# Patient Record
Sex: Female | Born: 1960
Health system: Southern US, Community
[De-identification: ages and names within clinical notes are randomized; demographics above are authoritative.]

## PROBLEM LIST (undated history)

## (undated) ENCOUNTER — Ambulatory Visit: Payer: Medicaid Other

## (undated) DIAGNOSIS — R569 Unspecified convulsions: Secondary | ICD-10-CM

## (undated) DIAGNOSIS — R609 Edema, unspecified: Secondary | ICD-10-CM

## (undated) DIAGNOSIS — I10 Essential (primary) hypertension: Secondary | ICD-10-CM

## (undated) DIAGNOSIS — E119 Type 2 diabetes mellitus without complications: Secondary | ICD-10-CM

## (undated) HISTORY — DX: Type 2 diabetes mellitus without complications: E11.9

## (undated) HISTORY — PX: BRAIN SURGERY: SHX531

## (undated) HISTORY — DX: Essential (primary) hypertension: I10

## (undated) HISTORY — DX: Edema, unspecified: R60.9

---

## 2018-01-14 ENCOUNTER — Encounter: Payer: Self-pay | Admitting: Family Medicine

## 2018-01-14 ENCOUNTER — Ambulatory Visit (INDEPENDENT_AMBULATORY_CARE_PROVIDER_SITE_OTHER): Payer: Self-pay | Admitting: Family Medicine

## 2018-01-14 VITALS — BP 138/76 | HR 80 | Temp 98.8°F | Ht 61.0 in | Wt 139.0 lb

## 2018-01-14 DIAGNOSIS — Z0000 Encounter for general adult medical examination without abnormal findings: Secondary | ICD-10-CM

## 2018-01-14 DIAGNOSIS — R600 Localized edema: Secondary | ICD-10-CM

## 2018-01-14 DIAGNOSIS — E1169 Type 2 diabetes mellitus with other specified complication: Secondary | ICD-10-CM

## 2018-01-14 DIAGNOSIS — Z794 Long term (current) use of insulin: Secondary | ICD-10-CM

## 2018-01-14 DIAGNOSIS — Z131 Encounter for screening for diabetes mellitus: Secondary | ICD-10-CM

## 2018-01-14 DIAGNOSIS — Z09 Encounter for follow-up examination after completed treatment for conditions other than malignant neoplasm: Secondary | ICD-10-CM

## 2018-01-14 DIAGNOSIS — R7309 Other abnormal glucose: Secondary | ICD-10-CM

## 2018-01-14 DIAGNOSIS — I10 Essential (primary) hypertension: Secondary | ICD-10-CM

## 2018-01-14 LAB — POCT URINALYSIS DIP (MANUAL ENTRY)
Bilirubin, UA: NEGATIVE
Glucose, UA: 500 mg/dL — AB
Ketones, POC UA: NEGATIVE mg/dL
Leukocytes, UA: NEGATIVE
Nitrite, UA: NEGATIVE
Protein Ur, POC: >=300 mg/dL — AB
Spec Grav, UA: 1.015 (ref 1.010–1.025)
Urobilinogen, UA: 0.2 E.U./dL
pH, UA: 7.5 (ref 5.0–8.0)

## 2018-01-14 LAB — GLUCOSE, POCT (MANUAL RESULT ENTRY)
POC Glucose: 478 mg/dl — AB (ref 70–99)
POC Glucose: 494 mg/dl — AB (ref 70–99)

## 2018-01-14 LAB — POCT GLYCOSYLATED HEMOGLOBIN (HGB A1C): Hemoglobin A1C: 14.3 % — AB (ref 4.0–5.6)

## 2018-01-14 MED ORDER — BLOOD GLUCOSE METER KIT: PACK | 0 refills | Status: DC

## 2018-01-14 MED ORDER — LISINOPRIL 10 MG PO TABS: 10.0000 mg | ORAL_TABLET | Freq: Every day | ORAL | 3 refills | Status: DC

## 2018-01-14 MED ORDER — INSULIN ASPART 100 UNIT/ML ~~LOC~~ SOLN: 15.0000 [IU] | Freq: Three times a day (TID) | SUBCUTANEOUS | 11 refills | Status: DC

## 2018-01-14 MED ORDER — CLONIDINE HCL 0.1 MG PO TABS
0.2000 mg | ORAL_TABLET | Freq: Once | ORAL | Status: AC
  Administered 2018-01-14: 0.2 mg via ORAL

## 2018-01-14 MED ORDER — GLIPIZIDE 10 MG PO TABS: 10.0000 mg | ORAL_TABLET | Freq: Two times a day (BID) | ORAL | 3 refills | Status: DC

## 2018-01-14 MED ORDER — AMLODIPINE BESYLATE 10 MG PO TABS: 10.0000 mg | ORAL_TABLET | Freq: Every day | ORAL | 3 refills | Status: DC

## 2018-01-14 MED ORDER — METFORMIN HCL 1000 MG PO TABS: 1000.0000 mg | ORAL_TABLET | Freq: Two times a day (BID) | ORAL | 3 refills | Status: DC

## 2018-01-14 MED ORDER — HYDROCHLOROTHIAZIDE 25 MG PO TABS: 25.0000 mg | ORAL_TABLET | Freq: Every day | ORAL | 3 refills | Status: DC

## 2018-01-14 MED ORDER — INSULIN LISPRO 100 UNIT/ML ~~LOC~~ SOLN
20.0000 [IU] | Freq: Once | SUBCUTANEOUS | Status: AC
  Administered 2018-01-14: 20 [IU] via SUBCUTANEOUS

## 2018-01-14 MED ORDER — INSULIN GLARGINE 100 UNIT/ML ~~LOC~~ SOLN: 50.0000 [IU] | Freq: Every day | SUBCUTANEOUS | 11 refills | Status: DC

## 2018-01-14 NOTE — Progress Notes (Signed)
New Patient--Establish Care  Subjective:    Patient ID: Jill Jefferson, female    DOB: 1960-06-24, 58 y.o.   MRN: [id number]  Chief Complaint  Patient presents with  . Establish Care  . Foot Swelling    bilateral    HPI  Jill Jefferson is a 58 year old female with a past medical history of Hypertension and Diabetes.   She is here today to establish care.    Current Status: Since her last office visit, she is doing well with no complaints. She states that she has recently relocated to [facility] from [facility] to take care of her elderly parents.   She states that she was previously taking medications for Diabetes and Hypertension 2 years ago, but discontinued taking because it was not working. She also states that she takes vitamins daily and eats healthy. She denies visual changes, chest pain, cough, shortness of breath, heart palpitations, and falls. She has occasionally headaches and dizziness with position changes. Denies severe headaches, confusion, seizures, double vision, and blurred vision, nausea and vomiting. She admits to frequent urination. She denies fatigue, excessive hunger, excessive thirst, weight gain, weight loss, and poor wound healing.   She denies fevers, chills, recent infections, weight loss, and night sweats. No reports of GI problems such as diarrhea, and constipation. She has no reports of blood in stools, dysuria and hematuria. No depression or anxiety reported. She denies pain today.   Review of Systems  Constitutional: Negative.   HENT: Negative.   Eyes: Positive for visual disturbance.  Respiratory: Negative.   Cardiovascular: Negative.   Gastrointestinal: Negative.   Endocrine: Positive for polyuria.  Genitourinary: Negative.   Musculoskeletal: Negative.   Skin: Negative.   Allergic/Immunologic: Negative.   Neurological: Positive for dizziness and headaches.  Hematological: Negative.   Psychiatric/Behavioral: Negative.    Objective:   Physical Exam Vitals signs  and nursing note reviewed.  Constitutional:      Appearance: Normal appearance. She is normal weight.  HENT:     Head: Normocephalic and atraumatic.     Nose: Nose normal.     Mouth/Throat:     Mouth: Mucous membranes are moist.     Pharynx: Oropharynx is clear.  Eyes:     Conjunctiva/sclera: Conjunctivae normal.  Neck:     Musculoskeletal: Neck supple.  Cardiovascular:     Rate and Rhythm: Normal rate and regular rhythm.     Pulses: Normal pulses.     Heart sounds: Normal heart sounds.  Pulmonary:     Effort: Pulmonary effort is normal.     Breath sounds: Normal breath sounds.  Abdominal:     General: Bowel sounds are normal.     Palpations: Abdomen is soft.  Musculoskeletal: Normal range of motion.     Right lower leg: Edema (1+) present.  Skin:    General: Skin is warm and dry.     Capillary Refill: Capillary refill takes less than 2 seconds.  Neurological:     General: No focal deficit present.     Mental Status: She is alert and oriented to person, place, and time.  Psychiatric:        Mood and Affect: Mood normal.        Behavior: Behavior normal.        Thought Content: Thought content normal.        Judgment: Judgment normal.    Assessment & Plan:   1. Hypertension, unspecified type Blood pressure is elevated today. Clonidine 0.3 mg given in  office today in effort to decrease blood pressure. We will initiate Lisinopril, HCTZ and Norvasc today. She will continue to decrease high sodium intake, excessive alcohol intake, increase potassium intake, smoking cessation, and increase physical activity of at least 30 minutes of cardio activity daily. She will continue to follow Heart Healthy or DASH diet. - cloNIDine (CATAPRES) tablet 0.2 mg  2. Type 2 diabetes mellitus with other specified complication, with long-term current use of insulin (HCC) We will initiate Lantus 50 units QHS, and Novolog 15 units with meals and QHS. Initiate Metformin and Glucotrol today.  She  will continue to decrease foods/beverages high in sugars and carbs and follow Heart Healthy or DASH diet. Increase physical activity to at least 30 minutes cardio exercise daily.   3. Bilateral lower extremity edema Stable. Not worsening. 1+ left leg, mild in right leg. We will initiate HCTZ today. She will continue OTC Potassium daily.   4. Elevated glucose Glucose elevated at 478 today. 20 units of Humalog given to patient in office today.  - POCT glucose (manual entry) - insulin lispro (HUMALOG) injection 20 Units - POCT glucose (manual entry)  5. Screening for diabetes mellitus Hgb A1c is increased at 14.3 today. We will initiate Insulin, Metformin, and Glucotrol as prescribed. She will monitor blood glucose levels before meals and QHS. She will continue to decrease foods/beverages high in sugars and carbs and follow Heart Healthy or DASH diet. Increase physical activity to at least 30 minutes cardio exercise daily.  - POCT glycosylated hemoglobin (Hb A1C) - POCT urinalysis dipstick  6. Healthcare maintenance - CBC with Differential - Comprehensive metabolic panel; Future - Lipid Panel - TSH - Vitamin D, 25-hydroxy - Vitamin B12 - Comprehensive metabolic panel  7. Follow up She will follow up in 1 week for assessment and compliance of Hypertension and Diabetes.   Meds ordered this encounter  Medications  . cloNIDine (CATAPRES) tablet 0.2 mg  . insulin lispro (HUMALOG) injection 20 Units  . insulin glargine (LANTUS) 100 UNIT/ML injection    Sig: Inject 0.5 mLs (50 Units total) into the skin at bedtime.    Dispense:  10 mL    Refill:  11  . metFORMIN (GLUCOPHAGE) 1000 MG tablet    Sig: Take 1 tablet (1,000 mg total) by mouth 2 (two) times daily with a meal.    Dispense:  180 tablet    Refill:  3  . glipiZIDE (GLUCOTROL) 10 MG tablet    Sig: Take 1 tablet (10 mg total) by mouth 2 (two) times daily before a meal.    Dispense:  60 tablet    Refill:  3  . insulin aspart  (NOVOLOG) 100 UNIT/ML injection    Sig: Inject 15 Units into the skin 3 (three) times daily before meals. 15 units for blood glucose levels greater than 125.    Dispense:  10 mL    Refill:  11  . lisinopril (PRINIVIL,ZESTRIL) 10 MG tablet    Sig: Take 1 tablet (10 mg total) by mouth daily.    Dispense:  30 tablet    Refill:  3  . amLODipine (NORVASC) 10 MG tablet    Sig: Take 1 tablet (10 mg total) by mouth daily.    Dispense:  30 tablet    Refill:  3  . blood glucose meter kit and supplies    Sig: Dispense based on patient and insurance preference. Use up to four times daily as directed. (FOR ICD-10 E10.9, E11.9).    Dispense:  1 each    Refill:  0    Order Specific Question:   Number of strips    Answer:   100    Order Specific Question:   Number of lancets    Answer:   100  . hydrochlorothiazide (HYDRODIURIL) 25 MG tablet    Sig: Take 1 tablet (25 mg total) by mouth daily.    Dispense:  30 tablet    Refill:  [place],  MSN, FNP-C [facility] [address] [place], [place] [address] [phone]

## 2018-01-14 NOTE — Patient Instructions (Signed)
Heart-Healthy Eating Plan Heart-healthy meal planning includes:  Eating less unhealthy fats.  Eating more healthy fats.  Making other changes in your diet. Talk with your doctor or a diet specialist (dietitian) to create an eating plan that is right for you. What is my plan? Your doctor may recommend an eating plan that includes:  Total fat: ______% or less of total calories a day.  Saturated fat: ______% or less of total calories a day.  Cholesterol: less than _________mg a day. What are tips for following this plan? Cooking Avoid frying your food. Try to bake, boil, grill, or broil it instead. You can also reduce fat by:  Removing the skin from poultry.  Removing all visible fats from meats.  Steaming vegetables in water or broth. Meal planning   At meals, divide your plate into four equal parts: ? Fill one-half of your plate with vegetables and green salads. ? Fill one-fourth of your plate with whole grains. ? Fill one-fourth of your plate with lean protein foods.  Eat 4-5 servings of vegetables per day. A serving of vegetables is: ? 1 cup of raw or cooked vegetables. ? 2 cups of raw leafy greens.  Eat 4-5 servings of fruit per day. A serving of fruit is: ? 1 medium whole fruit. ?  cup of dried fruit. ?  cup of fresh, frozen, or canned fruit. ?  cup of 100% fruit juice.  Eat more foods that have soluble fiber. These are apples, broccoli, carrots, beans, peas, and barley. Try to get 20-30 g of fiber per day.  Eat 4-5 servings of nuts, legumes, and seeds per week: ? 1 serving of dried beans or legumes equals  cup after being cooked. ? 1 serving of nuts is  cup. ? 1 serving of seeds equals 1 tablespoon. General information  Eat more home-cooked food. Eat less restaurant, buffet, and fast food.  Limit or avoid alcohol.  Limit foods that are high in starch and sugar.  Avoid fried foods.  Lose weight if you are overweight.  Keep track of how much salt  (sodium) you eat. This is important if you have high blood pressure. Ask your doctor to tell you more about this.  Try to add vegetarian meals each week. Fats  Choose healthy fats. These include olive oil and canola oil, flaxseeds, walnuts, almonds, and seeds.  Eat more omega-3 fats. These include salmon, mackerel, sardines, tuna, flaxseed oil, and ground flaxseeds. Try to eat fish at least 2 times each week.  Check food labels. Avoid foods with trans fats or high amounts of saturated fat.  Limit saturated fats. ? These are often found in animal products, such as meats, butter, and cream. ? These are also found in plant foods, such as palm oil, palm kernel oil, and coconut oil.  Avoid foods with partially hydrogenated oils in them. These have trans fats. Examples are stick margarine, some tub margarines, cookies, crackers, and other baked goods. What foods can I eat? Fruits All fresh, canned (in natural juice), or frozen fruits. Vegetables Fresh or frozen vegetables (raw, steamed, roasted, or grilled). Green salads. Grains Most grains. Choose whole wheat and whole grains most of the time. Rice and pasta, including brown rice and pastas made with whole wheat. Meats and other proteins Lean, well-trimmed beef, veal, pork, and lamb. Chicken and [place] without skin. All fish and shellfish. Wild duck, rabbit, pheasant, and venison. Egg whites or low-cholesterol egg substitutes. Dried beans, peas, lentils, and tofu. Seeds and most  nuts. Dairy Low-fat or nonfat cheeses, including ricotta and mozzarella. Skim or 1% milk that is liquid, powdered, or evaporated. Buttermilk that is made with low-fat milk. Nonfat or low-fat yogurt. Fats and oils Non-hydrogenated (trans-free) margarines. Vegetable oils, including soybean, sesame, sunflower, olive, peanut, safflower, corn, canola, and cottonseed. Salad dressings or mayonnaise made with a vegetable oil. Beverages Mineral water. Coffee and tea. Diet  carbonated beverages. Sweets and desserts Sherbet, gelatin, and fruit ice. Small amounts of dark chocolate. Limit all sweets and desserts. Seasonings and condiments All seasonings and condiments. The items listed above may not be a complete list of foods and drinks you can eat. Contact a dietitian for more options. What foods should I avoid? Fruits Canned fruit in heavy syrup. Fruit in cream or butter sauce. Fried fruit. Limit coconut. Vegetables Vegetables cooked in cheese, cream, or butter sauce. Fried vegetables. Grains Breads that are made with saturated or trans fats, oils, or whole milk. Croissants. Sweet rolls. Donuts. High-fat crackers, such as cheese crackers. Meats and other proteins Fatty meats, such as hot dogs, ribs, sausage, bacon, rib-eye roast or steak. High-fat deli meats, such as salami and bologna. Caviar. Domestic duck and goose. Organ meats, such as liver. Dairy Cream, sour cream, cream cheese, and creamed cottage cheese. Whole-milk cheeses. Whole or 2% milk that is liquid, evaporated, or condensed. Whole buttermilk. Cream sauce or high-fat cheese sauce. Yogurt that is made from whole milk. Fats and oils Meat fat, or shortening. Cocoa butter, hydrogenated oils, palm oil, coconut oil, palm kernel oil. Solid fats and shortenings, including bacon fat, salt pork, lard, and butter. Nondairy cream substitutes. Salad dressings with cheese or sour cream. Beverages Regular sodas and juice drinks with added sugar. Sweets and desserts Frosting. Pudding. Cookies. Cakes. Pies. Milk chocolate or white chocolate. Buttered syrups. Full-fat ice cream or ice cream drinks. The items listed above may not be a complete list of foods and drinks to avoid. Contact a dietitian for more information. Summary  Heart-healthy meal planning includes eating less unhealthy fats, eating more healthy fats, and making other changes in your diet.  Eat a balanced diet. This includes fruits and  vegetables, low-fat or nonfat dairy, lean protein, nuts and legumes, whole grains, and heart-healthy oils and fats. This information is not intended to replace advice given to you by your health care provider. Make sure you discuss any questions you have with your health care provider. Document Released: 06/26/2011 Document Revised: 02/01/2017 Document Reviewed: 02/01/2017 Elsevier Interactive Patient Education  2019 [place] DASH stands for "Dietary Approaches to Stop Hypertension." The DASH eating plan is a healthy eating plan that has been shown to reduce high blood pressure (hypertension). It may also reduce your risk for type 2 diabetes, heart disease, and stroke. The DASH eating plan may also help with weight loss. What are tips for following this plan?  General guidelines  Avoid eating more than 2,300 mg (milligrams) of salt (sodium) a day. If you have hypertension, you may need to reduce your sodium intake to 1,500 mg a day.  Limit alcohol intake to no more than 1 drink a day for nonpregnant women and 2 drinks a day for men. One drink equals 12 oz of beer, 5 oz of wine, or 1 oz of hard liquor.  Work with your health care provider to maintain a healthy body weight or to lose weight. Ask what an ideal weight is for you.  Get at least 30 minutes of exercise that causes  your heart to beat faster (aerobic exercise) most days of the week. Activities may include walking, swimming, or biking.  Work with your health care provider or diet and nutrition specialist (dietitian) to adjust your eating plan to your individual calorie needs. Reading food labels   Check food labels for the amount of sodium per serving. Choose foods with less than 5 percent of the Daily Value of sodium. Generally, foods with less than 300 mg of sodium per serving fit into this eating plan.  To find whole grains, look for the word "whole" as the first word in the ingredient list. Shopping  Buy  products labeled as "low-sodium" or "no salt added."  Buy fresh foods. Avoid canned foods and premade or frozen meals. Cooking  Avoid adding salt when cooking. Use salt-free seasonings or herbs instead of table salt or sea salt. Check with your health care provider or pharmacist before using salt substitutes.  Do not fry foods. Cook foods using healthy methods such as baking, boiling, grilling, and broiling instead.  Cook with heart-healthy oils, such as olive, canola, soybean, or sunflower oil. Meal planning  Eat a balanced diet that includes: ? 5 or more servings of fruits and vegetables each day. At each meal, try to fill half of your plate with fruits and vegetables. ? Up to 6-8 servings of whole grains each day. ? Less than 6 oz of lean meat, poultry, or fish each day. A 3-oz serving of meat is about the same size as a deck of cards. One egg equals 1 oz. ? 2 servings of low-fat dairy each day. ? A serving of nuts, seeds, or beans 5 times each week. ? Heart-healthy fats. Healthy fats called Omega-3 fatty acids are found in foods such as flaxseeds and coldwater fish, like sardines, salmon, and mackerel.  Limit how much you eat of the following: ? Canned or prepackaged foods. ? Food that is high in trans fat, such as fried foods. ? Food that is high in saturated fat, such as fatty meat. ? Sweets, desserts, sugary drinks, and other foods with added sugar. ? Full-fat dairy products.  Do not salt foods before eating.  Try to eat at least 2 vegetarian meals each week.  Eat more home-cooked food and less restaurant, buffet, and fast food.  When eating at a restaurant, ask that your food be prepared with less salt or no salt, if possible. What foods are recommended? The items listed may not be a complete list. Talk with your dietitian about what dietary choices are best for you. Grains Whole-grain or whole-wheat bread. Whole-grain or whole-wheat pasta. Brown rice. [place].  Bulgur. Whole-grain and low-sodium cereals. Pita bread. Low-fat, low-sodium crackers. Whole-wheat flour tortillas. Vegetables Fresh or frozen vegetables (raw, steamed, roasted, or grilled). Low-sodium or reduced-sodium tomato and vegetable juice. Low-sodium or reduced-sodium tomato sauce and tomato paste. Low-sodium or reduced-sodium canned vegetables. Fruits All fresh, dried, or frozen fruit. Canned fruit in natural juice (without added sugar). Meat and other protein foods Skinless chicken or [place]. Ground chicken or [place]. Pork with fat trimmed off. Fish and seafood. Egg whites. Dried beans, peas, or lentils. Unsalted nuts, nut butters, and seeds. Unsalted canned beans. Lean cuts of beef with fat trimmed off. Low-sodium, lean deli meat. Dairy Low-fat (1%) or fat-free (skim) milk. Fat-free, low-fat, or reduced-fat cheeses. Nonfat, low-sodium ricotta or cottage cheese. Low-fat or nonfat yogurt. Low-fat, low-sodium cheese. Fats and oils Soft margarine without trans fats. Vegetable oil. Low-fat, reduced-fat, or light mayonnaise  and salad dressings (reduced-sodium). Canola, safflower, olive, soybean, and sunflower oils. Avocado. Seasoning and other foods Herbs. Spices. Seasoning mixes without salt. Unsalted popcorn and pretzels. Fat-free sweets. What foods are not recommended? The items listed may not be a complete list. Talk with your dietitian about what dietary choices are best for you. Grains Baked goods made with fat, such as croissants, muffins, or some breads. Dry pasta or rice meal packs. Vegetables Creamed or fried vegetables. Vegetables in a cheese sauce. Regular canned vegetables (not low-sodium or reduced-sodium). Regular canned tomato sauce and paste (not low-sodium or reduced-sodium). Regular tomato and vegetable juice (not low-sodium or reduced-sodium). Angie Fava. Olives. Fruits Canned fruit in a light or heavy syrup. Fried fruit. Fruit in cream or butter sauce. Meat and other  protein foods Fatty cuts of meat. Ribs. Fried meat. Berniece Salines. Sausage. Bologna and other processed lunch meats. Salami. Fatback. Hotdogs. Bratwurst. Salted nuts and seeds. Canned beans with added salt. Canned or smoked fish. Whole eggs or egg yolks. Chicken or [place] with skin. Dairy Whole or 2% milk, cream, and half-and-half. Whole or full-fat cream cheese. Whole-fat or sweetened yogurt. Full-fat cheese. Nondairy creamers. Whipped toppings. Processed cheese and cheese spreads. Fats and oils Butter. Stick margarine. Lard. Shortening. Ghee. Bacon fat. Tropical oils, such as coconut, palm kernel, or palm oil. Seasoning and other foods Salted popcorn and pretzels. Onion salt, garlic salt, seasoned salt, table salt, and sea salt. Worcestershire sauce. Tartar sauce. Barbecue sauce. Teriyaki sauce. Soy sauce, including reduced-sodium. Steak sauce. Canned and packaged gravies. Fish sauce. Oyster sauce. Cocktail sauce. Horseradish that you find on the shelf. Ketchup. Mustard. Meat flavorings and tenderizers. Bouillon cubes. Hot sauce and Tabasco sauce. Premade or packaged marinades. Premade or packaged taco seasonings. Relishes. Regular salad dressings. Where to find more information:  National Heart, Lung, and [place]: https://wilson-eaton.com/  American Heart Association: www.heart.org Summary  The DASH eating plan is a healthy eating plan that has been shown to reduce high blood pressure (hypertension). It may also reduce your risk for type 2 diabetes, heart disease, and stroke.  With the DASH eating plan, you should limit salt (sodium) intake to 2,300 mg a day. If you have hypertension, you may need to reduce your sodium intake to 1,500 mg a day.  When on the DASH eating plan, aim to eat more fresh fruits and vegetables, whole grains, lean proteins, low-fat dairy, and heart-healthy fats.  Work with your health care provider or diet and nutrition specialist (dietitian) to adjust your eating plan to your  individual calorie needs. This information is not intended to replace advice given to you by your health care provider. Make sure you discuss any questions you have with your health care provider. Document Released: 12/14/2010 Document Revised: 12/19/2015 Document Reviewed: 12/19/2015 Elsevier Interactive Patient Education  2019 [employer]. Hypertension Hypertension, commonly called high blood pressure, is when the force of blood pumping through the arteries is too strong. The arteries are the blood vessels that carry blood from the heart throughout the body. Hypertension forces the heart to work harder to pump blood and may cause arteries to become narrow or stiff. Having untreated or uncontrolled hypertension can cause heart attacks, strokes, kidney disease, and other problems. A blood pressure reading consists of a higher number over a lower number. Ideally, your blood pressure should be below 120/80. The first ("top") number is called the systolic pressure. It is a measure of the pressure in your arteries as your heart beats. The second ("bottom") number is  called the diastolic pressure. It is a measure of the pressure in your arteries as the heart relaxes. What are the causes? The cause of this condition is not known. What increases the risk? Some risk factors for high blood pressure are under your control. Others are not. Factors you can change  Smoking.  Having type 2 diabetes mellitus, high cholesterol, or both.  Not getting enough exercise or physical activity.  Being overweight.  Having too much fat, sugar, calories, or salt (sodium) in your diet.  Drinking too much alcohol. Factors that are difficult or impossible to change  Having chronic kidney disease.  Having a family history of high blood pressure.  Age. Risk increases with age.  Race. You may be at higher risk if you are African-American.  Gender. Men are at higher risk than women before age 60. After age 49,  women are at higher risk than men.  Having obstructive sleep apnea.  Stress. What are the signs or symptoms? Extremely high blood pressure (hypertensive crisis) may cause:  Headache.  Anxiety.  Shortness of breath.  Nosebleed.  Nausea and vomiting.  Severe chest pain.  Jerky movements you cannot control (seizures). How is this diagnosed? This condition is diagnosed by measuring your blood pressure while you are seated, with your arm resting on a surface. The cuff of the blood pressure monitor will be placed directly against the skin of your upper arm at the level of your heart. It should be measured at least twice using the same arm. Certain conditions can cause a difference in blood pressure between your right and left arms. Certain factors can cause blood pressure readings to be lower or higher than normal (elevated) for a short period of time:  When your blood pressure is higher when you are in a health care provider's office than when you are at home, this is called white coat hypertension. Most people with this condition do not need medicines.  When your blood pressure is higher at home than when you are in a health care provider's office, this is called masked hypertension. Most people with this condition may need medicines to control blood pressure. If you have a high blood pressure reading during one visit or you have normal blood pressure with other risk factors:  You may be asked to return on a different day to have your blood pressure checked again.  You may be asked to monitor your blood pressure at home for 1 week or longer. If you are diagnosed with hypertension, you may have other blood or imaging tests to help your health care provider understand your overall risk for other conditions. How is this treated? This condition is treated by making healthy lifestyle changes, such as eating healthy foods, exercising more, and reducing your alcohol intake. Your health care  provider may prescribe medicine if lifestyle changes are not enough to get your blood pressure under control, and if:  Your systolic blood pressure is above 130.  Your diastolic blood pressure is above 80. Your personal target blood pressure may vary depending on your medical conditions, your age, and other factors. Follow these instructions at home: Eating and drinking   Eat a diet that is high in fiber and potassium, and low in sodium, added sugar, and fat. An example eating plan is called the DASH (Dietary Approaches to Stop Hypertension) diet. To eat this way: ? Eat plenty of fresh fruits and vegetables. Try to fill half of your plate at each meal with fruits  and vegetables. ? Eat whole grains, such as whole wheat pasta, brown rice, or whole grain bread. Fill about one quarter of your plate with whole grains. ? Eat or drink low-fat dairy products, such as skim milk or low-fat yogurt. ? Avoid fatty cuts of meat, processed or cured meats, and poultry with skin. Fill about one quarter of your plate with lean proteins, such as fish, chicken without skin, beans, eggs, and tofu. ? Avoid premade and processed foods. These tend to be higher in sodium, added sugar, and fat.  Reduce your daily sodium intake. Most people with hypertension should eat less than 1,500 mg of sodium a day.  Limit alcohol intake to no more than 1 drink a day for nonpregnant women and 2 drinks a day for men. One drink equals 12 oz of beer, 5 oz of wine, or 1 oz of hard liquor. Lifestyle   Work with your health care provider to maintain a healthy body weight or to lose weight. Ask what an ideal weight is for you.  Get at least 30 minutes of exercise that causes your heart to beat faster (aerobic exercise) most days of the week. Activities may include walking, swimming, or biking.  Include exercise to strengthen your muscles (resistance exercise), such as pilates or lifting weights, as part of your weekly exercise  routine. Try to do these types of exercises for 30 minutes at least 3 days a week.  Do not use any products that contain nicotine or tobacco, such as cigarettes and e-cigarettes. If you need help quitting, ask your health care provider.  Monitor your blood pressure at home as told by your health care provider.  Keep all follow-up visits as told by your health care provider. This is important. Medicines  Take over-the-counter and prescription medicines only as told by your health care provider. Follow directions carefully. Blood pressure medicines must be taken as prescribed.  Do not skip doses of blood pressure medicine. Doing this puts you at risk for problems and can make the medicine less effective.  Ask your health care provider about side effects or reactions to medicines that you should watch for. Contact a health care provider if:  You think you are having a reaction to a medicine you are taking.  You have headaches that keep coming back (recurring).  You feel dizzy.  You have swelling in your ankles.  You have trouble with your vision. Get help right away if:  You develop a severe headache or confusion.  You have unusual weakness or numbness.  You feel faint.  You have severe pain in your chest or abdomen.  You vomit repeatedly.  You have trouble breathing. Summary  Hypertension is when the force of blood pumping through your arteries is too strong. If this condition is not controlled, it may put you at risk for serious complications.  Your personal target blood pressure may vary depending on your medical conditions, your age, and other factors. For most people, a normal blood pressure is less than 120/80.  Hypertension is treated with lifestyle changes, medicines, or a combination of both. Lifestyle changes include weight loss, eating a healthy, low-sodium diet, exercising more, and limiting alcohol. This information is not intended to replace advice given to you  by your health care provider. Make sure you discuss any questions you have with your health care provider. Document Released: 12/25/2004 Document Revised: 11/23/2015 Document Reviewed: 11/23/2015 Elsevier Interactive Patient Education  2019 [employer] Lisinopril tablets What is this medicine?  LISINOPRIL (lyse [place] oh pril) is an ACE inhibitor. This medicine is used to treat high blood pressure and heart failure. It is also used to protect the heart immediately after a heart attack. This medicine may be used for other purposes; ask your health care provider or pharmacist if you have questions. COMMON BRAND NAME(S): Prinivil, Zestril What should I tell my health care provider before I take this medicine? They need to know if you have any of these conditions: -diabetes -heart or blood vessel disease -kidney disease -low blood pressure -previous swelling of the tongue, face, or lips with difficulty breathing, difficulty swallowing, hoarseness, or tightening of the throat -an unusual or allergic reaction to lisinopril, other ACE inhibitors, insect venom, foods, dyes, or preservatives -pregnant or trying to get pregnant -breast-feeding How should I use this medicine? Take this medicine by mouth with a glass of water. Follow the directions on your prescription label. You may take this medicine with or without food. If it upsets your stomach, take it with food. Take your medicine at regular intervals. Do not take it more often than directed. Do not stop taking except on your doctor's advice. Talk to your pediatrician regarding the use of this medicine in children. Special care may be needed. While this drug may be prescribed for children as young as 55 years of age for selected conditions, precautions do apply. Overdosage: If you think you have taken too much of this medicine contact a poison control center or emergency room at once. NOTE: This medicine is only for you. Do not share this medicine with  others. What if I miss a dose? If you miss a dose, take it as soon as you can. If it is almost time for your next dose, take only that dose. Do not take double or extra doses. What may interact with this medicine? Do not take this medicine with any of the following medications: -hymenoptera venom -sacubitril; valsartan This medicines may also interact with the following medications: -aliskiren -angiotensin receptor blockers, like losartan or valsartan -certain medicines for diabetes -diuretics -everolimus -gold compounds -lithium -NSAIDs, medicines for pain and inflammation, like ibuprofen or naproxen -potassium salts or supplements -salt substitutes -sirolimus -temsirolimus This list may not describe all possible interactions. Give your health care provider a list of all the medicines, herbs, non-prescription drugs, or dietary supplements you use. Also tell them if you smoke, drink alcohol, or use illegal drugs. Some items may interact with your medicine. What should I watch for while using this medicine? Visit your doctor or health care professional for regular check ups. Check your blood pressure as directed. Ask your doctor what your blood pressure should be, and when you should contact him or her. Do not treat yourself for coughs, colds, or pain while you are using this medicine without asking your doctor or health care professional for advice. Some ingredients may increase your blood pressure. Women should inform their doctor if they wish to become pregnant or think they might be pregnant. There is a potential for serious side effects to an unborn child. Talk to your health care professional or pharmacist for more information. Check with your doctor or health care professional if you get an attack of severe diarrhea, nausea and vomiting, or if you sweat a lot. The loss of too much body fluid can make it dangerous for you to take this medicine. You may get drowsy or dizzy. Do not  drive, use machinery, or do anything that needs mental alertness until  you know how this drug affects you. Do not stand or sit up quickly, especially if you are an older patient. This reduces the risk of dizzy or fainting spells. Alcohol can make you more drowsy and dizzy. Avoid alcoholic drinks. Avoid salt substitutes unless you are told otherwise by your doctor or health care professional. What side effects may I notice from receiving this medicine? Side effects that you should report to your doctor or health care professional as soon as possible: -allergic reactions like skin rash, itching or hives, swelling of the hands, feet, face, lips, throat, or tongue -breathing problems -signs and symptoms of kidney injury like trouble passing urine or change in the amount of urine -signs and symptoms of increased potassium like muscle weakness; chest pain; or fast, irregular heartbeat -signs and symptoms of liver injury like dark yellow or brown urine; general ill feeling or flu-like symptoms; light-colored stools; loss of appetite; nausea; right upper belly pain; unusually weak or tired; yellowing of the eyes or skin -signs and symptoms of low blood pressure like dizziness; feeling faint or lightheaded, falls; unusually weak or tired -stomach pain with or without nausea and vomiting Side effects that usually do not require medical attention (report to your doctor or health care professional if they continue or are bothersome): -changes in taste -cough -dizziness -fever -headache -sensitivity to light This list may not describe all possible side effects. Call your doctor for medical advice about side effects. You may report side effects to FDA at 1-800-FDA-1088. Where should I keep my medicine? Keep out of the reach of children. Store at room temperature between 15 and 30 degrees C (59 and 86 degrees F). Protect from moisture. Keep container tightly closed. Throw away any unused medicine after the  expiration date. NOTE: This sheet is a summary. It may not cover all possible information. If you have questions about this medicine, talk to your doctor, pharmacist, or health care provider.  2019 [employer] Standard (2015-02-14 12:52:35) Amlodipine tablets What is this medicine? AMLODIPINE (am LOE di peen) is a calcium-channel blocker. It affects the amount of calcium found in your heart and muscle cells. This relaxes your blood vessels, which can reduce the amount of work the heart has to do. This medicine is used to lower high blood pressure. It is also used to prevent chest pain. This medicine may be used for other purposes; ask your health care provider or pharmacist if you have questions. COMMON BRAND NAME(S): Norvasc What should I tell my health care provider before I take this medicine? They need to know if you have any of these conditions: -heart disease -liver disease -an unusual or allergic reaction to amlodipine, other medicines, foods, dyes, or preservatives -pregnant or trying to get pregnant -breast-feeding How should I use this medicine? Take this medicine by mouth with a glass of water. Follow the directions on the prescription label. You can take it with or without food. If it upsets your stomach, take it with food. Take your medicine at regular intervals. Do not take it more often than directed. Do not stop taking except on your doctor's advice. Talk to your pediatrician regarding the use of this medicine in children. While this drug may be prescribed for children as young as 6 years for selected conditions, precautions do apply. Patients over 73 years of age may have a stronger reaction and need a smaller dose. Overdosage: If you think you have taken too much of this medicine contact a poison control center or  emergency room at once. NOTE: This medicine is only for you. Do not share this medicine with others. What if I miss a dose? If you miss a dose, take it as soon as  you can. If it is almost time for your next dose, take only that dose. Do not take double or extra doses. What may interact with this medicine? Do not take this medicine with any of the following medications: -tranylcypromine This medicine may also interact with the following medications: -clarithromycin -cyclosporine -diltiazem -itraconazole -simvastatin -tacrolimus This list may not describe all possible interactions. Give your health care provider a list of all the medicines, herbs, non-prescription drugs, or dietary supplements you use. Also tell them if you smoke, drink alcohol, or use illegal drugs. Some items may interact with your medicine. What should I watch for while using this medicine? Visit your healthcare professional for regular checks on your progress. Check your blood pressure as directed. Ask your healthcare professional what your blood pressure should be and when you should contact him or her. Do not treat yourself for coughs, colds, or pain while you are using this medicine without asking your healthcare professional for advice. Some medicines may increase your blood pressure. You may get dizzy. Do not drive, use machinery, or do anything that needs mental alertness until you know how this medicine affects you. Do not stand or sit up quickly, especially if you are an older patient. This reduces the risk of dizzy or fainting spells. Avoid alcoholic drinks; they can make you dizzier. What side effects may I notice from receiving this medicine? Side effects that you should report to your doctor or health care professional as soon as possible: -allergic reactions like skin rash, itching or hives; swelling of the face, lips, or tongue -fast, irregular heartbeat -signs and symptoms of low blood pressure like dizziness; feeling faint or lightheaded, falls; unusually weak or tired -swelling of ankles, feet, hands Side effects that usually do not require medical attention (report  these to your doctor or health care professional if they continue or are bothersome): -dry mouth -facial flushing -headache -stomach pain -tiredness This list may not describe all possible side effects. Call your doctor for medical advice about side effects. You may report side effects to FDA at 1-800-FDA-1088. Where should I keep my medicine? Keep out of the reach of children. Store at room temperature between 59 and 86 degrees F (15 and 30 degrees C). Throw away any unused medicine after the expiration date. NOTE: This sheet is a summary. It may not cover all possible information. If you have questions about this medicine, talk to your doctor, pharmacist, or health care provider.  2019 [employer] Standard (2017-07-19 15:07:10) Hydrochlorothiazide, HCTZ capsules or tablets What is this medicine? HYDROCHLOROTHIAZIDE (hye droe klor oh THYE a zide) is a diuretic. It increases the amount of urine passed, which causes the body to lose salt and water. This medicine is used to treat high blood pressure. It is also reduces the swelling and water retention caused by various medical conditions, such as heart, liver, or kidney disease. This medicine may be used for other purposes; ask your health care provider or pharmacist if you have questions. COMMON BRAND NAME(S): Esidrix, Ezide, HydroDIURIL, Microzide, Oretic, Zide What should I tell my health care provider before I take this medicine? They need to know if you have any of these conditions: -diabetes -gout -immune system problems, like lupus -kidney disease or kidney stones -liver disease -pancreatitis -small amount of urine  or difficulty passing urine -an unusual or allergic reaction to hydrochlorothiazide, sulfa drugs, other medicines, foods, dyes, or preservatives -pregnant or trying to get pregnant -breast-feeding How should I use this medicine? Take this medicine by mouth with a glass of water. Follow the directions on the  prescription label. Take your medicine at regular intervals. Remember that you will need to pass urine frequently after taking this medicine. Do not take your doses at a time of day that will cause you problems. Do not stop taking your medicine unless your doctor tells you to. Talk to your pediatrician regarding the use of this medicine in children. Special care may be needed. Overdosage: If you think you have taken too much of this medicine contact a poison control center or emergency room at once. NOTE: This medicine is only for you. Do not share this medicine with others. What if I miss a dose? If you miss a dose, take it as soon as you can. If it is almost time for your next dose, take only that dose. Do not take double or extra doses. What may interact with this medicine? -cholestyramine -colestipol -digoxin -dofetilide -lithium -medicines for blood pressure -medicines for diabetes -medicines that relax muscles for surgery -other diuretics -steroid medicines like prednisone or cortisone This list may not describe all possible interactions. Give your health care provider a list of all the medicines, herbs, non-prescription drugs, or dietary supplements you use. Also tell them if you smoke, drink alcohol, or use illegal drugs. Some items may interact with your medicine. What should I watch for while using this medicine? Visit your doctor or health care professional for regular checks on your progress. Check your blood pressure as directed. Ask your doctor or health care professional what your blood pressure should be and when you should contact him or her. You may need to be on a special diet while taking this medicine. Ask your doctor. Check with your doctor or health care professional if you get an attack of severe diarrhea, nausea and vomiting, or if you sweat a lot. The loss of too much body fluid can make it dangerous for you to take this medicine. You may get drowsy or dizzy. Do not  drive, use machinery, or do anything that needs mental alertness until you know how this medicine affects you. Do not stand or sit up quickly, especially if you are an older patient. This reduces the risk of dizzy or fainting spells. Alcohol may interfere with the effect of this medicine. Avoid alcoholic drinks. This medicine may affect your blood sugar level. If you have diabetes, check with your doctor or health care professional before changing the dose of your diabetic medicine. This medicine can make you more sensitive to the sun. Keep out of the sun. If you cannot avoid being in the sun, wear protective clothing and use sunscreen. Do not use sun lamps or tanning beds/booths. What side effects may I notice from receiving this medicine? Side effects that you should report to your doctor or health care professional as soon as possible: -allergic reactions such as skin rash or itching, hives, swelling of the lips, mouth, tongue, or throat -changes in vision -chest pain -eye pain -fast or irregular heartbeat -feeling faint or lightheaded, falls -gout attack -muscle pain or cramps -pain or difficulty when passing urine -pain, tingling, numbness in the hands or feet -redness, blistering, peeling or loosening of the skin, including inside the mouth -unusually weak or tired Side effects that usually do  not require medical attention (report to your doctor or health care professional if they continue or are bothersome): -change in sex drive or performance -dry mouth -headache -stomach upset This list may not describe all possible side effects. Call your doctor for medical advice about side effects. You may report side effects to FDA at 1-800-FDA-1088. Where should I keep my medicine? Keep out of the reach of children. Store at room temperature between 15 and 30 degrees C (59 and 86 degrees F). Do not freeze. Protect from light and moisture. Keep container closed tightly. Throw away any unused  medicine after the expiration date. NOTE: This sheet is a summary. It may not cover all possible information. If you have questions about this medicine, talk to your doctor, pharmacist, or health care provider.  2019 [employer] Standard (2009-08-19 12:57:37) Metformin tablets What is this medicine? METFORMIN (met FOR min) is used to treat type 2 diabetes. It helps to control blood sugar. Treatment is combined with diet and exercise. This medicine can be used alone or with other medicines for diabetes. This medicine may be used for other purposes; ask your health care provider or pharmacist if you have questions. COMMON BRAND NAME(S): Glucophage What should I tell my health care provider before I take this medicine? They need to know if you have any of these conditions: -anemia -dehydration -heart disease -frequently drink alcohol-containing beverages -kidney disease -liver disease -polycystic ovary syndrome -serious infection or injury -vomiting -an unusual or allergic reaction to metformin, other medicines, foods, dyes, or preservatives -pregnant or trying to get pregnant -breast-feeding How should I use this medicine? Take this medicine by mouth with a glass of water. Follow the directions on the prescription label. Take this medicine with food. Take your medicine at regular intervals. Do not take your medicine more often than directed. Do not stop taking except on your doctor's advice. Talk to your pediatrician regarding the use of this medicine in children. While this drug may be prescribed for children as young as 36 years of age for selected conditions, precautions do apply. Overdosage: If you think you have taken too much of this medicine contact a poison control center or emergency room at once. NOTE: This medicine is only for you. Do not share this medicine with others. What if I miss a dose? If you miss a dose, take it as soon as you can. If it is almost time for your next  dose, take only that dose. Do not take double or extra doses. What may interact with this medicine? Do not take this medicine with any of the following medications: -certain contrast medicines given before X-rays, CT scans, MRI, or other procedures -dofetilide This medicine may also interact with the following medications: -acetazolamide -alcohol -certain antivirals for HIV or hepatitis -certain medicines for blood pressure, heart disease, irregular heart beat -cimetidine -dichlorphenamide -digoxin -diuretics -female hormones, like estrogens or progestins and birth control pills -glycopyrrolate -isoniazid -lamotrigine -memantine -methazolamide -metoclopramide -midodrine -niacin -phenothiazines like chlorpromazine, mesoridazine, prochlorperazine, thioridazine -phenytoin -ranolazine -steroid medicines like prednisone or cortisone -stimulant medicines for attention disorders, weight loss, or to stay awake -thyroid medicines -topiramate -trospium -vandetanib -zonisamide This list may not describe all possible interactions. Give your health care provider a list of all the medicines, herbs, non-prescription drugs, or dietary supplements you use. Also tell them if you smoke, drink alcohol, or use illegal drugs. Some items may interact with your medicine. What should I watch for while using this medicine? Visit your doctor or health  care professional for regular checks on your progress. A test called the HbA1C (A1C) will be monitored. This is a simple blood test. It measures your blood sugar control over the last 2 to 3 months. You will receive this test every 3 to 6 months. Learn how to check your blood sugar. Learn the symptoms of low and high blood sugar and how to manage them. Always carry a quick-source of sugar with you in case you have symptoms of low blood sugar. Examples include hard sugar candy or glucose tablets. Make sure others know that you can choke if you eat or drink  when you develop serious symptoms of low blood sugar, such as seizures or unconsciousness. They must get medical help at once. Tell your doctor or health care professional if you have high blood sugar. You might need to change the dose of your medicine. If you are sick or exercising more than usual, you might need to change the dose of your medicine. Do not skip meals. Ask your doctor or health care professional if you should avoid alcohol. Many nonprescription cough and cold products contain sugar or alcohol. These can affect blood sugar. This medicine may cause ovulation in premenopausal women who do not have regular monthly periods. This may increase your chances of becoming pregnant. You should not take this medicine if you become pregnant or think you may be pregnant. Talk with your doctor or health care professional about your birth control options while taking this medicine. Contact your doctor or health care professional right away if you think you are pregnant. If you are going to need surgery, a MRI, CT scan, or other procedure, tell your doctor that you are taking this medicine. You may need to stop taking this medicine before the procedure. Wear a medical ID bracelet or chain, and carry a card that describes your disease and details of your medicine and dosage times. This medicine may cause a decrease in folic acid and vitamin B12. You should make sure that you get enough vitamins while you are taking this medicine. Discuss the foods you eat and the vitamins you take with your health care professional. What side effects may I notice from receiving this medicine? Side effects that you should report to your doctor or health care professional as soon as possible: -allergic reactions like skin rash, itching or hives, swelling of the face, lips, or tongue -breathing problems -feeling faint or lightheaded, falls -muscle aches or pains -signs and symptoms of low blood sugar such as feeling anxious,  confusion, dizziness, increased hunger, unusually weak or tired, sweating, shakiness, cold, irritable, headache, blurred vision, fast heartbeat, loss of consciousness -slow or irregular heartbeat -unusual stomach pain or discomfort -unusually tired or weak Side effects that usually do not require medical attention (report to your doctor or health care professional if they continue or are bothersome): -diarrhea -headache -heartburn -metallic taste in mouth -nausea -stomach gas, upset This list may not describe all possible side effects. Call your doctor for medical advice about side effects. You may report side effects to FDA at 1-800-FDA-1088. Where should I keep my medicine? Keep out of the reach of children. Store at room temperature between 15 and 30 degrees C (59 and 86 degrees F). Protect from moisture and light. Throw away any unused medicine after the expiration date. NOTE: This sheet is a summary. It may not cover all possible information. If you have questions about this medicine, talk to your doctor, pharmacist, or health care provider.  2019 [employer] (2017-01-31 19:15:19) Glipizide tablets What is this medicine? GLIPIZIDE (GLIP i zide) helps to treat type 2 diabetes. Treatment is combined with diet and exercise. The medicine helps your body to use insulin better. This medicine may be used for other purposes; ask your health care provider or pharmacist if you have questions. COMMON BRAND NAME(S): Glucotrol What should I tell my health care provider before I take this medicine? They need to know if you have any of these conditions: -diabetic ketoacidosis -glucose-6-phosphate dehydrogenase deficiency -heart disease -kidney disease -liver disease -porphyria -severe infection or injury -thyroid disease -an unusual or allergic reaction to glipizide, sulfa drugs, other medicines, foods, dyes, or preservatives -pregnant or trying to get  pregnant -breast-feeding How should I use this medicine? Take this medicine by mouth. Swallow with a drink of water. Do not take with food. Take it 30 minutes before a meal. Follow the directions on the prescription label. If you take this medicine once a day, take it 30 minutes before breakfast. Take your doses at the same time each day. Do not take more often than directed. Talk to your pediatrician regarding the use of this medicine in children. Special care may be needed. Elderly patients over 46 years old may have a stronger reaction and need a smaller dose. Overdosage: If you think you have taken too much of this medicine contact a poison control center or emergency room at once. NOTE: This medicine is only for you. Do not share this medicine with others. What if I miss a dose? If you miss a dose, take it as soon as you can. If it is almost time for your next dose, take only that dose. Do not take double or extra doses. What may interact with this medicine? -bosentan -chloramphenicol -cisapride -clarithromycin -medicines for fungal or yeast infections -metoclopramide -probenecid -warfarin Many medications may cause an increase or decrease in blood sugar, these include: -alcohol containing beverages -aspirin and aspirin-like drugs -chloramphenicol -chromium -diuretics -female hormones, like estrogens or progestins and birth control pills -heart medicines -isoniazid -female hormones or anabolic steroids -medicines for weight loss -medicines for allergies, asthma, cold, or cough -medicines for mental problems -medicines called MAO Inhibitors like Nardil, Parnate, Marplan, Eldepryl -niacin -NSAIDs, medicines for pain and inflammation, like ibuprofen or naproxen -pentamidine -phenytoin -probenecid -quinolone antibiotics like ciprofloxacin, levofloxacin, ofloxacin -some herbal dietary supplements -steroid medicines like prednisone or cortisone -thyroid medicine This list may  not describe all possible interactions. Give your health care provider a list of all the medicines, herbs, non-prescription drugs, or dietary supplements you use. Also tell them if you smoke, drink alcohol, or use illegal drugs. Some items may interact with your medicine. What should I watch for while using this medicine? Visit your doctor or health care professional for regular checks on your progress. A test called the HbA1C (A1C) will be monitored. This is a simple blood test. It measures your blood sugar control over the last 2 to 3 months. You will receive this test every 3 to 6 months. Learn how to check your blood sugar. Learn the symptoms of low and high blood sugar and how to manage them. Always carry a quick-source of sugar with you in case you have symptoms of low blood sugar. Examples include hard sugar candy or glucose tablets. Make sure others know that you can choke if you eat or drink when you develop serious symptoms of low blood sugar, such as seizures or unconsciousness. They must get medical help at  once. Tell your doctor or health care professional if you have high blood sugar. You might need to change the dose of your medicine. If you are sick or exercising more than usual, you might need to change the dose of your medicine. Do not skip meals. Ask your doctor or health care professional if you should avoid alcohol. Many nonprescription cough and cold products contain sugar or alcohol. These can affect blood sugar. This medicine can make you more sensitive to the sun. Keep out of the sun. If you cannot avoid being in the sun, wear protective clothing and use sunscreen. Do not use sun lamps or tanning beds/booths. Wear a medical ID bracelet or chain, and carry a card that describes your disease and details of your medicine and dosage times. What side effects may I notice from receiving this medicine? Side effects that you should report to your doctor or health care professional as soon  as possible: -allergic reactions like skin rash, itching or hives, swelling of the face, lips, or tongue -breathing problems -dark urine -fever, chills, sore throat -signs and symptoms of low blood sugar such as feeling anxious, confusion, dizziness, increased hunger, unusually weak or tired, sweating, shakiness, cold, irritable, headache, blurred vision, fast heartbeat, loss of consciousness -unusual bleeding or bruising -yellowing of the eyes or skin Side effects that usually do not require medical attention (report to your doctor or health care professional if they continue or are bothersome): -diarrhea -dizziness -headache -heartburn -nausea -stomach gas This list may not describe all possible side effects. Call your doctor for medical advice about side effects. You may report side effects to FDA at 1-800-FDA-1088. Where should I keep my medicine? Keep out of the reach of children. Store at room temperature below 30 degrees C (86 degrees F). Throw away any unused medicine after the expiration date. NOTE: This sheet is a summary. It may not cover all possible information. If you have questions about this medicine, talk to your doctor, pharmacist, or health care provider.  2019 Elsevier/Gold Standard (2012-04-09 14:42:46) Insulin Glargine injection What is this medicine? INSULIN GLARGINE (IN su lin GLAR geen) is a human-made form of insulin. This drug lowers the amount of sugar in your blood. It is a long-acting insulin that is usually given once a day. This medicine may be used for other purposes; ask your health care provider or pharmacist if you have questions. COMMON BRAND NAME(S): BASAGLAR, Lantus, Lantus SoloStar, Toujeo Max SoloStar, Toujeo SoloStar What should I tell my health care provider before I take this medicine? They need to know if you have any of these conditions: -episodes of low blood sugar -eye disease, vision problems -kidney disease -liver disease -an unusual  or allergic reaction to insulin, metacresol, other medicines, foods, dyes, or preservatives -pregnant or trying to get pregnant -breast-feeding How should I use this medicine? This medicine is for injection under the skin. Use this medicine at the same time each day. Use exactly as directed. This insulin should never be mixed in the same syringe with other insulins before injection. Do not vigorously shake before use. You will be taught how to use this medicine and how to adjust doses for activities and illness. Do not use more insulin than prescribed. Always check the appearance of your insulin before using it. This medicine should be clear and colorless like water. Do not use it if it is cloudy, thickened, colored, or has solid particles in it. If you use an insulin pen, be sure to  take off the outer needle cover before using the dose. It is important that you put your used needles and syringes in a special sharps container. Do not put them in a trash can. If you do not have a sharps container, call your pharmacist or healthcare provider to get one. Talk to your pediatrician regarding the use of this medicine in children. Special care may be needed. Overdosage: If you think you have taken too much of this medicine contact a poison control center or emergency room at once. NOTE: This medicine is only for you. Do not share this medicine with others. What if I miss a dose? It is important not to miss a dose. Your health care professional or doctor should discuss a plan for missed doses with you. If you do miss a dose, follow their plan. Do not take double doses. What may interact with this medicine? -other medicines for diabetes Many medications may cause changes in blood sugar, these include: -alcohol containing beverages -antiviral medicines for HIV or AIDS -aspirin and aspirin-like drugs -certain medicines for blood pressure, heart disease, irregular heart beat -chromium -diuretics -female  hormones, such as estrogens or progestins, birth control pills -fenofibrate -gemfibrozil -isoniazid -lanreotide -female hormones or anabolic steroids -MAOIs like Carbex, Eldepryl, Marplan, Nardil, and Parnate -medicines for weight loss -medicines for allergies, asthma, cold, or cough -medicines for depression, anxiety, or psychotic disturbances -niacin -nicotine -NSAIDs, medicines for pain and inflammation, like ibuprofen or naproxen -octreotide -pasireotide -pentamidine -phenytoin -probenecid -quinolone antibiotics such as ciprofloxacin, levofloxacin, ofloxacin -some herbal dietary supplements -steroid medicines such as prednisone or cortisone -sulfamethoxazole; trimethoprim -thyroid hormones Some medications can hide the warning symptoms of low blood sugar (hypoglycemia). You may need to monitor your blood sugar more closely if you are taking one of these medications. These include: -beta-blockers, often used for high blood pressure or heart problems (examples include atenolol, metoprolol, propranolol) -clonidine -guanethidine -reserpine This list may not describe all possible interactions. Give your health care provider a list of all the medicines, herbs, non-prescription drugs, or dietary supplements you use. Also tell them if you smoke, drink alcohol, or use illegal drugs. Some items may interact with your medicine. What should I watch for while using this medicine? Visit your health care professional or doctor for regular checks on your progress. Do not drive, use machinery, or do anything that needs mental alertness until you know how this medicine affects you. Alcohol may interfere with the effect of this medicine. Avoid alcoholic drinks. A test called the HbA1C (A1C) will be monitored. This is a simple blood test. It measures your blood sugar control over the last 2 to 3 months. You will receive this test every 3 to 6 months. Learn how to check your blood sugar. Learn the  symptoms of low and high blood sugar and how to manage them. Always carry a quick-source of sugar with you in case you have symptoms of low blood sugar. Examples include hard sugar candy or glucose tablets. Make sure others know that you can choke if you eat or drink when you develop serious symptoms of low blood sugar, such as seizures or unconsciousness. They must get medical help at once. Tell your doctor or health care professional if you have high blood sugar. You might need to change the dose of your medicine. If you are sick or exercising more than usual, you might need to change the dose of your medicine. Do not skip meals. Ask your doctor or health care professional if  you should avoid alcohol. Many nonprescription cough and cold products contain sugar or alcohol. These can affect blood sugar. Make sure that you have the right kind of syringe for the type of insulin you use. Try not to change the brand and type of insulin or syringe unless your health care professional or doctor tells you to. Switching insulin brand or type can cause dangerously high or low blood sugar. Always keep an extra supply of insulin, syringes, and needles on hand. Use a syringe one time only. Throw away syringe and needle in a closed container to prevent accidental needle sticks. Insulin pens and cartridges should never be shared. Even if the needle is changed, sharing may result in passing of viruses like hepatitis or HIV. Each time you get a new box of pen needles, check to see if they are the same type as the ones you were trained to use. If not, ask your health care professional to show you how to use this new type properly. Wear a medical ID bracelet or chain, and carry a card that describes your disease and details of your medicine and dosage times. What side effects may I notice from receiving this medicine? Side effects that you should report to your doctor or health care professional as soon as  possible: -allergic reactions like skin rash, itching or hives, swelling of the face, lips, or tongue -breathing problems -signs and symptoms of high blood sugar such as dizziness, dry mouth, dry skin, fruity breath, nausea, stomach pain, increased hunger or thirst, increased urination -signs and symptoms of low blood sugar such as feeling anxious, confusion, dizziness, increased hunger, unusually weak or tired, sweating, shakiness, cold, irritable, headache, blurred vision, fast heartbeat, loss of consciousness Side effects that usually do not require medical attention (report to your doctor or health care professional if they continue or are bothersome): -increase or decrease in fatty tissue under the skin due to overuse of a particular injection site -itching, burning, swelling, or rash at site where injected This list may not describe all possible side effects. Call your doctor for medical advice about side effects. You may report side effects to FDA at 1-800-FDA-1088. Where should I keep my medicine? Keep out of the reach of children. Store unopened vials in a refrigerator between 2 and 8 degrees C (36 and 46 degrees F). Do not freeze or use if the insulin has been frozen. Opened vials (vials currently in use) may be stored in the refrigerator or at room temperature, at approximately 25 degrees C (77 degrees F) or cooler. Keeping your insulin at room temperature decreases the amount of pain during injection. Once opened, your insulin can be used for 28 days. After 28 days, the vial should be thrown away. Store Lantus [employer] in a refrigerator between 2 and 8 degrees C (36 and 46 degrees F) or at room temperature below 30 degrees C (86 degrees F). Do not freeze or use if the insulin has been frozen. Once opened, the pens should be kept at room temperature. Do not store in the refrigerator once opened. Once opened, the insulin can be used for 28 days. After 28 days, the  Lantus Solostar Pen or Basaglar KwikPen should be thrown away. Store Toujeo and [employer] in a refrigerator between 2 and 8 degrees C (36 and 46 degrees F). Do not freeze or use if the insulin has been frozen. Once opened, the pens should be kept at room temperature below  30 degrees C (86 degrees F). Do not store in the refrigerator once opened. Once opened, the insulin can be used for 56 days. After 56 days, the Toujeo or [employer] Pen should be thrown away. Protect from light and excessive heat. Throw away any unused medicine after the expiration date or after the specified time for room temperature storage has passed. NOTE: This sheet is a summary. It may not cover all possible information. If you have questions about this medicine, talk to your doctor, pharmacist, or health care provider.  2019 Elsevier/Gold Standard (2017-06-27 15:06:27) Insulin Aspart injection What is this medicine? INSULIN ASPART (IN su lin AS part) is a human-made form of insulin. This drug lowers the amount of sugar in your blood. It is a fast acting insulin that starts working faster than regular insulin. It will not work as long as regular insulin. This medicine may be used for other purposes; ask your health care provider or pharmacist if you have questions. COMMON BRAND NAME(S): Fiasp, [employer], NovoLog, NovoLog Flexpen, NovoLog PenFill What should I tell my health care provider before I take this medicine? They need to know if you have any of these conditions: -episodes of low blood sugar -eye disease, vision problems -kidney disease -liver disease -an unusual or allergic reaction to insulin, metacresol, other medicines, foods, dyes, or preservatives -pregnant or trying to get pregnant -breast-feeding How should I use this medicine? This medicine is for injection under the skin. Use exactly as directed. It is important to follow the directions given to you by your health care  professional or doctor. If you are using Novolog, you should start your meal within 5 to 10 minutes after injection. If you are using Fiasp, you should start your meal at the time of injection or within 20 minutes after injection. Have food ready before injection. Do not delay eating. You will be taught how to use this medicine and how to adjust doses for activities and illness. Do not use more insulin than prescribed. Do not use more or less often than prescribed. Always check the appearance of your insulin before using it. This medicine should be clear and colorless like water. Do not use if it is cloudy, thickened, colored, or has solid particles in it. If you use a pen, be sure to take off the outer needle cover before using the dose. It is important that you put your used needles and syringes in a special sharps container. Do not put them in a trash can. If you do not have a sharps container, call your pharmacist or healthcare provider to get one. Talk to your pediatrician regarding the use of this medicine in children. While Novolog may be prescribed for children as young as 71 years of age for selected conditions, precautions do apply. [employer] is not approved for use in children. Overdosage: If you think you have taken too much of this medicine contact a poison control center or emergency room at once. NOTE: This medicine is only for you. Do not share this medicine with others. What if I miss a dose? It is important not to miss a dose. Your health care professional or doctor should discuss a plan for missed doses with you. If you do miss a dose, follow their plan. Do not take double doses. What may interact with this medicine? -other medicines for diabetes Many medications may cause an increase or decrease in blood sugar, these include: -alcohol containing beverages -antiviral medicines for HIV or  AIDS -aspirin and aspirin-like drugs -certain medicines for depression, anxiety, or psychotic  disturbances -chromium -diuretics -female hormones, like estrogens or progestins and birth control pills -heart medicines -isoniazid -MAOIs like Carbex, Eldepryl, Marplan, Nardil, and Parnate -female hormones or anabolic steroids -medicines for weight loss -medicines for allergies, asthma, cold, or cough -niacin -NSAIDs, medicines for pain and inflammation, like ibuprofen or naproxen -octreotide -pentamidine -phenytoin -probenecid -quinolone antibiotics like ciprofloxacin, levofloxacin, ofloxacin -some herbal dietary supplements -steroid medicines like prednisone or cortisone -sulfamethoxazole; trimethoprim -thyroid medicine Some medications can hide the warning symptoms of low blood sugar. You may need to monitor your blood sugar more closely if you are taking one of these medications. These include: -beta-blockers such as atenolol, metoprolol, propranolol -clonidine -guanethidine -reserpine This list may not describe all possible interactions. Give your health care provider a list of all the medicines, herbs, non-prescription drugs, or dietary supplements you use. Also tell them if you smoke, drink alcohol, or use illegal drugs. Some items may interact with your medicine. What should I watch for while using this medicine? Visit your health care professional or doctor for regular checks on your progress. A test called the HbA1C (A1C) will be monitored. This is a simple blood test. It measures your blood sugar control over the last 2 to 3 months. You will receive this test every 3 to 6 months. Learn how to check your blood sugar. Learn the symptoms of low and high blood sugar and how to manage them. Always carry a quick-source of sugar with you in case you have symptoms of low blood sugar. Examples include hard sugar candy or glucose tablets. Make sure others know that you can choke if you eat or drink when you develop serious symptoms of low blood sugar, such as seizures or  unconsciousness. They must get medical help at once. Tell your doctor or health care professional if you have high blood sugar. You might need to change the dose of your medicine. If you are sick or exercising more than usual, you might need to change the dose of your medicine. Do not skip meals. Ask your doctor or health care professional if you should avoid alcohol. Many nonprescription cough and cold products contain sugar or alcohol. These can affect blood sugar. Make sure that you have the right kind of syringe for the type of insulin you use. Try not to change the brand and type of insulin or syringe unless your health care professional or doctor tells you to. Switching insulin brand or type can cause dangerously high or low blood sugar. Always keep an extra supply of insulin, syringes, and needles on hand. Use a syringe one time only. Throw away syringe and needle in a closed container to prevent accidental needle sticks. Insulin pens and cartridges should never be shared. Even if the needle is changed, sharing may result in passing of viruses like hepatitis or HIV. Each time you get a new box of pen needles, check to see if they are the same type as the ones you were trained to use. If not, ask your health care professional to show you how to use this new type properly. Wear a medical ID bracelet or chain, and carry a card that describes your disease and details of your medicine and dosage times. What side effects may I notice from receiving this medicine? Side effects that you should report to your doctor or health care professional as soon as possible: -allergic reactions like skin rash, itching or hives, swelling  of the face, lips, or tongue -breathing problems -signs and symptoms of high blood sugar such as dizziness, dry mouth, dry skin, fruity breath, nausea, stomach pain, increased hunger or thirst, increased urination -signs and symptoms of low blood sugar such as feeling anxious,  confusion, dizziness, increased hunger, unusually weak or tired, sweating, shakiness, cold, irritable, headache, blurred vision, fast heartbeat, loss of consciousness Side effects that usually do not require medical attention (report to your doctor or health care professional if they continue or are bothersome): -increase or decrease in fatty tissue under the skin due to overuse of a particular injection site -itching, burning, swelling, or rash at site where injected This list may not describe all possible side effects. Call your doctor for medical advice about side effects. You may report side effects to FDA at 1-800-FDA-1088. Where should I keep my medicine? Keep out of the reach of children. Store unopened insulin vials in a refrigerator between 2 and 8 degrees C (36 and 46 degrees F). Do not freeze or use if the insulin has been frozen. Opened vials (vials currently in use) may be stored in the refrigerator or at room temperature, at approximately 30 degrees C (86 degrees F) or cooler. Keeping your insulin at room temperature decreases the amount of pain during injection. Once opened, your insulin can be used for 28 days. After 28 days, the vial of insulin should be thrown away. Store unopened cartridges, Novolog FlexPens,or [employer] in a refrigerator between 2 and 8 degrees C (36 and 46 degrees F.) Do not freeze or use if the insulin has been frozen. Once opened, the Novalog FlexPen and cartridges that are inserted into pens should be kept at room temperature, approximately 25 degrees C (77 degrees F) or cooler for up to 28 days; do not store in the refrigerator. The opened [employer] can be stored at room temperature or refrigerated for up to 28 days. After 28 days, any unused insulin in any of these opened products should be thrown away. Protect from light and excessive heat. Throw away any unused medicine after the expiration date or after the specified time for room  temperature storage has passed. NOTE: This sheet is a summary. It may not cover all possible information. If you have questions about this medicine, talk to your doctor, pharmacist, or health care provider.  2019 Elsevier/Gold Standard (2017-06-28 12:05:09) Diabetes Basics  Diabetes (diabetes mellitus) is a long-term (chronic) disease. It occurs when the body does not properly use sugar (glucose) that is released from food after you eat. Diabetes may be caused by one or both of these problems:  Your pancreas does not make enough of a hormone called insulin.  Your body does not react in a normal way to insulin that it makes. Insulin lets sugars (glucose) go into cells in your body. This gives you energy. If you have diabetes, sugars cannot get into cells. This causes high blood sugar (hyperglycemia). Follow these instructions at home: How is diabetes treated? You may need to take insulin or other diabetes medicines daily to keep your blood sugar in balance. Take your diabetes medicines every day as told by your doctor. List your diabetes medicines here: Diabetes medicines  Name of medicine: ______________________________ ? Amount (dose): _______________ Time (a.m./p.m.): _______________ Notes: ___________________________________  Name of medicine: ______________________________ ? Amount (dose): _______________ Time (a.m./p.m.): _______________ Notes: ___________________________________  Name of medicine: ______________________________ ? Amount (dose): _______________ Time (a.m./p.m.): _______________ Notes: ___________________________________ If you use insulin, you will  learn how to give yourself insulin by injection. You may need to adjust the amount based on the food that you eat. List the types of insulin you use here: Insulin  Insulin type: ______________________________ ? Amount (dose): _______________ Time (a.m./p.m.): _______________ Notes:  ___________________________________  Insulin type: ______________________________ ? Amount (dose): _______________ Time (a.m./p.m.): _______________ Notes: ___________________________________  Insulin type: ______________________________ ? Amount (dose): _______________ Time (a.m./p.m.): _______________ Notes: ___________________________________  Insulin type: ______________________________ ? Amount (dose): _______________ Time (a.m./p.m.): _______________ Notes: ___________________________________  Insulin type: ______________________________ ? Amount (dose): _______________ Time (a.m./p.m.): _______________ Notes: ___________________________________ How do I manage my blood sugar?  Check your blood sugar levels using a blood glucose monitor as directed by your doctor. Your doctor will set treatment goals for you. Generally, you should have these blood sugar levels:  Before meals (preprandial): 80-130 mg/dL (4.4-7.2 mmol/L).  After meals (postprandial): below 180 mg/dL (10 mmol/L).  A1c level: less than 7%. Write down the times that you will check your blood sugar levels: Blood sugar checks  Time: _______________ Notes: ___________________________________  Time: _______________ Notes: ___________________________________  Time: _______________ Notes: ___________________________________  Time: _______________ Notes: ___________________________________  Time: _______________ Notes: ___________________________________  Time: _______________ Notes: ___________________________________  What do I need to know about low blood sugar? Low blood sugar is called hypoglycemia. This is when blood sugar is at or below 70 mg/dL (3.9 mmol/L). Symptoms may include:  Feeling: ? Hungry. ? Worried or nervous (anxious). ? Sweaty and clammy. ? Confused. ? Dizzy. ? Sleepy. ? Sick to your stomach (nauseous).  Having: ? A fast heartbeat. ? A headache. ? A change in your  vision. ? Tingling or no feeling (numbness) around the mouth, lips, or tongue. ? Jerky movements that you cannot control (seizure).  Having trouble with: ? Moving (coordination). ? Sleeping. ? Passing out (fainting). ? Getting upset easily (irritability). Treating low blood sugar To treat low blood sugar, eat or drink something sugary right away. If you can think clearly and swallow safely, follow the 15:15 rule:  Take 15 grams of a fast-acting carb (carbohydrate). Talk with your doctor about how much you should take.  Some fast-acting carbs are: ? Sugar tablets (glucose pills). Take 3-4 glucose pills. ? 6-8 pieces of hard candy. ? 4-6 oz (120-150 mL) of fruit juice. ? 4-6 oz (120-150 mL) of regular (not diet) soda. ? 1 Tbsp (15 mL) honey or sugar.  Check your blood sugar 15 minutes after you take the carb.  If your blood sugar is still at or below 70 mg/dL (3.9 mmol/L), take 15 grams of a carb again.  If your blood sugar does not go above 70 mg/dL (3.9 mmol/L) after 3 tries, get help right away.  After your blood sugar goes back to normal, eat a meal or a snack within 1 hour. Treating very low blood sugar If your blood sugar is at or below 54 mg/dL (3 mmol/L), you have very low blood sugar (severe hypoglycemia). This is an emergency. Do not wait to see if the symptoms will go away. Get medical help right away. Call your local emergency services (911 in the [place] Do not drive yourself to the hospital. Questions to ask your health care provider  Do I need to meet with a diabetes educator?  What equipment will I need to care for myself at home?  What diabetes medicines do I need? When should I take them?  How often do I need to check my blood sugar?  What number can I call if  I have questions?  When is my next doctor's visit?  Where can I find a support group for people with diabetes? Where to find more information  American Diabetes Association:  www.diabetes.org  American Association of Diabetes Educators: www.diabeteseducator.org/patient-resources Contact a doctor if:  Your blood sugar is at or above 240 mg/dL (13.3 mmol/L) for 2 days in a row.  You have been sick or have had a fever for 2 days or more, and you are not getting better.  You have any of these problems for more than 6 hours: ? You cannot eat or drink. ? You feel sick to your stomach (nauseous). ? You throw up (vomit). ? You have watery poop (diarrhea). Get help right away if:  Your blood sugar is lower than 54 mg/dL (3 mmol/L).  You get confused.  You have trouble: ? Thinking clearly. ? Breathing. Summary  Diabetes (diabetes mellitus) is a long-term (chronic) disease. It occurs when the body does not properly use sugar (glucose) that is released from food after digestion.  Take insulin and diabetes medicines as told.  Check your blood sugar every day, as often as told.  Keep all follow-up visits as told by your doctor. This is important. This information is not intended to replace advice given to you by your health care provider. Make sure you discuss any questions you have with your health care provider. Document Released: 03/29/2017 Document Revised: 06/17/2017 Document Reviewed: 03/29/2017 Elsevier Interactive Patient Education  2019 [employer].

## 2018-01-15 LAB — VITAMIN D 25 HYDROXY (VIT D DEFICIENCY, FRACTURES): Vit D, 25-Hydroxy: 17.4 ng/mL — ABNORMAL LOW (ref 30.0–100.0)

## 2018-01-15 LAB — COMPREHENSIVE METABOLIC PANEL
ALT: 16 IU/L (ref 0–32)
AST: 17 IU/L (ref 0–40)
Albumin/Globulin Ratio: 1.1 — ABNORMAL LOW (ref 1.2–2.2)
Albumin: 3.1 g/dL — ABNORMAL LOW (ref 3.5–5.5)
Alkaline Phosphatase: 119 IU/L — ABNORMAL HIGH (ref 39–117)
BUN/Creatinine Ratio: 13 (ref 9–23)
BUN: 22 mg/dL (ref 6–24)
Bilirubin Total: 0.2 mg/dL (ref 0.0–1.2)
CO2: 25 mmol/L (ref 20–29)
Calcium: 9.2 mg/dL (ref 8.7–10.2)
Chloride: 98 mmol/L (ref 96–106)
Creatinine, Ser: 1.64 mg/dL — ABNORMAL HIGH (ref 0.57–1.00)
GFR calc Af Amer: 40 mL/min/{1.73_m2} — ABNORMAL LOW (ref >59)
GFR calc non Af Amer: 35 mL/min/{1.73_m2} — ABNORMAL LOW (ref >59)
Globulin, Total: 2.7 g/dL (ref 1.5–4.5)
Glucose: 371 mg/dL — ABNORMAL HIGH (ref 65–99)
Potassium: 4.6 mmol/L (ref 3.5–5.2)
Sodium: 135 mmol/L (ref 134–144)
Total Protein: 5.8 g/dL — ABNORMAL LOW (ref 6.0–8.5)

## 2018-01-15 LAB — CBC WITH DIFFERENTIAL/PLATELET
Basophils Absolute: 0 10*3/uL (ref 0.0–0.2)
Basos: 1 %
EOS (ABSOLUTE): 0.1 10*3/uL (ref 0.0–0.4)
Eos: 2 %
Hematocrit: 33.9 % — ABNORMAL LOW (ref 34.0–46.6)
Hemoglobin: 11.6 g/dL (ref 11.1–15.9)
Immature Grans (Abs): 0 10*3/uL (ref 0.0–0.1)
Immature Granulocytes: 0 %
Lymphocytes Absolute: 2.2 10*3/uL (ref 0.7–3.1)
Lymphs: 38 %
MCH: 27.3 pg (ref 26.6–33.0)
MCHC: 34.2 g/dL (ref 31.5–35.7)
MCV: 80 fL (ref 79–97)
Monocytes Absolute: 0.3 10*3/uL (ref 0.1–0.9)
Monocytes: 5 %
Neutrophils Absolute: 3.2 10*3/uL (ref 1.4–7.0)
Neutrophils: 54 %
Platelets: 239 10*3/uL (ref 150–450)
RBC: 4.25 x10E6/uL (ref 3.77–5.28)
RDW: 12.2 % (ref 11.7–15.4)
WBC: 5.8 10*3/uL (ref 3.4–10.8)

## 2018-01-15 LAB — LIPID PANEL
Chol/HDL Ratio: 3.5 ratio (ref 0.0–4.4)
Cholesterol, Total: 305 mg/dL — ABNORMAL HIGH (ref 100–199)
HDL: 87 mg/dL (ref >39)
LDL Calculated: 197 mg/dL — ABNORMAL HIGH (ref 0–99)
Triglycerides: 104 mg/dL (ref 0–149)
VLDL Cholesterol Cal: 21 mg/dL (ref 5–40)

## 2018-01-15 LAB — VITAMIN B12: Vitamin B-12: 1314 pg/mL — ABNORMAL HIGH (ref 232–1245)

## 2018-01-15 LAB — TSH: TSH: 1.15 u[IU]/mL (ref 0.450–4.500)

## 2018-01-22 ENCOUNTER — Other Ambulatory Visit: Payer: Self-pay | Admitting: Family Medicine

## 2018-01-22 DIAGNOSIS — E785 Hyperlipidemia, unspecified: Secondary | ICD-10-CM

## 2018-01-22 MED ORDER — ATORVASTATIN CALCIUM 10 MG PO TABS: 10.0000 mg | ORAL_TABLET | Freq: Every day | ORAL | 3 refills | Status: DC

## 2018-01-23 ENCOUNTER — Telehealth: Payer: Self-pay

## 2018-01-23 DIAGNOSIS — E785 Hyperlipidemia, unspecified: Secondary | ICD-10-CM

## 2018-01-23 MED ORDER — ATORVASTATIN CALCIUM 10 MG PO TABS: 10.0000 mg | ORAL_TABLET | Freq: Every day | ORAL | 3 refills | Status: DC

## 2018-01-23 MED FILL — ATORVASTATIN 10 MG TABLET: 10 | 30 days supply | Qty: 30 | Fill #0

## 2018-01-23 NOTE — Telephone Encounter (Signed)
-----   Message from [name], [place] sent at 01/22/2018 10:10 PM EST ----- Regarding: "Lab Results" Cholesterol levels are moderately elevated. Rx for Lipitor sent to pharmacy today. She is to continue OTC Calcium/Vitamin D supplement as directed. Keep follow up appointment scheduled for 01/24/2018. Please inform patient.

## 2018-01-23 NOTE — Telephone Encounter (Signed)
-----   Message from [name], [place] sent at 01/22/2018 10:10 PM EST ----- Regarding: "Lab Results" Cholesterol levels are moderately elevated. Rx for Lipitor sent to pharmacy today. She is to continue OTC Calcium/Vitamin D supplement as directed. Keep follow up appointment scheduled for 01/24/2018. Please inform patient.

## 2018-01-23 NOTE — Telephone Encounter (Signed)
Tried to contact patient and telephone is not working

## 2018-01-24 ENCOUNTER — Ambulatory Visit: Payer: Medicaid Other | Admitting: Family Medicine

## 2018-01-24 ENCOUNTER — Other Ambulatory Visit: Payer: Self-pay

## 2018-01-24 MED ORDER — INSULIN GLARGINE 100 UNIT/ML ~~LOC~~ SOLN: 50.0000 [IU] | Freq: Every day | SUBCUTANEOUS | 11 refills | Status: DC

## 2018-01-24 MED ORDER — AMLODIPINE BESYLATE 10 MG PO TABS: 10.0000 mg | ORAL_TABLET | Freq: Every day | ORAL | 3 refills | Status: DC

## 2018-01-24 MED ORDER — LISINOPRIL 10 MG PO TABS: 10.0000 mg | ORAL_TABLET | Freq: Every day | ORAL | 3 refills | Status: DC

## 2018-01-24 MED ORDER — INSULIN ASPART 100 UNIT/ML ~~LOC~~ SOLN: 15.0000 [IU] | Freq: Three times a day (TID) | SUBCUTANEOUS | 11 refills | Status: DC

## 2018-01-24 MED ORDER — METFORMIN HCL 1000 MG PO TABS: 1000.0000 mg | ORAL_TABLET | Freq: Two times a day (BID) | ORAL | 3 refills | Status: DC

## 2018-01-24 MED ORDER — GLIPIZIDE 10 MG PO TABS: 10.0000 mg | ORAL_TABLET | Freq: Two times a day (BID) | ORAL | 3 refills | Status: DC

## 2018-01-24 MED FILL — !NOVOLOG 100UNITS/ML VIAL: 100/ML | 22 days supply | Qty: 10 | Fill #0

## 2018-01-24 MED FILL — metFORMIN HCL 1000 MG TABS: 1000 | 30 days supply | Qty: 60 | Fill #0

## 2018-01-24 MED FILL — glipiZIDE 10 MG TABS: 10 | 30 days supply | Qty: 60 | Fill #0

## 2018-01-24 MED FILL — AMLODIPINE BESYLATE 10 MG T: 10 | 30 days supply | Qty: 30 | Fill #0

## 2018-01-24 MED FILL — !LANTUS 100 UNITS/ML VIAL: 100 | 20 days supply | Qty: 10 | Fill #0

## 2018-01-24 MED FILL — LISINOPRIL 10 MG TABS: 10 | 30 days supply | Qty: 30 | Fill #0

## 2018-01-24 NOTE — Telephone Encounter (Signed)
Patient notified

## 2018-01-24 NOTE — Telephone Encounter (Signed)
-----   Message from [name], [place] sent at 01/22/2018 10:10 PM EST ----- Regarding: "Lab Results" Cholesterol levels are moderately elevated. Rx for Lipitor sent to pharmacy today. She is to continue OTC Calcium/Vitamin D supplement as directed. Keep follow up appointment scheduled for 01/24/2018. Please inform patient.

## 2018-01-31 ENCOUNTER — Encounter: Payer: Self-pay | Admitting: Family Medicine

## 2018-01-31 ENCOUNTER — Ambulatory Visit (INDEPENDENT_AMBULATORY_CARE_PROVIDER_SITE_OTHER): Payer: Self-pay | Admitting: Family Medicine

## 2018-01-31 VITALS — BP 142/74 | HR 88 | Temp 98.0°F | Ht 61.0 in | Wt 140.2 lb

## 2018-01-31 DIAGNOSIS — E119 Type 2 diabetes mellitus without complications: Secondary | ICD-10-CM | POA: Insufficient documentation

## 2018-01-31 DIAGNOSIS — E1169 Type 2 diabetes mellitus with other specified complication: Secondary | ICD-10-CM

## 2018-01-31 DIAGNOSIS — Z09 Encounter for follow-up examination after completed treatment for conditions other than malignant neoplasm: Secondary | ICD-10-CM

## 2018-01-31 DIAGNOSIS — Z794 Long term (current) use of insulin: Secondary | ICD-10-CM

## 2018-01-31 DIAGNOSIS — H259 Unspecified age-related cataract: Secondary | ICD-10-CM | POA: Insufficient documentation

## 2018-01-31 DIAGNOSIS — E1165 Type 2 diabetes mellitus with hyperglycemia: Secondary | ICD-10-CM | POA: Insufficient documentation

## 2018-01-31 DIAGNOSIS — R7309 Other abnormal glucose: Secondary | ICD-10-CM

## 2018-01-31 LAB — POCT URINALYSIS DIP (MANUAL ENTRY)
Bilirubin, UA: NEGATIVE
Glucose, UA: NEGATIVE mg/dL
Leukocytes, UA: NEGATIVE
Nitrite, UA: NEGATIVE
Protein Ur, POC: >=300 mg/dL — AB
Spec Grav, UA: >=1.03 — AB (ref 1.010–1.025)
Urobilinogen, UA: 0.2 E.U./dL
pH, UA: 5.5 (ref 5.0–8.0)

## 2018-01-31 LAB — POCT GLYCOSYLATED HEMOGLOBIN (HGB A1C): Hemoglobin A1C: 12.9 % — AB (ref 4.0–5.6)

## 2018-01-31 LAB — GLUCOSE, POCT (MANUAL RESULT ENTRY): POC Glucose: 126 mg/dl — AB (ref 70–99)

## 2018-01-31 NOTE — Progress Notes (Signed)
Established Patient Office Visit  Subjective:  Patient ID: Jill Jefferson, female    DOB: [birth year]  Age: 58 y.o. MRN: [id number]  CC:  Chief Complaint  Patient presents with  . Follow-up    chronic condition     HPI Jill Jefferson is a 58 year old female who presents for follow up.  Past Medical History:  Diagnosis Date  . Diabetes ([place])   . Hypertension    Current Status: Since her last office visit, she is doing well with no complaints. She denies visual changes, chest pain, cough, shortness of breath, heart palpitations, and falls. She has occasionally headaches and dizziness with position changes. Denies severe headaches, confusion, seizures, double vision, and blurred vision, nausea and vomiting.  She denies fevers, chills, fatigue, recent infections, weight loss, and night sweats. No reports of GI problems such as diarrhea, and constipation. She has no reports of blood in stools, dysuria and hematuria. No depression or anxiety reported. She denies pain today.   Family History  Problem Relation Age of Onset  . Diabetes Mother   . Hypertension Mother     Social History   Socioeconomic History  . Marital status: Single    Spouse name: Not on file  . Number of children: Not on file  . Years of education: Not on file  . Highest education level: Not on file  Occupational History  . Not on file  Social Needs  . Financial resource strain: Not on file  . Food insecurity:    Worry: Not on file    Inability: Not on file  . Transportation needs:    Medical: Not on file    Non-medical: Not on file  Tobacco Use  . Smoking status: Never Smoker  . Smokeless tobacco: Never Used  Substance and Sexual Activity  . Alcohol use: Never    Frequency: Never  . Drug use: Never  . Sexual activity: Not Currently  Lifestyle  . Physical activity:    Days per week: Not on file    Minutes per session: Not on file  . Stress: Not on file  Relationships  . Social connections:     Talks on phone: Not on file    Gets together: Not on file    Attends religious service: Not on file    Active member of club or organization: Not on file    Attends meetings of clubs or organizations: Not on file    Relationship status: Not on file  . Intimate partner violence:    Fear of current or ex partner: Not on file    Emotionally abused: Not on file    Physically abused: Not on file    Forced sexual activity: Not on file  Other Topics Concern  . Not on file  Social History Narrative  . Not on file    Outpatient Medications Prior to Visit  Medication Sig Dispense Refill  . amLODipine (NORVASC) 10 MG tablet Take 1 tablet (10 mg total) by mouth daily. 30 tablet 3  . atorvastatin (LIPITOR) 10 MG tablet Take 1 tablet (10 mg total) by mouth daily. 30 tablet 3  . blood glucose meter kit and supplies Dispense based on patient and insurance preference. Use up to four times daily as directed. (FOR ICD-10 E10.9, E11.9). 1 each 0  . calcium-vitamin D (OSCAL WITH D) 250-125 MG-UNIT tablet Take 1 tablet by mouth daily.    . cholecalciferol (VITAMIN D3) 25 MCG (1000 UT) tablet Take 1,000 Units by mouth  daily.    . glipiZIDE (GLUCOTROL) 10 MG tablet Take 1 tablet (10 mg total) by mouth 2 (two) times daily before a meal. 60 tablet 3  . hydrochlorothiazide (HYDRODIURIL) 25 MG tablet Take 1 tablet (25 mg total) by mouth daily. 30 tablet 3  . insulin aspart (NOVOLOG) 100 UNIT/ML injection Inject 15 Units into the skin 3 (three) times daily before meals. 15 units for blood glucose levels greater than 125. 10 mL 11  . insulin glargine (LANTUS) 100 UNIT/ML injection Inject 0.5 mLs (50 Units total) into the skin at bedtime. 10 mL 11  . lisinopril (PRINIVIL,ZESTRIL) 10 MG tablet Take 1 tablet (10 mg total) by mouth daily. 30 tablet 3  . magnesium 30 MG tablet Take 30 mg by mouth 2 (two) times daily.    . metFORMIN (GLUCOPHAGE) 1000 MG tablet Take 1 tablet (1,000 mg total) by mouth 2 (two) times daily  with a meal. 180 tablet 3  . potassium chloride (K-DUR,KLOR-CON) 10 MEQ tablet Take 10 mEq by mouth once.    . vitamin E 100 UNIT capsule Take 200 Units by mouth daily.     No facility-administered medications prior to visit.     No Known Allergies  ROS Review of Systems  Constitutional: Negative.   HENT: Negative.   Eyes: Positive for visual disturbance (right eye cataract).  Respiratory: Negative.   Cardiovascular: Negative.   Gastrointestinal: Negative.   Endocrine: Negative.   Genitourinary: Negative.   Musculoskeletal: Negative.   Allergic/Immunologic: Negative.   Neurological: Positive for dizziness and headaches.  Hematological: Negative.   Psychiatric/Behavioral: Negative.    Objective:    Physical Exam  Constitutional: She is oriented to person, place, and time. She appears well-developed and well-nourished.  HENT:  Head: Normocephalic and atraumatic.  Eyes: Conjunctivae are normal.  Neck: Normal range of motion. Neck supple.  Cardiovascular: Normal rate, regular rhythm, normal heart sounds and intact distal pulses.  Pulmonary/Chest: Effort normal and breath sounds normal.  Abdominal: Soft. Bowel sounds are normal.  Musculoskeletal: Normal range of motion.  Neurological: She is alert and oriented to person, place, and time.  Skin: Skin is warm and dry.  Psychiatric: She has a normal mood and affect. Her behavior is normal. Judgment and thought content normal.  Nursing note and vitals reviewed.   BP (!) 142/74   Pulse 88   Temp 98 F (36.7 C) (Oral)   Ht _0  (1.549 m)   Wt 140 lb 3.2 oz (63.6 kg)   SpO2 100%   BMI 26.49 kg/m  Wt Readings from Last 3 Encounters:  01/31/18 140 lb 3.2 oz (63.6 kg)  01/14/18 139 lb (63 kg)     Health Maintenance Due  Topic Date Due  . Hepatitis C Screening  Jul 06, 1960  . HIV Screening  02/15/1977  . TETANUS/TDAP  02/15/1981  . PAP SMEAR-Modifier  02/16/1983  . MAMMOGRAM  02/16/2012  . COLONOSCOPY  02/16/2012     There are no preventive care reminders to display for this patient.  Lab Results  Component Value Date   TSH 1.150 01/14/2018   Lab Results  Component Value Date   WBC 5.8 01/14/2018   HGB 11.6 01/14/2018   HCT 33.9 (L) 01/14/2018   MCV 80 01/14/2018   PLT 239 01/14/2018   Lab Results  Component Value Date   NA 135 01/14/2018   K 4.6 01/14/2018   CO2 25 01/14/2018   GLUCOSE 371 (H) 01/14/2018   BUN 22 01/14/2018   CREATININE  1.64 (H) 01/14/2018   BILITOT 0.2 01/14/2018   ALKPHOS 119 (H) 01/14/2018   AST 17 01/14/2018   ALT 16 01/14/2018   PROT 5.8 (L) 01/14/2018   ALBUMIN 3.1 (L) 01/14/2018   CALCIUM 9.2 01/14/2018   Lab Results  Component Value Date   CHOL 305 (H) 01/14/2018   Lab Results  Component Value Date   HDL 87 01/14/2018   Lab Results  Component Value Date   LDLCALC 197 (H) 01/14/2018   Lab Results  Component Value Date   TRIG 104 01/14/2018   Lab Results  Component Value Date   CHOLHDL 3.5 01/14/2018   Lab Results  Component Value Date   HGBA1C 12.9 (A) 01/31/2018      Assessment & Plan:   1. Type 2 diabetes mellitus with other specified complication, with long-term current use of insulin (HCC) Hgb A1c is improved at 12.9 today, from 14.3. she will continue antidiabetic medications as prescribed. She will continue to decrease foods/beverages high in sugars and carbs and follow Heart Healthy or DASH diet. Increase physical activity to at least 30 minutes cardio exercise daily.  - POCT glycosylated hmoglobin (Hb A1C) - POCT urinalysis dipstick - POCT glucose (manual entry)  2. Age-related cataract of both eyes, unspecified age-related cataract type - Ambulatory referral to Ophthalmology  3. Elevated glucose Improved at 126 today, from 494 on 01/14/2018.  Continue to decrease foods/beverages high in sugars and carbs and follow Heart Healthy or DASH diet. Increase physical activity to at least 30 minutes cardio exercise daily.  4.  Follow up She will follow up in 3 months.   No orders of the defined types were placed in this encounter.   Referral Orders     Ambulatory referral to Ophthalmology   Kathe Becton,  MSN, FNP-C [facility] [place],  [id number] [phone]   Problem List Items Addressed This Visit    None    Visit Diagnoses    Type 2 diabetes mellitus with other specified complication, with long-term current use of insulin ([place])    -  Primary   Relevant Orders   POCT glycosylated hemoglobin (Hb A1C) (Completed)   POCT urinalysis dipstick (Completed)   POCT glucose (manual entry) (Completed)   Age-related cataract of both eyes, unspecified age-related cataract type       Relevant Orders   Ambulatory referral to Ophthalmology   Elevated glucose       Follow up          No orders of the defined types were placed in this encounter.   Follow-up: Return in about 3 months (around 05/02/2018).    [name], FNP

## 2018-02-10 MED FILL — !LANTUS 100 UNITS/ML VIAL: 100 | 20 days supply | Qty: 10 | Fill #1

## 2018-02-10 MED FILL — !NOVOLOG 100UNITS/ML VIAL: 100/ML | 22 days supply | Qty: 10 | Fill #1

## 2018-02-24 MED FILL — glipiZIDE 10 MG TABS: 10 | 30 days supply | Qty: 60 | Fill #1

## 2018-02-24 MED FILL — metFORMIN HCL 1000 MG TABS: 1000 | 30 days supply | Qty: 60 | Fill #1

## 2018-02-24 MED FILL — LISINOPRIL 10 MG TABS: 10 | 30 days supply | Qty: 30 | Fill #1

## 2018-02-24 MED FILL — AMLODIPINE BESYLATE 10 MG T: 10 | 30 days supply | Qty: 30 | Fill #1

## 2018-02-24 MED FILL — ATORVASTATIN 10 MG TABLET: 10 | 30 days supply | Qty: 30 | Fill #1

## 2018-03-21 MED FILL — $LANTUS 100 UNITS/ML VIAL: 100 | 80 days supply | Qty: 40 | Fill #2

## 2018-03-21 MED FILL — !TRUE METRIX BLOOD GLUCOSE: 1 days supply | Qty: 1 | Fill #0

## 2018-03-21 MED FILL — AMLODIPINE BESYLATE 10 MG T: 10 | 30 days supply | Qty: 30 | Fill #2

## 2018-03-21 MED FILL — metFORMIN HCL 1000 MG TABS: 1000 | 30 days supply | Qty: 60 | Fill #2

## 2018-03-21 MED FILL — TRUEplus LANCETS 28G MISC: 25 days supply | Qty: 100 | Fill #0

## 2018-03-21 MED FILL — TRUE METRIX TEST STRIP: 25 days supply | Qty: 100 | Fill #0

## 2018-03-21 MED FILL — ATORVASTATIN 10 MG TABLET: 10 | 30 days supply | Qty: 30 | Fill #2

## 2018-03-21 MED FILL — LISINOPRIL 10 MG TABS: 10 | 30 days supply | Qty: 30 | Fill #2

## 2018-04-22 MED FILL — AMLODIPINE BESYLATE 10 MG T: 10 | 30 days supply | Qty: 30 | Fill #3

## 2018-04-22 MED FILL — metFORMIN HCL 1000 MG TABS: 1000 | 30 days supply | Qty: 60 | Fill #3

## 2018-04-22 MED FILL — ATORVASTATIN 10 MG TABLET: 10 | 30 days supply | Qty: 30 | Fill #3

## 2018-04-22 MED FILL — glipiZIDE 10 MG TABS: 10 | 30 days supply | Qty: 60 | Fill #2

## 2018-04-22 MED FILL — LISINOPRIL 10 MG TABS: 10 | 30 days supply | Qty: 30 | Fill #3

## 2018-05-02 ENCOUNTER — Encounter: Payer: Self-pay | Admitting: Family Medicine

## 2018-05-02 ENCOUNTER — Other Ambulatory Visit: Payer: Self-pay

## 2018-05-02 ENCOUNTER — Ambulatory Visit (INDEPENDENT_AMBULATORY_CARE_PROVIDER_SITE_OTHER): Payer: Self-pay | Admitting: Family Medicine

## 2018-05-02 VITALS — BP 152/88 | HR 92 | Temp 98.1°F | Ht 61.0 in | Wt 142.3 lb

## 2018-05-02 DIAGNOSIS — Z794 Long term (current) use of insulin: Secondary | ICD-10-CM

## 2018-05-02 DIAGNOSIS — E1169 Type 2 diabetes mellitus with other specified complication: Secondary | ICD-10-CM

## 2018-05-02 DIAGNOSIS — Z09 Encounter for follow-up examination after completed treatment for conditions other than malignant neoplasm: Secondary | ICD-10-CM

## 2018-05-02 DIAGNOSIS — R600 Localized edema: Secondary | ICD-10-CM

## 2018-05-02 DIAGNOSIS — Z23 Encounter for immunization: Secondary | ICD-10-CM

## 2018-05-02 DIAGNOSIS — Z0000 Encounter for general adult medical examination without abnormal findings: Secondary | ICD-10-CM

## 2018-05-02 LAB — POCT URINALYSIS DIP (MANUAL ENTRY)
Bilirubin, UA: NEGATIVE
Glucose, UA: NEGATIVE mg/dL
Leukocytes, UA: NEGATIVE
Nitrite, UA: NEGATIVE
Protein Ur, POC: >=300 mg/dL — AB
Spec Grav, UA: 1.02 (ref 1.010–1.025)
Urobilinogen, UA: 0.2 E.U./dL
pH, UA: 6.5 (ref 5.0–8.0)

## 2018-05-02 LAB — POCT GLYCOSYLATED HEMOGLOBIN (HGB A1C): Hemoglobin A1C: 7.7 % — AB (ref 4.0–5.6)

## 2018-05-02 LAB — GLUCOSE, POCT (MANUAL RESULT ENTRY): POC Glucose: 127 mg/dl — AB (ref 70–99)

## 2018-05-02 MED ORDER — FUROSEMIDE 20 MG PO TABS: 20.0000 mg | ORAL_TABLET | Freq: Every day | ORAL | 3 refills | Status: DC

## 2018-05-02 MED ORDER — METFORMIN HCL 500 MG PO TABS: 500.0000 mg | ORAL_TABLET | Freq: Every day | ORAL | 3 refills | Status: DC

## 2018-05-02 MED ORDER — INSULIN GLARGINE 100 UNIT/ML ~~LOC~~ SOLN: 20.0000 [IU] | Freq: Every day | SUBCUTANEOUS | 11 refills | Status: DC

## 2018-05-02 MED FILL — FUROSEMIDE 20 MG TABS: 20 | 30 days supply | Qty: 30 | Fill #0

## 2018-05-02 NOTE — Progress Notes (Signed)
[facility]  Established Patient Office Visit  Subjective:  Patient ID: Jill Jefferson, female    DOB: Mar 21, 1960  Age: 58 y.o. MRN: [id number]  CC:  Chief Complaint  Patient presents with  . Follow-up    chronic condition     HPI Jill Jefferson is as 58 year old female who presents for follow up.    Past Medical History:  Diagnosis Date  . Diabetes ([place])   . Edema   . Hypertension    Current Status: Since her last office visit, she is doing well with no complaints. She denies visual changes, chest pain, cough, shortness of breath, heart palpitations, and falls.  She has occasional headaches and dizziness with position changes. Denies severe headaches, confusion, seizures, double vision, and blurred vision, nausea and vomiting. She denies fatigue, frequent urination, blurred vision, excessive hunger, excessive thirst, weight gain, weight loss, and poor wound healing.   She denies fevers, chills, fatigue, recent infections, weight loss, and night sweats. She has not had any falls. No chest pain, heart palpitations, cough and shortness of breath reported. No reports of GI problems such as diarrhea, and constipation. She has no reports of blood in stools, dysuria and hematuria. No depression or anxiety reported. She denies pain today.   History reviewed. No pertinent surgical history.  Family History  Problem Relation Age of Onset  . Diabetes Mother   . Hypertension Mother     Social History   Socioeconomic History  . Marital status: Single    Spouse name: Not on file  . Number of children: Not on file  . Years of education: Not on file  . Highest education level: Not on file  Occupational History  . Not on file  Social Needs  . Financial resource strain: Not on file  . Food insecurity:    Worry: Not on file    Inability: Not on file  . Transportation needs:    Medical: Not on file    Non-medical: Not on file   Tobacco Use  . Smoking status: Never Smoker  . Smokeless tobacco: Never Used  Substance and Sexual Activity  . Alcohol use: Never    Frequency: Never  . Drug use: Never  . Sexual activity: Not Currently  Lifestyle  . Physical activity:    Days per week: Not on file    Minutes per session: Not on file  . Stress: Not on file  Relationships  . Social connections:    Talks on phone: Not on file    Gets together: Not on file    Attends religious service: Not on file    Active member of club or organization: Not on file    Attends meetings of clubs or organizations: Not on file    Relationship status: Not on file  . Intimate partner violence:    Fear of current or ex partner: Not on file    Emotionally abused: Not on file    Physically abused: Not on file    Forced sexual activity: Not on file  Other Topics Concern  . Not on file  Social History Narrative  . Not on file    Outpatient Medications Prior to Visit  Medication Sig Dispense Refill  . amLODipine (NORVASC) 10 MG tablet Take 1 tablet (10 mg total) by mouth daily. 30 tablet 3  . atorvastatin (LIPITOR) 10 MG tablet Take 1 tablet (10 mg total) by mouth daily. 30 tablet 3  .  blood glucose meter kit and supplies Dispense based on patient and insurance preference. Use up to four times daily as directed. (FOR ICD-10 E10.9, E11.9). 1 each 0  . calcium-vitamin D (OSCAL WITH D) 250-125 MG-UNIT tablet Take 1 tablet by mouth daily.    . cholecalciferol (VITAMIN D3) 25 MCG (1000 UT) tablet Take 1,000 Units by mouth daily.    . hydrochlorothiazide (HYDRODIURIL) 25 MG tablet Take 1 tablet (25 mg total) by mouth daily. 30 tablet 3  . lisinopril (PRINIVIL,ZESTRIL) 10 MG tablet Take 1 tablet (10 mg total) by mouth daily. 30 tablet 3  . magnesium 30 MG tablet Take 30 mg by mouth 2 (two) times daily.    . potassium chloride (K-DUR,KLOR-CON) 10 MEQ tablet Take 10 mEq by mouth once.    . vitamin E 100 UNIT capsule Take 200 Units by mouth  daily.    Marland Kitchen glipiZIDE (GLUCOTROL) 10 MG tablet Take 1 tablet (10 mg total) by mouth 2 (two) times daily before a meal. 60 tablet 3  . insulin glargine (LANTUS) 100 UNIT/ML injection Inject 0.5 mLs (50 Units total) into the skin at bedtime. 10 mL 11  . metFORMIN (GLUCOPHAGE) 1000 MG tablet Take 1 tablet (1,000 mg total) by mouth 2 (two) times daily with a meal. 180 tablet 3  . insulin aspart (NOVOLOG) 100 UNIT/ML injection Inject 15 Units into the skin 3 (three) times daily before meals. 15 units for blood glucose levels greater than 125. (Patient not taking: Reported on 05/02/2018) 10 mL 11   No facility-administered medications prior to visit.     No Known Allergies  ROS Review of Systems  Constitutional: Negative.   HENT: Negative.   Eyes: Negative.   Respiratory: Negative.   Cardiovascular: Negative.   Gastrointestinal: Negative.   Endocrine: Negative.   Genitourinary: Negative.   Musculoskeletal: Negative.   Skin: Negative.   Allergic/Immunologic: Negative.   Neurological: Positive for dizziness and headaches.  Hematological: Negative.   Psychiatric/Behavioral: Negative.       Objective:    Physical Exam  Constitutional: She is oriented to person, place, and time. She appears well-developed and well-nourished.  HENT:  Head: Normocephalic and atraumatic.  Eyes: Conjunctivae are normal.  Neck: Normal range of motion. Neck supple.  Cardiovascular: Normal rate, regular rhythm, normal heart sounds and intact distal pulses.  Pulmonary/Chest: Effort normal and breath sounds normal.  Abdominal: Soft.  Musculoskeletal: Normal range of motion.  Neurological: She is alert and oriented to person, place, and time. She has normal reflexes.  Skin: Skin is warm and dry.  Psychiatric: She has a normal mood and affect. Her behavior is normal. Judgment and thought content normal.  Nursing note and vitals reviewed.   BP (!) 152/88   Pulse 92   Temp 98.1 F (36.7 C) (Oral)   Ht 5'  1" (1.549 m)   Wt 142 lb 4.8 oz (64.5 kg)   SpO2 100%   BMI 26.89 kg/m  Wt Readings from Last 3 Encounters:  05/02/18 142 lb 4.8 oz (64.5 kg)  01/31/18 140 lb 3.2 oz (63.6 kg)  01/14/18 139 lb (63 kg)     Health Maintenance Due  Topic Date Due  . Hepatitis C Screening  06-28-1960  . PNEUMOCOCCAL POLYSACCHARIDE VACCINE AGE 56-64 HIGH RISK  02/16/1964  . OPHTHALMOLOGY EXAM  02/16/1972  . HIV Screening  02/15/1977  . PAP SMEAR-Modifier  02/16/1983  . MAMMOGRAM  02/16/2012  . COLONOSCOPY  02/16/2012    There are no preventive care  reminders to display for this patient.  Lab Results  Component Value Date   TSH 1.150 01/14/2018   Lab Results  Component Value Date   WBC 5.8 01/14/2018   HGB 11.6 01/14/2018   HCT 33.9 (L) 01/14/2018   MCV 80 01/14/2018   PLT 239 01/14/2018   Lab Results  Component Value Date   NA 135 01/14/2018   K 4.6 01/14/2018   CO2 25 01/14/2018   GLUCOSE 371 (H) 01/14/2018   BUN 22 01/14/2018   CREATININE 1.64 (H) 01/14/2018   BILITOT 0.2 01/14/2018   ALKPHOS 119 (H) 01/14/2018   AST 17 01/14/2018   ALT 16 01/14/2018   PROT 5.8 (L) 01/14/2018   ALBUMIN 3.1 (L) 01/14/2018   CALCIUM 9.2 01/14/2018   Lab Results  Component Value Date   CHOL 305 (H) 01/14/2018   Lab Results  Component Value Date   HDL 87 01/14/2018   Lab Results  Component Value Date   LDLCALC 197 (H) 01/14/2018   Lab Results  Component Value Date   TRIG 104 01/14/2018   Lab Results  Component Value Date   CHOLHDL 3.5 01/14/2018   Lab Results  Component Value Date   HGBA1C 7.7 (A) 05/02/2018     Assessment & Plan:   1. Type 2 diabetes mellitus with other specified complication, with long-term current use of insulin (HCC) Much improved. Hgb A1c is 7.7 today, from 12.9 on 01/31/2018. We will decrease Lantus to 20 units QHS. We will decrease Metformin to 500 mg daily. We will discontinue Glipizide today.  She will continue to decrease foods/beverages high in  sugars and carbs and follow Heart Healthy or DASH diet. Increase physical activity to at least 30 minutes cardio exercise daily.  - POCT glycosylated hemoglobin (Hb A1C) - POCT urinalysis dipstick - POCT glucose (manual entry) - insulin glargine (LANTUS) 100 UNIT/ML injection; Inject 0.2 mLs (20 Units total) into the skin at bedtime.  Dispense: 10 mL; Refill: 11 - metFORMIN (GLUCOPHAGE) 500 MG tablet; Take 1 tablet (500 mg total) by mouth daily with breakfast.  Dispense: 30 tablet; Refill: 3  2. Need for Tdap vaccination - Tdap vaccine greater than or equal to 7yo IM  3. Bilateral lower extremity edema 2+ bilateral lower extremity edema. Possible futher Cardiology referral. Awaiting on previous medical records and results of past ECHO Cardiogram.  - furosemide (LASIX) 20 MG tablet; Take 1 tablet (20 mg total) by mouth daily.  Dispense: 30 tablet; Refill: 3  4. Healthcare maintenance Requesting Immunization titers needed for job today. - Measles/Mumps/Rubella Immunity - Varicella zoster antibody, IgM  5. Follow up She will follow up in 3 months.  Medical records from previous PCP, looking for Echo to assess for possible referral to Cardiology.  We will do DM foot exam at next office visit.   Meds ordered this encounter  Medications  . insulin glargine (LANTUS) 100 UNIT/ML injection    Sig: Inject 0.2 mLs (20 Units total) into the skin at bedtime.    Dispense:  10 mL    Refill:  11  . metFORMIN (GLUCOPHAGE) 500 MG tablet    Sig: Take 1 tablet (500 mg total) by mouth daily with breakfast.    Dispense:  30 tablet    Refill:  3  . furosemide (LASIX) 20 MG tablet    Sig: Take 1 tablet (20 mg total) by mouth daily.    Dispense:  30 tablet    Refill:  3    Orders Placed  This Encounter  Procedures  . Tdap vaccine greater than or equal to 7yo IM  . Measles/Mumps/Rubella Immunity  . Varicella zoster antibody, IgM  . POCT glycosylated hemoglobin (Hb A1C)  . POCT urinalysis dipstick   . POCT glucose (manual entry)   Referral Orders  No referral(s) requested today    Kathe Becton,  MSN, FNP-C Patient [place], [place] [address] [phone]  Problem List Items Addressed This Visit      Endocrine   Diabetes mellitus ([place]) - Primary   Relevant Medications   insulin glargine (LANTUS) 100 UNIT/ML injection   metFORMIN (GLUCOPHAGE) 500 MG tablet   Other Relevant Orders   POCT glycosylated hemoglobin (Hb A1C) (Completed)   POCT urinalysis dipstick (Completed)   POCT glucose (manual entry) (Completed)    Other Visit Diagnoses    Need for Tdap vaccination       Relevant Orders   Tdap vaccine greater than or equal to 7yo IM (Completed)   Bilateral lower extremity edema       Relevant Medications   furosemide (LASIX) 20 MG tablet   Healthcare maintenance       Relevant Orders   Measles/Mumps/Rubella Immunity (Completed)   Varicella zoster antibody, IgM (Completed)   Follow up          Meds ordered this encounter  Medications  . insulin glargine (LANTUS) 100 UNIT/ML injection    Sig: Inject 0.2 mLs (20 Units total) into the skin at bedtime.    Dispense:  10 mL    Refill:  11  . metFORMIN (GLUCOPHAGE) 500 MG tablet    Sig: Take 1 tablet (500 mg total) by mouth daily with breakfast.    Dispense:  30 tablet    Refill:  3  . furosemide (LASIX) 20 MG tablet    Sig: Take 1 tablet (20 mg total) by mouth daily.    Dispense:  30 tablet    Refill:  3    Follow-up: Return in about 3 months (around 08/01/2018).    [name], FNP

## 2018-05-02 NOTE — Patient Instructions (Signed)
Furosemide tablets °What is this medicine? °FUROSEMIDE (fyoor [place] se mide) is a diuretic. It helps you make more urine and to lose salt and excess water from your body. This medicine is used to treat high blood pressure, and edema or swelling from heart, kidney, or liver disease. °This medicine may be used for other purposes; ask your health care provider or pharmacist if you have questions. °COMMON BRAND NAME(S): Active-Medicated Specimen Kit, Delone, Diuscreen, Lasix, RX Specimen Collection Kit, Specimen Collection Kit, URINX Medicated Specimen Collection °What should I tell my health care provider before I take this medicine? °They need to know if you have any of these conditions: °-abnormal blood electrolytes °-diarrhea or vomiting °-gout °-heart disease °-kidney disease, small amounts of urine, or difficulty passing urine °-liver disease °-thyroid disease °-an unusual or allergic reaction to furosemide, sulfa drugs, other medicines, foods, dyes, or preservatives °-pregnant or trying to get pregnant °-breast-feeding °How should I use this medicine? °Take this medicine by mouth with a glass of water. Follow the directions on the prescription label. You may take this medicine with or without food. If it upsets your stomach, take it with food or milk. Do not take your medicine more often than directed. Remember that you will need to pass more urine after taking this medicine. Do not take your medicine at a time of day that will cause you problems. Do not take at bedtime. °Talk to your pediatrician regarding the use of this medicine in children. While this drug may be prescribed for selected conditions, precautions do apply. °Overdosage: If you think you have taken too much of this medicine contact a poison control center or emergency room at once. °NOTE: This medicine is only for you. Do not share this medicine with others. °What if I miss a dose? °If you miss a dose, take it as soon as you can. If it is almost time  for your next dose, take only that dose. Do not take double or extra doses. °What may interact with this medicine? °-aspirin and aspirin-like medicines °-certain antibiotics °-chloral hydrate °-cisplatin °-cyclosporine °-digoxin °-diuretics °-laxatives °-lithium °-medicines for blood pressure °-medicines that relax muscles for surgery °-methotrexate °-NSAIDs, medicines for pain and inflammation like ibuprofen, naproxen, or indomethacin °-phenytoin °-steroid medicines like prednisone or cortisone °-sucralfate °-thyroid hormones °This list may not describe all possible interactions. Give your health care provider a list of all the medicines, herbs, non-prescription drugs, or dietary supplements you use. Also tell them if you smoke, drink alcohol, or use illegal drugs. Some items may interact with your medicine. °What should I watch for while using this medicine? °Visit your doctor or health care professional for regular checks on your progress. Check your blood pressure regularly. Ask your doctor or health care professional what your blood pressure should be, and when you should contact him or her. If you are a diabetic, check your blood sugar as directed. °You may need to be on a special diet while taking this medicine. Check with your doctor. Also, ask how many glasses of fluid you need to drink a day. You must not get dehydrated. °You may get drowsy or dizzy. Do not drive, use machinery, or do anything that needs mental alertness until you know how this drug affects you. Do not stand or sit up quickly, especially if you are an older patient. This reduces the risk of dizzy or fainting spells. Alcohol can make you more drowsy and dizzy. Avoid alcoholic drinks. °This medicine can make you more sensitive   to the sun. Keep out of the sun. If you cannot avoid being in the sun, wear protective clothing and use sunscreen. Do not use sun lamps or tanning beds/booths. What side effects may I notice from receiving this  medicine? Side effects that you should report to your doctor or health care professional as soon as possible: -blood in urine or stools -dry mouth -fever or chills -hearing loss or ringing in the ears -irregular heartbeat -muscle pain or weakness, cramps -skin rash -stomach upset, pain, or nausea -tingling or numbness in the hands or feet -unusually weak or tired -vomiting or diarrhea -yellowing of the eyes or skin Side effects that usually do not require medical attention (report to your doctor or health care professional if they continue or are bothersome): -headache -loss of appetite -unusual bleeding or bruising This list may not describe all possible side effects. Call your doctor for medical advice about side effects. You may report side effects to FDA at 1-800-FDA-1088. Where should I keep my medicine? Keep out of the reach of children. Store at room temperature between 15 and 30 degrees C (59 and 86 degrees F). Protect from light. Throw away any unused medicine after the expiration date. NOTE: This sheet is a summary. It may not cover all possible information. If you have questions about this medicine, talk to your doctor, pharmacist, or health care provider.  2019 Elsevier/Gold Standard (2014-03-17 13:49:50) Peripheral Edema  Peripheral edema is swelling that is caused by a buildup of fluid. Peripheral edema most often affects the lower legs, ankles, and feet. It can also develop in the arms, hands, and face. The area of the body that has peripheral edema will look swollen. It may also feel heavy or warm. Your clothes may start to feel tight. Pressing on the area may make a temporary dent in your skin. You may not be able to move your arm or leg as much as usual. There are many causes of peripheral edema. It can be a complication of other diseases, such as congestive heart failure, kidney disease, or a problem with your blood circulation. It also can be a side effect of certain  medicines. It often happens to women during pregnancy. Sometimes, the cause is not known. Treating the underlying condition is often the only treatment for peripheral edema. Follow these instructions at home: Pay attention to any changes in your symptoms. Take these actions to help with your discomfort:  Raise (elevate) your legs while you are sitting or lying down.  Move around often to prevent stiffness and to lessen swelling. Do not sit or stand for long periods of time.  Wear support stockings as told by your health care provider.  Follow instructions from your health care provider about limiting salt (sodium) in your diet. Sometimes eating less salt can reduce swelling.  Take over-the-counter and prescription medicines only as told by your health care provider. Your health care provider may prescribe medicine to help your body get rid of excess water (diuretic).  Keep all follow-up visits as told by your health care provider. This is important. Contact a health care provider if:  You have a fever.  Your edema starts suddenly or is getting worse, especially if you are pregnant or have a medical condition.  You have swelling in only one leg.  You have increased swelling and pain in your legs. Get help right away if:  You develop shortness of breath, especially when you are lying down.  You have pain in  your chest or abdomen.  You feel weak.  You faint. This information is not intended to replace advice given to you by your health care provider. Make sure you discuss any questions you have with your health care provider. Document Released: 02/02/2004 Document Revised: 05/30/2015 Document Reviewed: 07/07/2014 Elsevier Interactive Patient Education  2019 [employer].

## 2018-05-03 DIAGNOSIS — R600 Localized edema: Secondary | ICD-10-CM | POA: Insufficient documentation

## 2018-05-06 LAB — VARICELLA ZOSTER ANTIBODY, IGM: Varicella IgM: 1.77 index — ABNORMAL HIGH (ref 0.00–0.90)

## 2018-05-06 LAB — MEASLES/MUMPS/RUBELLA IMMUNITY
MUMPS ABS, IGG: 121 AU/mL (ref >10.9)
RUBEOLA AB, IGG: >300 AU/mL (ref >16.4)
Rubella Antibodies, IGG: 25.6 index (ref >0.99)

## 2018-05-12 ENCOUNTER — Telehealth: Payer: Self-pay

## 2018-05-12 NOTE — Telephone Encounter (Signed)
Tried to contact patient several times no answer. Vm left again

## 2018-05-12 NOTE — Telephone Encounter (Signed)
-----   Message from [name], [place] sent at 05/03/2018  8:11 PM EDT ----- Regarding: "Previous Medical Records" Please assess if patient signed release of medical records from previous PCP in [place]. She was last seen on 05/02/2018. Thank you.

## 2018-05-15 NOTE — Telephone Encounter (Signed)
Called patient several times and no answer. Vm left as well

## 2018-05-15 NOTE — Telephone Encounter (Signed)
-----   Message from [name], [place] sent at 05/03/2018  8:11 PM EDT ----- Regarding: "Previous Medical Records" Please assess if patient signed release of medical records from previous PCP in [place]. She was last seen on 05/02/2018. Thank you.

## 2018-05-22 ENCOUNTER — Other Ambulatory Visit: Payer: Self-pay | Admitting: Family Medicine

## 2018-05-22 DIAGNOSIS — E785 Hyperlipidemia, unspecified: Secondary | ICD-10-CM

## 2018-05-23 ENCOUNTER — Telehealth: Payer: Self-pay | Admitting: Family Medicine

## 2018-05-23 ENCOUNTER — Telehealth: Payer: Self-pay

## 2018-05-23 DIAGNOSIS — E785 Hyperlipidemia, unspecified: Secondary | ICD-10-CM

## 2018-05-23 MED ORDER — LISINOPRIL 10 MG PO TABS: 10.0000 mg | ORAL_TABLET | Freq: Every day | ORAL | 3 refills | Status: DC

## 2018-05-23 MED ORDER — HYDROCHLOROTHIAZIDE 25 MG PO TABS: 25.0000 mg | ORAL_TABLET | Freq: Every day | ORAL | 3 refills | Status: DC

## 2018-05-23 MED ORDER — ATORVASTATIN CALCIUM 10 MG PO TABS: 10.0000 mg | ORAL_TABLET | Freq: Every day | ORAL | 3 refills | Status: DC

## 2018-05-23 MED FILL — ATORVASTATIN 10 MG TABLET: 10 | 30 days supply | Qty: 30 | Fill #0

## 2018-05-23 MED FILL — LISINOPRIL 10 MG TABS: 10 | 30 days supply | Qty: 30 | Fill #0

## 2018-05-23 MED FILL — HYDROCHLOROTHIAZIDE 25 MG T: 25 | 30 days supply | Qty: 30 | Fill #0

## 2018-05-23 NOTE — Telephone Encounter (Signed)
Medication sent to [facility]. Tried to contact patient no answer

## 2018-05-28 MED FILL — FUROSEMIDE 20 MG TABS: 20 | 30 days supply | Qty: 30 | Fill #1

## 2018-05-28 NOTE — Telephone Encounter (Signed)
Sent to provider 

## 2018-06-10 MED FILL — metFORMIN HCL 1000 MG TABS: 1000 | 30 days supply | Qty: 60 | Fill #4

## 2018-06-16 MED FILL — ?HYDROCHLOROTHIAZIDE 25MG T: 25 | 30 days supply | Qty: 30 | Fill #1

## 2018-06-16 MED FILL — LISINOPRIL 10 MG TABS: 10 | 30 days supply | Qty: 30 | Fill #1

## 2018-06-16 MED FILL — ?FUROSEMIDE 20 MG TABLET: 20 | 30 days supply | Qty: 30 | Fill #2

## 2018-06-16 MED FILL — ?ATORVASTATIN 10 MG TABLET: 10 | 30 days supply | Qty: 30 | Fill #1

## 2018-07-22 MED FILL — ?HYDROCHLOROTHIAZIDE 25MG T: 25 | 30 days supply | Qty: 30 | Fill #2

## 2018-07-22 MED FILL — ?FUROSEMIDE 20 MG TABLET: 20 | 30 days supply | Qty: 30 | Fill #3

## 2018-07-22 MED FILL — LISINOPRIL 10 MG TABS: 10 | 30 days supply | Qty: 30 | Fill #2

## 2018-07-22 MED FILL — ?ATORVASTATIN 10 MG TABLET: 10 | 30 days supply | Qty: 30 | Fill #2

## 2018-07-22 MED FILL — metFORMIN HCL 1000 MG TABS: 1000 | 30 days supply | Qty: 60 | Fill #5

## 2018-08-01 ENCOUNTER — Other Ambulatory Visit: Payer: Self-pay

## 2018-08-01 ENCOUNTER — Encounter: Payer: Self-pay | Admitting: Family Medicine

## 2018-08-01 ENCOUNTER — Ambulatory Visit (INDEPENDENT_AMBULATORY_CARE_PROVIDER_SITE_OTHER): Payer: Self-pay | Admitting: Family Medicine

## 2018-08-01 VITALS — BP 138/86 | HR 74 | Ht 61.0 in | Wt 137.0 lb

## 2018-08-01 DIAGNOSIS — E119 Type 2 diabetes mellitus without complications: Secondary | ICD-10-CM

## 2018-08-01 DIAGNOSIS — M25541 Pain in joints of right hand: Secondary | ICD-10-CM

## 2018-08-01 DIAGNOSIS — R600 Localized edema: Secondary | ICD-10-CM

## 2018-08-01 DIAGNOSIS — M25542 Pain in joints of left hand: Secondary | ICD-10-CM

## 2018-08-01 DIAGNOSIS — Z09 Encounter for follow-up examination after completed treatment for conditions other than malignant neoplasm: Secondary | ICD-10-CM

## 2018-08-01 DIAGNOSIS — Z794 Long term (current) use of insulin: Secondary | ICD-10-CM

## 2018-08-01 DIAGNOSIS — R7309 Other abnormal glucose: Secondary | ICD-10-CM

## 2018-08-01 DIAGNOSIS — E1169 Type 2 diabetes mellitus with other specified complication: Secondary | ICD-10-CM

## 2018-08-01 LAB — POCT URINALYSIS DIP (MANUAL ENTRY)
Bilirubin, UA: NEGATIVE
Glucose, UA: 500 mg/dL — AB
Ketones, POC UA: NEGATIVE mg/dL
Leukocytes, UA: NEGATIVE
Nitrite, UA: NEGATIVE
Protein Ur, POC: 100 mg/dL — AB
Spec Grav, UA: 1.02 (ref 1.010–1.025)
Urobilinogen, UA: 0.2 E.U./dL
pH, UA: 6 (ref 5.0–8.0)

## 2018-08-01 LAB — POCT GLYCOSYLATED HEMOGLOBIN (HGB A1C): Hemoglobin A1C: 12.5 % — AB (ref 4.0–5.6)

## 2018-08-01 LAB — GLUCOSE, POCT (MANUAL RESULT ENTRY): POC Glucose: 271 mg/dl — AB (ref 70–99)

## 2018-08-01 MED ORDER — METFORMIN HCL 500 MG PO TABS: 500.0000 mg | ORAL_TABLET | Freq: Every day | ORAL | 3 refills | Status: DC

## 2018-08-01 MED ORDER — METFORMIN HCL 1000 MG PO TABS: 1000.0000 mg | ORAL_TABLET | Freq: Two times a day (BID) | ORAL | 3 refills | Status: DC

## 2018-08-01 MED ORDER — GLIPIZIDE 10 MG PO TABS: 10.0000 mg | ORAL_TABLET | Freq: Two times a day (BID) | ORAL | 3 refills | Status: DC

## 2018-08-01 MED ORDER — ACETAMINOPHEN 500 MG PO TABS: 500.0000 mg | ORAL_TABLET | Freq: Two times a day (BID) | ORAL | 3 refills | Status: DC

## 2018-08-01 MED FILL — ?GLIPIZIDE 10 MG TABLET: 10 | 30 days supply | Qty: 60 | Fill #0

## 2018-08-01 NOTE — Progress Notes (Signed)
Patient [facility]  Established Patient Office Visit  Subjective:  Patient ID: Jill Jefferson, female    DOB: 25-Nov-1960  Age: 58 y.o. MRN: [id number]  CC:  Chief Complaint  Patient presents with  . Follow-up    Chronic condition   . Arthritis    hands    HPI Jill Jefferson is a 58 year old female who presents for follow up today.   Past Medical History:  Diagnosis Date  . Diabetes ([place])   . Edema   . Hypertension    Current Status: Since her last office visit, she is doing well with no complaints. She states that she is currently drinking all fruity smoothies daily, which she also adds honey. She does not use veggies in her smoothies. She has not been monitoring her pre and post prandial blood glucose levels lately. She denies fatigue, frequent urination, blurred vision, excessive hunger, excessive thirst, weight gain, weight loss, and poor wound healing. She continues to check her feet regularly. She denies visual changes, chest pain, cough, shortness of breath, heart palpitations, and falls. She has occasional headaches and dizziness with position changes. Denies severe headaches, confusion, seizures, double vision, and blurred vision, nausea and vomiting.  She denies fevers, chills, recent infections, weight loss, and night sweats. She has not had any falls. No chest pain, heart palpitations, cough and shortness of breath reported. No reports of GI problems such as diarrhea, and constipation. She has no reports of blood in stools, dysuria and hematuria. No depression or anxiety reported. She denies pain today.   No past surgical history on file.  Family History  Problem Relation Age of Onset  . Diabetes Mother   . Hypertension Mother     Social History   Socioeconomic History  . Marital status: Single    Spouse name: Not on file  . Number of children: Not on file  . Years of education: Not on file  . Highest education level:  Not on file  Occupational History  . Not on file  Social Needs  . Financial resource strain: Not on file  . Food insecurity    Worry: Not on file    Inability: Not on file  . Transportation needs    Medical: Not on file    Non-medical: Not on file  Tobacco Use  . Smoking status: Never Smoker  . Smokeless tobacco: Never Used  Substance and Sexual Activity  . Alcohol use: Never    Frequency: Never  . Drug use: Never  . Sexual activity: Not Currently  Lifestyle  . Physical activity    Days per week: Not on file    Minutes per session: Not on file  . Stress: Not on file  Relationships  . Social Herbalist on phone: Not on file    Gets together: Not on file    Attends religious service: Not on file    Active member of club or organization: Not on file    Attends meetings of clubs or organizations: Not on file    Relationship status: Not on file  . Intimate partner violence    Fear of current or ex partner: Not on file    Emotionally abused: Not on file    Physically abused: Not on file    Forced sexual activity: Not on file  Other Topics Concern  . Not on file  Social History Narrative  . Not on file    Outpatient  Medications Prior to Visit  Medication Sig Dispense Refill  . amLODipine (NORVASC) 10 MG tablet Take 1 tablet (10 mg total) by mouth daily. 30 tablet 3  . atorvastatin (LIPITOR) 10 MG tablet Take 1 tablet (10 mg total) by mouth daily. 30 tablet 3  . blood glucose meter kit and supplies Dispense based on patient and insurance preference. Use up to four times daily as directed. (FOR ICD-10 E10.9, E11.9). 1 each 0  . calcium-vitamin D (OSCAL WITH D) 250-125 MG-UNIT tablet Take 1 tablet by mouth daily.    . cholecalciferol (VITAMIN D3) 25 MCG (1000 UT) tablet Take 1,000 Units by mouth daily.    . furosemide (LASIX) 20 MG tablet Take 1 tablet (20 mg total) by mouth daily. 30 tablet 3  . hydrochlorothiazide (HYDRODIURIL) 25 MG tablet Take 1 tablet (25 mg  total) by mouth daily. 30 tablet 3  . insulin glargine (LANTUS) 100 UNIT/ML injection Inject 0.2 mLs (20 Units total) into the skin at bedtime. 10 mL 11  . lisinopril (ZESTRIL) 10 MG tablet Take 1 tablet (10 mg total) by mouth daily. 30 tablet 3  . magnesium 30 MG tablet Take 30 mg by mouth 2 (two) times daily.    . potassium chloride (K-DUR,KLOR-CON) 10 MEQ tablet Take 10 mEq by mouth once.    . vitamin E 100 UNIT capsule Take 200 Units by mouth daily.    . metFORMIN (GLUCOPHAGE) 500 MG tablet Take 1 tablet (500 mg total) by mouth daily with breakfast. 30 tablet 3   No facility-administered medications prior to visit.     No Known Allergies  ROS Review of Systems  Constitutional: Negative.   HENT: Negative.   Eyes: Negative.   Respiratory: Negative.   Cardiovascular: Negative.   Gastrointestinal: Negative.   Endocrine: Negative.   Genitourinary: Negative.   Musculoskeletal: Negative.   Skin: Negative.   Allergic/Immunologic: Negative.   Neurological: Positive for dizziness (occasional ) and headaches (occasional).  Hematological: Negative.   Psychiatric/Behavioral: Negative.       Objective:    Physical Exam  Constitutional: She is oriented to person, place, and time. She appears well-developed and well-nourished.  HENT:  Head: Normocephalic and atraumatic.  Eyes: Conjunctivae are normal.  Neck: Normal range of motion. Neck supple.  Cardiovascular: Normal rate, regular rhythm, normal heart sounds and intact distal pulses.  Pulmonary/Chest: Effort normal and breath sounds normal.  Abdominal: Soft. Bowel sounds are normal.  Musculoskeletal: Normal range of motion.        General: Edema (bilateral lower extremity edema) present.  Neurological: She is alert and oriented to person, place, and time. She has normal reflexes.  Skin: Skin is warm and dry.  Psychiatric: She has a normal mood and affect. Her behavior is normal. Judgment and thought content normal.  Nursing note  and vitals reviewed.   BP 138/86   Pulse 74   Ht _0  (1.549 m)   Wt 137 lb (62.1 kg)   SpO2 100%   BMI 25.89 kg/m  Wt Readings from Last 3 Encounters:  08/01/18 137 lb (62.1 kg)  05/02/18 142 lb 4.8 oz (64.5 kg)  01/31/18 140 lb 3.2 oz (63.6 kg)    Health Maintenance Due  Topic Date Due  . Hepatitis C Screening  [birth year]  . OPHTHALMOLOGY EXAM  02/16/1972  . HIV Screening  02/15/1977  . MAMMOGRAM  02/16/2012  . COLONOSCOPY  02/16/2012    There are no preventive care reminders to display for this patient.  Lab Results  Component Value Date   TSH 1.150 01/14/2018   Lab Results  Component Value Date   WBC 5.8 01/14/2018   HGB 11.6 01/14/2018   HCT 33.9 (L) 01/14/2018   MCV 80 01/14/2018   PLT 239 01/14/2018   Lab Results  Component Value Date   NA 135 01/14/2018   K 4.6 01/14/2018   CO2 25 01/14/2018   GLUCOSE 371 (H) 01/14/2018   BUN 22 01/14/2018   CREATININE 1.64 (H) 01/14/2018   BILITOT 0.2 01/14/2018   ALKPHOS 119 (H) 01/14/2018   AST 17 01/14/2018   ALT 16 01/14/2018   PROT 5.8 (L) 01/14/2018   ALBUMIN 3.1 (L) 01/14/2018   CALCIUM 9.2 01/14/2018   Lab Results  Component Value Date   CHOL 305 (H) 01/14/2018   Lab Results  Component Value Date   HDL 87 01/14/2018   Lab Results  Component Value Date   LDLCALC 197 (H) 01/14/2018   Lab Results  Component Value Date   TRIG 104 01/14/2018   Lab Results  Component Value Date   CHOLHDL 3.5 01/14/2018   Lab Results  Component Value Date   HGBA1C 12.5 (A) 08/01/2018   Assessment & Plan:   1. Type 2 diabetes mellitus with other specified complication, with long-term current use of insulin (HCC) She will continue to decrease foods/beverages high in sugars and carbs and follow Heart Healthy or DASH diet. Increase physical activity to at least 30 minutes cardio exercise daily.  - POCT glycosylated hemoglobin (Hb A1C) - POCT urinalysis dipstick - POCT glucose (manual entry) - metFORMIN  (GLUCOPHAGE) 1000 MG tablet; Take 1 tablet (1,000 mg total) by mouth 2 (two) times daily with a meal.  Dispense: 180 tablet; Refill: 3 - glipiZIDE (GLUCOTROL) 10 MG tablet; Take 1 tablet (10 mg total) by mouth 2 (two) times daily before a meal.  Dispense: 60 tablet; Refill: 3 - HM Diabetes Foot Exam  2. Joint pain in fingers of both hands She will continue OTC pain medications as needed. we will possibly screen her for RA if joint pain in hands do not improve or worsen.  - acetaminophen (TYLENOL) 500 MG tablet; Take 1 tablet (500 mg total) by mouth 2 (two) times a day.  Dispense: 60 tablet; Refill: 3  3. Hemoglobin A1C greater than 9%, indicating poor diabetic control Worsened. Hgb A1c increased at 12.5 today, from 7.4 on 05/02/2018. She will continue to decrease foods/beverages high in sugars and carbs and follow Heart Healthy or DASH diet. Increase physical activity to at least 30 minutes cardio exercise daily.  - HM Diabetes Foot Exam  4. Bilateral lower extremity edema 2+ edema is stable. Patient is encouraged to monitor her daily weight and report if she has a 5 lb weight or experiences shortness of breath, he is to report to office.   5. Encounter for diabetic foot exam ([place]) Negative. Foot exam tolerated well. No decreased sensitivity noted upon foot exam. Patient counseled on proper foot hygiene. She is encouraged to exam feet often (daily), using mirror if necessary; keep feet clean and dry (especially between toes), keep feet moistened, wear cotton socks, and avoid wearing open-toed shoes, high-heel shoes, and sandals. Patient verbalized understanding. .diab  6. Follow up She will follow up in 3 months.   Meds ordered this encounter  Medications  . acetaminophen (TYLENOL) 500 MG tablet    Sig: Take 1 tablet (500 mg total) by mouth 2 (two) times a day.    Dispense:  60 tablet    Refill:  3  . DISCONTD: metFORMIN (GLUCOPHAGE) 500 MG tablet    Sig: Take 1 tablet (500 mg total) by  mouth daily with breakfast.    Dispense:  30 tablet    Refill:  3  . metFORMIN (GLUCOPHAGE) 1000 MG tablet    Sig: Take 1 tablet (1,000 mg total) by mouth 2 (two) times daily with a meal.    Dispense:  180 tablet    Refill:  3  . glipiZIDE (GLUCOTROL) 10 MG tablet    Sig: Take 1 tablet (10 mg total) by mouth 2 (two) times daily before a meal.    Dispense:  60 tablet    Refill:  3    Orders Placed This Encounter  Procedures  . POCT glycosylated hemoglobin (Hb A1C)  . POCT urinalysis dipstick  . POCT glucose (manual entry)  . HM Diabetes Foot Exam    Referral Orders  No referral(s) requested today    Kathe Becton,  MSN, FNP-BC [place], [place] [address] [phone]- fax   Problem List Items Addressed This Visit      Endocrine   Diabetes mellitus ([place]) - Primary   Relevant Medications   metFORMIN (GLUCOPHAGE) 1000 MG tablet   glipiZIDE (GLUCOTROL) 10 MG tablet   Other Relevant Orders   POCT glycosylated hemoglobin (Hb A1C) (Completed)   POCT urinalysis dipstick (Completed)   POCT glucose (manual entry) (Completed)   HM Diabetes Foot Exam (Completed)     Other   Bilateral lower extremity edema    Other Visit Diagnoses    Joint pain in fingers of both hands       Relevant Medications   acetaminophen (TYLENOL) 500 MG tablet   Hemoglobin A1C greater than 9%, indicating poor diabetic control       Relevant Medications   metFORMIN (GLUCOPHAGE) 1000 MG tablet   glipiZIDE (GLUCOTROL) 10 MG tablet   Other Relevant Orders   HM Diabetes Foot Exam (Completed)   Encounter for diabetic foot exam ([place])       Relevant Medications   metFORMIN (GLUCOPHAGE) 1000 MG tablet   glipiZIDE (GLUCOTROL) 10 MG tablet   Follow up          Meds ordered this encounter  Medications  . acetaminophen (TYLENOL) 500 MG tablet    Sig: Take 1 tablet (500 mg total) by mouth 2  (two) times a day.    Dispense:  60 tablet    Refill:  3  . DISCONTD: metFORMIN (GLUCOPHAGE) 500 MG tablet    Sig: Take 1 tablet (500 mg total) by mouth daily with breakfast.    Dispense:  30 tablet    Refill:  3  . metFORMIN (GLUCOPHAGE) 1000 MG tablet    Sig: Take 1 tablet (1,000 mg total) by mouth 2 (two) times daily with a meal.    Dispense:  180 tablet    Refill:  3  . glipiZIDE (GLUCOTROL) 10 MG tablet    Sig: Take 1 tablet (10 mg total) by mouth 2 (two) times daily before a meal.    Dispense:  60 tablet    Refill:  3    Follow-up: Return in about 3 months (around 11/01/2018).    [name], FNP

## 2018-08-03 DIAGNOSIS — R7309 Other abnormal glucose: Secondary | ICD-10-CM | POA: Insufficient documentation

## 2018-08-03 DIAGNOSIS — M25541 Pain in joints of right hand: Secondary | ICD-10-CM | POA: Insufficient documentation

## 2018-08-03 DIAGNOSIS — M25542 Pain in joints of left hand: Secondary | ICD-10-CM | POA: Insufficient documentation

## 2018-08-18 MED FILL — ?HYDROCHLOROTHIAZIDE 25MG T: 25 | 30 days supply | Qty: 30 | Fill #3

## 2018-08-18 MED FILL — ?ATORVASTATIN 10 MG TABLET: 10 | 30 days supply | Qty: 30 | Fill #3

## 2018-08-18 MED FILL — LISINOPRIL 10 MG TABS: 10 | 30 days supply | Qty: 30 | Fill #3

## 2018-08-27 ENCOUNTER — Telehealth: Payer: Self-pay

## 2018-08-29 ENCOUNTER — Other Ambulatory Visit: Payer: Self-pay | Admitting: Family Medicine

## 2018-08-29 DIAGNOSIS — R600 Localized edema: Secondary | ICD-10-CM

## 2018-08-29 MED ORDER — FUROSEMIDE 20 MG PO TABS: 20.0000 mg | ORAL_TABLET | Freq: Every day | ORAL | 3 refills | Status: DC

## 2018-08-29 NOTE — Telephone Encounter (Signed)
Called and l/m about her rx  That was sent to the pharmacy

## 2018-09-01 ENCOUNTER — Other Ambulatory Visit: Payer: Self-pay | Admitting: Family Medicine

## 2018-09-01 DIAGNOSIS — R600 Localized edema: Secondary | ICD-10-CM

## 2018-09-01 MED FILL — ?FUROSEMIDE 20MG TABLET: 20 | 30 days supply | Qty: 30 | Fill #0

## 2018-09-18 ENCOUNTER — Other Ambulatory Visit: Payer: Self-pay | Admitting: Family Medicine

## 2018-09-18 DIAGNOSIS — E785 Hyperlipidemia, unspecified: Secondary | ICD-10-CM

## 2018-09-18 MED FILL — metFORMIN HCL 1000 MG TABS: 1000 | 30 days supply | Qty: 60 | Fill #6

## 2018-09-18 MED FILL — ?ATORVASTATIN 10 MG TABLET: 10 | 30 days supply | Qty: 30 | Fill #0

## 2018-09-18 MED FILL — LISINOPRIL 10 MG TABS: 10 | 30 days supply | Qty: 30 | Fill #0

## 2018-09-18 MED FILL — ?HYDROCHLOROTHIAZIDE 25MG T: 25 | 30 days supply | Qty: 30 | Fill #0

## 2018-10-17 MED FILL — metFORMIN HCL 1000 MG TABS: 1000 | 30 days supply | Qty: 60 | Fill #7

## 2018-10-17 MED FILL — LISINOPRIL 10 MG TABS: 10 | 30 days supply | Qty: 30 | Fill #1

## 2018-10-17 MED FILL — ?ATORVASTATIN 10 MG TABLET: 10 | 30 days supply | Qty: 30 | Fill #1

## 2018-10-17 MED FILL — ?HYDROCHLOROTHIAZIDE 25MG T: 25 | 30 days supply | Qty: 30 | Fill #1

## 2018-10-17 MED FILL — ?FUROSEMIDE 20MG TABLET: 20 | 30 days supply | Qty: 30 | Fill #1

## 2018-10-21 MED FILL — ?GLIPIZIDE 10 MG TABLET: 10 | 30 days supply | Qty: 60 | Fill #1

## 2018-10-29 ENCOUNTER — Telehealth: Payer: Self-pay | Admitting: Family Medicine

## 2018-10-30 NOTE — Telephone Encounter (Signed)
Pt was called several times no answer left message to call office to reschedule

## 2018-10-31 ENCOUNTER — Ambulatory Visit: Payer: Medicaid Other | Admitting: Family Medicine

## 2018-11-09 DIAGNOSIS — R809 Proteinuria, unspecified: Secondary | ICD-10-CM

## 2018-11-09 HISTORY — DX: Proteinuria, unspecified: R80.9

## 2018-11-17 MED FILL — ?GLIPIZIDE 10 MG TABLET: 10 | 30 days supply | Qty: 60 | Fill #2

## 2018-11-17 MED FILL — LISINOPRIL 10 MG TABS: 10 | 30 days supply | Qty: 30 | Fill #2

## 2018-11-17 MED FILL — metFORMIN HCL 1000 MG TABS: 1000 | 30 days supply | Qty: 60 | Fill #8

## 2018-11-17 MED FILL — ?HYDROCHLOROTHIAZIDE 25MG T: 25 | 30 days supply | Qty: 30 | Fill #2

## 2018-11-17 MED FILL — ?ATORVASTATIN 10 MG TABLET: 10 | 30 days supply | Qty: 30 | Fill #2

## 2018-11-26 ENCOUNTER — Encounter: Payer: Self-pay | Admitting: Family Medicine

## 2018-11-26 ENCOUNTER — Other Ambulatory Visit: Payer: Self-pay

## 2018-11-26 ENCOUNTER — Ambulatory Visit (INDEPENDENT_AMBULATORY_CARE_PROVIDER_SITE_OTHER): Payer: Self-pay | Admitting: Family Medicine

## 2018-11-26 VITALS — BP 142/62 | HR 88 | Temp 97.6°F | Ht 61.0 in | Wt 142.8 lb

## 2018-11-26 DIAGNOSIS — R7309 Other abnormal glucose: Secondary | ICD-10-CM | POA: Insufficient documentation

## 2018-11-26 DIAGNOSIS — Z794 Long term (current) use of insulin: Secondary | ICD-10-CM

## 2018-11-26 DIAGNOSIS — E1169 Type 2 diabetes mellitus with other specified complication: Secondary | ICD-10-CM

## 2018-11-26 DIAGNOSIS — Z09 Encounter for follow-up examination after completed treatment for conditions other than malignant neoplasm: Secondary | ICD-10-CM

## 2018-11-26 DIAGNOSIS — M25542 Pain in joints of left hand: Secondary | ICD-10-CM

## 2018-11-26 DIAGNOSIS — R600 Localized edema: Secondary | ICD-10-CM

## 2018-11-26 DIAGNOSIS — R739 Hyperglycemia, unspecified: Secondary | ICD-10-CM | POA: Insufficient documentation

## 2018-11-26 DIAGNOSIS — M25541 Pain in joints of right hand: Secondary | ICD-10-CM

## 2018-11-26 DIAGNOSIS — R809 Proteinuria, unspecified: Secondary | ICD-10-CM | POA: Insufficient documentation

## 2018-11-26 LAB — POCT URINALYSIS DIPSTICK
Bilirubin, UA: NEGATIVE
Glucose, UA: POSITIVE — AB
Leukocytes, UA: NEGATIVE
Nitrite, UA: NEGATIVE
Protein, UA: POSITIVE — AB
Spec Grav, UA: >=1.03 — AB (ref 1.010–1.025)
Urobilinogen, UA: 0.2 E.U./dL
pH, UA: 6 (ref 5.0–8.0)

## 2018-11-26 LAB — POCT GLYCOSYLATED HEMOGLOBIN (HGB A1C): Hemoglobin A1C: 7 % — AB (ref 4.0–5.6)

## 2018-11-26 LAB — GLUCOSE, POCT (MANUAL RESULT ENTRY): POC Glucose: 238 mg/dl — AB (ref 70–99)

## 2018-11-26 NOTE — Progress Notes (Signed)
[facility]  Established Patient Office Visit  Subjective:  Patient ID: Jill Jefferson, female    DOB: 25-Feb-1960  Age: 58 y.o. MRN: [id number]  CC:  Chief Complaint  Patient presents with  . Follow-up    DM   HPI Chanita Boden is a 58 year old female who presents for Follow Up today.   Past Medical History:  Diagnosis Date  . Diabetes ([place])   . Edema   . Hypertension   . Proteinuria 11/2018   Current Status: Since her last office visit, she is doing well with no complaints. Her most recent normal range of preprandial blood glucose levels have been between 135-165. She has seen low range of 99 and high of 165 since his last office visit. She denies fatigue, frequent urination, blurred vision, excessive hunger, excessive thirst, weight gain, weight loss, and poor wound healing. She continues to check her feet regularly. She denies visual changes, chest pain, cough, shortness of breath, heart palpitations, and falls. She has occasional headaches and dizziness with position changes. Denies severe headaches, confusion, seizures, double vision, and blurred vision, nausea and vomiting. She denies fevers, chills, recent infections, weight loss, and night sweats. No reports of GI problems such as nausea, vomiting, diarrhea, and constipation. She has no reports of blood in stools, dysuria and hematuria. No depression or anxiety reported. She denies suicidal ideations, homicidal ideations, or auditory hallucinations. She denies pain today. She is expecting her first grandchild in March 2021.  History reviewed. No pertinent surgical history.  Family History  Problem Relation Age of Onset  . Diabetes Mother   . Hypertension Mother     Social History   Socioeconomic History  . Marital status: Single    Spouse name: Not on file  . Number of children: Not on file  . Years of education: Not on file  . Highest education level: Not on file   Occupational History  . Not on file  Social Needs  . Financial resource strain: Not on file  . Food insecurity    Worry: Not on file    Inability: Not on file  . Transportation needs    Medical: Not on file    Non-medical: Not on file  Tobacco Use  . Smoking status: Never Smoker  . Smokeless tobacco: Never Used  Substance and Sexual Activity  . Alcohol use: Never    Frequency: Never  . Drug use: Never  . Sexual activity: Not Currently  Lifestyle  . Physical activity    Days per week: Not on file    Minutes per session: Not on file  . Stress: Not on file  Relationships  . Social Herbalist on phone: Not on file    Gets together: Not on file    Attends religious service: Not on file    Active member of club or organization: Not on file    Attends meetings of clubs or organizations: Not on file    Relationship status: Not on file  . Intimate partner violence    Fear of current or ex partner: Not on file    Emotionally abused: Not on file    Physically abused: Not on file    Forced sexual activity: Not on file  Other Topics Concern  . Not on file  Social History Narrative  . Not on file    Outpatient Medications Prior to Visit  Medication Sig Dispense Refill  . acetaminophen (TYLENOL)  500 MG tablet Take 1 tablet (500 mg total) by mouth 2 (two) times a day. 60 tablet 3  . amLODipine (NORVASC) 10 MG tablet Take 1 tablet (10 mg total) by mouth daily. 30 tablet 3  . atorvastatin (LIPITOR) 10 MG tablet TAKE 1 TABLET (10 MG TOTAL) BY MOUTH DAILY. 30 tablet 3  . blood glucose meter kit and supplies Dispense based on patient and insurance preference. Use up to four times daily as directed. (FOR ICD-10 E10.9, E11.9). 1 each 0  . calcium-vitamin D (OSCAL WITH D) 250-125 MG-UNIT tablet Take 1 tablet by mouth daily.    . cholecalciferol (VITAMIN D3) 25 MCG (1000 UT) tablet Take 1,000 Units by mouth daily.    . furosemide (LASIX) 20 MG tablet TAKE 1 TABLET BY MOUTH DAILY.  30 tablet 3  . glipiZIDE (GLUCOTROL) 10 MG tablet Take 1 tablet (10 mg total) by mouth 2 (two) times daily before a meal. 60 tablet 3  . hydrochlorothiazide (HYDRODIURIL) 25 MG tablet TAKE 1 TABLET (25 MG TOTAL) BY MOUTH DAILY. 30 tablet 3  . insulin glargine (LANTUS) 100 UNIT/ML injection Inject 0.2 mLs (20 Units total) into the skin at bedtime. 10 mL 11  . lisinopril (ZESTRIL) 10 MG tablet TAKE 1 TABLET (10 MG TOTAL) BY MOUTH DAILY. 30 tablet 3  . magnesium 30 MG tablet Take 30 mg by mouth 2 (two) times daily.    . metFORMIN (GLUCOPHAGE) 1000 MG tablet Take 1 tablet (1,000 mg total) by mouth 2 (two) times daily with a meal. 180 tablet 3  . potassium chloride (K-DUR,KLOR-CON) 10 MEQ tablet Take 10 mEq by mouth once.    . vitamin E 100 UNIT capsule Take 200 Units by mouth daily.     No facility-administered medications prior to visit.     No Known Allergies  ROS Review of Systems  Constitutional: Negative.   HENT: Negative.   Eyes: Negative.   Respiratory: Negative.   Cardiovascular: Negative.   Gastrointestinal: Negative.   Endocrine: Negative.   Genitourinary: Negative.   Musculoskeletal: Negative.   Skin: Negative.   Allergic/Immunologic: Negative.   Neurological: Positive for dizziness (occasional ) and headaches (occasional ).  Hematological: Negative.   Psychiatric/Behavioral: Negative.       Objective:    Physical Exam  Constitutional: She is oriented to person, place, and time. She appears well-developed and well-nourished.  HENT:  Head: Normocephalic and atraumatic.  Eyes: Conjunctivae are normal.  Neck: Normal range of motion. Neck supple.  Cardiovascular: Normal rate, regular rhythm, normal heart sounds and intact distal pulses.  Pulmonary/Chest: Effort normal and breath sounds normal.  Abdominal: Soft. Bowel sounds are normal.  Musculoskeletal: Normal range of motion.        General: Edema (bilateral lower extremity edema) present.  Neurological: She is  alert and oriented to person, place, and time.  Skin: Skin is warm and dry.  Psychiatric: She has a normal mood and affect. Her behavior is normal. Judgment and thought content normal.  Nursing note and vitals reviewed.   BP (!) 179/75 Comment: NO BP MED TAKEN TODAY. PATIENT WILL TAKE BP MED NOW.  Pulse 88   Temp 97.6 F (36.4 C) (Oral)   Ht _0  (1.549 m)   Wt 142 lb 12.8 oz (64.8 kg)   SpO2 100%   BMI 26.98 kg/m  Wt Readings from Last 3 Encounters:  11/26/18 142 lb 12.8 oz (64.8 kg)  08/01/18 137 lb (62.1 kg)  05/02/18 142 lb 4.8 oz (64.5  kg)     Health Maintenance Due  Topic Date Due  . Hepatitis C Screening  1963/01[birth year]05  . OPHTHALMOLOGY EXAM  02/16/1972  . HIV Screening  02/15/1977  . MAMMOGRAM  02/16/2012  . COLONOSCOPY  02/16/2012  . INFLUENZA VACCINE  08/09/2018    There are no preventive care reminders to display for this patient.  Lab Results  Component Value Date   TSH 1.150 01/14/2018   Lab Results  Component Value Date   WBC 5.8 01/14/2018   HGB 11.6 01/14/2018   HCT 33.9 (L) 01/14/2018   MCV 80 01/14/2018   PLT 239 01/14/2018   Lab Results  Component Value Date   NA 135 01/14/2018   K 4.6 01/14/2018   CO2 25 01/14/2018   GLUCOSE 371 (H) 01/14/2018   BUN 22 01/14/2018   CREATININE 1.64 (H) 01/14/2018   BILITOT 0.2 01/14/2018   ALKPHOS 119 (H) 01/14/2018   AST 17 01/14/2018   ALT 16 01/14/2018   PROT 5.8 (L) 01/14/2018   ALBUMIN 3.1 (L) 01/14/2018   CALCIUM 9.2 01/14/2018   Lab Results  Component Value Date   CHOL 305 (H) 01/14/2018   Lab Results  Component Value Date   HDL 87 01/14/2018   Lab Results  Component Value Date   LDLCALC 197 (H) 01/14/2018   Lab Results  Component Value Date   TRIG 104 01/14/2018   Lab Results  Component Value Date   CHOLHDL 3.5 01/14/2018   Lab Results  Component Value Date   HGBA1C 7.0 (A) 11/26/2018      Assessment & Plan:   1. Type 2 diabetes mellitus with other specified  complication, with long-term current use of insulin (HCC) Glucose is stable at 238 today. She will continue medication as prescribed, to decrease foods/beverages high in sugars and carbs and follow Heart Healthy or DASH diet. Increase physical activity to at least 30 minutes cardio exercise daily.  - Urinalysis Dipstick - POCT HgB A1C - Glucose (CBG)  2. Hemoglobin A1c less than 7.0% Much improved. The current medical regimen is effective; Hgb A1c is decreased at 7.0 today, from 12.5 on 08/01/2018; continue present plan and medications as prescribed.  She will continue medication as prescribed, to decrease foods/beverages high in sugars and carbs and follow Heart Healthy or DASH diet. Increase physical activity to at least 30 minutes cardio exercise daily.   3. Hyperglycemia Blood glucose is stable at 238 today.   4. Bilateral lower extremity edema Mild today. She will continue fluid pills as prescribed.   5. Joint pain in fingers of both hands  6. Proteinuria, unspecified type - Microalbumin/Creatinine Ratio, Urine  7. Follow up She will follow up in 3 months.   No orders of the defined types were placed in this encounter.   Orders Placed This Encounter  Procedures  . Microalbumin/Creatinine Ratio, Urine  . Urinalysis Dipstick  . POCT HgB A1C  . Glucose (CBG)    Referral Orders  No referral(s) requested today    Kathe Becton,  MSN, FNP-BC [address] [place], [place] [address] [phone]- fax  Problem List Items Addressed This Visit      Endocrine   Diabetes mellitus ([place]) - Primary   Relevant Orders   Urinalysis Dipstick (Completed)   POCT HgB A1C (Completed)   Glucose (CBG) (Completed)     Other   Bilateral lower extremity edema   Joint pain in  fingers of both hands    Other Visit Diagnoses    Hemoglobin A1c less than 7.0%       Hyperglycemia       Follow  up       Proteinuria, unspecified type       Relevant Orders   Microalbumin/Creatinine Ratio, Urine      No orders of the defined types were placed in this encounter.   Follow-up: Return in about 3 months (around 02/26/2019).    [name], FNP

## 2018-11-27 LAB — MICROALBUMIN / CREATININE URINE RATIO
Creatinine, Urine: 147 mg/dL
Microalb/Creat Ratio: 1842 mg/g creat — ABNORMAL HIGH (ref 0–29)
Microalbumin, Urine: 2707.7 ug/mL

## 2018-11-28 ENCOUNTER — Telehealth: Payer: Self-pay | Admitting: Family Medicine

## 2018-11-28 NOTE — Telephone Encounter (Signed)
Attempted to contact patient to review most recent lab results.

## 2018-12-03 ENCOUNTER — Telehealth: Payer: Self-pay | Admitting: Family Medicine

## 2018-12-03 NOTE — Telephone Encounter (Signed)
Multiple attempts to contact patient to review most recent lab results. Message left on voicemail to return call to office.

## 2018-12-15 MED FILL — ?GLIPIZIDE 10 MG TABLET: 10 | 30 days supply | Qty: 60 | Fill #3

## 2018-12-15 MED FILL — LISINOPRIL 10 MG TABS: 10 | 30 days supply | Qty: 30 | Fill #3

## 2018-12-15 MED FILL — ?HYDROCHLOROTHIAZIDE 25MG T: 25 | 30 days supply | Qty: 30 | Fill #3

## 2018-12-15 MED FILL — ?ATORVASTATIN 10 MG TABLET: 10 | 30 days supply | Qty: 30 | Fill #3

## 2018-12-15 MED FILL — metFORMIN HCL 1000 MG TABS: 1000 | 30 days supply | Qty: 60 | Fill #9

## 2018-12-19 MED FILL — $LANTUS 100 UNITS/ML VIAL: 100 | 80 days supply | Qty: 40 | Fill #3

## 2019-01-19 ENCOUNTER — Other Ambulatory Visit: Payer: Self-pay | Admitting: Family Medicine

## 2019-01-19 DIAGNOSIS — E785 Hyperlipidemia, unspecified: Secondary | ICD-10-CM

## 2019-01-19 MED FILL — ?HYDROCHLOROTHIAZIDE 25MG T: 25 | 30 days supply | Qty: 30 | Fill #0

## 2019-01-19 MED FILL — ?GLIPIZIDE 10 MG TABLET: 10 | 30 days supply | Qty: 60 | Fill #3

## 2019-01-19 MED FILL — ?ATORVASTATIN 10 MG TABLET: 10 | 30 days supply | Qty: 30 | Fill #0

## 2019-01-19 MED FILL — $LANTUS 100 UNITS/ML VIAL: 100 | 80 days supply | Qty: 40 | Fill #3

## 2019-01-19 MED FILL — metFORMIN HCL 1000 MG TABS: 1000 | 30 days supply | Qty: 60 | Fill #10

## 2019-01-19 MED FILL — LISINOPRIL 10 MG TABS: 10 | 30 days supply | Qty: 30 | Fill #0

## 2019-02-16 ENCOUNTER — Other Ambulatory Visit: Payer: Self-pay | Admitting: Family Medicine

## 2019-02-16 DIAGNOSIS — Z794 Long term (current) use of insulin: Secondary | ICD-10-CM

## 2019-02-16 DIAGNOSIS — E1169 Type 2 diabetes mellitus with other specified complication: Secondary | ICD-10-CM

## 2019-02-16 MED FILL — metFORMIN HCL 1000 MG TABS: 1000 | 180 days supply | Qty: 180 | Fill #0

## 2019-02-16 MED FILL — glipiZIDE 10 MG TABS: 10 | 30 days supply | Qty: 60 | Fill #0

## 2019-02-16 MED FILL — LISINOPRIL 10 MG TABS: 10 | 30 days supply | Qty: 30 | Fill #1

## 2019-02-16 MED FILL — ?HYDROCHLOROTHIAZIDE 25MG T: 25 | 30 days supply | Qty: 30 | Fill #1

## 2019-02-16 MED FILL — ?ATORVASTATIN 10 MG TABLET: 10 | 30 days supply | Qty: 30 | Fill #1

## 2019-02-25 ENCOUNTER — Ambulatory Visit (INDEPENDENT_AMBULATORY_CARE_PROVIDER_SITE_OTHER): Payer: Self-pay | Admitting: Family Medicine

## 2019-02-25 ENCOUNTER — Other Ambulatory Visit: Payer: Self-pay

## 2019-02-25 ENCOUNTER — Encounter: Payer: Self-pay | Admitting: Family Medicine

## 2019-02-25 VITALS — BP 161/78 | HR 82 | Temp 98.2°F | Ht 61.0 in | Wt 137.2 lb

## 2019-02-25 DIAGNOSIS — R739 Hyperglycemia, unspecified: Secondary | ICD-10-CM

## 2019-02-25 DIAGNOSIS — E1169 Type 2 diabetes mellitus with other specified complication: Secondary | ICD-10-CM

## 2019-02-25 DIAGNOSIS — R7309 Other abnormal glucose: Secondary | ICD-10-CM

## 2019-02-25 DIAGNOSIS — Z794 Long term (current) use of insulin: Secondary | ICD-10-CM

## 2019-02-25 DIAGNOSIS — R600 Localized edema: Secondary | ICD-10-CM

## 2019-02-25 DIAGNOSIS — Z09 Encounter for follow-up examination after completed treatment for conditions other than malignant neoplasm: Secondary | ICD-10-CM

## 2019-02-25 LAB — POCT URINALYSIS DIPSTICK
Bilirubin, UA: NEGATIVE
Glucose, UA: NEGATIVE
Leukocytes, UA: NEGATIVE
Nitrite, UA: NEGATIVE
Protein, UA: POSITIVE — AB
Spec Grav, UA: >=1.03 — AB (ref 1.010–1.025)
Urobilinogen, UA: 0.2 E.U./dL
pH, UA: 6 (ref 5.0–8.0)

## 2019-02-25 LAB — POCT GLYCOSYLATED HEMOGLOBIN (HGB A1C): Hemoglobin A1C: 6.7 % — AB (ref 4.0–5.6)

## 2019-02-25 LAB — GLUCOSE, POCT (MANUAL RESULT ENTRY): POC Glucose: 192 mg/dl — AB (ref 70–99)

## 2019-02-25 MED ORDER — METFORMIN HCL 500 MG PO TABS: 500.0000 mg | ORAL_TABLET | Freq: Two times a day (BID) | ORAL | 3 refills | Status: DC

## 2019-02-25 MED FILL — metFORMIN HCL 500 MG TABS: 500 | 30 days supply | Qty: 60 | Fill #0

## 2019-02-25 NOTE — Progress Notes (Signed)
[facility]    Established Patient Office Visit  Subjective:  Patient ID: Jill Jefferson, female    DOB: [birth year]  Age: 59 y.o. MRN: [id number]  CC:  Chief Complaint  Patient presents with  . Follow-up    DM    HPI Jill Jefferson is a 59 year old female presents for Follow Up today.   Past Medical History:  Diagnosis Date  . Diabetes ([place])   . Edema   . Hypertension   . Proteinuria 11/2018   Current Status: Since her last office visit, she is doing well with no complaints. She has not been monitoring her glucose levels regularly lately. She denies fatigue, frequent urination, blurred vision, excessive hunger, excessive thirst, weight gain, weight loss, and poor wound healing. She continues to check her feet regularly. She denies visual changes, chest pain, cough, shortness of breath, heart palpitations, and falls. She has occasional headaches and dizziness with position changes. Denies severe headaches, confusion, seizures, double vision, and blurred vision, nausea and vomiting. She denies fevers, chills, recent infections, weight loss, and night sweats. No reports of GI problems such as diarrhea, and constipation. She has no reports of blood in stools, dysuria and hematuria. No depression or anxiety, and denies suicidal ideations, homicidal ideations, or auditory hallucinations. She denies pain today.   History reviewed. No pertinent surgical history.  Family History  Problem Relation Age of Onset  . Diabetes Mother   . Hypertension Mother     Social History   Socioeconomic History  . Marital status: Single    Spouse name: Not on file  . Number of children: Not on file  . Years of education: Not on file  . Highest education level: Not on file  Occupational History  . Not on file  Tobacco Use  . Smoking status: Never Smoker  . Smokeless tobacco: Never Used  Substance and Sexual Activity  . Alcohol use: Never  .  Drug use: Never  . Sexual activity: Not Currently  Other Topics Concern  . Not on file  Social History Narrative  . Not on file   Social Determinants of Health   Financial Resource Strain:   . Difficulty of Paying Living Expenses: Not on file  Food Insecurity:   . Worried About Charity fundraiser in the Last Year: Not on file  . Ran Out of Food in the Last Year: Not on file  Transportation Needs:   . Lack of Transportation (Medical): Not on file  . Lack of Transportation (Non-Medical): Not on file  Physical Activity:   . Days of Exercise per Week: Not on file  . Minutes of Exercise per Session: Not on file  Stress:   . Feeling of Stress : Not on file  Social Connections:   . Frequency of Communication with Friends and Family: Not on file  . Frequency of Social Gatherings with Friends and Family: Not on file  . Attends Religious Services: Not on file  . Active Member of Clubs or Organizations: Not on file  . Attends Archivist Meetings: Not on file  . Marital Status: Not on file  Intimate Partner Violence:   . Fear of Current or Ex-Partner: Not on file  . Emotionally Abused: Not on file  . Physically Abused: Not on file  . Sexually Abused: Not on file    Outpatient Medications Prior to Visit  Medication Sig Dispense Refill  . acetaminophen (TYLENOL) 500 MG tablet Take  1 tablet (500 mg total) by mouth 2 (two) times a day. 60 tablet 3  . amLODipine (NORVASC) 10 MG tablet Take 1 tablet (10 mg total) by mouth daily. 30 tablet 3  . atorvastatin (LIPITOR) 10 MG tablet TAKE 1 TABLET (10 MG TOTAL) BY MOUTH DAILY. 30 tablet 3  . blood glucose meter kit and supplies Dispense based on patient and insurance preference. Use up to four times daily as directed. (FOR ICD-10 E10.9, E11.9). 1 each 0  . calcium-vitamin D (OSCAL WITH D) 250-125 MG-UNIT tablet Take 1 tablet by mouth daily.    . cholecalciferol (VITAMIN D3) 25 MCG (1000 UT) tablet Take 1,000 Units by mouth daily.    .  furosemide (LASIX) 20 MG tablet TAKE 1 TABLET BY MOUTH DAILY. 30 tablet 3  . glipiZIDE (GLUCOTROL) 10 MG tablet TAKE 1 TABLET (10 MG TOTAL) BY MOUTH 2 (TWO) TIMES DAILY BEFORE A MEAL. 60 tablet 3  . hydrochlorothiazide (HYDRODIURIL) 25 MG tablet TAKE 1 TABLET (25 MG TOTAL) BY MOUTH DAILY. 30 tablet 3  . lisinopril (ZESTRIL) 10 MG tablet TAKE 1 TABLET (10 MG TOTAL) BY MOUTH DAILY. 30 tablet 3  . magnesium 30 MG tablet Take 30 mg by mouth 2 (two) times daily.    . potassium chloride (K-DUR,KLOR-CON) 10 MEQ tablet Take 10 mEq by mouth once.    . vitamin E 100 UNIT capsule Take 200 Units by mouth daily.    . metFORMIN (GLUCOPHAGE) 1000 MG tablet Take 1 tablet (1,000 mg total) by mouth 2 (two) times daily with a meal. 180 tablet 3  . insulin glargine (LANTUS) 100 UNIT/ML injection Inject 0.2 mLs (20 Units total) into the skin at bedtime. (Patient not taking: Reported on 02/25/2019) 10 mL 11   No facility-administered medications prior to visit.   Patient Active Problem List   Diagnosis Date Noted  . Hemoglobin A1c less than 7.0% 11/26/2018  . Hyperglycemia 11/26/2018  . Proteinuria 11/26/2018  . Joint pain in fingers of both hands 08/03/2018  . Bilateral lower extremity edema 05/03/2018  . Diabetes mellitus ([place]) 01/31/2018  . Age-related cataract of both eyes 01/31/2018  . Elevated glucose 01/31/2018    No Known Allergies  ROS Review of Systems  Constitutional: Negative.   HENT: Negative.   Eyes: Negative.   Respiratory: Negative.   Cardiovascular: Negative.   Gastrointestinal: Negative.   Endocrine: Negative.   Genitourinary: Negative.   Musculoskeletal: Negative.   Skin: Negative.   Allergic/Immunologic: Negative.   Neurological: Positive for dizziness (occasional ) and headaches (occasional ).  Hematological: Negative.   Psychiatric/Behavioral: Negative.       Objective:    Physical Exam  Constitutional: She is oriented to person, place, and time. She appears  well-developed and well-nourished.  HENT:  Head: Normocephalic and atraumatic.  Eyes: Conjunctivae are normal.  Cardiovascular: Normal rate, regular rhythm, normal heart sounds and intact distal pulses.  Pulmonary/Chest: Effort normal and breath sounds normal.  Abdominal: Soft. Bowel sounds are normal.  Musculoskeletal:        General: Normal range of motion.     Cervical back: Normal range of motion and neck supple.  Neurological: She is alert and oriented to person, place, and time. She has normal reflexes.  Skin: Skin is warm and dry.  Psychiatric: She has a normal mood and affect. Her behavior is normal. Judgment and thought content normal.  Nursing note and vitals reviewed.   BP (!) 161/78   Pulse 82   Temp 98.2 F (  36.8 C) (Oral)   Ht '5\' 1"'  (1.549 m)   Wt 137 lb 3.2 oz (62.2 kg)   SpO2 100%   BMI 25.92 kg/m  Wt Readings from Last 3 Encounters:  02/25/19 137 lb 3.2 oz (62.2 kg)  11/26/18 142 lb 12.8 oz (64.8 kg)  08/01/18 137 lb (62.1 kg)     Health Maintenance Due  Topic Date Due  . Hepatitis C Screening  19-Jun-1960  . OPHTHALMOLOGY EXAM  02/16/1972  . HIV Screening  02/15/1977  . MAMMOGRAM  02/16/2012  . COLONOSCOPY  02/16/2012  . INFLUENZA VACCINE  08/09/2018  . PAP SMEAR-Modifier  04/09/2019    There are no preventive care reminders to display for this patient.  Lab Results  Component Value Date   TSH 1.150 01/14/2018   Lab Results  Component Value Date   WBC 5.8 01/14/2018   HGB 11.6 01/14/2018   HCT 33.9 (L) 01/14/2018   MCV 80 01/14/2018   PLT 239 01/14/2018   Lab Results  Component Value Date   NA 135 01/14/2018   K 4.6 01/14/2018   CO2 25 01/14/2018   GLUCOSE 371 (H) 01/14/2018   BUN 22 01/14/2018   CREATININE 1.64 (H) 01/14/2018   BILITOT 0.2 01/14/2018   ALKPHOS 119 (H) 01/14/2018   AST 17 01/14/2018   ALT 16 01/14/2018   PROT 5.8 (L) 01/14/2018   ALBUMIN 3.1 (L) 01/14/2018   CALCIUM 9.2 01/14/2018   Lab Results  Component  Value Date   CHOL 305 (H) 01/14/2018   Lab Results  Component Value Date   HDL 87 01/14/2018   Lab Results  Component Value Date   LDLCALC 197 (H) 01/14/2018   Lab Results  Component Value Date   TRIG 104 01/14/2018   Lab Results  Component Value Date   CHOLHDL 3.5 01/14/2018   Lab Results  Component Value Date   HGBA1C 6.7 (A) 02/25/2019    Assessment & Plan:   1. Type 2 diabetes mellitus with other specified complication, with long-term current use of insulin (HCC) She will hold insulin for now. We will decrease Metformin dose to 500 mg BID. She will continue medication as prescribed, to decrease foods/beverages high in sugars and carbs and follow Heart Healthy or DASH diet. Increase physical activity to at least 30 minutes cardio exercise daily. - POCT urinalysis dipstick - POCT glycosylated hemoglobin (Hb A1C) - POCT glucose (manual entry) - metFORMIN (GLUCOPHAGE) 500 MG tablet; Take 1 tablet (500 mg total) by mouth 2 (two) times daily with a meal.  Dispense: 180 tablet; Refill: 3  2. Hyperglycemia  3. Hemoglobin A1c less than 7.0% Hgb A1c is stable at 6.7 today. Continue to monitor.  - metFORMIN (GLUCOPHAGE) 500 MG tablet; Take 1 tablet (500 mg total) by mouth 2 (two) times daily with a meal.  Dispense: 180 tablet; Refill: 3  4. Bilateral lower extremity edema Stable today.   5. Follow up She will follow up in 1 month for evaluation of peripheral edema and blood pressure.  Meds ordered this encounter  Medications  . metFORMIN (GLUCOPHAGE) 500 MG tablet    Sig: Take 1 tablet (500 mg total) by mouth 2 (two) times daily with a meal.    Dispense:  180 tablet    Refill:  3    Orders Placed This Encounter  Procedures  . POCT urinalysis dipstick  . POCT glycosylated hemoglobin (Hb A1C)  . POCT glucose (manual entry)    Referral Orders  No referral(s) requested  today    Kathe Becton,  MSN, FNP-BC [address] [place], [place] [address] [phone]- fax   Problem List Items Addressed This Visit      Endocrine   Diabetes mellitus ([place]) - Primary   Relevant Medications   metFORMIN (GLUCOPHAGE) 500 MG tablet   Other Relevant Orders   POCT urinalysis dipstick (Completed)   POCT glycosylated hemoglobin (Hb A1C) (Completed)   POCT glucose (manual entry) (Completed)     Other   Bilateral lower extremity edema   Hemoglobin A1c less than 7.0%   Relevant Medications   metFORMIN (GLUCOPHAGE) 500 MG tablet   Hyperglycemia    Other Visit Diagnoses    Follow up          Meds ordered this encounter  Medications  . metFORMIN (GLUCOPHAGE) 500 MG tablet    Sig: Take 1 tablet (500 mg total) by mouth 2 (two) times daily with a meal.    Dispense:  180 tablet    Refill:  3    Follow-up: No follow-ups on file.    [name], FNP

## 2019-03-16 MED FILL — ?ATORVASTATIN CALCIUM 10MG: 10 | 30 days supply | Qty: 30 | Fill #2

## 2019-03-16 MED FILL — LISINOPRIL 10 MG TABS: 10 | 30 days supply | Qty: 30 | Fill #2

## 2019-03-16 MED FILL — ?HYDROCHLOROTHIAZIDE 25MG T: 25 | 30 days supply | Qty: 30 | Fill #2

## 2019-03-25 ENCOUNTER — Other Ambulatory Visit: Payer: Self-pay

## 2019-03-27 ENCOUNTER — Other Ambulatory Visit: Payer: Self-pay

## 2019-03-27 ENCOUNTER — Ambulatory Visit: Payer: Medicaid Other

## 2019-03-27 VITALS — BP 136/68 | HR 82

## 2019-03-27 DIAGNOSIS — I10 Essential (primary) hypertension: Secondary | ICD-10-CM

## 2019-03-27 NOTE — Progress Notes (Signed)
Nurse visit only BP recheck:  Patient reported taking her blood pressure medication about one hour ago. Current reading is 138/68, HR 82.

## 2019-04-17 MED FILL — LISINOPRIL 10 MG TABS: 10 | 30 days supply | Qty: 30 | Fill #3

## 2019-04-17 MED FILL — ?GLIPIZIDE 10 MG TABLET: 10 | 30 days supply | Qty: 60 | Fill #1

## 2019-04-17 MED FILL — ?ATORVASTATIN CALCIUM 10MG: 10 | 30 days supply | Qty: 30 | Fill #3

## 2019-04-17 MED FILL — ?HYDROCHLOROTHIAZIDE 25MG T: 25 | 30 days supply | Qty: 30 | Fill #3

## 2019-05-18 ENCOUNTER — Other Ambulatory Visit: Payer: Self-pay | Admitting: Family Medicine

## 2019-05-18 DIAGNOSIS — E785 Hyperlipidemia, unspecified: Secondary | ICD-10-CM

## 2019-05-26 ENCOUNTER — Ambulatory Visit: Payer: Medicaid Other | Admitting: Family Medicine

## 2019-06-09 DIAGNOSIS — A048 Other specified bacterial intestinal infections: Secondary | ICD-10-CM

## 2019-06-09 HISTORY — DX: Other specified bacterial intestinal infections: A04.8

## 2019-06-15 MED FILL — ?ATORVASTATIN 10 MG TABLET: 10 | 30 days supply | Qty: 30 | Fill #1

## 2019-06-23 ENCOUNTER — Other Ambulatory Visit: Payer: Self-pay

## 2019-06-23 ENCOUNTER — Ambulatory Visit (INDEPENDENT_AMBULATORY_CARE_PROVIDER_SITE_OTHER): Payer: Self-pay | Admitting: Family Medicine

## 2019-06-23 ENCOUNTER — Other Ambulatory Visit: Payer: Self-pay | Admitting: Family Medicine

## 2019-06-23 ENCOUNTER — Encounter: Payer: Self-pay | Admitting: Family Medicine

## 2019-06-23 VITALS — BP 148/88 | Temp 97.9°F | Ht 61.0 in | Wt 137.0 lb

## 2019-06-23 DIAGNOSIS — R7309 Other abnormal glucose: Secondary | ICD-10-CM

## 2019-06-23 DIAGNOSIS — Z09 Encounter for follow-up examination after completed treatment for conditions other than malignant neoplasm: Secondary | ICD-10-CM

## 2019-06-23 DIAGNOSIS — M25541 Pain in joints of right hand: Secondary | ICD-10-CM

## 2019-06-23 DIAGNOSIS — E1169 Type 2 diabetes mellitus with other specified complication: Secondary | ICD-10-CM

## 2019-06-23 DIAGNOSIS — M25542 Pain in joints of left hand: Secondary | ICD-10-CM

## 2019-06-23 DIAGNOSIS — R8290 Unspecified abnormal findings in urine: Secondary | ICD-10-CM

## 2019-06-23 DIAGNOSIS — R739 Hyperglycemia, unspecified: Secondary | ICD-10-CM

## 2019-06-23 DIAGNOSIS — Z794 Long term (current) use of insulin: Secondary | ICD-10-CM

## 2019-06-23 DIAGNOSIS — I10 Essential (primary) hypertension: Secondary | ICD-10-CM

## 2019-06-23 DIAGNOSIS — K219 Gastro-esophageal reflux disease without esophagitis: Secondary | ICD-10-CM

## 2019-06-23 DIAGNOSIS — R600 Localized edema: Secondary | ICD-10-CM

## 2019-06-23 DIAGNOSIS — Z1239 Encounter for other screening for malignant neoplasm of breast: Secondary | ICD-10-CM

## 2019-06-23 LAB — POCT GLYCOSYLATED HEMOGLOBIN (HGB A1C)
HbA1c POC (<> result, manual entry): 9.1 % (ref 4.0–5.6)
HbA1c, POC (controlled diabetic range): 9.1 % — AB (ref 0.0–7.0)
HbA1c, POC (prediabetic range): 9.1 % — AB (ref 5.7–6.4)
Hemoglobin A1C: 9.1 % — AB (ref 4.0–5.6)

## 2019-06-23 LAB — POCT URINALYSIS DIPSTICK
Bilirubin, UA: NEGATIVE
Glucose, UA: NEGATIVE
Nitrite, UA: NEGATIVE
Protein, UA: POSITIVE — AB
Spec Grav, UA: >=1.03 — AB (ref 1.010–1.025)
Urobilinogen, UA: 0.2 E.U./dL
pH, UA: 6 (ref 5.0–8.0)

## 2019-06-23 LAB — GLUCOSE, POCT (MANUAL RESULT ENTRY): POC Glucose: 256 mg/dl — AB (ref 70–99)

## 2019-06-23 MED ORDER — METFORMIN HCL 1000 MG PO TABS: 1000.0000 mg | ORAL_TABLET | Freq: Two times a day (BID) | ORAL | 6 refills | Status: DC

## 2019-06-23 NOTE — Progress Notes (Signed)
Patient [facility]    Established Patient Office Visit  Subjective:  Patient ID: Jill Jefferson, female    DOB: 02/27/1960  Age: 59 y.o. MRN: [id number]  CC:  Chief Complaint  Patient presents with  . Follow-up    3 month follow up;     HPI Jill Jefferson is a 59 year old female who presents for Follow Up today.   Patient Active Problem List   Diagnosis Date Noted  . Hemoglobin A1c less than 7.0% 11/26/2018  . Hyperglycemia 11/26/2018  . Proteinuria 11/26/2018  . Joint pain in fingers of both hands 08/03/2018  . Bilateral lower extremity edema 05/03/2018  . Diabetes mellitus ([place]) 01/31/2018  . Age-related cataract of both eyes 01/31/2018  . Elevated glucose 01/31/2018   Patient Active Problem List   Diagnosis Date Noted  . Hemoglobin A1c less than 7.0% 11/26/2018  . Hyperglycemia 11/26/2018  . Proteinuria 11/26/2018  . Joint pain in fingers of both hands 08/03/2018  . Bilateral lower extremity edema 05/03/2018  . Diabetes mellitus ([place]) 01/31/2018  . Age-related cataract of both eyes 01/31/2018  . Elevated glucose 01/31/2018   Past Medical History:  Diagnosis Date  . Diabetes ([place])   . Edema   . Hypertension   . Proteinuria 11/2018   Current Status: Since her last office visit, she is doing well with no complaints. She denies fatigue, frequent urination, blurred vision, excessive hunger, excessive thirst, weight gain, weight loss, and poor wound healing. She continues to check her feet regularly. She denies visual changes, chest pain, cough, shortness of breath, heart palpitations, and falls. She has occasional headaches and dizziness with position changes. Denies severe headaches, confusion, seizures, doSheuble vision, and blurred vision, nausea and vomiting.  denies fevers, chills, recent infections, weight loss, and night sweats. Denies GI problems such as diarrhea, and constipation. She has no reports of blood in stools,  dysuria and hematuria. No depression or anxiety, and denies suicidal ideations, homicidal ideations, or auditory hallucinations. She is taking all medications as prescribed. She denies pain today.   No past surgical history on file.  Family History  Problem Relation Age of Onset  . Diabetes Mother   . Hypertension Mother     Social History   Socioeconomic History  . Marital status: Single    Spouse name: Not on file  . Number of children: Not on file  . Years of education: Not on file  . Highest education level: Not on file  Occupational History  . Not on file  Tobacco Use  . Smoking status: Never Smoker  . Smokeless tobacco: Never Used  Vaping Use  . Vaping Use: Never used  Substance and Sexual Activity  . Alcohol use: Never  . Drug use: Never  . Sexual activity: Not Currently  Other Topics Concern  . Not on file  Social History Narrative  . Not on file   Social Determinants of Health   Financial Resource Strain:   . Difficulty of Paying Living Expenses:   Food Insecurity:   . Worried About Charity fundraiser in the Last Year:   . Arboriculturist in the Last Year:   Transportation Needs:   . Film/video editor (Medical):   Marland Kitchen Lack of Transportation (Non-Medical):   Physical Activity:   . Days of Exercise per Week:   . Minutes of Exercise per Session:   Stress:   . Feeling of Stress :   Social Connections:   .  Frequency of Communication with Friends and Family:   . Frequency of Social Gatherings with Friends and Family:   . Attends Religious Services:   . Active Member of Clubs or Organizations:   . Attends Archivist Meetings:   Marland Kitchen Marital Status:   Intimate Partner Violence:   . Fear of Current or Ex-Partner:   . Emotionally Abused:   Marland Kitchen Physically Abused:   . Sexually Abused:     Outpatient Medications Prior to Visit  Medication Sig Dispense Refill  . acetaminophen (TYLENOL) 500 MG tablet Take 1 tablet (500 mg total) by mouth 2 (two)  times a day. 60 tablet 3  . amLODipine (NORVASC) 10 MG tablet Take 1 tablet (10 mg total) by mouth daily. 30 tablet 3  . atorvastatin (LIPITOR) 10 MG tablet TAKE 1 TABLET (10 MG TOTAL) BY MOUTH DAILY. 30 tablet 3  . blood glucose meter kit and supplies Dispense based on patient and insurance preference. Use up to four times daily as directed. (FOR ICD-10 E10.9, E11.9). 1 each 0  . calcium-vitamin D (OSCAL WITH D) 250-125 MG-UNIT tablet Take 1 tablet by mouth daily.    . cholecalciferol (VITAMIN D3) 25 MCG (1000 UT) tablet Take 1,000 Units by mouth daily.    Marland Kitchen glipiZIDE (GLUCOTROL) 10 MG tablet TAKE 1 TABLET (10 MG TOTAL) BY MOUTH 2 (TWO) TIMES DAILY BEFORE A MEAL. 60 tablet 3  . hydrochlorothiazide (HYDRODIURIL) 25 MG tablet TAKE 1 TABLET (25 MG TOTAL) BY MOUTH DAILY. 30 tablet 3  . lisinopril (ZESTRIL) 10 MG tablet TAKE 1 TABLET (10 MG TOTAL) BY MOUTH DAILY. 30 tablet 3  . magnesium 30 MG tablet Take 30 mg by mouth 2 (two) times daily.    . potassium chloride (K-DUR,KLOR-CON) 10 MEQ tablet Take 10 mEq by mouth once.    . metFORMIN (GLUCOPHAGE) 500 MG tablet Take 1 tablet (500 mg total) by mouth 2 (two) times daily with a meal. 180 tablet 3  . furosemide (LASIX) 20 MG tablet TAKE 1 TABLET BY MOUTH DAILY. (Patient not taking: Reported on 06/23/2019) 30 tablet 3  . insulin glargine (LANTUS) 100 UNIT/ML injection Inject 0.2 mLs (20 Units total) into the skin at bedtime. (Patient not taking: Reported on 02/25/2019) 10 mL 11  . vitamin E 100 UNIT capsule Take 200 Units by mouth daily. (Patient not taking: Reported on 06/23/2019)     No facility-administered medications prior to visit.    No Known Allergies  ROS Review of Systems  Constitutional: Negative.   HENT: Negative.   Eyes: Negative.   Respiratory: Negative.   Cardiovascular: Negative.   Gastrointestinal: Positive for nausea (occasional ).  Endocrine: Negative.   Genitourinary: Negative.   Musculoskeletal: Negative.   Skin: Negative.    Allergic/Immunologic: Negative.   Neurological: Positive for dizziness (occasional. ) and headaches (occasional).  Hematological: Negative.   Psychiatric/Behavioral: Negative.       Objective:    Physical Exam Vitals and nursing note reviewed.  Constitutional:      Appearance: Normal appearance.  HENT:     Head: Normocephalic and atraumatic.     Mouth/Throat:     Mouth: Mucous membranes are moist.  Cardiovascular:     Rate and Rhythm: Normal rate and regular rhythm.     Pulses: Normal pulses.     Heart sounds: Normal heart sounds.  Pulmonary:     Effort: Pulmonary effort is normal.     Breath sounds: Normal breath sounds.  Abdominal:     General:  Abdomen is flat. Bowel sounds are normal.     Palpations: Abdomen is soft.  Musculoskeletal:        General: Normal range of motion.     Cervical back: Normal range of motion and neck supple.  Skin:    General: Skin is warm and dry.  Neurological:     General: No focal deficit present.     Mental Status: She is alert and oriented to person, place, and time.  Psychiatric:        Mood and Affect: Mood normal.        Behavior: Behavior normal.        Thought Content: Thought content normal.        Judgment: Judgment normal.     BP (!) 148/88 (BP Location: Left Arm, Patient Position: Sitting, Cuff Size: Small)   Temp 97.9 F (36.6 C)   Ht '5\' 1"'  (1.549 m)   Wt 137 lb 0.2 oz (62.1 kg)   SpO2 98%   BMI 25.89 kg/m  Wt Readings from Last 3 Encounters:  06/23/19 137 lb 0.2 oz (62.1 kg)  02/25/19 137 lb 3.2 oz (62.2 kg)  11/26/18 142 lb 12.8 oz (64.8 kg)     Health Maintenance Due  Topic Date Due  . Hepatitis C Screening  Never done  . OPHTHALMOLOGY EXAM  Never done  . COVID-19 Vaccine (1) Never done  . HIV Screening  Never done  . MAMMOGRAM  Never done  . COLONOSCOPY  Never done  . PAP SMEAR-Modifier  04/09/2019    There are no preventive care reminders to display for this patient.  Lab Results  Component Value  Date   TSH 0.780 06/23/2019   Lab Results  Component Value Date   WBC 5.3 06/23/2019   HGB 11.1 06/23/2019   HCT 33.2 (L) 06/23/2019   MCV 81 06/23/2019   PLT 257 06/23/2019   Lab Results  Component Value Date   NA 139 06/23/2019   K 4.2 06/23/2019   CO2 26 06/23/2019   GLUCOSE 233 (H) 06/23/2019   BUN 27 (H) 06/23/2019   CREATININE 1.65 (H) 06/23/2019   BILITOT 0.4 06/23/2019   ALKPHOS 73 06/23/2019   AST 13 06/23/2019   ALT 11 06/23/2019   PROT 6.3 06/23/2019   ALBUMIN 3.9 06/23/2019   CALCIUM 9.6 06/23/2019   Lab Results  Component Value Date   CHOL 176 06/23/2019   Lab Results  Component Value Date   HDL 59 06/23/2019   Lab Results  Component Value Date   LDLCALC 104 (H) 06/23/2019   Lab Results  Component Value Date   TRIG 69 06/23/2019   Lab Results  Component Value Date   CHOLHDL 3.0 06/23/2019   Lab Results  Component Value Date   HGBA1C 9.1 (A) 06/23/2019   HGBA1C 9.1 06/23/2019   HGBA1C 9.1 (A) 06/23/2019   HGBA1C 9.1 (A) 06/23/2019      Assessment & Plan:   1. Type 2 diabetes mellitus with other specified complication, with long-term current use of insulin (HCC) He will continue medication as prescribed, to decrease foods/beverages high in sugars and carbs and follow Heart Healthy or DASH diet. Increase physical activity to at least 30 minutes cardio exercise daily.  - Urinalysis Dipstick - Glucose (CBG) - HgB A1c - metFORMIN (GLUCOPHAGE) 1000 MG tablet; Take 1 tablet (1,000 mg total) by mouth 2 (two) times daily with a meal.  Dispense: 60 tablet; Refill: 6  2. Hemoglobin A1C greater than 9%,  indicating poor diabetic control Increased. Hgb A1c at 9.1 today, from 6.7. Monitor.  3. Hyperglycemia  4. Hypertension, unspecified type The current medical regimen is effective; blood pressure is stable at 148/88 today; continue present plan and medications as prescribed. She will continue to take medications as prescribed, to decrease high  sodium intake, excessive alcohol intake, increase potassium intake, smoking cessation, and increase physical activity of at least 30 minutes of cardio activity daily. She will continue to follow Heart Healthy or DASH diet. - Urinalysis Dipstick - CBC with Differential - Comprehensive metabolic panel - Lipid Panel - TSH - Vitamin B12 - Vitamin D, 25-hydroxy  5. Gastroesophageal reflux disease without esophagitis - H. pylori breath test  6. Bilateral lower extremity edema  7. Joint pain in fingers of both hands - Rheumatoid Arthritis Profile - Arthritis Panel  8. Encounter for screening for malignant neoplasm of breast, unspecified screening modality We will order Mammogram today.   9. Abnormal urinalysis Results are pending.  - Urine Culture  10. Follow up She will follow up as needed.  Meds ordered this encounter  Medications  . metFORMIN (GLUCOPHAGE) 1000 MG tablet    Sig: Take 1 tablet (1,000 mg total) by mouth 2 (two) times daily with a meal.    Dispense:  60 tablet    Refill:  6    Orders Placed This Encounter  Procedures  . Urine Culture  . H. pylori breath test  . CBC with Differential  . Comprehensive metabolic panel  . Lipid Panel  . TSH  . Vitamin B12  . Vitamin D, 25-hydroxy  . Rheumatoid Arthritis Profile  . Arthritis Panel  . Urinalysis Dipstick  . Glucose (CBG)  . HgB A1c   Referral Orders  No referral(s) requested today    Jill Becton,  MSN, FNP-BC [address] [place], [place] [address] [phone] fax  Problem List Items Addressed This Visit      Endocrine   Diabetes mellitus ([place]) - Primary   Relevant Medications   metFORMIN (GLUCOPHAGE) 1000 MG tablet   Other Relevant Orders   Urinalysis Dipstick (Completed)   Glucose (CBG) (Completed)   HgB A1c (Completed)     Other   Bilateral lower extremity edema    Hyperglycemia   Joint pain in fingers of both hands   Relevant Orders   Rheumatoid Arthritis Profile   Arthritis Panel    Other Visit Diagnoses    Hemoglobin A1C greater than 9%, indicating poor diabetic control       Relevant Medications   metFORMIN (GLUCOPHAGE) 1000 MG tablet   Hypertension, unspecified type       Relevant Orders   Urinalysis Dipstick (Completed)   CBC with Differential   Comprehensive metabolic panel   Lipid Panel   TSH   Vitamin B12   Vitamin D, 25-hydroxy   Gastroesophageal reflux disease without esophagitis       Relevant Orders   H. pylori breath test (Completed)   Encounter for screening for malignant neoplasm of breast, unspecified screening modality       Abnormal urinalysis       Relevant Orders   Urine Culture (Completed)   Follow up          Meds ordered this encounter  Medications  . metFORMIN (GLUCOPHAGE) 1000 MG tablet    Sig: Take 1 tablet (1,000 mg total) by mouth 2 (two) times daily with a  meal.    Dispense:  60 tablet    Refill:  6    Follow-up: No follow-ups on file.    [name], FNP

## 2019-06-25 LAB — LIPID PANEL
Chol/HDL Ratio: 3 ratio (ref 0.0–4.4)
Cholesterol, Total: 176 mg/dL (ref 100–199)
HDL: 59 mg/dL (ref >39)
LDL Chol Calc (NIH): 104 mg/dL — ABNORMAL HIGH (ref 0–99)
Triglycerides: 69 mg/dL (ref 0–149)
VLDL Cholesterol Cal: 13 mg/dL (ref 5–40)

## 2019-06-25 LAB — ARTHRITIS PANEL
Anti Nuclear Antibody (ANA): POSITIVE — AB
Rheumatoid fact SerPl-aCnc: <10 IU/mL (ref 0.0–13.9)
Sed Rate: 13 mm/hr (ref 0–40)
Uric Acid: 5.4 mg/dL (ref 3.0–7.2)

## 2019-06-25 LAB — VITAMIN B12: Vitamin B-12: 834 pg/mL (ref 232–1245)

## 2019-06-25 LAB — CBC WITH DIFFERENTIAL/PLATELET
Basophils Absolute: 0 10*3/uL (ref 0.0–0.2)
Basos: 1 %
EOS (ABSOLUTE): 0.1 10*3/uL (ref 0.0–0.4)
Eos: 3 %
Hematocrit: 33.2 % — ABNORMAL LOW (ref 34.0–46.6)
Hemoglobin: 11.1 g/dL (ref 11.1–15.9)
Immature Grans (Abs): 0 10*3/uL (ref 0.0–0.1)
Immature Granulocytes: 0 %
Lymphocytes Absolute: 1.8 10*3/uL (ref 0.7–3.1)
Lymphs: 33 %
MCH: 27.1 pg (ref 26.6–33.0)
MCHC: 33.4 g/dL (ref 31.5–35.7)
MCV: 81 fL (ref 79–97)
Monocytes Absolute: 0.3 10*3/uL (ref 0.1–0.9)
Monocytes: 6 %
Neutrophils Absolute: 3.1 10*3/uL (ref 1.4–7.0)
Neutrophils: 57 %
Platelets: 257 10*3/uL (ref 150–450)
RBC: 4.1 x10E6/uL (ref 3.77–5.28)
RDW: 12.4 % (ref 11.7–15.4)
WBC: 5.3 10*3/uL (ref 3.4–10.8)

## 2019-06-25 LAB — COMPREHENSIVE METABOLIC PANEL
ALT: 11 IU/L (ref 0–32)
AST: 13 IU/L (ref 0–40)
Albumin/Globulin Ratio: 1.6 (ref 1.2–2.2)
Albumin: 3.9 g/dL (ref 3.8–4.9)
Alkaline Phosphatase: 73 IU/L (ref 48–121)
BUN/Creatinine Ratio: 16 (ref 9–23)
BUN: 27 mg/dL — ABNORMAL HIGH (ref 6–24)
Bilirubin Total: 0.4 mg/dL (ref 0.0–1.2)
CO2: 26 mmol/L (ref 20–29)
Calcium: 9.6 mg/dL (ref 8.7–10.2)
Chloride: 101 mmol/L (ref 96–106)
Creatinine, Ser: 1.65 mg/dL — ABNORMAL HIGH (ref 0.57–1.00)
GFR calc Af Amer: 39 mL/min/{1.73_m2} — ABNORMAL LOW (ref >59)
GFR calc non Af Amer: 34 mL/min/{1.73_m2} — ABNORMAL LOW (ref >59)
Globulin, Total: 2.4 g/dL (ref 1.5–4.5)
Glucose: 233 mg/dL — ABNORMAL HIGH (ref 65–99)
Potassium: 4.2 mmol/L (ref 3.5–5.2)
Sodium: 139 mmol/L (ref 134–144)
Total Protein: 6.3 g/dL (ref 6.0–8.5)

## 2019-06-25 LAB — H. PYLORI BREATH TEST: H pylori Breath Test: POSITIVE — AB

## 2019-06-25 LAB — RHEUMATOID ARTHRITIS PROFILE: Cyclic Citrullin Peptide Ab: 3 units (ref 0–19)

## 2019-06-25 LAB — TSH: TSH: 0.78 u[IU]/mL (ref 0.450–4.500)

## 2019-06-25 LAB — VITAMIN D 25 HYDROXY (VIT D DEFICIENCY, FRACTURES): Vit D, 25-Hydroxy: 63.1 ng/mL (ref 30.0–100.0)

## 2019-06-25 LAB — URINE CULTURE

## 2019-06-29 ENCOUNTER — Encounter: Payer: Self-pay | Admitting: Family Medicine

## 2019-06-29 ENCOUNTER — Other Ambulatory Visit: Payer: Self-pay | Admitting: Family Medicine

## 2019-06-29 DIAGNOSIS — A048 Other specified bacterial intestinal infections: Secondary | ICD-10-CM

## 2019-06-29 MED ORDER — OMEPRAZOLE 40 MG PO CPDR: 40.0000 mg | DELAYED_RELEASE_CAPSULE | Freq: Every day | ORAL | 0 refills | Status: DC

## 2019-06-29 MED ORDER — METRONIDAZOLE 500 MG PO TABS: 500.0000 mg | ORAL_TABLET | Freq: Two times a day (BID) | ORAL | 0 refills | Status: AC

## 2019-06-29 MED ORDER — AMOXICILLIN 500 MG PO TABS: ORAL_TABLET | ORAL | 0 refills | Status: DC

## 2019-06-29 MED FILL — OMEPRAZOLE DR 40 MG CAPSULE: 40 | 14 days supply | Qty: 14 | Fill #0

## 2019-06-29 MED FILL — metroNIDAZOLE 500 MG TABS: 500 | 14 days supply | Qty: 28 | Fill #0

## 2019-06-29 MED FILL — AMOXICILLIN 500 MG CAPSULE: 500 | 14 days supply | Qty: 56 | Fill #0

## 2019-06-29 NOTE — Progress Notes (Signed)
Left voicemail for callback.

## 2019-07-07 ENCOUNTER — Telehealth: Payer: Self-pay | Admitting: Family Medicine

## 2019-07-07 NOTE — Progress Notes (Signed)
Left voicemail for callback.

## 2019-07-07 NOTE — Telephone Encounter (Signed)
Multiple attempts to contact patient to review recent lab results. Message left on voicemail today for her to return call.

## 2019-07-17 MED FILL — HYDROCHLOROTHIAZIDE 25 MG T: 25 | 30 days supply | Qty: 30 | Fill #2

## 2019-07-17 MED FILL — ?ATORVASTATIN 10 MG TABLET: 10 | 30 days supply | Qty: 30 | Fill #2

## 2019-07-17 MED FILL — LISINOPRIL 10 MG TABS: 10 | 30 days supply | Qty: 30 | Fill #2

## 2019-08-13 MED FILL — ATORVASTATIN 10 MG TABLET: 10 | 30 days supply | Qty: 30 | Fill #3

## 2019-08-13 MED FILL — metFORMIN HCL 1000 MG TABS: 1000 | 30 days supply | Qty: 60 | Fill #0

## 2019-08-13 MED FILL — glipiZIDE 10 MG TABS: 10 | 30 days supply | Qty: 60 | Fill #3

## 2019-08-13 MED FILL — HYDROCHLOROTHIAZIDE 25 MG T: 25 | 30 days supply | Qty: 30 | Fill #3

## 2019-08-13 MED FILL — LISINOPRIL 10 MG TABS: 10 | 30 days supply | Qty: 30 | Fill #3

## 2019-09-10 ENCOUNTER — Other Ambulatory Visit: Payer: Self-pay | Admitting: Family Medicine

## 2019-09-10 DIAGNOSIS — E785 Hyperlipidemia, unspecified: Secondary | ICD-10-CM

## 2019-09-21 ENCOUNTER — Telehealth: Payer: Self-pay | Admitting: Family Medicine

## 2019-09-21 ENCOUNTER — Other Ambulatory Visit: Payer: Self-pay | Admitting: Family Medicine

## 2019-09-21 DIAGNOSIS — Z794 Long term (current) use of insulin: Secondary | ICD-10-CM

## 2019-09-21 DIAGNOSIS — E1169 Type 2 diabetes mellitus with other specified complication: Secondary | ICD-10-CM

## 2019-09-21 DIAGNOSIS — E785 Hyperlipidemia, unspecified: Secondary | ICD-10-CM

## 2019-09-21 MED ORDER — GLIPIZIDE 10 MG PO TABS: 10.0000 mg | ORAL_TABLET | Freq: Two times a day (BID) | ORAL | 11 refills | Status: DC

## 2019-09-21 MED ORDER — HYDROCHLOROTHIAZIDE 25 MG PO TABS
25.0000 mg | ORAL_TABLET | Freq: Every day | ORAL | 11 refills | Status: DC
Start: 2019-09-21 — End: 2019-09-21

## 2019-09-21 MED ORDER — AMLODIPINE BESYLATE 10 MG PO TABS
10.0000 mg | ORAL_TABLET | Freq: Every day | ORAL | 11 refills | Status: DC
Start: 2019-09-21 — End: 2019-09-21

## 2019-09-21 MED ORDER — ATORVASTATIN CALCIUM 10 MG PO TABS: 10.0000 mg | ORAL_TABLET | Freq: Every day | ORAL | 11 refills | Status: DC

## 2019-09-21 MED FILL — AMLODIPINE BESYLATE 10 MG T: 10 | 30 days supply | Qty: 30 | Fill #0

## 2019-09-21 MED FILL — HYDROCHLOROTHIAZIDE 25 MG T: 25 | 30 days supply | Qty: 30 | Fill #0

## 2019-09-21 MED FILL — glipiZIDE 10 MG TABS: 10 | 30 days supply | Qty: 60 | Fill #0

## 2019-09-21 MED FILL — ATORVASTATIN 10 MG TABLET: 10 | 30 days supply | Qty: 30 | Fill #0

## 2019-09-22 ENCOUNTER — Telehealth: Payer: Self-pay | Admitting: Family Medicine

## 2019-09-22 NOTE — Telephone Encounter (Signed)
Pt was called Vm was left to the office to schedule a appointment

## 2019-09-22 NOTE — Telephone Encounter (Signed)
Called Vm was left for Pt to call and schedule appointment

## 2019-09-25 ENCOUNTER — Other Ambulatory Visit: Payer: Self-pay | Admitting: Family Medicine

## 2019-10-02 MED FILL — AMLODIPINE BESYLATE 10 MG T: 10 | 30 days supply | Qty: 30 | Fill #0

## 2019-10-02 MED FILL — glipiZIDE 10 MG TABS: 10 | 30 days supply | Qty: 60 | Fill #0

## 2019-10-02 MED FILL — HYDROCHLOROTHIAZIDE 25 MG T: 25 | 30 days supply | Qty: 30 | Fill #0

## 2019-10-02 MED FILL — ATORVASTATIN 10 MG TABLET: 10 | 30 days supply | Qty: 30 | Fill #0

## 2019-10-28 MED FILL — AMLODIPINE BESYLATE 10 MG T: 10 | 30 days supply | Qty: 30 | Fill #1

## 2019-10-28 MED FILL — ATORVASTATIN 10 MG TABLET: 10 | 30 days supply | Qty: 30 | Fill #1

## 2019-10-28 MED FILL — HYDROCHLOROTHIAZIDE 25 MG T: 25 | 30 days supply | Qty: 30 | Fill #1

## 2019-11-06 MED FILL — ATORVASTATIN 10 MG TABLET: 10 | 30 days supply | Qty: 30 | Fill #1

## 2019-11-06 MED FILL — glipiZIDE 10 MG TABS: 10 | 30 days supply | Qty: 60 | Fill #1

## 2019-11-06 MED FILL — METFORMIN HCL 1000 MG TABS: 1000 | 30 days supply | Qty: 60 | Fill #1

## 2019-11-06 MED FILL — AMLODIPINE BESYLATE 10 MG T: 10 | 30 days supply | Qty: 30 | Fill #1

## 2019-11-13 MED FILL — HYDROCHLOROTHIAZIDE 25 MG T: 25 | 30 days supply | Qty: 30 | Fill #1

## 2019-11-13 MED FILL — METFORMIN HCL 1000 MG TABS: 1000 | 30 days supply | Qty: 60 | Fill #1

## 2020-01-14 MED FILL — HYDROCHLOROTHIAZIDE 25 MG T: 25 | 30 days supply | Qty: 30 | Fill #2

## 2020-01-14 MED FILL — METFORMIN HCL 1000 MG TABS: 1000 | 30 days supply | Qty: 60 | Fill #2

## 2020-01-14 MED FILL — ATORVASTATIN 10 MG TABLET: 10 | 30 days supply | Qty: 30 | Fill #2

## 2020-01-14 MED FILL — AMLODIPINE BESYLATE 10 MG T: 10 | 30 days supply | Qty: 30 | Fill #2

## 2020-02-11 MED FILL — HYDROCHLOROTHIAZIDE 25 MG T: 25 | 30 days supply | Qty: 30 | Fill #3

## 2020-02-11 MED FILL — METFORMIN HCL 1000 MG TABS: 1000 | 30 days supply | Qty: 60 | Fill #3

## 2020-02-11 MED FILL — ATORVASTATIN 10 MG TABLET: 10 | 30 days supply | Qty: 30 | Fill #3

## 2020-03-11 MED FILL — HYDROCHLOROTHIAZIDE 25 MG T: 25 | 30 days supply | Qty: 30 | Fill #3

## 2020-03-11 MED FILL — ?ATORVASTATIN 10 MG TABLET: 10 | 30 days supply | Qty: 30 | Fill #3

## 2020-03-11 MED FILL — ?METFORMIN HCL 1000 MG TAB: 1000 | 30 days supply | Qty: 60 | Fill #3

## 2020-03-11 MED FILL — AMLODIPINE BESYLATE 10 MG T: 10 | 30 days supply | Qty: 30 | Fill #3

## 2020-04-17 MED FILL — Metformin HCl Tab 1000 MG: ORAL | 30 days supply | Qty: 60 | Fill #0 | Status: AC

## 2020-04-17 MED FILL — Atorvastatin Calcium Tab 10 MG (Base Equivalent): ORAL | 30 days supply | Qty: 30 | Fill #0 | Status: AC

## 2020-04-17 MED FILL — Amlodipine Besylate Tab 10 MG (Base Equivalent): ORAL | 30 days supply | Qty: 30 | Fill #0 | Status: AC

## 2020-04-18 ENCOUNTER — Other Ambulatory Visit: Payer: Self-pay

## 2020-04-19 ENCOUNTER — Other Ambulatory Visit: Payer: Self-pay

## 2020-09-01 ENCOUNTER — Emergency Department (HOSPITAL_COMMUNITY): Payer: 59

## 2020-09-01 ENCOUNTER — Inpatient Hospital Stay (HOSPITAL_COMMUNITY)
Admission: EM | Admit: 2020-09-01 | Discharge: 2020-09-07 | DRG: 638 | Disposition: A | Discharge status: Home / Self Care | Payer: 59 | Attending: Internal Medicine | Admitting: Internal Medicine

## 2020-09-01 ENCOUNTER — Encounter (HOSPITAL_COMMUNITY): Payer: Self-pay

## 2020-09-01 DIAGNOSIS — R739 Hyperglycemia, unspecified: Secondary | ICD-10-CM

## 2020-09-01 DIAGNOSIS — Z7984 Long term (current) use of oral hypoglycemic drugs: Secondary | ICD-10-CM

## 2020-09-01 DIAGNOSIS — Z79899 Other long term (current) drug therapy: Secondary | ICD-10-CM

## 2020-09-01 DIAGNOSIS — E1100 Type 2 diabetes mellitus with hyperosmolarity without nonketotic hyperglycemic-hyperosmolar coma (NKHHC): Principal | ICD-10-CM | POA: Diagnosis present

## 2020-09-01 DIAGNOSIS — N179 Acute kidney failure, unspecified: Secondary | ICD-10-CM

## 2020-09-01 DIAGNOSIS — E872 Acidosis, unspecified: Secondary | ICD-10-CM

## 2020-09-01 DIAGNOSIS — R600 Localized edema: Secondary | ICD-10-CM | POA: Diagnosis not present

## 2020-09-01 DIAGNOSIS — I129 Hypertensive chronic kidney disease with stage 1 through stage 4 chronic kidney disease, or unspecified chronic kidney disease: Secondary | ICD-10-CM | POA: Diagnosis present

## 2020-09-01 DIAGNOSIS — G40909 Epilepsy, unspecified, not intractable, without status epilepticus: Secondary | ICD-10-CM

## 2020-09-01 DIAGNOSIS — Z20822 Contact with and (suspected) exposure to covid-19: Secondary | ICD-10-CM | POA: Diagnosis present

## 2020-09-01 DIAGNOSIS — R4182 Altered mental status, unspecified: Secondary | ICD-10-CM | POA: Diagnosis not present

## 2020-09-01 DIAGNOSIS — R29818 Other symptoms and signs involving the nervous system: Secondary | ICD-10-CM

## 2020-09-01 DIAGNOSIS — G9341 Metabolic encephalopathy: Secondary | ICD-10-CM | POA: Diagnosis present

## 2020-09-01 DIAGNOSIS — R569 Unspecified convulsions: Secondary | ICD-10-CM

## 2020-09-01 DIAGNOSIS — E1169 Type 2 diabetes mellitus with other specified complication: Secondary | ICD-10-CM | POA: Diagnosis not present

## 2020-09-01 DIAGNOSIS — E785 Hyperlipidemia, unspecified: Secondary | ICD-10-CM | POA: Diagnosis present

## 2020-09-01 DIAGNOSIS — E871 Hypo-osmolality and hyponatremia: Secondary | ICD-10-CM

## 2020-09-01 DIAGNOSIS — Z8673 Personal history of transient ischemic attack (TIA), and cerebral infarction without residual deficits: Secondary | ICD-10-CM

## 2020-09-01 DIAGNOSIS — N1832 Chronic kidney disease, stage 3b: Secondary | ICD-10-CM | POA: Diagnosis present

## 2020-09-01 DIAGNOSIS — E1165 Type 2 diabetes mellitus with hyperglycemia: Secondary | ICD-10-CM

## 2020-09-01 DIAGNOSIS — R4701 Aphasia: Secondary | ICD-10-CM | POA: Diagnosis present

## 2020-09-01 DIAGNOSIS — Z833 Family history of diabetes mellitus: Secondary | ICD-10-CM

## 2020-09-01 DIAGNOSIS — N185 Chronic kidney disease, stage 5: Secondary | ICD-10-CM

## 2020-09-01 DIAGNOSIS — E876 Hypokalemia: Secondary | ICD-10-CM | POA: Diagnosis not present

## 2020-09-01 DIAGNOSIS — N184 Chronic kidney disease, stage 4 (severe): Secondary | ICD-10-CM

## 2020-09-01 DIAGNOSIS — E119 Type 2 diabetes mellitus without complications: Secondary | ICD-10-CM

## 2020-09-01 DIAGNOSIS — Z8249 Family history of ischemic heart disease and other diseases of the circulatory system: Secondary | ICD-10-CM

## 2020-09-01 DIAGNOSIS — E1122 Type 2 diabetes mellitus with diabetic chronic kidney disease: Secondary | ICD-10-CM | POA: Diagnosis present

## 2020-09-01 DIAGNOSIS — Z794 Long term (current) use of insulin: Secondary | ICD-10-CM

## 2020-09-01 HISTORY — DX: Other symptoms and signs involving the nervous system: R29.818

## 2020-09-01 HISTORY — DX: Type 2 diabetes mellitus with hyperosmolarity without nonketotic hyperglycemic-hyperosmolar coma (NKHHC): E11.00

## 2020-09-01 LAB — URINALYSIS, ROUTINE W REFLEX MICROSCOPIC
Bacteria, UA: NONE SEEN
Bilirubin Urine: NEGATIVE
Glucose, UA: >=500 mg/dL — AB
Ketones, ur: NEGATIVE mg/dL
Leukocytes,Ua: NEGATIVE
Nitrite: NEGATIVE
Protein, ur: 100 mg/dL — AB
Specific Gravity, Urine: 1.017 (ref 1.005–1.030)
pH: 7 (ref 5.0–8.0)

## 2020-09-01 LAB — RESP PANEL BY RT-PCR (FLU A&B, COVID) ARPGX2
Influenza A by PCR: NEGATIVE
Influenza B by PCR: NEGATIVE
SARS Coronavirus 2 by RT PCR: NEGATIVE

## 2020-09-01 LAB — I-STAT VENOUS BLOOD GAS, ED
Acid-Base Excess: 2 mmol/L (ref 0.0–2.0)
Bicarbonate: 26 mmol/L (ref 20.0–28.0)
Calcium, Ion: 0.91 mmol/L — ABNORMAL LOW (ref 1.15–1.40)
HCT: 40 % (ref 36.0–46.0)
Hemoglobin: 13.6 g/dL (ref 12.0–15.0)
O2 Saturation: 70 %
Potassium: 3.9 mmol/L (ref 3.5–5.1)
Sodium: 124 mmol/L — ABNORMAL LOW (ref 135–145)
TCO2: 27 mmol/L (ref 22–32)
pCO2, Ven: 37.9 mmHg — ABNORMAL LOW (ref 44.0–60.0)
pH, Ven: 7.444 — ABNORMAL HIGH (ref 7.250–7.430)
pO2, Ven: 35 mmHg (ref 32.0–45.0)

## 2020-09-01 LAB — CBC WITH DIFFERENTIAL/PLATELET
Abs Immature Granulocytes: 0.07 10*3/uL (ref 0.00–0.07)
Basophils Absolute: 0 10*3/uL (ref 0.0–0.1)
Basophils Relative: 0 %
Eosinophils Absolute: 0 10*3/uL (ref 0.0–0.5)
Eosinophils Relative: 0 %
HCT: 38.3 % (ref 36.0–46.0)
Hemoglobin: 12.8 g/dL (ref 12.0–15.0)
Immature Granulocytes: 1 %
Lymphocytes Relative: 11 %
Lymphs Abs: 1.1 10*3/uL (ref 0.7–4.0)
MCH: 26.9 pg (ref 26.0–34.0)
MCHC: 33.4 g/dL (ref 30.0–36.0)
MCV: 80.6 fL (ref 80.0–100.0)
Monocytes Absolute: 0.5 10*3/uL (ref 0.1–1.0)
Monocytes Relative: 5 %
Neutro Abs: 8.1 10*3/uL — ABNORMAL HIGH (ref 1.7–7.7)
Neutrophils Relative %: 83 %
Platelets: 296 10*3/uL (ref 150–400)
RBC: 4.75 MIL/uL (ref 3.87–5.11)
RDW: 12.8 % (ref 11.5–15.5)
WBC: 9.9 10*3/uL (ref 4.0–10.5)
nRBC: 0 % (ref 0.0–0.2)

## 2020-09-01 LAB — I-STAT BETA HCG BLOOD, ED (MC, WL, AP ONLY): I-stat hCG, quantitative: 10.6 m[IU]/mL — ABNORMAL HIGH (ref <5)

## 2020-09-01 LAB — BASIC METABOLIC PANEL
Anion gap: 9 (ref 5–15)
BUN: 20 mg/dL (ref 6–20)
CO2: 27 mmol/L (ref 22–32)
Calcium: 8.8 mg/dL — ABNORMAL LOW (ref 8.9–10.3)
Chloride: 93 mmol/L — ABNORMAL LOW (ref 98–111)
Creatinine, Ser: 2.52 mg/dL — ABNORMAL HIGH (ref 0.44–1.00)
GFR, Estimated: 22 mL/min — ABNORMAL LOW (ref >60)
Glucose, Bld: 821 mg/dL (ref 70–99)
Potassium: 3.6 mmol/L (ref 3.5–5.1)
Sodium: 129 mmol/L — ABNORMAL LOW (ref 135–145)

## 2020-09-01 LAB — BRAIN NATRIURETIC PEPTIDE: B Natriuretic Peptide: 49.6 pg/mL (ref 0.0–100.0)

## 2020-09-01 LAB — CBG MONITORING, ED
Glucose-Capillary: >600 mg/dL (ref 70–99)
Glucose-Capillary: 384 mg/dL — ABNORMAL HIGH (ref 70–99)
Glucose-Capillary: 526 mg/dL (ref 70–99)
Glucose-Capillary: 233 mg/dL — ABNORMAL HIGH (ref 70–99)

## 2020-09-01 LAB — OSMOLALITY: Osmolality: 322 mOsm/kg (ref 275–295)

## 2020-09-01 LAB — GLUCOSE, CAPILLARY: Glucose-Capillary: 223 mg/dL — ABNORMAL HIGH (ref 70–99)

## 2020-09-01 LAB — LACTIC ACID, PLASMA: Lactic Acid, Venous: 2.5 mmol/L (ref 0.5–1.9)

## 2020-09-01 IMAGING — DX DG CHEST 2V
2 series · 2 of 2 positions shown · non-contrast
Comparison: None.

CLINICAL DATA: Altered mental status

EXAM:
CHEST - 2 VIEW

[chest lat]
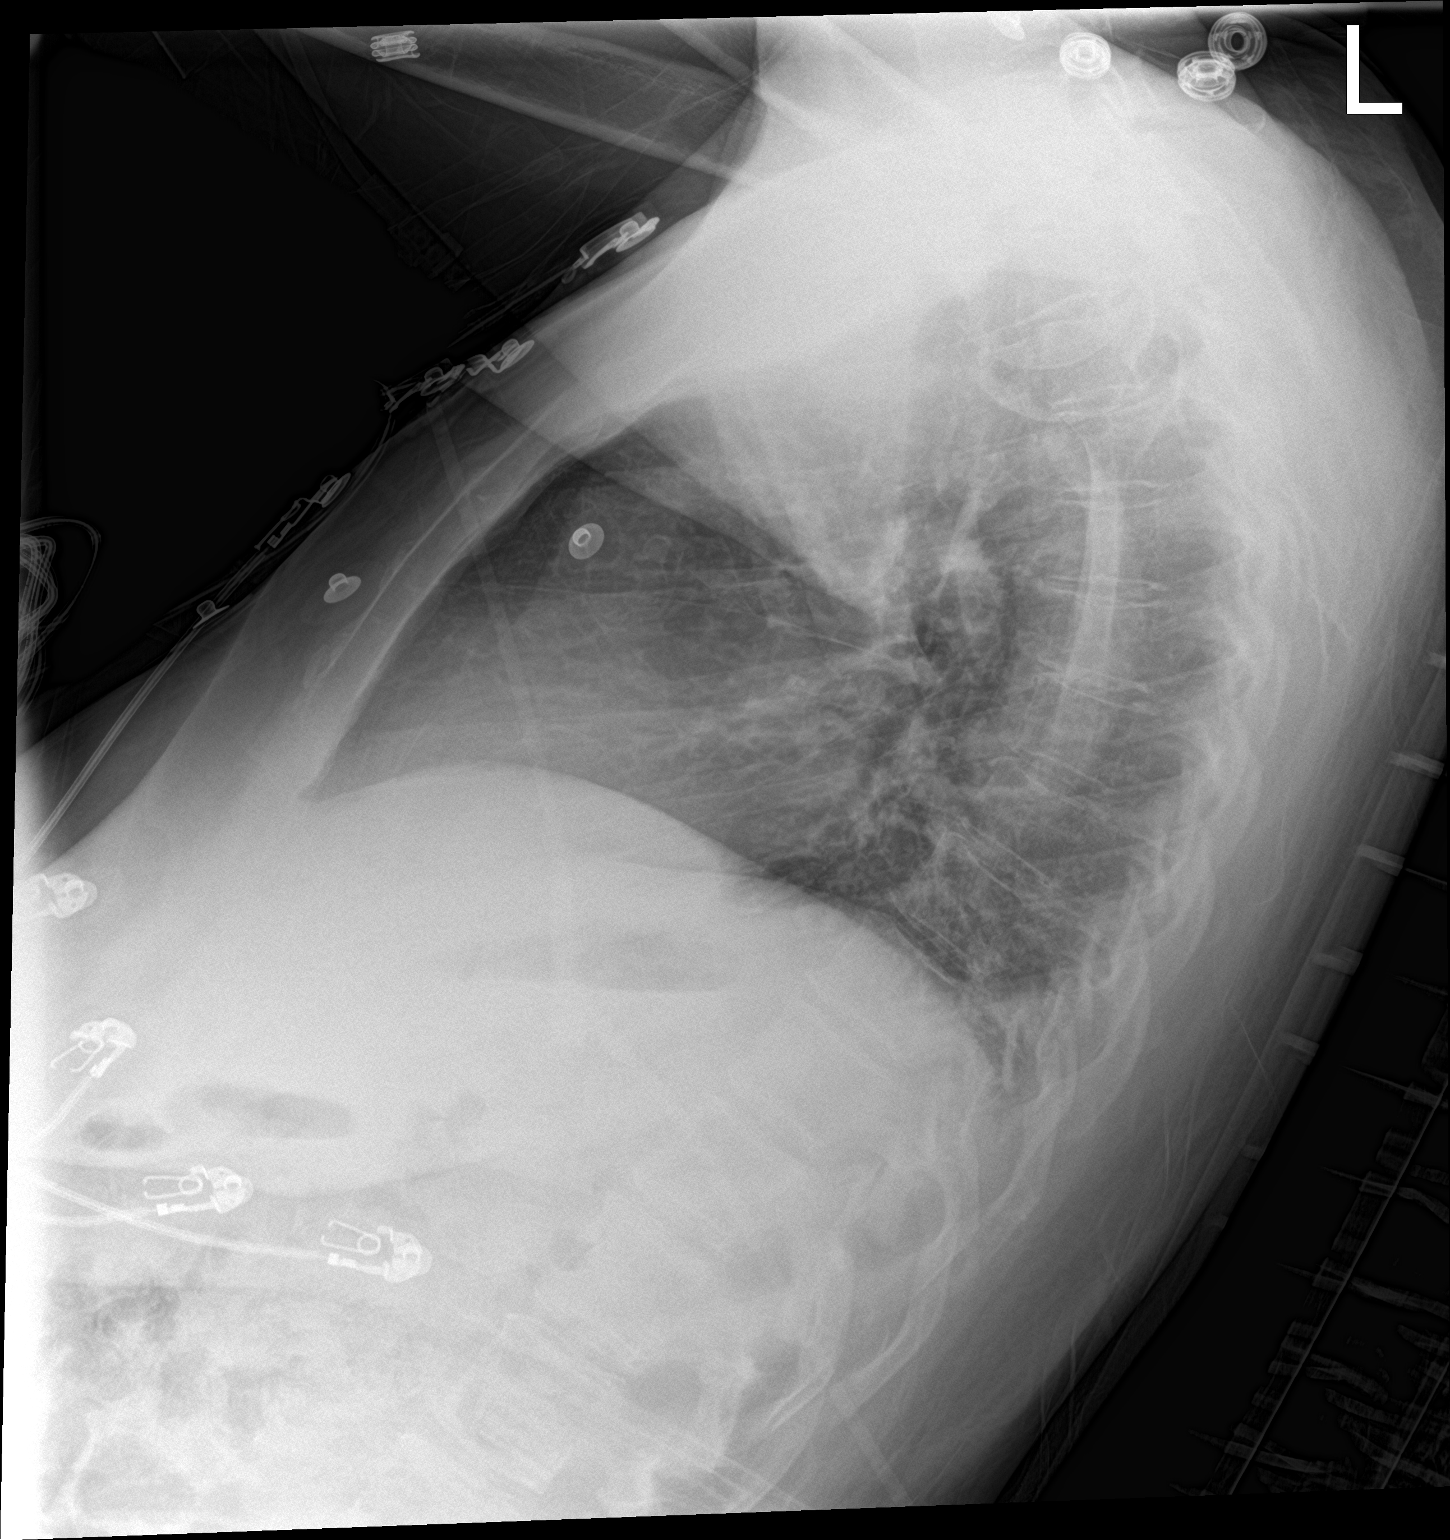

[chest ap]
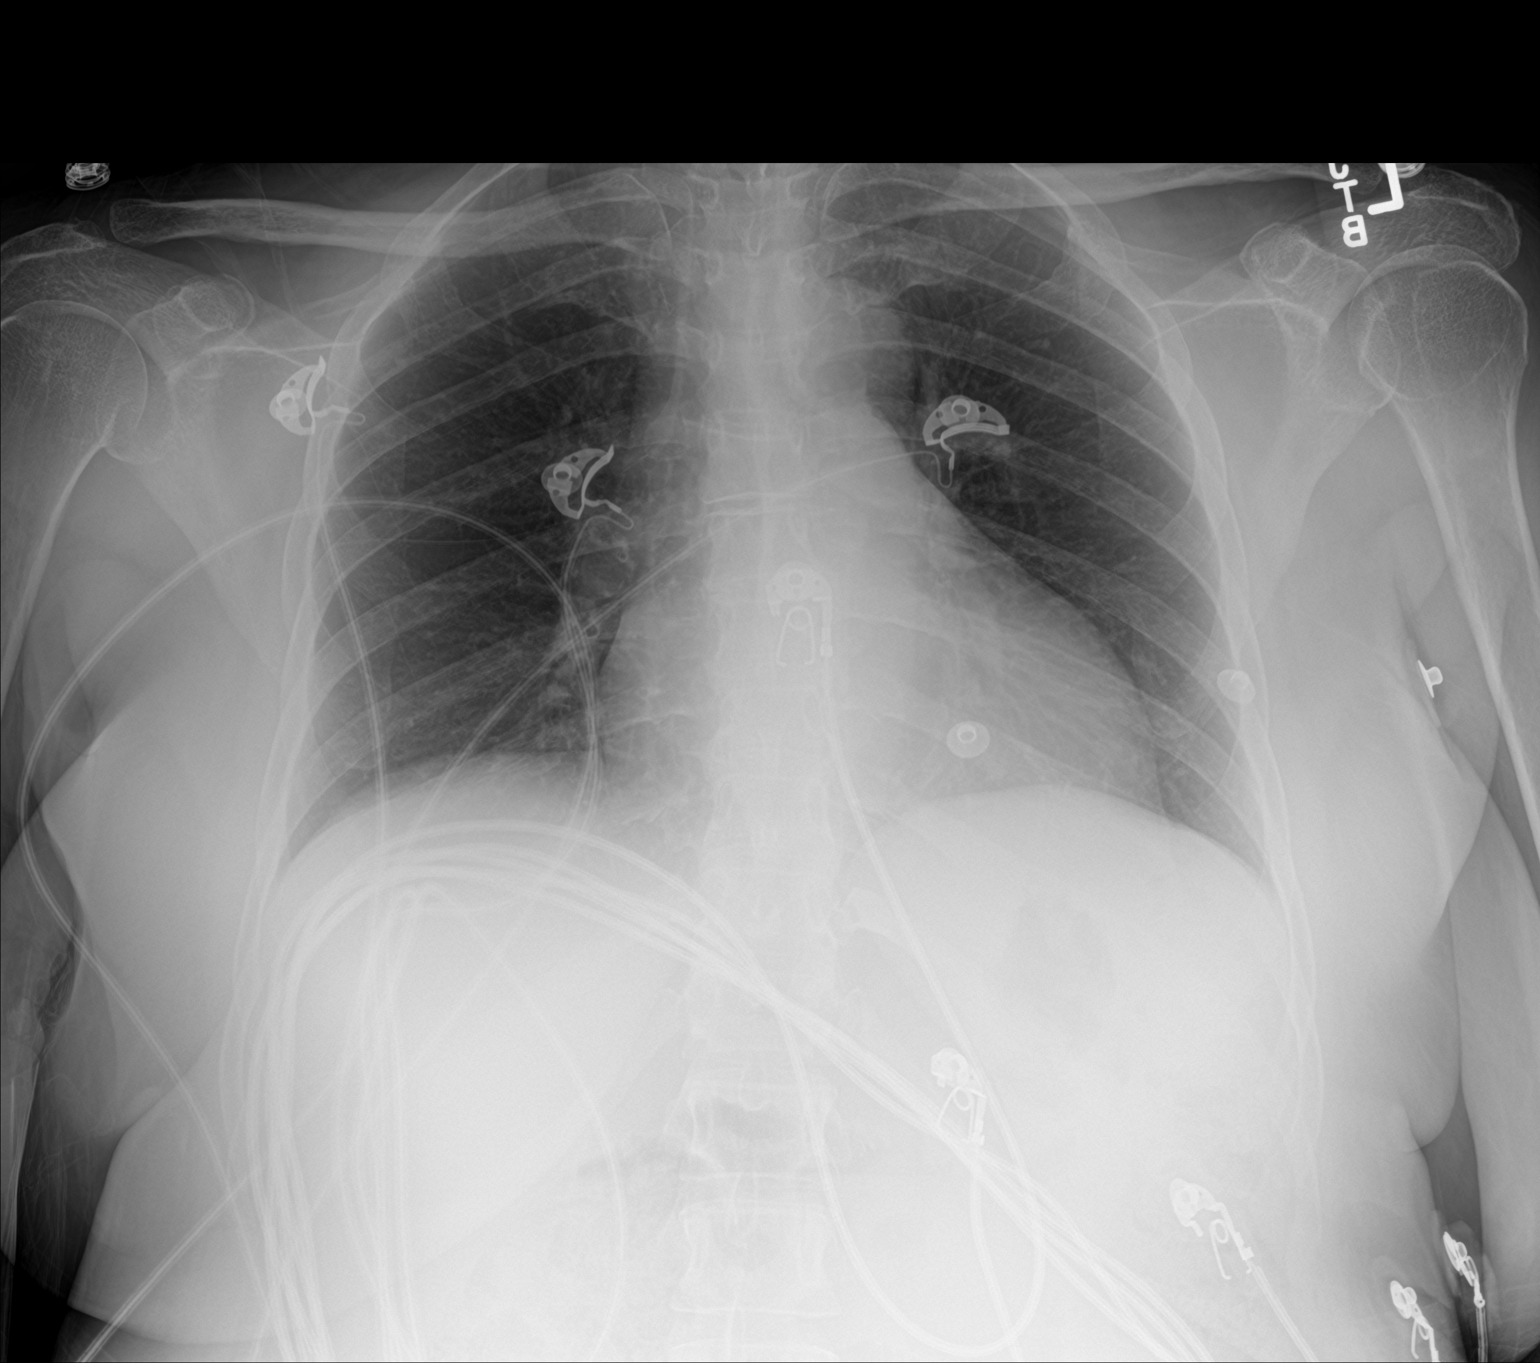

[2 of 2 positions shown; findings below may reference images not displayed]

FINDINGS: The cardiomediastinal silhouette is normal.

There is no focal consolidation or pulmonary edema. There is no
pleural effusion or pneumothorax.

There is no acute osseous abnormality.
IMPRESSION: No radiographic evidence of acute cardiopulmonary process.

## 2020-09-01 IMAGING — CT CT HEAD W/O CM
4 series · 16 of 47 positions shown, 18 images · non-contrast
Comparison: None.

CLINICAL DATA: Mental status change, unknown cause

EXAM:
CT HEAD WITHOUT CONTRAST
TECHNIQUE: Contiguous axial images were obtained from the base of the skull
through the vertex without intravenous contrast.

[Series 3: head wo · axial · 0.41mm/px · z∈[-176,-56]mm · 7 of 32 slices shown, 9 images]
[im 4/32  brain]
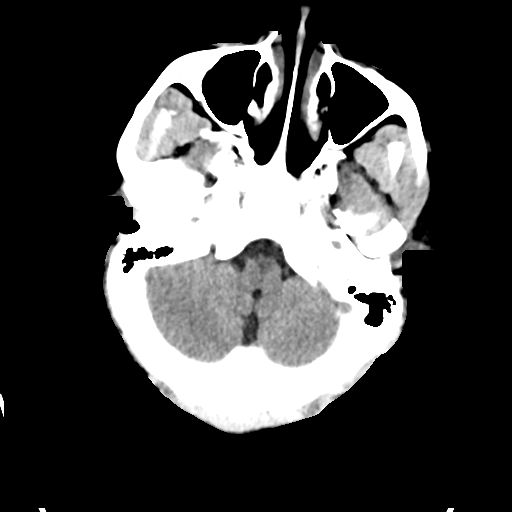
[im 4/32  bone]
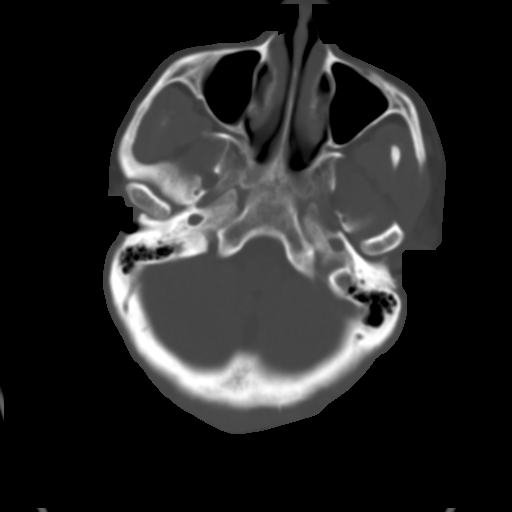
[im 8/32  brain]
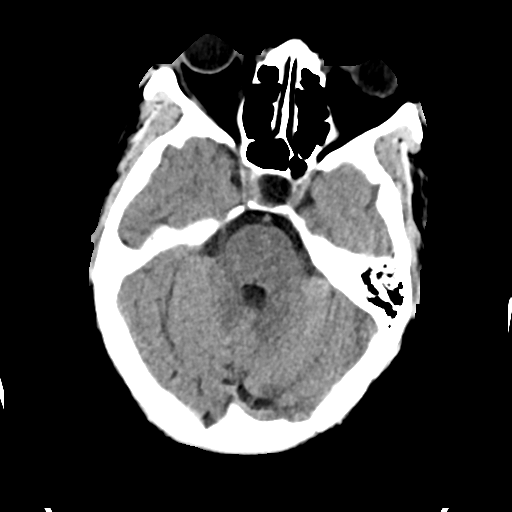
[im 12/32  brain]
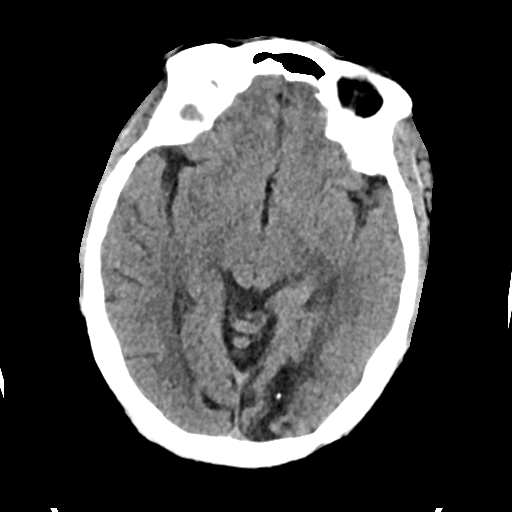
[im 16/32  brain]
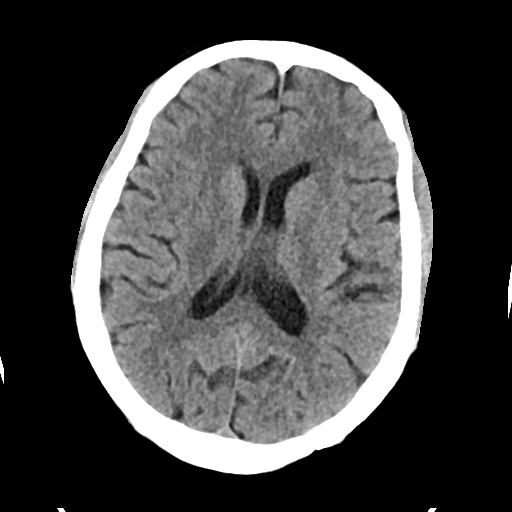
[im 20/32  brain]
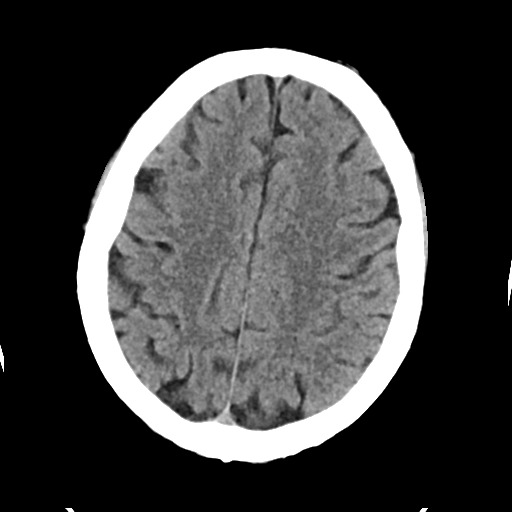
[im 20/32  bone]
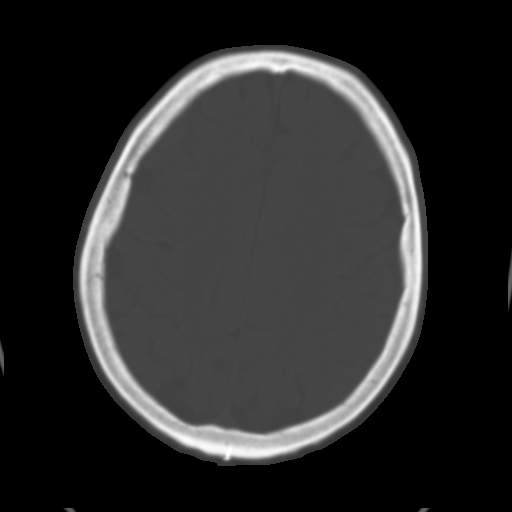
[im 24/32  brain]
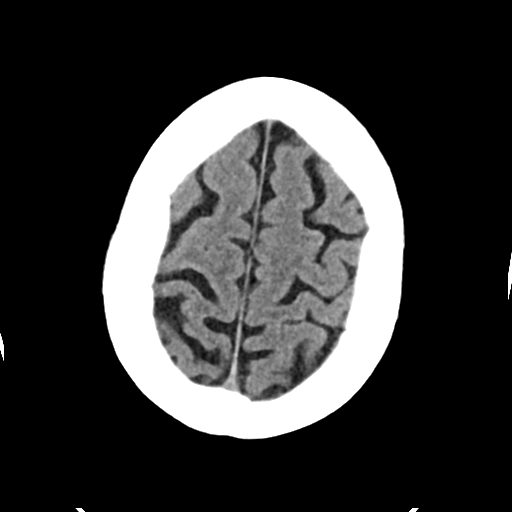
[im 28/32  brain]
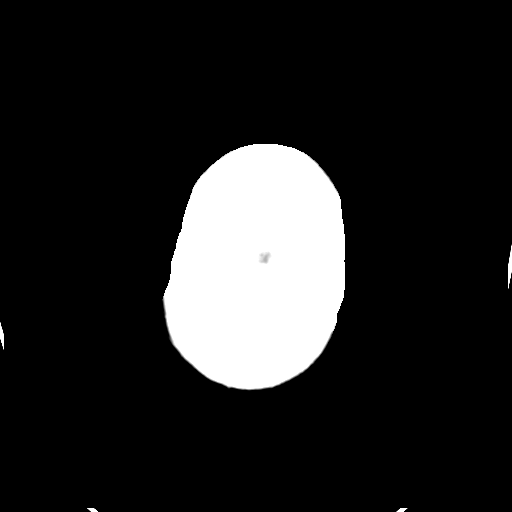

[Series 4: head bone · axial · 0.41mm/px · z∈[-178,-146]mm · 3 of 80 slices shown]
[im 8/80  bone]
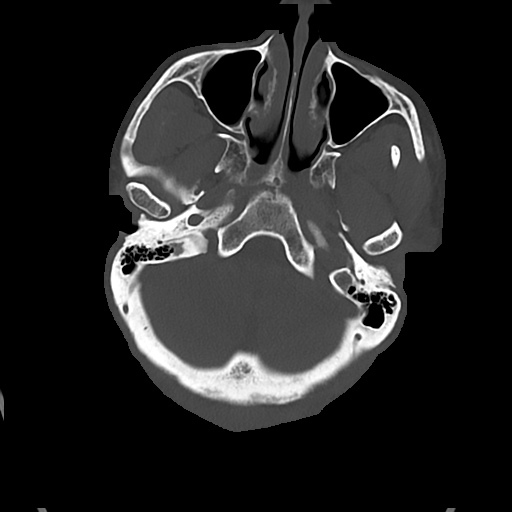
[im 16/80  bone]
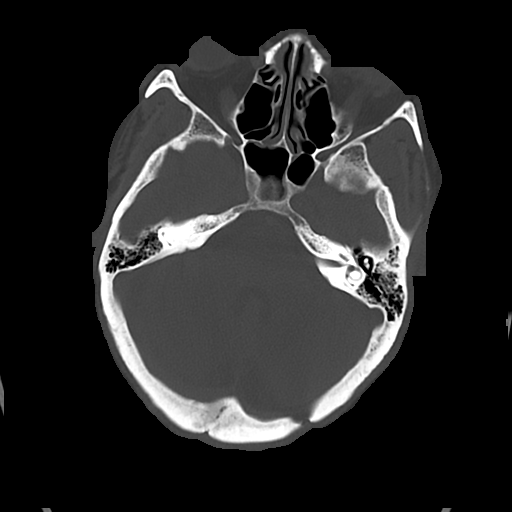
[im 24/80  bone]
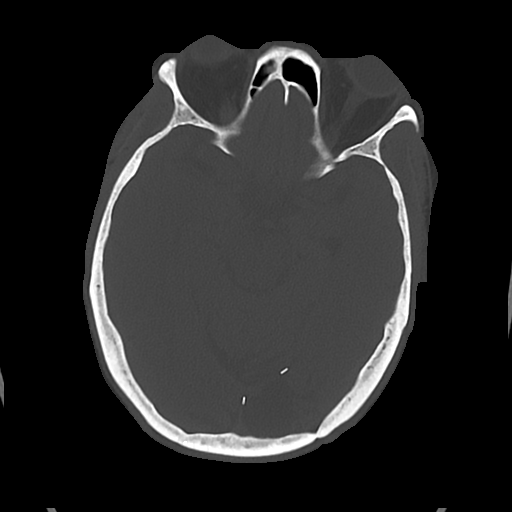

[Series 5: cor soft · coronal · 0.30mm/px · 3 of 69 slices shown]
[im 23/69  brain]
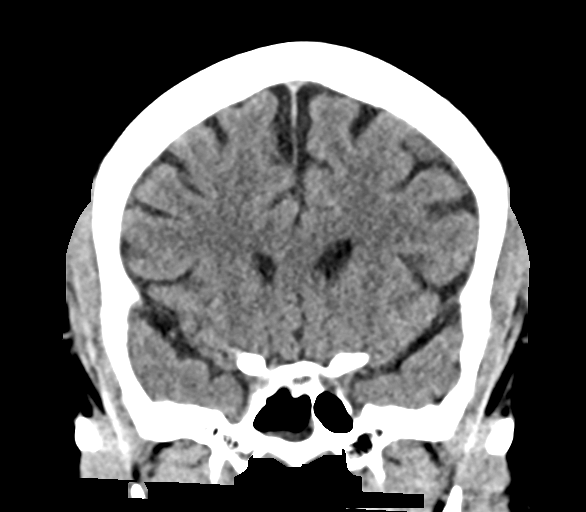
[im 31/69  brain]
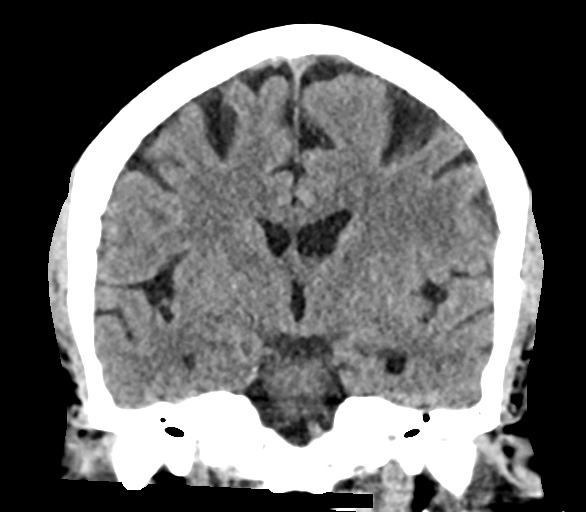
[im 38/69  brain]
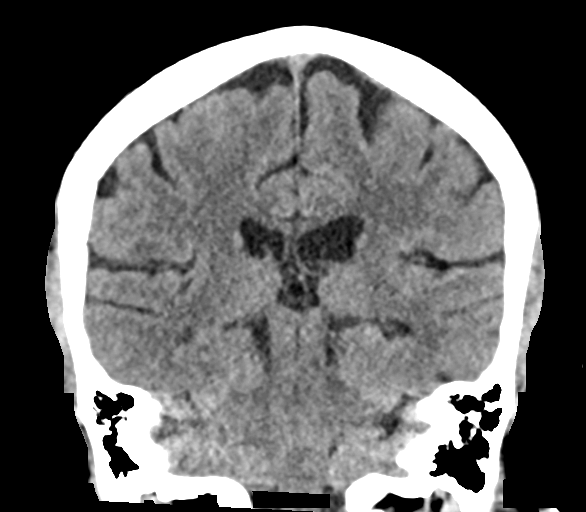

[Series 6: sag soft · sagittal · 0.30mm/px · 3 of 58 slices shown]
[im 20/58  brain]
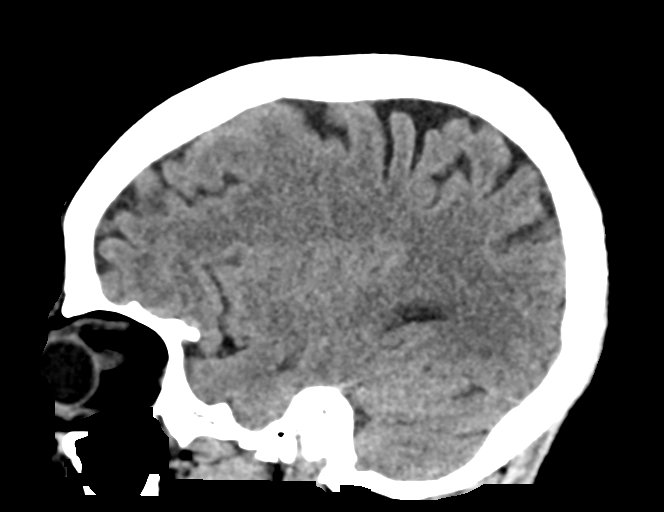
[im 29/58  brain]
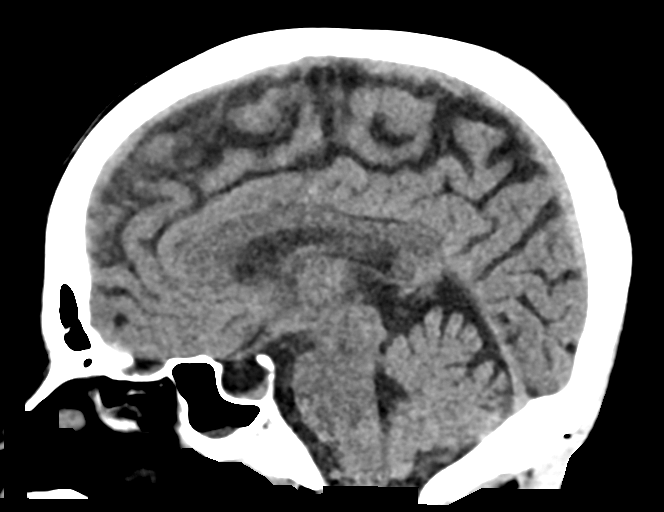
[im 39/58  brain]
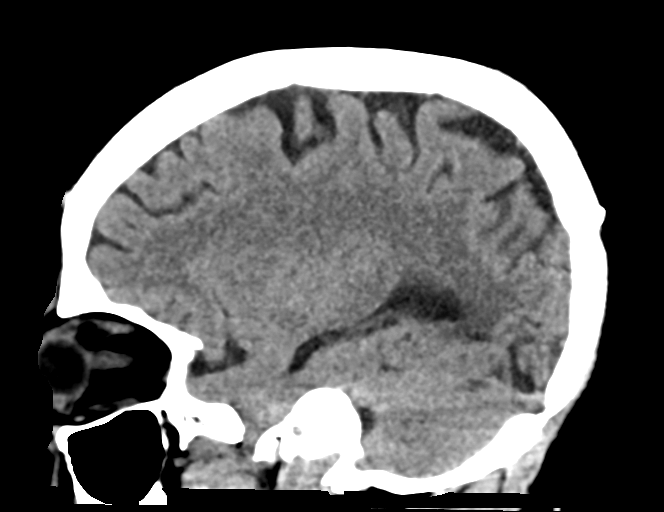

[16 of 47 positions shown; findings below may reference images not displayed]

FINDINGS: Brain: Encephalomalacia in the left occipital lobe, presumably old
infarct. No acute intracranial abnormality. Specifically, no
hemorrhage, hydrocephalus, mass lesion, acute infarction, or
significant intracranial injury.

Vascular: No hyperdense vessel or unexpected calcification.

Skull: No acute calvarial abnormality.

Sinuses/Orbits: No acute findings

Other: None
IMPRESSION: Old left occipital infarct with encephalomalacia.

No acute intracranial abnormality.

## 2020-09-01 MED ORDER — LACTATED RINGERS IV BOLUS
1000.0000 mL | Freq: Once | INTRAVENOUS | Status: AC
  Administered 2020-09-01: 1000 mL via INTRAVENOUS

## 2020-09-01 MED ORDER — DEXTROSE IN LACTATED RINGERS 5 % IV SOLN
INTRAVENOUS | Status: DC
Start: 2020-09-01 — End: 2020-09-02

## 2020-09-01 MED ORDER — LACTATED RINGERS IV SOLN: INTRAVENOUS | Status: DC

## 2020-09-01 MED ORDER — AMLODIPINE BESYLATE 5 MG PO TABS
10.0000 mg | ORAL_TABLET | Freq: Every day | ORAL | Status: DC
  Administered 2020-09-01: 10 mg via ORAL
  Filled 2020-09-01: qty 2

## 2020-09-01 MED ORDER — HYDROCHLOROTHIAZIDE 25 MG PO TABS
25.0000 mg | ORAL_TABLET | Freq: Once | ORAL | Status: AC
  Administered 2020-09-01: 25 mg via ORAL
  Filled 2020-09-01: qty 1

## 2020-09-01 MED ORDER — DEXTROSE 50 % IV SOLN: 0.0000 mL | INTRAVENOUS | Status: DC | PRN

## 2020-09-01 MED ORDER — POTASSIUM CHLORIDE 10 MEQ/100ML IV SOLN
10.0000 meq | INTRAVENOUS | Status: AC
  Administered 2020-09-01 (×2): 10 meq via INTRAVENOUS
  Filled 2020-09-01 (×2): qty 100

## 2020-09-01 MED ORDER — ATORVASTATIN CALCIUM 10 MG PO TABS
10.0000 mg | ORAL_TABLET | Freq: Every day | ORAL | Status: DC
  Administered 2020-09-02: 10 mg via ORAL
  Filled 2020-09-01: qty 1

## 2020-09-01 MED ORDER — INSULIN REGULAR(HUMAN) IN NACL 100-0.9 UT/100ML-% IV SOLN
INTRAVENOUS | Status: DC
  Administered 2020-09-01: 8 [IU]/h via INTRAVENOUS
  Filled 2020-09-01: qty 100

## 2020-09-01 MED ORDER — ENOXAPARIN SODIUM 30 MG/0.3ML IJ SOSY
30.0000 mg | PREFILLED_SYRINGE | INTRAMUSCULAR | Status: DC
  Administered 2020-09-01 – 2020-09-06 (×6): 30 mg via SUBCUTANEOUS
  Filled 2020-09-01 (×6): qty 0.3

## 2020-09-01 MED ORDER — LABETALOL HCL 5 MG/ML IV SOLN
10.0000 mg | Freq: Once | INTRAVENOUS | Status: AC
  Administered 2020-09-01: 10 mg via INTRAVENOUS
  Filled 2020-09-01: qty 4

## 2020-09-01 MED ORDER — INSULIN ASPART 100 UNIT/ML IJ SOLN
7.0000 [IU] | Freq: Once | INTRAMUSCULAR | Status: AC
  Administered 2020-09-01: 7 [IU] via INTRAVENOUS

## 2020-09-01 MED ORDER — STROKE: EARLY STAGES OF RECOVERY BOOK: Freq: Once | Status: AC

## 2020-09-01 NOTE — ED Provider Notes (Signed)
Patient care assumed from [place] found at shift change.  Please see his note to see complete history.    Patient arrived via EMS for confusion and altered mental status.  Collateral information is from EMS and from patient's Sister Lelan Pons who I spoke with on the cell phone.  Her Sister Stanton Kidney states that she got a call today from a hairstylist who she and the patient share stating that the patient called to cancel her appointment today but did not sound right on the phone.  Patient normally lives alone, another family member went to the house to check on her and she was talking strange.  She was speaking slowly, speaking unintelligibly in both [place] and [place].  They states she seemed confused, and called EMS.  EMS brought the patient to the hospital and found she had a blood glucose greater than 600.  Patient states she does not have a history of diabetes, but per chart review she has been on metformin since 2020.    When I spoke with the patient in the room, she gave me Marie's phone number thinking it was her daughter cell phone number.  Patient history is very limited due to altered mental status, level 5 caveat applies.  She denies any pain anywhere, denies missing any doses of medication.  Physical Exam  BP (!) 205/108 (BP Location: Right Arm)   Pulse (!) 101   Temp 98.4 F (36.9 C) (Oral)   Resp 14   SpO2 94%   Physical Exam Vitals and nursing note reviewed. Exam conducted with a chaperone present.  Constitutional:      Appearance: Normal appearance. She is obese.  HENT:     Head: Normocephalic and atraumatic.  Eyes:     General: No scleral icterus.       Right eye: No discharge.        Left eye: No discharge.     Extraocular Movements: Extraocular movements intact.     Pupils: Pupils are equal, round, and reactive to light.  Cardiovascular:     Rate and Rhythm: Normal rate and regular rhythm.     Pulses: Normal pulses.     Heart sounds: Normal heart sounds. No murmur heard.    No friction rub. No gallop.     Comments: Decreased heart sound secondary to body habitus, but S1-S2 heard without any murmurs rubs or gallops. Pulmonary:     Effort: Pulmonary effort is normal. No respiratory distress.     Breath sounds: Normal breath sounds.  Abdominal:     General: Abdomen is flat. Bowel sounds are normal. There is no distension.     Palpations: Abdomen is soft.     Tenderness: There is no abdominal tenderness.  Skin:    General: Skin is warm and dry.     Coloration: Skin is not jaundiced.  Neurological:     Mental Status: She is alert. She is disoriented and confused.     Coordination: Coordination normal.     Comments: No dysarthria, but patient does have slowed speech.  She is able to answer some questions, but needs to be redirected.  Patient is unable or unwilling to follow commands I was unable to check cranial nerves.  She is oriented to self and year only.   ED Course/Procedures   Clinical Course as of 09/01/20 2043  Thu Sep 01, 2020  1451 Attempted to contact Altha Harm who is patient's daughter.  Phone number on file appears to be disconnected. [WF]  [phone] Contact  number - [phone] marie cell (sister) called to cancel hair appointment but didn't sound right. Hairstylist alerted sister who sent husband to check on her. She wasn't talking normally, slowed speech, confused. Sister called 49 for EMS check. Talked to daughter on group chat Monday.  [HS]    Clinical Course User Index [HS] Sherrill Raring, PA-C [WF] Tedd Sias, [place]    Procedures  MDM  Patient will need admission for HHS.  Spoke with hospitalist Earley Favor and he will admit her to the service.       Sherrill Raring, PA-C 09/01/20 2043    Luna Fuse, MD 09/01/20 2111

## 2020-09-01 NOTE — ED Notes (Signed)
Bedside mini swallow eval, able to drink water; no coughing or choking.

## 2020-09-01 NOTE — ED Notes (Addendum)
Glucose 821, reported to PA

## 2020-09-01 NOTE — ED Notes (Signed)
Pt's sister at bedside.

## 2020-09-01 NOTE — ED Triage Notes (Signed)
PT BIB EMS for stroke like symptoms when her sister called her earlier in the day due to her not making sense when speaking. Pt would switch between [place] and [place], not making sense in either language. Pt had an unsteady gait,didn't know who she was or where she was. The CBG was read as HIGH on EMS CBG monitor. LKW is Monday when she was last seen by family.

## 2020-09-01 NOTE — ED Provider Notes (Addendum)
[facility] EMERGENCY DEPARTMENT Provider Note   CSN: [id number] Arrival date & time: 09/01/20  1428     History Chief Complaint  Patient presents with   Altered Mental Status    Jill Jefferson is a 60 y.o. female.  HPI Level 5 caveat due to altered mental status  Patient is a generally poor historian due to being somewhat confused however she states that she feels well denies any chest pain shortness of breath lightheadedness or dizziness.  Denies any pain fevers.  Seems the patient lives by herself in her house tells her normal activities of daily living but was found by her daughter today to be altered and confused seems that she was walking around without difficulty.  Does seem to have a history of bilateral lower extremity edema and diabetes.  She states she does not have a history of diabetes to me but seems somewhat confused about this.  I see the patient is on metformin uncertain whether she has been taking this or not.  Unsuccessfully attempted to contact daughter via phone.    Past Medical History:  Diagnosis Date   Diabetes ([place])    Edema    H. pylori infection 06/2019   H. pylori infection 06/2019   Hypertension    Proteinuria 11/2018    Patient Active Problem List   Diagnosis Date Noted   Hemoglobin A1c less than 7.0% 11/26/2018   Hyperglycemia 11/26/2018   Proteinuria 11/26/2018   Joint pain in fingers of both hands 08/03/2018   Bilateral lower extremity edema 05/03/2018   Diabetes mellitus ([place]) 01/31/2018   Age-related cataract of both eyes 01/31/2018   Elevated glucose 01/31/2018    History reviewed. No pertinent surgical history.   OB History     Gravida  5   Para      Term      Preterm      AB      Living  5      SAB      IAB      Ectopic      Multiple      Live Births              Family History  Problem Relation Age of Onset   Diabetes Mother    Hypertension Mother     Social History    Tobacco Use   Smoking status: Never   Smokeless tobacco: Never  Vaping Use   Vaping Use: Never used  Substance Use Topics   Alcohol use: Never   Drug use: Never    Home Medications Prior to Admission medications   Medication Sig Start Date End Date Taking? Authorizing Provider  acetaminophen (TYLENOL) 500 MG tablet Take 1 tablet (500 mg total) by mouth 2 (two) times a day. 08/01/18   [name], FNP  amLODipine (NORVASC) 10 MG tablet TAKE 1 TABLET (10 MG TOTAL) BY MOUTH DAILY. 09/21/19 09/20/20  [name], FNP  amoxicillin (AMOXIL) 500 MG tablet Take 2 capsules (1,000 mg= Total), by mouth, 2 times a day X 14 days. 06/29/19   [name], FNP  atorvastatin (LIPITOR) 10 MG tablet TAKE 1 TABLET (10 MG TOTAL) BY MOUTH DAILY. 09/21/19 09/20/20  [name], FNP  blood glucose meter kit and supplies Dispense based on patient and insurance preference. Use up to four times daily as directed. (FOR ICD-10 E10.9, E11.9). 01/14/18   [name], FNP  calcium-vitamin D (OSCAL WITH D) 250-125 MG-UNIT tablet  Take 1 tablet by mouth daily.    [provider]  cholecalciferol (VITAMIN D3) 25 MCG (1000 UT) tablet Take 1,000 Units by mouth daily.    [provider]  furosemide (LASIX) 20 MG tablet TAKE 1 TABLET BY MOUTH DAILY. Patient not taking: Reported on 06/23/2019 09/01/18   [name], FNP  glipiZIDE (GLUCOTROL) 10 MG tablet TAKE 1 TABLET (10 MG TOTAL) BY MOUTH 2 (TWO) TIMES DAILY BEFORE A MEAL. 09/21/19 09/20/20  [name], FNP  hydrochlorothiazide (HYDRODIURIL) 25 MG tablet TAKE 1 TABLET (25 MG TOTAL) BY MOUTH DAILY. 09/21/19 09/20/20  [name], FNP  insulin glargine (LANTUS) 100 UNIT/ML injection Inject 0.2 mLs (20 Units total) into the skin at bedtime. Patient not taking: Reported on 02/25/2019 05/02/18   [name], FNP  lisinopril (ZESTRIL) 10 MG tablet TAKE 1 TABLET (10 MG TOTAL) BY MOUTH DAILY. 09/25/19   [name], FNP   magnesium 30 MG tablet Take 30 mg by mouth 2 (two) times daily.    [provider]  metFORMIN (GLUCOPHAGE) 1000 MG tablet TAKE 1 TABLET (1,000 MG TOTAL) BY MOUTH 2 (TWO) TIMES DAILY WITH A MEAL. 06/23/19 06/22/20  [name], FNP  omeprazole (PRILOSEC) 40 MG capsule Take 1 capsule (40 mg total) by mouth daily for 14 days. 06/29/19 07/13/19  [name], FNP  potassium chloride (K-DUR,KLOR-CON) 10 MEQ tablet Take 10 mEq by mouth once.    [provider]  vitamin E 100 UNIT capsule Take 200 Units by mouth daily. Patient not taking: Reported on 06/23/2019    [provider]    Allergies    Patient has no known allergies.  Review of Systems   Review of Systems  Unable to perform ROS: Mental status change   Physical Exam Updated Vital Signs BP (!) 205/108 (BP Location: Right Arm)   Pulse (!) 101   Temp 98.4 F (36.9 C) (Oral)   Resp 14   SpO2 94%   Physical Exam Vitals and nursing note reviewed.  Constitutional:      General: She is not in acute distress. HENT:     Head: Normocephalic and atraumatic.     Nose: Nose normal.     Mouth/Throat:     Mouth: Mucous membranes are dry.  Eyes:     General: No scleral icterus. Cardiovascular:     Rate and Rhythm: Normal rate and regular rhythm.     Pulses: Normal pulses.     Heart sounds: Normal heart sounds.  Pulmonary:     Effort: Pulmonary effort is normal. No respiratory distress.     Breath sounds: No wheezing.  Abdominal:     Palpations: Abdomen is soft.     Tenderness: There is no abdominal tenderness. There is no guarding or rebound.  Musculoskeletal:     Cervical back: Normal range of motion.     Right lower leg: Edema present.     Left lower leg: Edema present.     Comments: 1+ to the feet  Skin:    General: Skin is warm and dry.     Capillary Refill: Capillary refill takes less than 2 seconds.  Neurological:     Mental Status: She is alert.     Comments: Oriented to date and  self  Grip strength is bilaterally 5/5.  Able to lift each leg independently off the bed and hold for 6 seconds. Smile symmetric.  Extraocular movements intact.  Pupils reactive to light.  Able to follow commands.  Psychiatric:        Mood and Affect: Mood normal.        Behavior: Behavior normal.    ED Results / Procedures / Treatments   Labs (all labs ordered are listed, but only abnormal results are displayed) Labs Reviewed  CBC WITH DIFFERENTIAL/PLATELET - Abnormal; Notable for the following components:      Result Value   Neutro Abs 8.1 (*)    All other components within normal limits  CBG MONITORING, ED - Abnormal; Notable for the following components:   Glucose-Capillary >600 (*)    All other components within normal limits  I-STAT BETA HCG BLOOD, ED ([facility], [facility], [facility] ONLY) - Abnormal; Notable for the following components:   I-stat hCG, quantitative 10.6 (*)    All other components within normal limits  I-STAT VENOUS BLOOD GAS, ED - Abnormal; Notable for the following components:   pH, Ven 7.444 (*)    pCO2, Ven 37.9 (*)    Sodium 124 (*)    Calcium, Ion 0.91 (*)    All other components within normal limits  RESP PANEL BY RT-PCR (FLU A&B, COVID) ARPGX2  BRAIN NATRIURETIC PEPTIDE  OSMOLALITY  URINALYSIS, ROUTINE W REFLEX MICROSCOPIC  HEPATIC FUNCTION PANEL  ETHANOL  RAPID URINE DRUG SCREEN, HOSP PERFORMED  BASIC METABOLIC PANEL  LACTIC ACID, PLASMA    EKG None  Radiology No results found.  Procedures .Critical Care  Date/Time: 09/01/2020 3:41 PM Performed by: Tedd Sias, PA Authorized by: Tedd Sias, PA   Critical care provider statement:    Critical care time (minutes):  35   Critical care time was exclusive of:  Separately billable procedures and treating other patients and teaching time   Critical care was necessary to treat or prevent imminent or life-threatening deterioration of the following conditions: HHS.   Critical care was time spent  personally by me on the following activities:  Discussions with consultants, evaluation of patient's response to treatment, examination of patient, review of old charts, re-evaluation of patient's condition, pulse oximetry, ordering and review of radiographic studies, ordering and review of laboratory studies and ordering and performing treatments and interventions   I assumed direction of critical care for this patient from another provider in my specialty: no   Comments:     Altered mental status, hyperglycemia   Medications Ordered in ED Medications  lactated ringers bolus 1,000 mL (has no administration in time range)    ED Course  I have reviewed the triage vital signs and the nursing notes.  Pertinent labs & imaging results that were available during my care of the patient were reviewed by me and considered in my medical decision making (see chart for details).  Clinical Course as of 09/01/20 1543  Thu Sep 01, 2020  1451 Attempted to contact Altha Harm who is patient's daughter.  Phone number on file appears to be disconnected. [WF]    Clinical Course User Index [WF] Tedd Sias, [place]   MDM Rules/Calculators/A&P                           Patient is a 60 year old female history of diabetes came into the ER today with confusion no focal abnormalities on neurologic exam.  Overall patient is well-appearing somewhat dry oral mucosa hypertensive and tachycardic hypertension and tachycardia already somewhat improving with 400 mL of normal saline provided by EMS.  Patient does have some mild lower extremity  edema will obtain BNP.  I-STAT VBG patient is not acidotic.  Suspect HHS.  CBC without leukocytosis or anemia.  Chest x-ray reviewed.  Radiology read still pending I do not see any infiltrate or pulmonary edema.  Cardiac silhouette approximately the normal limits  Patient care transferred to oncoming team.  Final Clinical Impression(s) / ED Diagnoses Final diagnoses:   Hyperglycemia    Rx / DC Orders ED Discharge Orders     None        Tedd Sias, [place] 09/01/20 1543    Tedd Sias, [place] 09/01/20 1544    Lucrezia Starch, MD 09/02/20 [phone]

## 2020-09-01 NOTE — Plan of Care (Signed)
  Problem: Education: Goal: Knowledge of General Education information will improve Description Including pain rating scale, medication(s)/side effects and non-pharmacologic comfort measures Outcome: Progressing   

## 2020-09-01 NOTE — ED Provider Notes (Deleted)
.  Critical Care  Date/Time: 09/01/2020 9:10 PM Performed by: Luna Fuse, MD Authorized by: Luna Fuse, MD   Critical care provider statement:    Critical care time (minutes):  30   Critical care time was exclusive of:  Separately billable procedures and treating other patients and teaching time   Critical care was necessary to treat or prevent imminent or life-threatening deterioration of the following conditions:  Metabolic crisis    Luna Fuse, MD 09/01/20 2110

## 2020-09-01 NOTE — H&P (Signed)
History and Physical   Jill Jefferson VOH:[id number] DOB: 18-Jul-1960 DOA: 09/01/2020  PCP: [name], FNP (Inactive)   Patient coming from: Home  Chief Complaint: AMS  HPI: Jill Jefferson is a 60 y.o. female with medical history significant of cataracts, edema, diabetes, hypertension, H. pylori presents with altered mental status.  Patient presented altered and oriented to year and self only.  Unable to fully participate in history.  Additional history was obtained with assistance of chart review and family.  Her sister states that her hairstylist called earlier today because the patient canceled her appointment I did not sound right on the phone.  Another family member went to evaluate the patient and noted that she was talking strange and that she was speaking unintelligibly in both [place] and [place].  For me she is intermittently aphasic able to answer some questions appropriately and follows most commands appropriately.  At times when trying to discuss her difficulty expressing herself she repeats "something going on, something going on".  Patient denies fevers, chills, chest pain, shortness of breath, abdominal pain, constipation, diarrhea, nausea, vomiting.   ED Course: Vital signs in the ED significant for blood pressure initially in the 200s now in the 120s with intervention.  Initially tachycardic but now not.  Lab work-up showed BMP with sodium 129 but this corrects to normal considering glucose of 829.  Chloride 93, creatinine elevated 2.52 from baseline of around 1.6.  Calcium 8.8.  LFTs pending.  CBC within normal limits.  Lactic acid 2.5 with repeat pending.  Serum osmolality elevated at 322.  BNP normal.  Respiratory panel for flu and COVID-negative.  Urine analysis showed small hemoglobin and protein.  Ethanol level and UDS are pending.  Chest x-ray showed no acute normalities.  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  Patient given a dose of amlodipine, hydrochlorothiazide and labetalol in the ED.  Also started on insulin drip given 2 L of IV fluids and 2 doses of 10 mEq IV potassium.  Review of Systems: As per HPI otherwise all other systems reviewed and are negative.  Past Medical History:  Diagnosis Date   Diabetes ([place])    Edema    H. pylori infection 06/2019   H. pylori infection 06/2019   Hypertension    Proteinuria 11/2018    History reviewed. No pertinent surgical history.  Social History  reports that she has never smoked. She has never used smokeless tobacco. She reports that she does not drink alcohol and does not use drugs.  No Known Allergies  Family History  Problem Relation Age of Onset   Diabetes Mother    Hypertension Mother   Reviewed on admission  Prior to Admission medications   Medication Sig Start Date End Date Taking? Authorizing Provider  acetaminophen (TYLENOL) 500 MG tablet Take 1 tablet (500 mg total) by mouth 2 (two) times a day. 08/01/18   [name], FNP  amLODipine (NORVASC) 10 MG tablet TAKE 1 TABLET (10 MG TOTAL) BY MOUTH DAILY. 09/21/19 09/20/20  [name], FNP  amoxicillin (AMOXIL) 500 MG tablet Take 2 capsules (1,000 mg= Total), by mouth, 2 times a day X 14 days. 06/29/19   [name], FNP  atorvastatin (LIPITOR) 10 MG tablet TAKE 1 TABLET (10 MG TOTAL) BY MOUTH DAILY. 09/21/19 09/20/20  [name], FNP  blood glucose meter kit and supplies Dispense based on patient and insurance preference. Use up to four times  daily as directed. (FOR ICD-10 E10.9, E11.9). 01/14/18   [name], FNP  calcium-vitamin D (OSCAL WITH D) 250-125 MG-UNIT tablet Take 1 tablet by mouth daily.    [provider]  cholecalciferol (VITAMIN D3) 25 MCG (1000 UT) tablet Take 1,000 Units by mouth daily.    [provider]  furosemide (LASIX) 20 MG tablet TAKE 1 TABLET BY MOUTH DAILY. Patient not taking: Reported on 06/23/2019  09/01/18   [name], FNP  glipiZIDE (GLUCOTROL) 10 MG tablet TAKE 1 TABLET (10 MG TOTAL) BY MOUTH 2 (TWO) TIMES DAILY BEFORE A MEAL. 09/21/19 09/20/20  [name], FNP  hydrochlorothiazide (HYDRODIURIL) 25 MG tablet TAKE 1 TABLET (25 MG TOTAL) BY MOUTH DAILY. 09/21/19 09/20/20  [name], FNP  insulin glargine (LANTUS) 100 UNIT/ML injection Inject 0.2 mLs (20 Units total) into the skin at bedtime. Patient not taking: Reported on 02/25/2019 05/02/18   [name], FNP  lisinopril (ZESTRIL) 10 MG tablet TAKE 1 TABLET (10 MG TOTAL) BY MOUTH DAILY. 09/25/19   [name], FNP  magnesium 30 MG tablet Take 30 mg by mouth 2 (two) times daily.    [provider]  metFORMIN (GLUCOPHAGE) 1000 MG tablet TAKE 1 TABLET (1,000 MG TOTAL) BY MOUTH 2 (TWO) TIMES DAILY WITH A MEAL. 06/23/19 06/22/20  [name], FNP  omeprazole (PRILOSEC) 40 MG capsule Take 1 capsule (40 mg total) by mouth daily for 14 days. 06/29/19 07/13/19  [name], FNP  potassium chloride (K-DUR,KLOR-CON) 10 MEQ tablet Take 10 mEq by mouth once.    [provider]  vitamin E 100 UNIT capsule Take 200 Units by mouth daily. Patient not taking: Reported on 06/23/2019    [provider]    Physical Exam: Vitals:   09/01/20 1700 09/01/20 1842 09/01/20 1900 09/01/20 2000  BP: (!) 200/99 (!) 196/94 136/90 (!) 119/95  Pulse: 84 81 91 83  Resp: _0 Temp:      TempSrc:      SpO2: 99% 98% 97% 97%   Physical Exam Constitutional:      General: She is not in acute distress.    Appearance: Normal appearance.  HENT:     Head: Normocephalic and atraumatic.     Mouth/Throat:     Mouth: Mucous membranes are moist.     Pharynx: Oropharynx is clear.  Eyes:     Extraocular Movements: Extraocular movements intact.     Pupils: Pupils are equal, round, and reactive to light.  Cardiovascular:     Rate and Rhythm: Normal rate and regular rhythm.     Pulses: Normal pulses.      Heart sounds: Normal heart sounds.  Pulmonary:     Effort: Pulmonary effort is normal. No respiratory distress.     Breath sounds: Normal breath sounds.  Abdominal:     General: Bowel sounds are normal. There is no distension.     Palpations: Abdomen is soft.     Tenderness: There is no abdominal tenderness.  Musculoskeletal:        General: No swelling or deformity.  Skin:    General: Skin is warm and dry.  Neurological:     Comments: Mental Status: Patient is awake, alert, oriented to person and year Intermittent expressive and receptive aphasia present Cranial Nerves: II: Pupils equal, round, and reactive to light.   III,IV, VI: EOMI without ptosis or diploplia.  V: Facial sensation is symmetric to light touch. VII:  Facial movement is symmetric.  VIII: hearing is intact to voice X: Uvula elevates symmetrically XI: Shoulder shrug is symmetric. XII: tongue is midline without atrophy or fasciculations.  Motor: Decreased effort throughout seems to be due to some issues with receptive aphasia had difficulty holding leg up but was able to get her to hold leg against resistance, at Least 5/5 bilateral UE, 5/5 bilateral lower extremitiy  Sensory: Sensation is grossly intact bilateral UEs & LEs   Labs on Admission: I have personally reviewed following labs and imaging studies  CBC: Recent Labs  Lab 09/01/20 1450 09/01/20 1506  WBC 9.9  --   NEUTROABS 8.1*  --   HGB 12.8 13.6  HCT 38.3 40.0  MCV 80.6  --   PLT 296  --     Basic Metabolic Panel: Recent Labs  Lab 09/01/20 1458 09/01/20 1506  NA 129* 124*  K 3.6 3.9  CL 93*  --   CO2 27  --   GLUCOSE 821*  --   BUN 20  --   CREATININE 2.52*  --   CALCIUM 8.8*  --     GFR: CrCl cannot be calculated (Unknown ideal weight.).  Liver Function Tests: No results for input(s): AST, ALT, ALKPHOS, BILITOT, PROT, ALBUMIN in the last 168 hours.  Urine analysis:    Component Value Date/Time   COLORURINE COLORLESS (A)  09/01/2020 1450   APPEARANCEUR CLEAR 09/01/2020 1450   LABSPEC 1.017 09/01/2020 1450   PHURINE 7.0 09/01/2020 1450   GLUCOSEU >=500 (A) 09/01/2020 1450   HGBUR SMALL (A) 09/01/2020 1450   BILIRUBINUR NEGATIVE 09/01/2020 1450   BILIRUBINUR neg 06/23/2019 1148   KETONESUR NEGATIVE 09/01/2020 1450   PROTEINUR 100 (A) 09/01/2020 1450   UROBILINOGEN 0.2 06/23/2019 1148   NITRITE NEGATIVE 09/01/2020 1450   LEUKOCYTESUR NEGATIVE 09/01/2020 1450    Radiological Exams on Admission: DG Chest 2 View  Result Date: 09/01/2020 CLINICAL DATA:  Altered mental status EXAM: CHEST - 2 VIEW COMPARISON:  None. FINDINGS: The cardiomediastinal silhouette is normal. There is no focal consolidation or pulmonary edema. There is no pleural effusion or pneumothorax. There is no acute osseous abnormality. IMPRESSION: No radiographic evidence of acute cardiopulmonary process. Electronically Signed   By: Valetta Mole M.D.   On: 09/01/2020 15:40   CT Head Wo Contrast  Result Date: 09/01/2020 CLINICAL DATA:  Mental status change, unknown cause EXAM: CT HEAD WITHOUT CONTRAST TECHNIQUE: Contiguous axial images were obtained from the base of the skull through the vertex without intravenous contrast. COMPARISON:  None. FINDINGS: Brain: Encephalomalacia in the left occipital lobe, presumably old infarct. No acute intracranial abnormality. Specifically, no hemorrhage, hydrocephalus, mass lesion, acute infarction, or significant intracranial injury. Vascular: No hyperdense vessel or unexpected calcification. Skull: No acute calvarial abnormality. 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 No acute intracranial abnormality. Electronically Signed   By: Rolm Baptise M.D.   On: 09/01/2020 18:06    EKG: Independently reviewed.  Sinus tachycardia at 100 bpm.  Nonspecific ST changes with some flattening in lead III.  Assessment/Plan Principal Problem:   Hyperosmolar  hyperglycemic state (HHS) ([place]) Active Problems:   Diabetes mellitus ([place])   Bilateral lower extremity edema   Focal neurological deficit   AKI (acute kidney injury) ([place])  Acute encephalopathy > Suspect to be metabolic encephalopathy in setting of HHS as this has been improving per EDP. > No focal deficits on exam.  Does have history of prior occipital infarct based  on CT scan results. > She still has not returned to baseline and she has no clear etiology for her altered mentation based on initial work-up in the ED despite her HHS.  Given this and some aphasic qualities we will check MRI brain before we continue to aggressively treat her hypertension. - Monitor on telemetry - MR brain - Continue to treat HHS as below Will plan for stroke work up as below  - Neurology consult  - Allow for permissive HTN (systolic < 409 and diastolic < 811)  - ASA   - Continue Statin  - Echocardiogram  - Carotid doppler  -  A1C  - Lipid panel  - Tele monitoring  - SLP eval - PT/OT   HHS > Patient presenting altered as above.  Found to have glucose in the 800s.  Serum osmolality 322.  Mildly elevated lactic acid 2.5 with a creatinine of 2.5 elevated from baseline of 1.6. - Admit to progessive - Start on insulin drip - Received potassium supplementation 33mq IV in ED - LR at 125 mL/hr until CBG less than 250 - Switch to D5-LR when 1 CBG less than 250 - Nothing by mouth  - CBG Q1H - Once glucose normalized, start CM diet and if able to eat, administer Lantus 15 Units - Continue insulin drip for 1-2 more hours, then discontinue and start SSI-S  - DC fluids if eating, drinking, and off insulin drip  AKI > Creatinine elevated to 2.52 from baseline around 1.6. > Received 2 L in the ED as above. - Avoid nephrotoxic agents - Continue IV fluids - Trend renal function and electrolytes  Hypertension > BP initially elevated to 2914systolic in the ED now improved to the 120s with multiple  interventions in the ED. - We will hold off on additional interventions while awaiting MRI brain to further evaluate her mentation.    DVT prophylaxis: Lovenox  Code Status:   Full  Family Communication:  Attempted to contact family.  I briefly reached her sister and then we were disconnected and then it went straight to voicemail after that.  The number for her daughter gives a message of this number is not connected. Disposition Plan:   Patient is from:  Home  Anticipated DC to:  Home  Anticipated DC date:  1 to 4 days  Anticipated DC barriers: None  Consults called:  None  Admission status:  Observation, progressive  Severity of Illness: The appropriate patient status for this patient is OBSERVATION. Observation status is judged to be reasonable and necessary in order to provide the required intensity of service to ensure the patient's safety. The patient's presenting symptoms, physical exam findings, and initial radiographic and laboratory data in the context of their medical condition is felt to place them at decreased risk for further clinical deterioration. Furthermore, it is anticipated that the patient will be medically stable for discharge from the hospital within 2 midnights of admission. The following factors support the patient status of observation.   " The patient's presenting symptoms include altered mental status, tachycardia. " The physical exam findings include altered mental status, aphasia . " The initial radiographic and laboratory data are  Lab work-up showed BMP with sodium 129 but this corrects to normal considering glucose of 829.  Chloride 93, creatinine elevated 2.52 from baseline of around 1.6.  Calcium 8.8.  LFTs pending.  CBC within normal limits.  Lactic acid 2.5 with repeat pending.  Serum osmolality elevated at 322.  BNP normal.  Respiratory panel for flu and COVID-negative.  Urine analysis showed small hemoglobin and protein.  Ethanol level and UDS are pending.   Chest x-ray showed no acute normalities.  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    Marcelyn Bruins MD [facility]  How to contact the [facility] Attending or Consulting provider [place] or covering provider during after hours [place], for this patient?   Check the care team in [facility] and look for a) attending/consulting [facility] provider listed and b) the [facility] team listed Log into [email or web address] and use [place]'s universal password to access. If you do not have the password, please contact the hospital operator. Locate the [facility] provider you are looking for under [facility] and page to a number that you can be directly reached. If you still have difficulty reaching the provider, please page the [facility] (Director on Call) for the Hospitalists listed on amion for assistance.  09/01/2020, 9:47 PM

## 2020-09-02 ENCOUNTER — Observation Stay (HOSPITAL_COMMUNITY): Payer: 59

## 2020-09-02 ENCOUNTER — Inpatient Hospital Stay (HOSPITAL_COMMUNITY): Payer: 59

## 2020-09-02 DIAGNOSIS — E1122 Type 2 diabetes mellitus with diabetic chronic kidney disease: Secondary | ICD-10-CM | POA: Diagnosis present

## 2020-09-02 DIAGNOSIS — Z794 Long term (current) use of insulin: Secondary | ICD-10-CM | POA: Diagnosis not present

## 2020-09-02 DIAGNOSIS — G9341 Metabolic encephalopathy: Secondary | ICD-10-CM | POA: Diagnosis present

## 2020-09-02 DIAGNOSIS — R569 Unspecified convulsions: Secondary | ICD-10-CM | POA: Diagnosis present

## 2020-09-02 DIAGNOSIS — E1165 Type 2 diabetes mellitus with hyperglycemia: Secondary | ICD-10-CM | POA: Diagnosis not present

## 2020-09-02 DIAGNOSIS — Z20822 Contact with and (suspected) exposure to covid-19: Secondary | ICD-10-CM | POA: Diagnosis present

## 2020-09-02 DIAGNOSIS — Z8249 Family history of ischemic heart disease and other diseases of the circulatory system: Secondary | ICD-10-CM | POA: Diagnosis not present

## 2020-09-02 DIAGNOSIS — R4182 Altered mental status, unspecified: Secondary | ICD-10-CM

## 2020-09-02 DIAGNOSIS — E785 Hyperlipidemia, unspecified: Secondary | ICD-10-CM | POA: Diagnosis present

## 2020-09-02 DIAGNOSIS — Z833 Family history of diabetes mellitus: Secondary | ICD-10-CM | POA: Diagnosis not present

## 2020-09-02 DIAGNOSIS — I129 Hypertensive chronic kidney disease with stage 1 through stage 4 chronic kidney disease, or unspecified chronic kidney disease: Secondary | ICD-10-CM | POA: Diagnosis present

## 2020-09-02 DIAGNOSIS — Z79899 Other long term (current) drug therapy: Secondary | ICD-10-CM | POA: Diagnosis not present

## 2020-09-02 DIAGNOSIS — N179 Acute kidney failure, unspecified: Secondary | ICD-10-CM | POA: Diagnosis present

## 2020-09-02 DIAGNOSIS — R601 Generalized edema: Secondary | ICD-10-CM

## 2020-09-02 DIAGNOSIS — N1832 Chronic kidney disease, stage 3b: Secondary | ICD-10-CM | POA: Diagnosis present

## 2020-09-02 DIAGNOSIS — Z7984 Long term (current) use of oral hypoglycemic drugs: Secondary | ICD-10-CM | POA: Diagnosis not present

## 2020-09-02 DIAGNOSIS — E876 Hypokalemia: Secondary | ICD-10-CM | POA: Diagnosis not present

## 2020-09-02 DIAGNOSIS — R4701 Aphasia: Secondary | ICD-10-CM | POA: Diagnosis present

## 2020-09-02 DIAGNOSIS — E1100 Type 2 diabetes mellitus with hyperosmolarity without nonketotic hyperglycemic-hyperosmolar coma (NKHHC): Secondary | ICD-10-CM | POA: Diagnosis present

## 2020-09-02 DIAGNOSIS — Z8673 Personal history of transient ischemic attack (TIA), and cerebral infarction without residual deficits: Secondary | ICD-10-CM | POA: Diagnosis not present

## 2020-09-02 LAB — BLOOD GAS, VENOUS
Acid-Base Excess: 5.5 mmol/L — ABNORMAL HIGH (ref 0.0–2.0)
Bicarbonate: 30.3 mmol/L — ABNORMAL HIGH (ref 20.0–28.0)
Drawn by: 4653
FIO2: 21
O2 Saturation: 65.1 %
Patient temperature: 37
pCO2, Ven: 50.7 mmHg (ref 44.0–60.0)
pH, Ven: 7.393 (ref 7.250–7.430)
pO2, Ven: 33.2 mmHg (ref 32.0–45.0)

## 2020-09-02 LAB — BASIC METABOLIC PANEL
Anion gap: 8 (ref 5–15)
Anion gap: 7 (ref 5–15)
Anion gap: 6 (ref 5–15)
BUN: 16 mg/dL (ref 6–20)
BUN: 13 mg/dL (ref 6–20)
BUN: 10 mg/dL (ref 6–20)
CO2: 28 mmol/L (ref 22–32)
CO2: 29 mmol/L (ref 22–32)
CO2: 29 mmol/L (ref 22–32)
Calcium: 8.9 mg/dL (ref 8.9–10.3)
Calcium: 8.7 mg/dL — ABNORMAL LOW (ref 8.9–10.3)
Calcium: 8.6 mg/dL — ABNORMAL LOW (ref 8.9–10.3)
Chloride: 102 mmol/L (ref 98–111)
Chloride: 100 mmol/L (ref 98–111)
Chloride: 96 mmol/L — ABNORMAL LOW (ref 98–111)
Creatinine, Ser: 1.98 mg/dL — ABNORMAL HIGH (ref 0.44–1.00)
Creatinine, Ser: 1.89 mg/dL — ABNORMAL HIGH (ref 0.44–1.00)
Creatinine, Ser: 1.87 mg/dL — ABNORMAL HIGH (ref 0.44–1.00)
GFR, Estimated: 29 mL/min — ABNORMAL LOW (ref >60)
GFR, Estimated: 30 mL/min — ABNORMAL LOW (ref >60)
GFR, Estimated: 31 mL/min — ABNORMAL LOW (ref >60)
Glucose, Bld: 134 mg/dL — ABNORMAL HIGH (ref 70–99)
Glucose, Bld: 133 mg/dL — ABNORMAL HIGH (ref 70–99)
Glucose, Bld: 258 mg/dL — ABNORMAL HIGH (ref 70–99)
Potassium: 2.8 mmol/L — ABNORMAL LOW (ref 3.5–5.1)
Potassium: 2.6 mmol/L — CL (ref 3.5–5.1)
Potassium: 3.4 mmol/L — ABNORMAL LOW (ref 3.5–5.1)
Sodium: 138 mmol/L (ref 135–145)
Sodium: 136 mmol/L (ref 135–145)
Sodium: 131 mmol/L — ABNORMAL LOW (ref 135–145)

## 2020-09-02 LAB — GLUCOSE, CAPILLARY
Glucose-Capillary: 122 mg/dL — ABNORMAL HIGH (ref 70–99)
Glucose-Capillary: 127 mg/dL — ABNORMAL HIGH (ref 70–99)
Glucose-Capillary: 152 mg/dL — ABNORMAL HIGH (ref 70–99)
Glucose-Capillary: 161 mg/dL — ABNORMAL HIGH (ref 70–99)
Glucose-Capillary: 178 mg/dL — ABNORMAL HIGH (ref 70–99)
Glucose-Capillary: 197 mg/dL — ABNORMAL HIGH (ref 70–99)
Glucose-Capillary: 209 mg/dL — ABNORMAL HIGH (ref 70–99)
Glucose-Capillary: 224 mg/dL — ABNORMAL HIGH (ref 70–99)
Glucose-Capillary: 339 mg/dL — ABNORMAL HIGH (ref 70–99)
Glucose-Capillary: 89 mg/dL (ref 70–99)

## 2020-09-02 LAB — LIPID PANEL
Cholesterol: 277 mg/dL — ABNORMAL HIGH (ref 0–200)
HDL: 42 mg/dL (ref >40)
LDL Cholesterol: 214 mg/dL — ABNORMAL HIGH (ref 0–99)
Total CHOL/HDL Ratio: 6.6 RATIO
Triglycerides: 104 mg/dL (ref <150)
VLDL: 21 mg/dL (ref 0–40)

## 2020-09-02 LAB — HEPATIC FUNCTION PANEL
ALT: 17 U/L (ref 0–44)
AST: 18 U/L (ref 15–41)
Albumin: 2.6 g/dL — ABNORMAL LOW (ref 3.5–5.0)
Alkaline Phosphatase: 78 U/L (ref 38–126)
Bilirubin, Direct: <0.1 mg/dL (ref 0.0–0.2)
Total Bilirubin: 0.6 mg/dL (ref 0.3–1.2)
Total Protein: 5.2 g/dL — ABNORMAL LOW (ref 6.5–8.1)

## 2020-09-02 LAB — HIV ANTIBODY (ROUTINE TESTING W REFLEX): HIV Screen 4th Generation wRfx: NONREACTIVE

## 2020-09-02 LAB — ECHOCARDIOGRAM COMPLETE
Area-P 1/2: 3.34 cm2
Height: 60 in
S' Lateral: 2.2 cm
Weight: 2292.78 oz

## 2020-09-02 LAB — ETHANOL: Alcohol, Ethyl (B): <10 mg/dL (ref <10)

## 2020-09-02 LAB — VITAMIN B12: Vitamin B-12: 651 pg/mL (ref 180–914)

## 2020-09-02 LAB — TSH: TSH: 0.757 u[IU]/mL (ref 0.350–4.500)

## 2020-09-02 LAB — FOLATE: Folate: 11.5 ng/mL (ref >5.9)

## 2020-09-02 LAB — MAGNESIUM: Magnesium: 1.6 mg/dL — ABNORMAL LOW (ref 1.7–2.4)

## 2020-09-02 IMAGING — DX DG ABDOMEN 1V
1 series · 2 of 2 positions shown · non-contrast
Comparison: None.

CLINICAL DATA: 60-year-old female with constipation.

EXAM:
ABDOMEN - 1 VIEW

[Series 1: abdomen · 0.14mm/px · 2 of 2 slices shown]
[im 1/2]
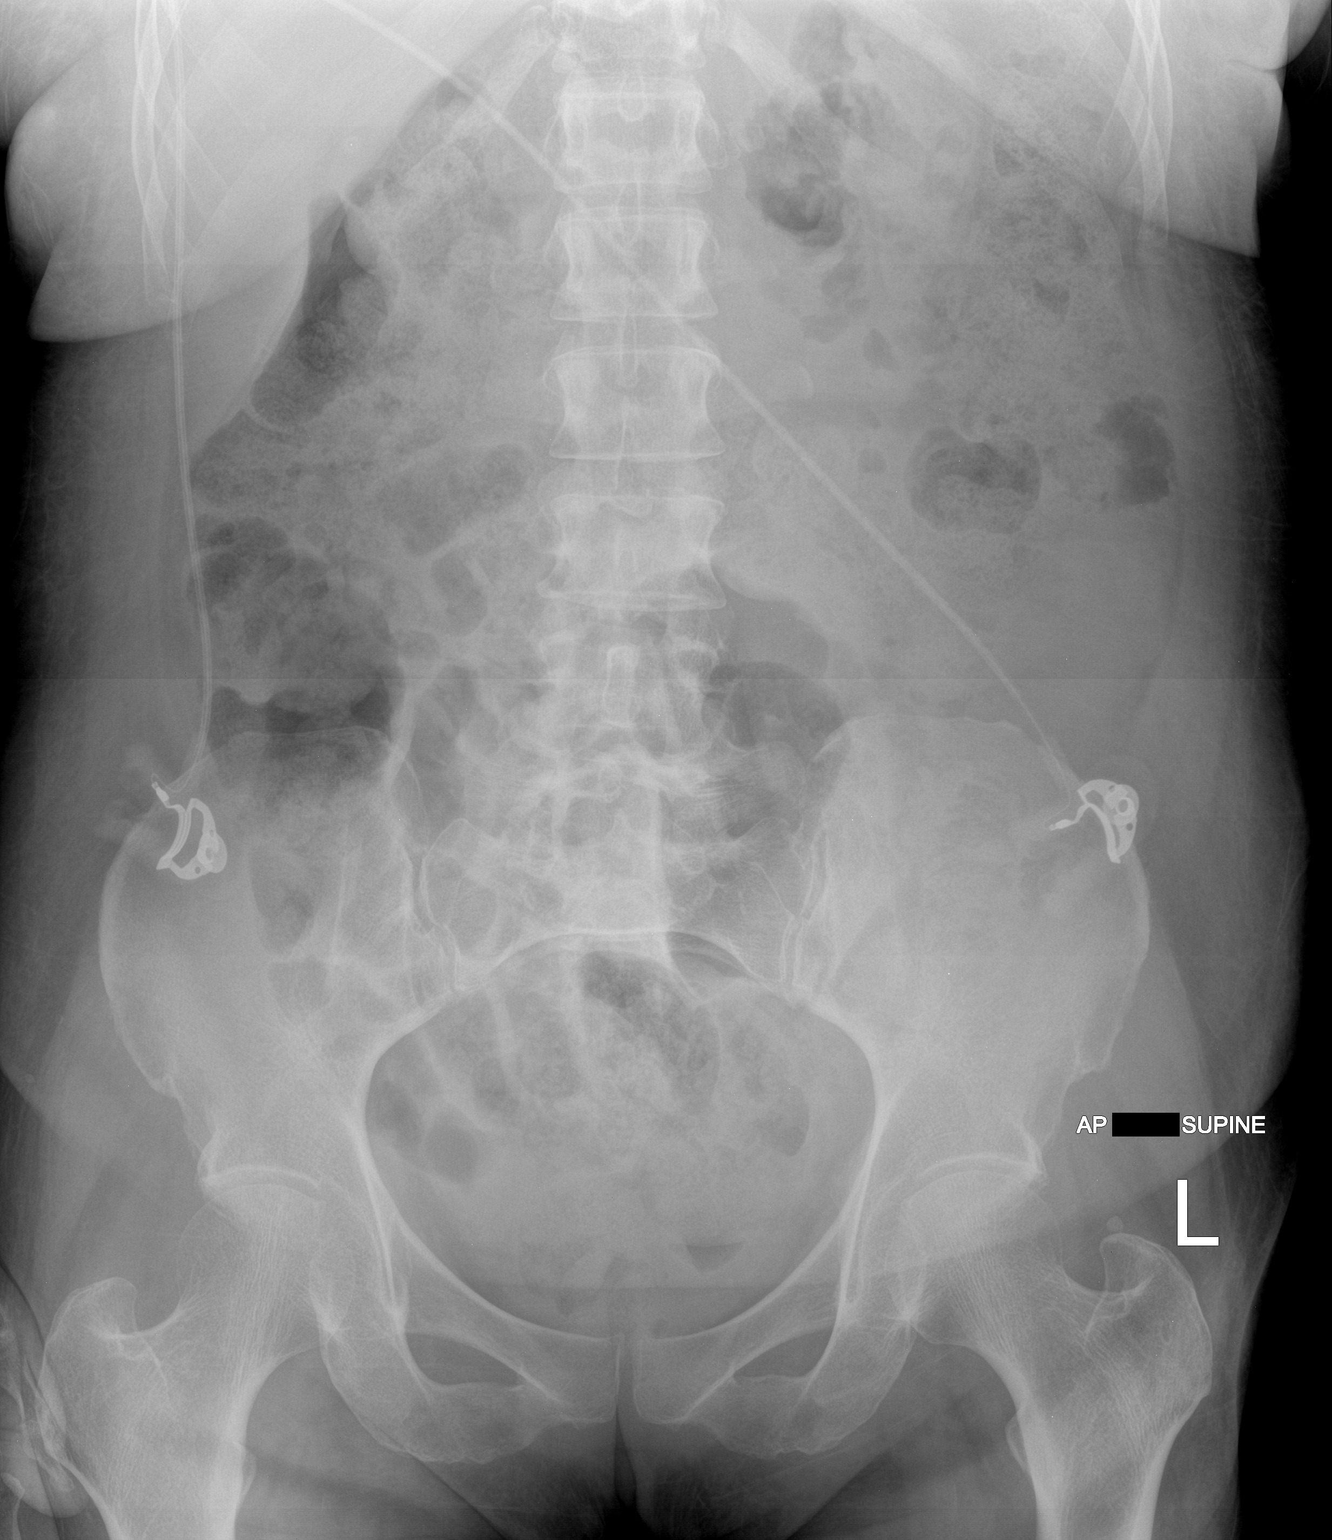
[im 2/2]
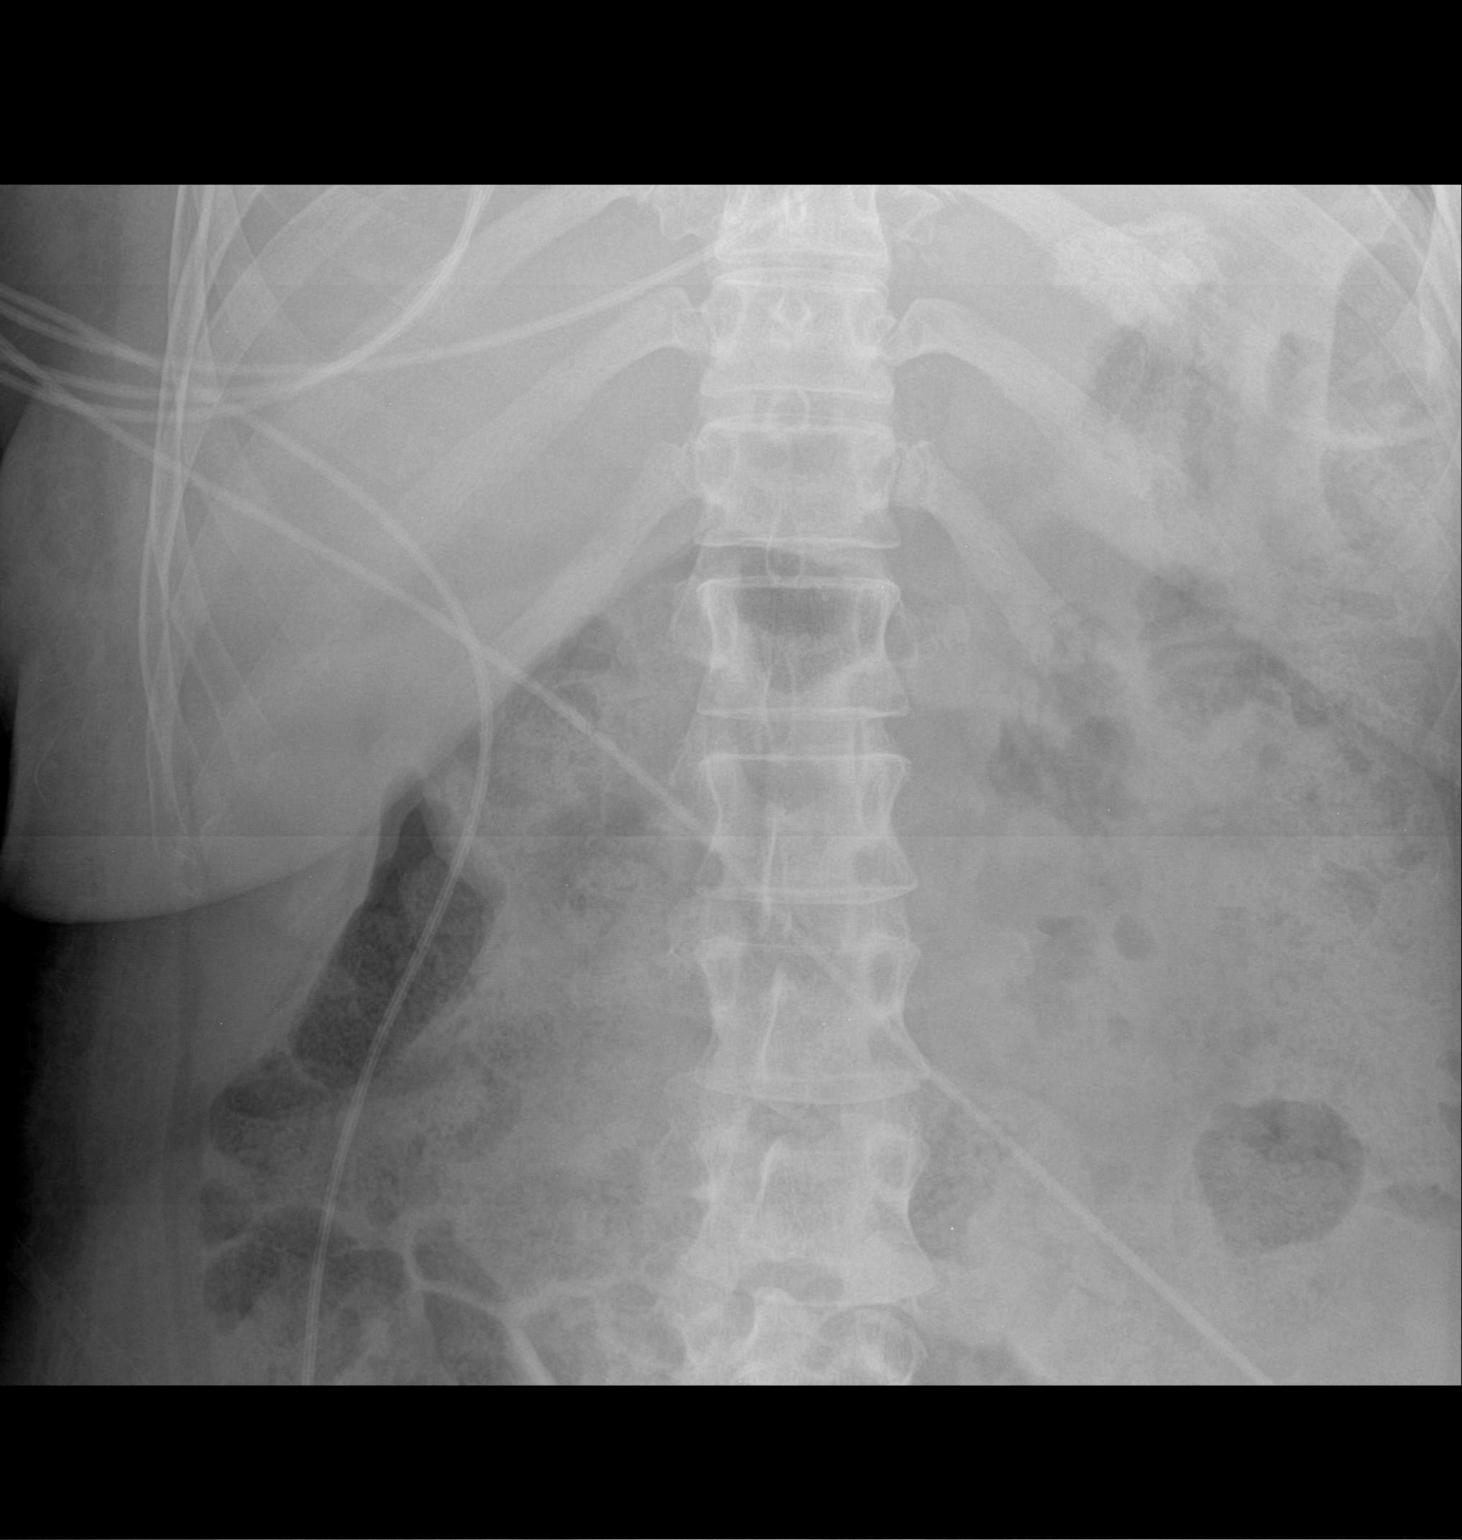

[2 of 2 positions shown; findings below may reference images not displayed]

FINDINGS: The bowel gas pattern is normal. No radio-opaque calculi or other
significant radiographic abnormality are seen.
IMPRESSION: Nonobstructive bowel gas pattern.

## 2020-09-02 IMAGING — MR MR HEAD W/O CM
9 of 10 series · 38 of 48 positions shown · non-contrast
Comparison: CT head [DATE]

CLINICAL DATA: Acute neuro deficit.  Altered mental status

EXAM:
MRI HEAD WITHOUT CONTRAST
TECHNIQUE: Multiplanar, multiecho pulse sequences of the brain and surrounding
structures were obtained without intravenous contrast.

[Series 3: DWI · axial · 3.0mm · 1.09mm/px · z∈[-60,+86]mm · 11 of 100 slices shown (1 of 4)]
[im 1/100]
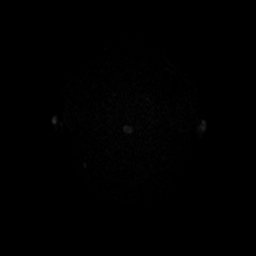
[im 10/100]
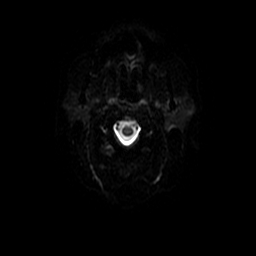
[im 20/100]
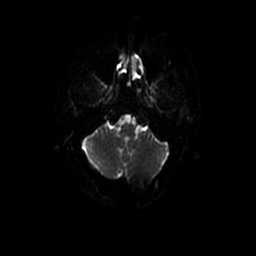
[im 30/100]
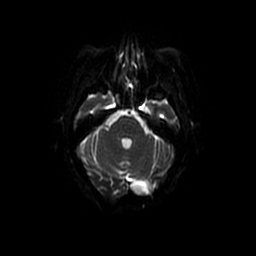
[im 40/100]
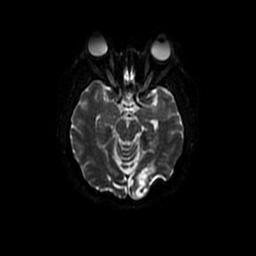
[im 50/100]
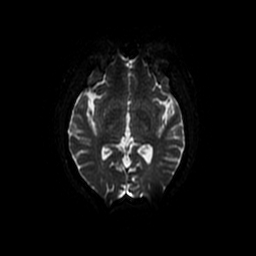
[im 60/100]
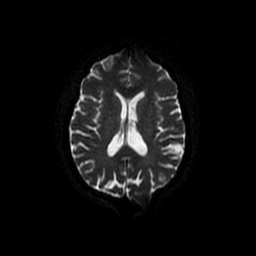
[im 70/100]
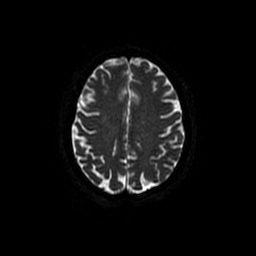
[im 80/100]
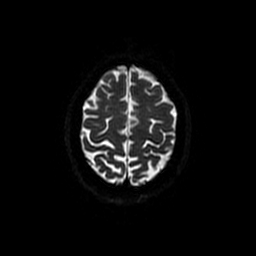
[im 90/100]
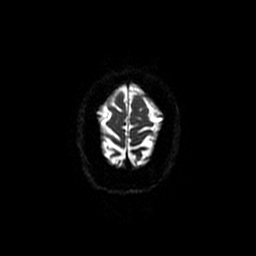
[im 100/100]
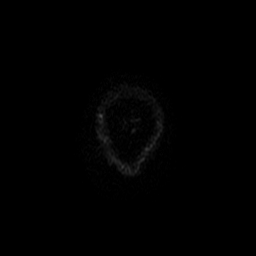

[Series 4: DWI · coronal · 5.0mm · 1.09mm/px · 7 of 72 slices shown (2 of 4)]
[im 1/72]
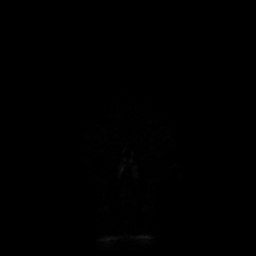
[im 12/72]
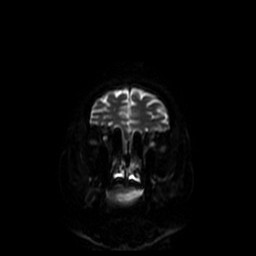
[im 24/72]
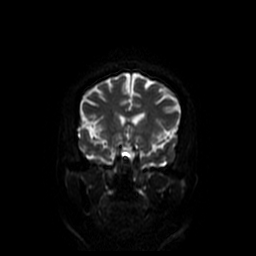
[im 36/72]
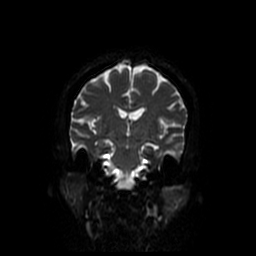
[im 48/72]
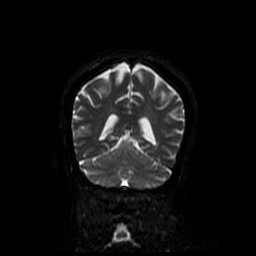
[im 60/72]
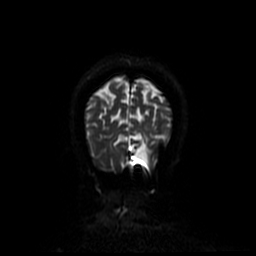
[im 72/72]
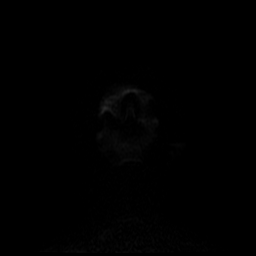

[Series 5: T1 · sagittal · 5.0mm · 0.47mm/px · 2 of 23 slices shown (1 of 2)]
[im 1/23]
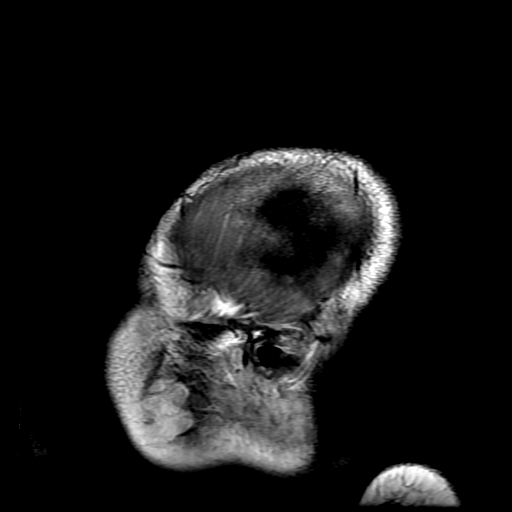
[im 23/23]
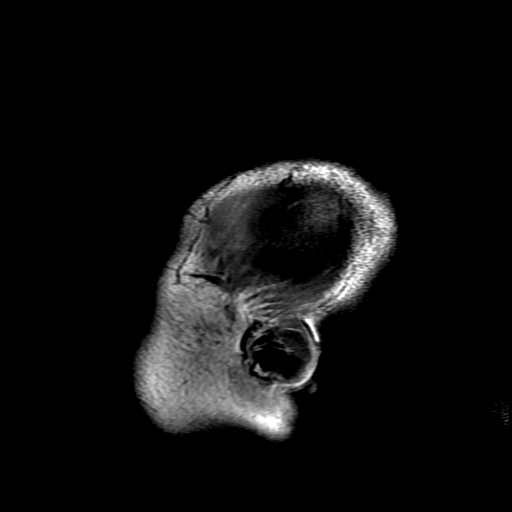

[Series 6: T2 · axial · 5.0mm · 0.43mm/px · z∈[-60,+83]mm · 2 of 25 slices shown (1 of 2)]
[im 1/25]
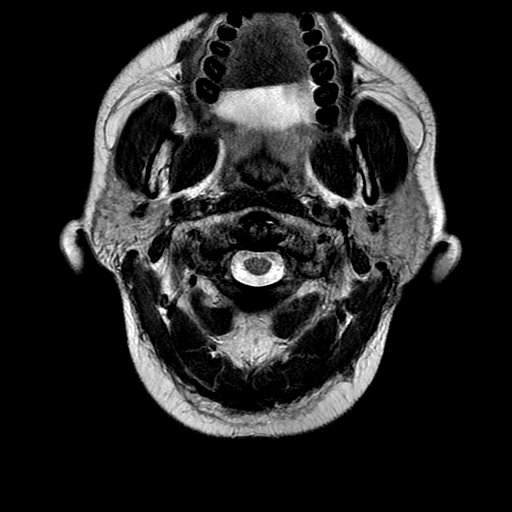
[im 25/25]
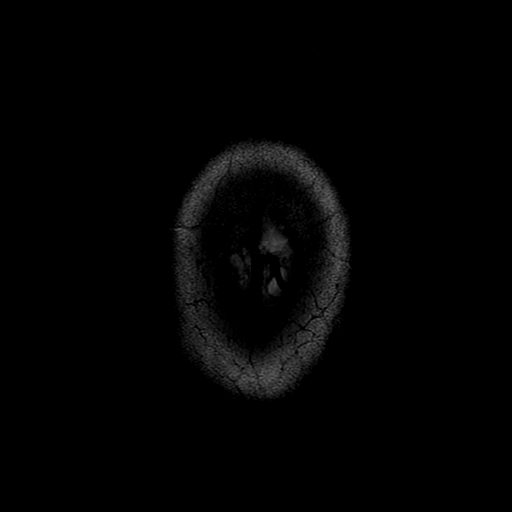

[Series 7: FLAIR · axial · 3.0mm · 0.43mm/px · z∈[-60,+83]mm · 2 of 25 slices shown]
[im 1/25]
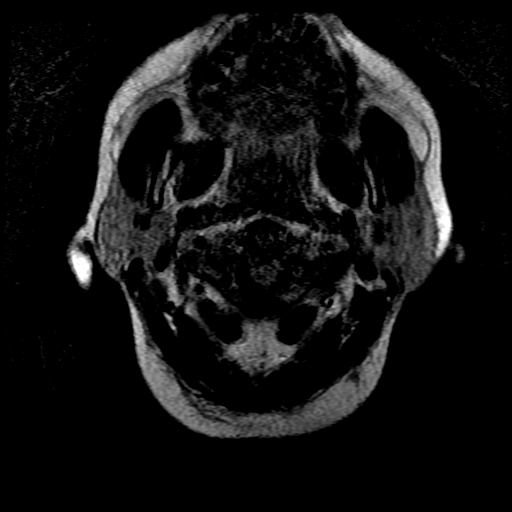
[im 25/25]
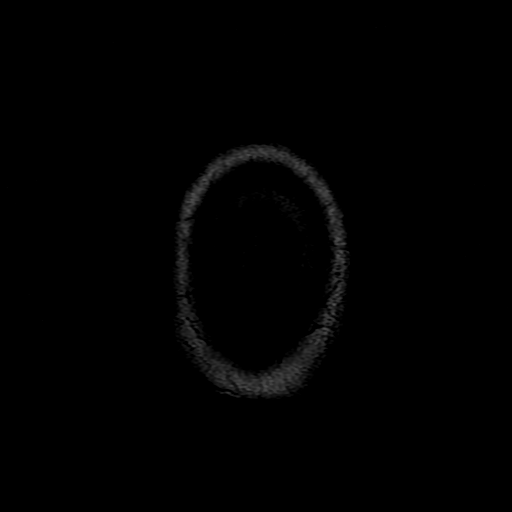

[Series 9: T1 · axial · 3.0mm · 0.47mm/px · z∈[-61,-45]mm · 2 of 100 slices shown (2 of 2)]
[im 1/100]
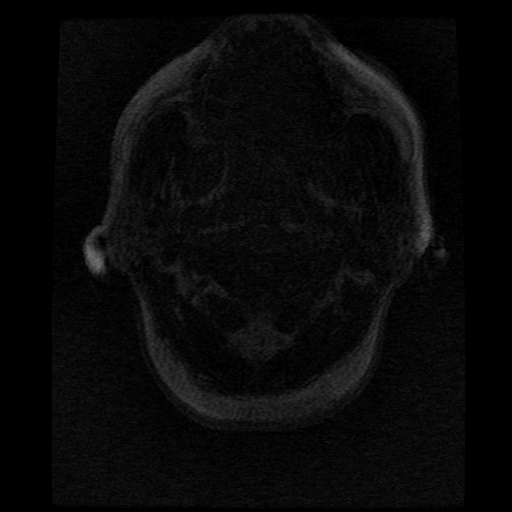
[im 12/100]
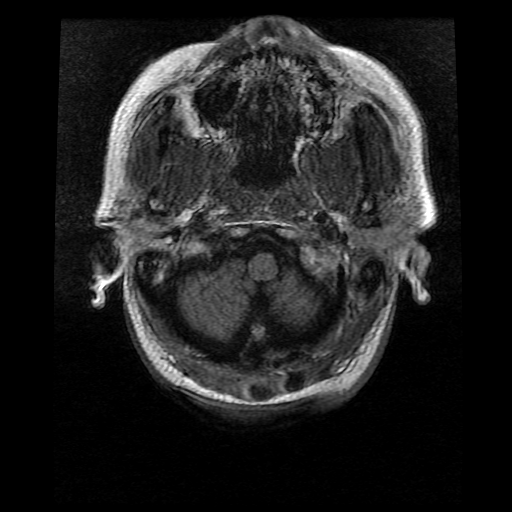

[Series 11: T2 · coronal · 5.0mm · 0.43mm/px · 3 of 31 slices shown (2 of 2)]
[im 1/31]
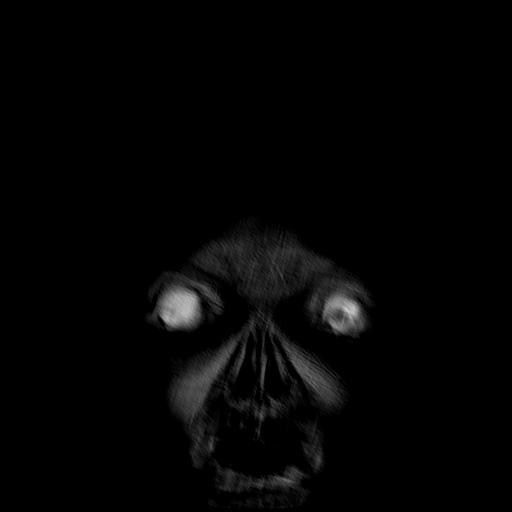
[im 16/31]
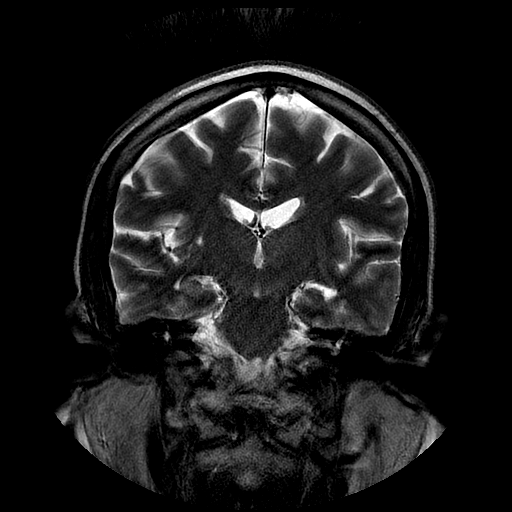
[im 31/31]
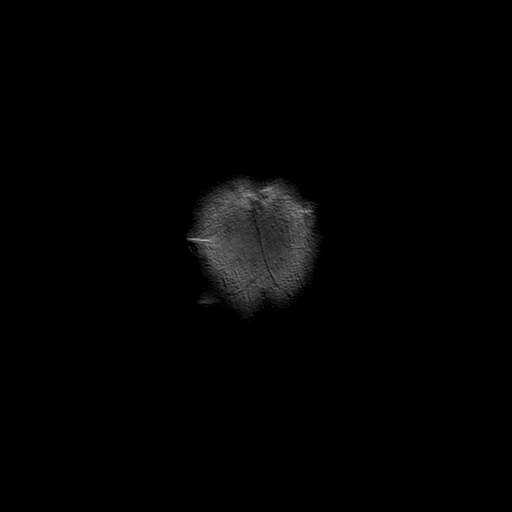

[Series 300: DWI · axial · 3.0mm · 1.09mm/px · z∈[-60,+86]mm · 5 of 50 slices shown (3 of 4)]
[im 1/50]
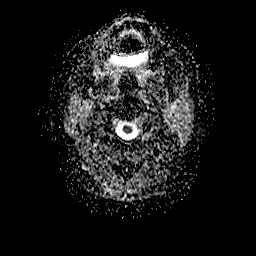
[im 13/50]
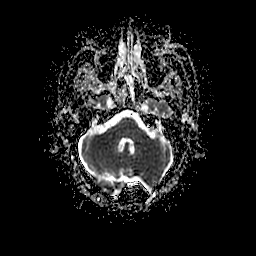
[im 25/50]
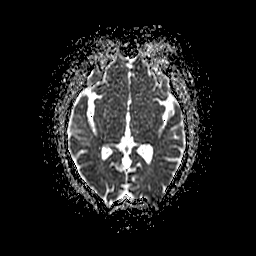
[im 37/50]
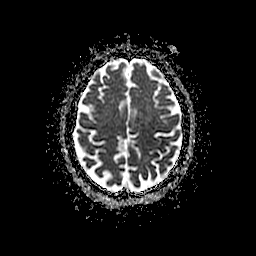
[im 50/50]
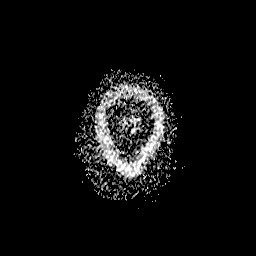

[Series 400: DWI · coronal · 5.0mm · 1.09mm/px · 4 of 36 slices shown (4 of 4)]
[im 1/36]
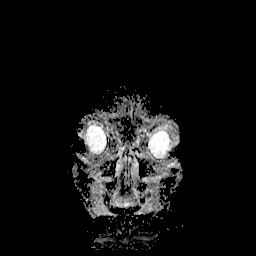
[im 12/36]
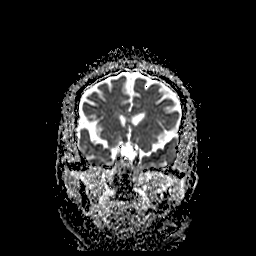
[im 24/36]
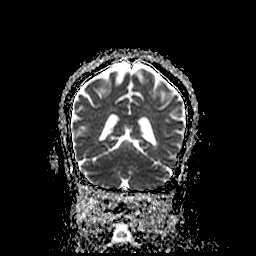
[im 36/36]
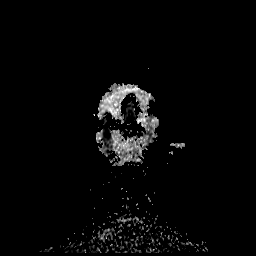

[38 of 48 positions shown; findings below may reference images not displayed]

FINDINGS: Brain: Ventricle size and cerebral volume normal. Enlarged sella
filled with CSF. Probable empty sella which is likely an incidental
finding.

Chronic encephalomalacia left occipital lobe with overlying
craniotomy. No mass-effect or edema.

Negative for acute infarct chronic blood products in the left
occipital encephalomalacia.

Vascular: Normal arterial flow voids

Skull and upper cervical spine: Left occipital craniotomy

Sinuses/Orbits: Mucosal edema paranasal sinuses. Right cataract
extraction

Other: None
IMPRESSION: No acute abnormality

Left occipital craniotomy with underlying chronic encephalomalacia.

## 2020-09-02 IMAGING — US US RENAL
1 series · 14 of 25 positions shown · non-contrast
Comparison: X-ray abdomen [DATE]

CLINICAL DATA: Acute renal injury.

EXAM:
RENAL / URINARY TRACT ULTRASOUND COMPLETE

[Series 1: us renal · 14 of 34 slices shown]
[im 1/34]
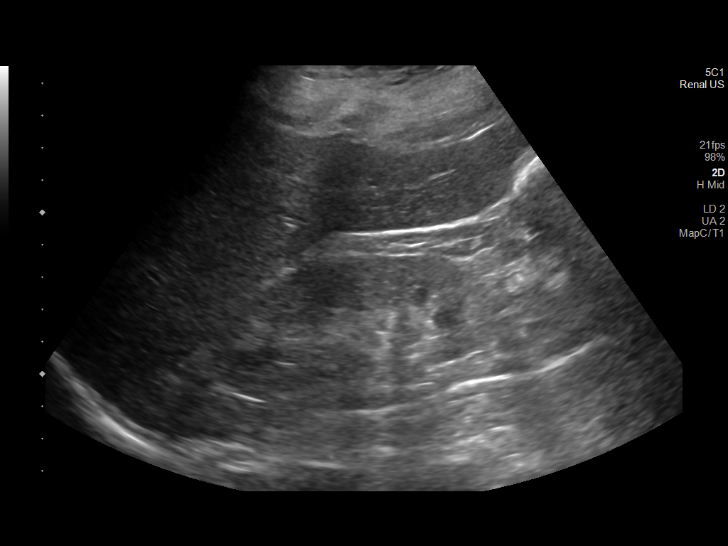
[im 3/34]
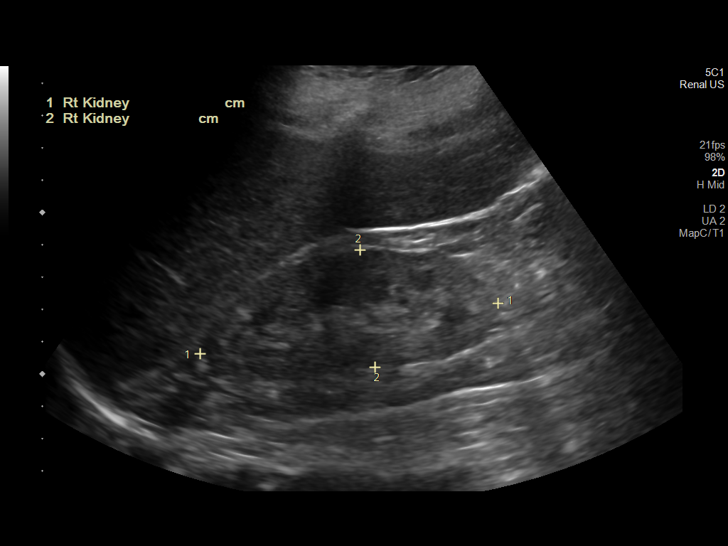
[im 6/34]
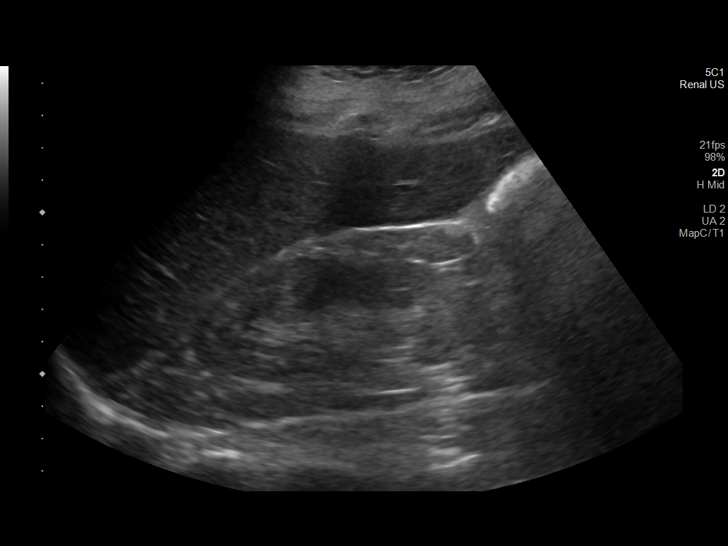
[im 9/34]
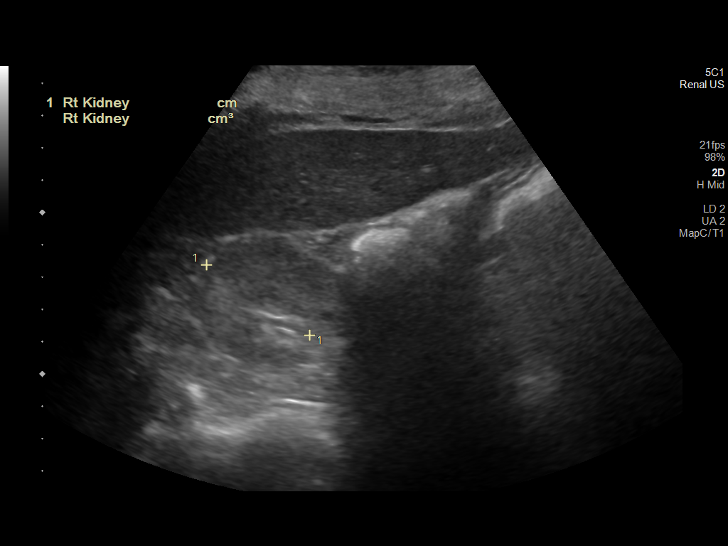
[im 12/34]
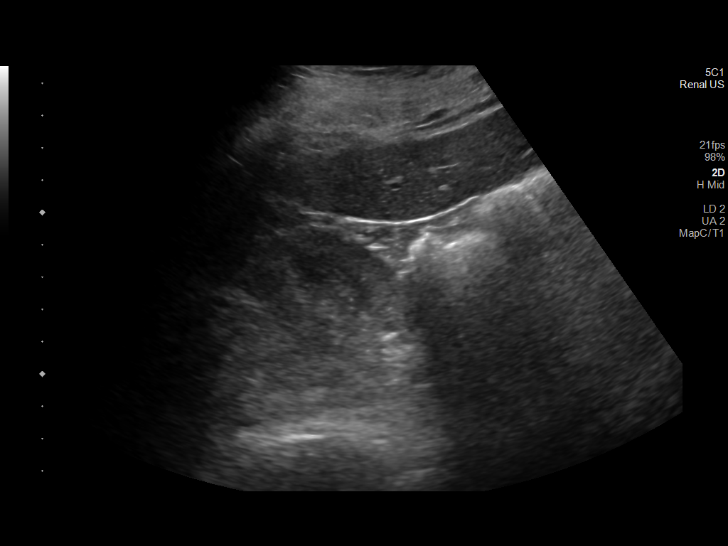
[im 13/34]
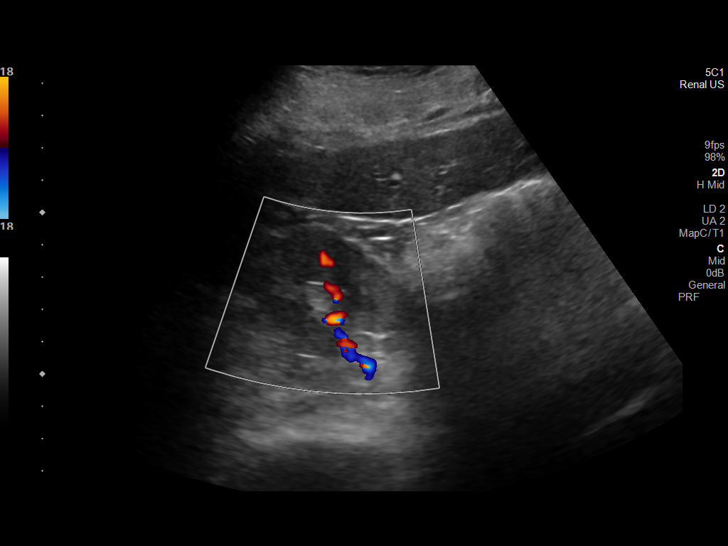
[im 16/34]
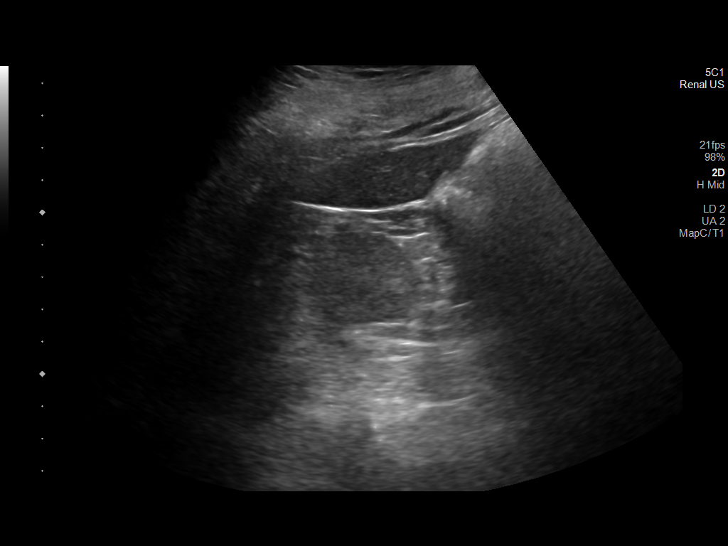
[im 18/34]
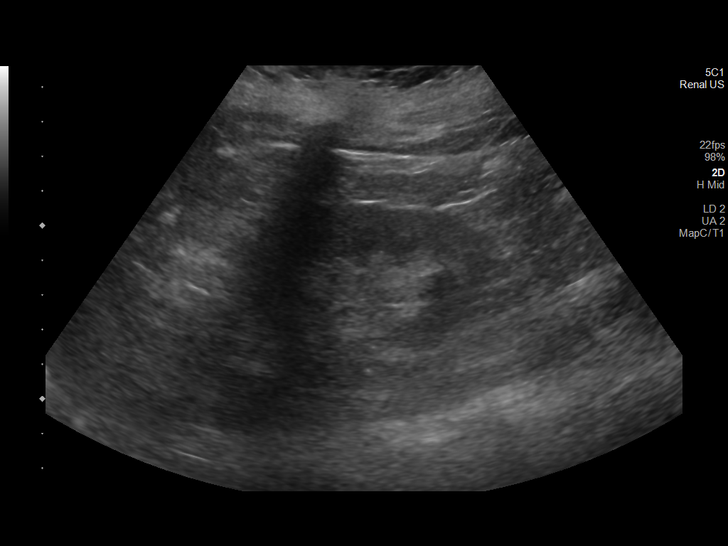
[im 21/34]
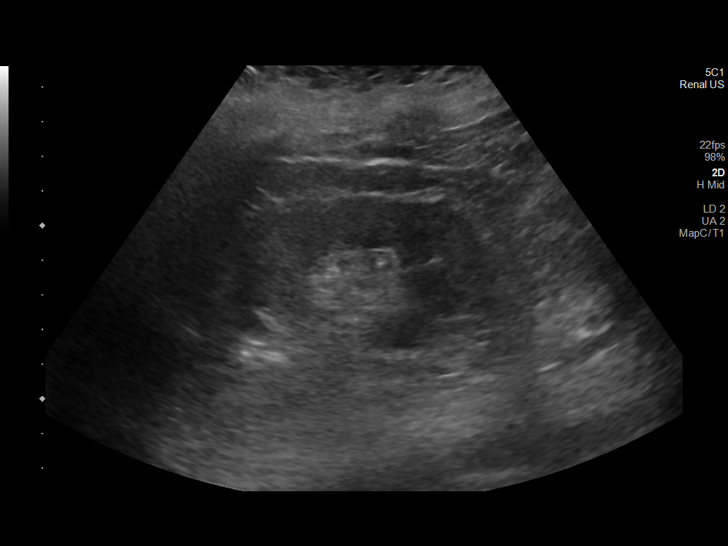
[im 23/34]
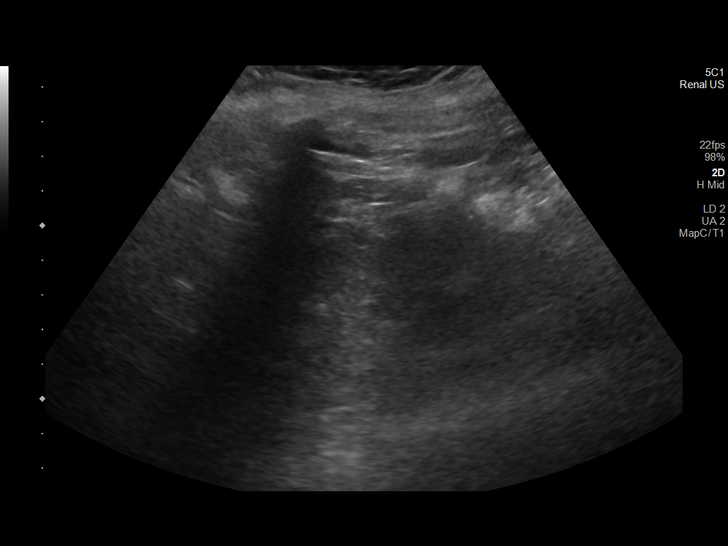
[im 25/34]
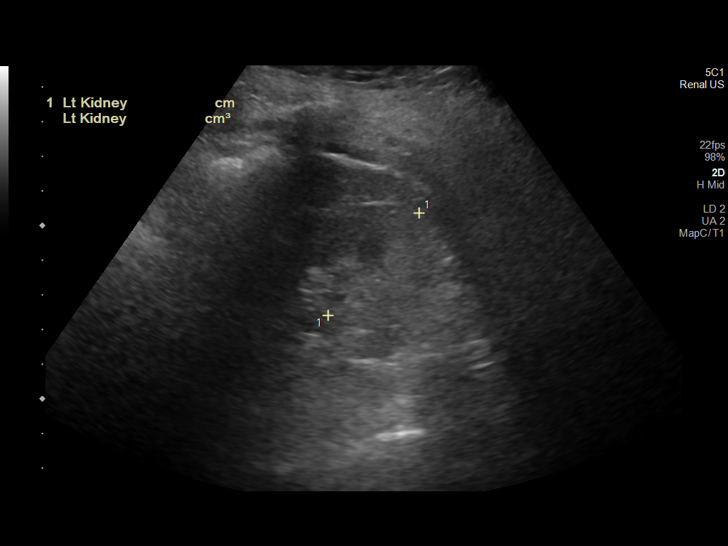
[im 28/34]
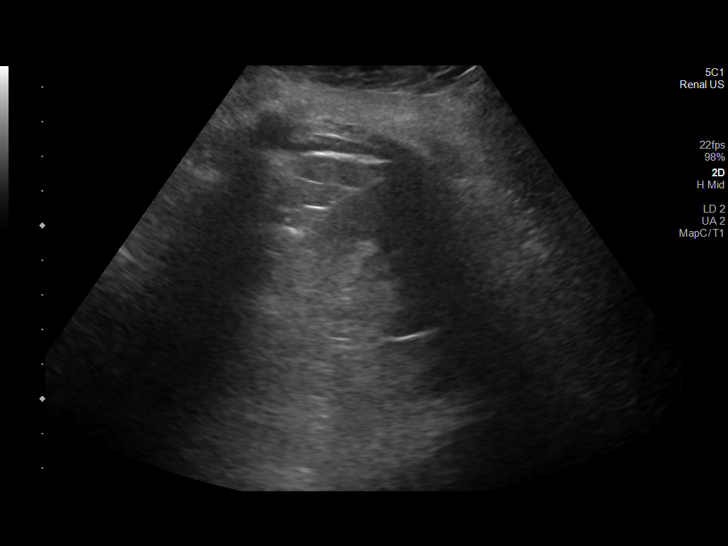
[im 31/34]
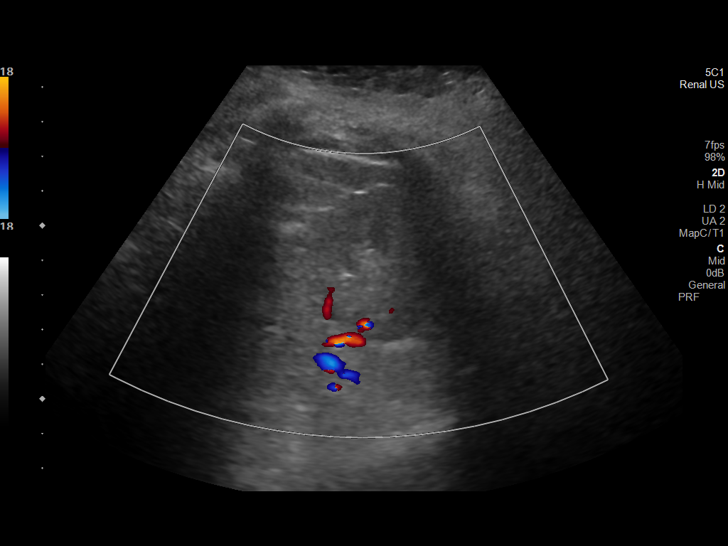
[im 34/34]
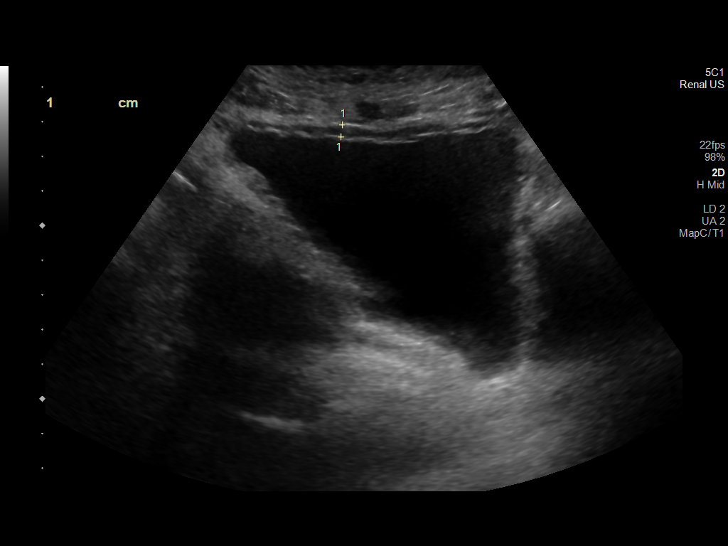

[14 of 25 positions shown; findings below may reference images not displayed]

FINDINGS: Right Kidney:

Renal measurements: 9.4 x 3.7 x 3.9 cm = volume: 69 mL mL. Mild
echogenicity increased. No mass or hydronephrosis visualized.

Left Kidney:

Renal measurements: 8.9 x 4.8 x 4 cm = volume: 88 mL mL. Mild
echogenicity increased no mass or hydronephrosis visualized.

Urinary:

Appears normal for degree of bladder distention.

Other:

None.
IMPRESSION: 1. Atrophic left kidney and borderline atrophic right kidney.
2. Slightly increased echogenicity of bilateral kidneys suggestive
of renal parenchymal disease.

## 2020-09-02 MED ORDER — POTASSIUM CHLORIDE CRYS ER 20 MEQ PO TBCR
40.0000 meq | EXTENDED_RELEASE_TABLET | ORAL | Status: AC
Start: 2020-09-02 — End: 2020-09-02
  Administered 2020-09-02: 40 meq via ORAL
  Filled 2020-09-02: qty 2

## 2020-09-02 MED ORDER — PERFLUTREN LIPID MICROSPHERE
1.0000 mL | INTRAVENOUS | Status: AC | PRN
  Administered 2020-09-02: 4 mL via INTRAVENOUS
  Filled 2020-09-02: qty 10

## 2020-09-02 MED ORDER — POTASSIUM CHLORIDE 10 MEQ/100ML IV SOLN
10.0000 meq | INTRAVENOUS | Status: AC
  Administered 2020-09-02 (×4): 10 meq via INTRAVENOUS
  Filled 2020-09-02 (×4): qty 100

## 2020-09-02 MED ORDER — INSULIN DETEMIR 100 UNIT/ML ~~LOC~~ SOLN
10.0000 [IU] | Freq: Every day | SUBCUTANEOUS | Status: DC
  Administered 2020-09-02 – 2020-09-07 (×6): 10 [IU] via SUBCUTANEOUS
  Filled 2020-09-02 (×6): qty 0.1

## 2020-09-02 MED ORDER — INSULIN ASPART 100 UNIT/ML IJ SOLN
3.0000 [IU] | Freq: Three times a day (TID) | INTRAMUSCULAR | Status: DC
  Administered 2020-09-02 – 2020-09-06 (×11): 3 [IU] via SUBCUTANEOUS

## 2020-09-02 MED ORDER — MAGNESIUM SULFATE 2 GM/50ML IV SOLN
2.0000 g | Freq: Once | INTRAVENOUS | Status: AC
  Administered 2020-09-02: 2 g via INTRAVENOUS
  Filled 2020-09-02: qty 50

## 2020-09-02 MED ORDER — INSULIN ASPART 100 UNIT/ML IJ SOLN
0.0000 [IU] | Freq: Every day | INTRAMUSCULAR | Status: DC
  Administered 2020-09-02: 4 [IU] via SUBCUTANEOUS
  Administered 2020-09-04: 3 [IU] via SUBCUTANEOUS

## 2020-09-02 MED ORDER — LACTATED RINGERS IV SOLN: INTRAVENOUS | Status: DC

## 2020-09-02 MED ORDER — POTASSIUM CHLORIDE CRYS ER 20 MEQ PO TBCR: 40.0000 meq | EXTENDED_RELEASE_TABLET | ORAL | Status: DC

## 2020-09-02 MED ORDER — ASPIRIN EC 81 MG PO TBEC
81.0000 mg | DELAYED_RELEASE_TABLET | Freq: Every day | ORAL | Status: DC
  Administered 2020-09-02 – 2020-09-07 (×6): 81 mg via ORAL
  Filled 2020-09-02 (×6): qty 1

## 2020-09-02 MED ORDER — THIAMINE HCL 100 MG/ML IJ SOLN
500.0000 mg | Freq: Three times a day (TID) | INTRAVENOUS | Status: AC
  Administered 2020-09-02 – 2020-09-03 (×3): 500 mg via INTRAVENOUS
  Filled 2020-09-02 (×3): qty 5

## 2020-09-02 MED ORDER — INSULIN ASPART 100 UNIT/ML IJ SOLN
0.0000 [IU] | Freq: Three times a day (TID) | INTRAMUSCULAR | Status: DC
  Administered 2020-09-02: 3 [IU] via SUBCUTANEOUS
  Administered 2020-09-03: 2 [IU] via SUBCUTANEOUS
  Administered 2020-09-03: 7 [IU] via SUBCUTANEOUS
  Administered 2020-09-03: 2 [IU] via SUBCUTANEOUS
  Administered 2020-09-04: 3 [IU] via SUBCUTANEOUS
  Administered 2020-09-04: 2 [IU] via SUBCUTANEOUS
  Administered 2020-09-05: 5 [IU] via SUBCUTANEOUS
  Administered 2020-09-05: 7 [IU] via SUBCUTANEOUS
  Administered 2020-09-06: 1 [IU] via SUBCUTANEOUS
  Administered 2020-09-06: 3 [IU] via SUBCUTANEOUS
  Administered 2020-09-06: 2 [IU] via SUBCUTANEOUS
  Administered 2020-09-07: 9 [IU] via SUBCUTANEOUS
  Administered 2020-09-07: 3 [IU] via SUBCUTANEOUS

## 2020-09-02 MED ORDER — THIAMINE HCL 100 MG/ML IJ SOLN
250.0000 mg | Freq: Every day | INTRAVENOUS | Status: DC
  Administered 2020-09-04 – 2020-09-07 (×4): 250 mg via INTRAVENOUS
  Filled 2020-09-02 (×4): qty 2.5

## 2020-09-02 MED ORDER — INSULIN ASPART 100 UNIT/ML IJ SOLN
0.0000 [IU] | INTRAMUSCULAR | Status: DC
Start: 2020-09-02 — End: 2020-09-02
  Administered 2020-09-02: 3 [IU] via SUBCUTANEOUS
  Administered 2020-09-02: 1 [IU] via SUBCUTANEOUS

## 2020-09-02 MED ORDER — ATORVASTATIN CALCIUM 40 MG PO TABS
40.0000 mg | ORAL_TABLET | Freq: Every day | ORAL | Status: DC
  Administered 2020-09-03 – 2020-09-07 (×5): 40 mg via ORAL
  Filled 2020-09-02 (×5): qty 1

## 2020-09-02 MED ORDER — THIAMINE HCL 100 MG/ML IJ SOLN: 100.0000 mg | Freq: Every day | INTRAMUSCULAR | Status: DC

## 2020-09-02 NOTE — Evaluation (Signed)
Physical Therapy Evaluation Patient Details Name: Jill Jefferson MRN: [id number] DOB: 07/09/1960 Today's Date: 09/02/2020   History of Present Illness  60 y/o female presented to ED on 8/25 for confusion. CBG on arrival >600 (up to 821). 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 PMH: cataracts, edema, diabetes, HTN, H.pylori infection  Clinical Impression  Difficult to obtain PLOF and home setup due to patient being poor historian. Patient with contradictory information throughout session about home setup. Patient with difficulty getting words out during session, however per patient this is baseline. Patient presents with generalized weakness, impaired balance, decreased activity tolerance, and impaired cognition. Patient ambulating at supervision level with no AD. Patient following commands inconsistently and requires repetition for remembering 2 step commands for wayfinding. Patient will benefit from skilled PT services during acute stay to address listed deficits. Anticipate no PT follow up at discharge but will continue to assess next session in hopes family will be present to provide more information about PLOF and cognition.    Follow Up Recommendations No PT follow up    Equipment Recommendations  None recommended by PT    Recommendations for Other Services       Precautions / Restrictions Precautions Precautions: Fall Restrictions Weight Bearing Restrictions: No      Mobility  Bed Mobility Overal bed mobility: Modified Independent                  Transfers Overall transfer level: Needs assistance Equipment used: None Transfers: Sit to/from Stand Sit to Stand: Supervision         General transfer comment: supervision for safety  Ambulation/Gait Ambulation/Gait assistance: Supervision Gait Distance (Feet): 200 Feet Assistive device: None Gait Pattern/deviations: Step-through pattern;Decreased  stride length;Drifts right/left Gait velocity: decreased   General Gait Details: Supervision for safety. Patient unable to follow multi step directions for way finding requiring repetition to recall directions. Drifting L/R throughout  Stairs Stairs: Yes Stairs assistance: Supervision Stair Management: Two rails;Alternating pattern;Forwards Number of Stairs: 2 General stair comments: supervision for safety  Wheelchair Mobility    Modified Rankin (Stroke Patients Only) Modified Rankin (Stroke Patients Only) Pre-Morbid Rankin Score: No symptoms Modified Rankin: Moderately severe disability     Balance Overall balance assessment: Mild deficits observed, not formally tested                                           Pertinent Vitals/Pain Pain Assessment: No/denies pain    Home Living Family/patient expects to be discharged to:: Private residence Living Arrangements: Alone Available Help at Discharge: Family;Available PRN/intermittently             Additional Comments: Patient contradictory during conversation about home setup. Stated she has 12 STE and lives in apartment then changed to house. Unable to answer PLOF and if patient has trouble getting words out at baseline.    Prior Function Level of Independence: Independent         Comments: patient able to state she does not use AD     Hand Dominance        Extremity/Trunk Assessment   Upper Extremity Assessment Upper Extremity Assessment: Defer to OT evaluation    Lower Extremity Assessment Lower Extremity Assessment: Generalized weakness    Cervical / Trunk Assessment Cervical / Trunk Assessment: Normal  Communication   Communication: Expressive difficulties  Cognition  Arousal/Alertness: Awake/alert Behavior During Therapy: Flat affect Overall Cognitive Status: No family/caregiver present to determine baseline cognitive functioning Area of Impairment:  Orientation;Attention;Memory;Following commands;Safety/judgement;Problem solving                 Orientation Level: Disoriented to;Situation Current Attention Level: Sustained Memory: Decreased short-term memory Following Commands: Follows one step commands inconsistently;Follows one step commands with increased time Safety/Judgement: Decreased awareness of safety;Decreased awareness of deficits   Problem Solving: Slow processing;Requires verbal cues General Comments: Unable to state why she was in hospital and perseverated on "someone called my sister". Patient following commands inconsistently and requires verbal and visual cues for direction. Slow processing noted. Decreased awareness of deficits and safety. Difficult to assess fully due to patient inconsistencies and no family present      General Comments      Exercises     Assessment/Plan    PT Assessment Patient needs continued PT services  PT Problem List Decreased strength;Decreased activity tolerance;Decreased balance;Decreased mobility;Decreased cognition;Decreased safety awareness       PT Treatment Interventions DME instruction;Gait training;Functional mobility training;Therapeutic activities;Therapeutic exercise;Stair training;Balance training;Patient/family education    PT Goals (Current goals can be found in the Care Plan section)  Acute Rehab PT Goals Patient Stated Goal: did not state PT Goal Formulation: With patient Time For Goal Achievement: 09/16/20 Potential to Achieve Goals: Good    Frequency Min 3X/week   Barriers to discharge        Co-evaluation               AM-PAC PT "6 Clicks" Mobility  Outcome Measure Help needed turning from your back to your side while in a flat bed without using bedrails?: None Help needed moving from lying on your back to sitting on the side of a flat bed without using bedrails?: None Help needed moving to and from a bed to a chair (including a  wheelchair)?: A Little Help needed standing up from a chair using your arms (e.g., wheelchair or bedside chair)?: A Little Help needed to walk in hospital room?: A Little Help needed climbing 3-5 steps with a railing? : A Little 6 Click Score: 20    End of Session Equipment Utilized During Treatment: Gait belt Activity Tolerance: Patient tolerated treatment well Patient left: in chair;with call bell/phone within reach;with chair alarm set Nurse Communication: Mobility status PT Visit Diagnosis: Unsteadiness on feet (R26.81);Muscle weakness (generalized) (M62.81)    Time: [id number] PT Time Calculation (min) (ACUTE ONLY): 22 min   Charges:   PT Evaluation $PT Eval Moderate Complexity: 1 Mod          Chandria Rookstool A. Gilford Rile PT, DPT Acute Rehabilitation Services Pager [phone] Office [phone]   Linna Hoff 09/02/2020, 9:29 AM

## 2020-09-02 NOTE — Progress Notes (Signed)
Inpatient Diabetes Program Recommendations  AACE/ADA: New Consensus Statement on Inpatient Glycemic Control (2015)  Target Ranges:  Prepandial:   less than 140 mg/dL      Peak postprandial:   less than 180 mg/dL (1-2 hours)      Critically ill patients:  140 - 180 mg/dL   Lab Results  Component Value Date   GLUCAP 224 (H) 09/02/2020   HGBA1C 9.1 (A) 06/23/2019   HGBA1C 9.1 06/23/2019   HGBA1C 9.1 (A) 06/23/2019   HGBA1C 9.1 (A) 06/23/2019    Review of Glycemic Control  Diabetes history: type 2 Outpatient Diabetes medications: Metformin 1000 mg BID, glucotrol 10 mg BID Current orders for Inpatient glycemic control: Levemir 10 units daily, Novolog SENSITIVE correction scale every 4 hours.  Inpatient Diabetes Program Recommendations:   Spoke with patient at the bedside. States that she was diagnosed "a long time ago" and states that she was on insulin at home, but the physician discontinued it due to low blood sugars. She could not remember how much she was on.   Patient lives alone, states that she does drink some sweet drinks such as juice and ginger ale. States that she still has a bottle of insulin in her refrigerator and is willing to take insulin again. Patient seems to be a bit confused about her medications. Encouraged to check blood sugars and take her prescribed medications. She needs to follow up with PCP.   Harvel Ricks RN BSN CDE Diabetes Coordinator Pager: [phone]  8am-5pm

## 2020-09-02 NOTE — Progress Notes (Addendum)
Patient arrived to [phone]. Patient noted alert, able to tell staff she is at the hospital, the year and her name, but unsure why she is here, she appears a little frustrated when she wasn't unable to answer some of the questions. Sh also voice that she needs her glasses to see. Pt denies any p[ai or discomfort. Patient is on insulin gtts, ENDO tool utilized for monitoring with Q1 CBG. Patient able to ambulate to BR with one assist. TELE applied and confirmed, Safety precautions and orders reviewed with patient. Will continue to monitor.

## 2020-09-02 NOTE — Evaluation (Signed)
Clinical/Bedside Swallow Evaluation Patient Details  Name: Jill Jefferson MRN: [id number] Date of Birth: 11-15-1960  Today's Date: 09/02/2020 Time: SLP Start Time (ACUTE ONLY): 0945 SLP Stop Time (ACUTE ONLY): 1020 SLP Time Calculation (min) (ACUTE ONLY): 35 min  Past Medical History:  Past Medical History:  Diagnosis Date   Diabetes ([place])    Edema    H. pylori infection 06/2019   H. pylori infection 06/2019   Hypertension    Proteinuria 11/2018   Past Surgical History: History reviewed. No pertinent surgical history. HPI:  60 y/o female presented to ED on 8/25 for confusion. CBG on arrival >600 (up to 821). 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 PMH: cataracts, edema, diabetes, HTN, H.pylori infection MRI pending; failed Yale; swallow evaluation ordered.  Assessment / Plan / Recommendation Clinical Impression  Pt seen for clinical swallowing evaluation with swallowing function appearing normal for oral/pharyngeal phase without overt s/s of aspiration present with various consistencies assessed including thin via cup/straw, puree and solids.  Adequate preparation, mastication and timing noted throughout evaluation.  Pt exhibits decreased sustained attention which may impact meal tolerance/consumption.  Recommend initiating a regular/thin liquid diet (heart healthy/carb modified) with f/u x1 for impact of decreased sustained attention during meal intake.  Pt required min verbal cues during consumption d/t decreased attention needs.  Pt will also be f/u for cognitive/linguistic needs.  Thank you for this consult. SLP Visit Diagnosis: Dysphagia, unspecified (R13.10)    Aspiration Risk  Mild aspiration risk    Diet Recommendation   Regular/thin liquids (heart healthy/carb modified)  Medication Administration: Whole meds with liquid    Other  Recommendations Oral Care Recommendations: Oral care BID;Patient independent with  oral care   Follow up Recommendations Other (comment) (TBD)      Frequency and Duration min 2x/week  1 week       Prognosis Prognosis for Safe Diet Advancement: Good      Swallow Study   General Date of Onset: 09/01/20 HPI: 60 y/o female presented to ED on 8/25 for confusion. CBG on arrival >600 (up to 821). 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 PMH: cataracts, edema, diabetes, HTN, H.pylori infection Type of Study: Bedside Swallow Evaluation Previous Swallow Assessment: Yale; failed initially; NPO Diet Prior to this Study: NPO Temperature Spikes Noted: Yes (low grade) Respiratory Status: Room air History of Recent Intubation: No Behavior/Cognition: Alert;Confused;Distractible;Requires cueing Oral Cavity Assessment: Within Functional Limits Oral Care Completed by SLP: Other (Comment) (Pt completed thoroughly prior to BSE) Oral Cavity - Dentition: Adequate natural dentition Vision: Functional for self-feeding Self-Feeding Abilities: Able to feed self;Needs set up Patient Positioning: Upright in bed Baseline Vocal Quality: Normal Volitional Cough: Strong Volitional Swallow: Able to elicit    Oral/Motor/Sensory Function Overall Oral Motor/Sensory Function: Within functional limits   Ice Chips Ice chips: Within functional limits Presentation: Spoon   Thin Liquid Thin Liquid: Within functional limits Presentation: Cup;Straw    Nectar Thick Nectar Thick Liquid: Not tested   Honey Thick Honey Thick Liquid: Not tested   Puree Puree: Within functional limits Presentation: Spoon   Solid     Solid: Within functional limits Presentation: Self Fed      Elvina Sidle, M.S., CCC-SLP 09/02/2020,12:07 PM

## 2020-09-02 NOTE — Progress Notes (Addendum)
Patient's CBG at 178, per Endo Tool to transition patient off insulin drip. MD notified.   0430 Orders received. Notified pharmacy for levemir

## 2020-09-02 NOTE — Progress Notes (Signed)
Levemir given at 0538. Per order to stop insulin gtt 2 house after admin of levemir, insulin gtt stopped now. CBG at 122, 1 unit given, repot given to oncoming RN.

## 2020-09-02 NOTE — Progress Notes (Addendum)
PROGRESS NOTE    Jill Jefferson  [id number] DOB: 07/28/1960 DOA: 09/01/2020 PCP: [name], FNP (Inactive)   Chief Complaint  Patient presents with   Altered Mental Status   Brief Narrative:  Jill Jefferson is Jill Jefferson 60 y.o. female with medical history significant of cataracts, edema, diabetes, hypertension, H. pylori presents with altered mental status.  Patient presented altered and oriented to year and self only.  Unable to fully participate in history.  Additional history was obtained with assistance of chart review and family.  Her sister states that her hairstylist called earlier today because the patient canceled her appointment I did not sound right on the phone.  Another family member went to evaluate the patient and noted that she was talking strange and that she was speaking unintelligibly in both [place] and Jill Jefferson.   For me she is intermittently aphasic able to answer some questions appropriately and follows most commands appropriately.  At times when trying to discuss her difficulty expressing herself she repeats "something going on, something going on".   Patient denies fevers, chills, chest pain, shortness of breath, abdominal pain, constipation, diarrhea, nausea, vomiting.    ED Course: Vital signs in the ED significant for blood pressure initially in the 200s now in the 120s with intervention.  Initially tachycardic but now not.  Lab work-up showed BMP with sodium 129 but this corrects to normal considering glucose of 829.  Chloride 93, creatinine elevated 2.52 from baseline of around 1.6.  Calcium 8.8.  LFTs pending.  CBC within normal limits.  Lactic acid 2.5 with repeat pending.  Serum osmolality elevated at 322.  BNP normal.  Respiratory panel for flu and COVID-negative.  Urine analysis showed small hemoglobin and protein.  Ethanol level and UDS are pending.  Chest x-ray showed no acute normalities.  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  Patient given Kareen Hitsman dose of amlodipine, hydrochlorothiazide and labetalol in the ED.  Also started on insulin drip given 2 L of IV fluids and 2 doses of 10 mEq IV potassium.  Assessment & Plan:   Principal Problem:   Hyperosmolar hyperglycemic state (HHS) ([place]) Active Problems:   Diabetes mellitus ([place])   Bilateral lower extremity edema   Focal neurological deficit   AKI (acute kidney injury) ([place])  Acute encephalopathy > Suspect to be metabolic encephalopathy in setting of HHS as this has been improving per EDP. > No focal deficits on exam, but issues with word finding and following commands.  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 > MRI pending to r/o stroke - Neurology consult - neurology overnight recommended MRI first, then to call them back -> given persistence of symptoms, requested formal neurology c/s - MRI pending - Allow for permissive HTN (systolic < [id number] and diastolic < 123456)  - ASA, Continue Statin  - Echocardiogram - with EF 65%, no RWMA, severe asymmetric LV hypertrophy, grade I diastolic dysfunction (no intracardiac source of embolism - consider TEE if clinically indicated - defer to neurology) - 1-39%, bilateral vertebral arteries  -  A1C pending, Lipid panel (LDL elevated) -> will increase to 40 mg  - Tele monitoring  - SLP/PT/OT - follow TSH, B12, folate, VBG, RPR - UA not c/w UTI, CXR without evidence of acute cardiopulm process   HHS > Patient presenting altered as above.  Found to have glucose in the 800s.  Serum osmolality 322.  Mildly elevated lactic acid  2.5 with Jill Jefferson creatinine of 2.5 elevated from baseline of 1.6. - he's off insulin gtt now - continue levemir, SSI - carb mod   AKI > Creatinine elevated to 2.52 from baseline around 1.6. - improving - UA with 100 mg/dl protein, follow renal US   Hypertension > BP initially elevated to [id number] systolic in the ED now improved to the 120s with  multiple interventions in the ED. - We will hold off on additional interventions while awaiting MRI brain to further evaluate her mentation.   Hypokalemia - replace and follow  DVT prophylaxis: lovenox Code Status: full  Family Communication: sister over phone Disposition:   Status is: Observation  The patient will require care spanning > 2 midnights and should be moved to inpatient because: Inpatient level of care appropriate due to severity of illness  Dispo: The patient is from: Home              Anticipated d/c is to: Home              Patient currently is not medically stable to d/c.   Difficult to place patient No       Consultants:  neurology  Procedures:  Carotid US Summary:  Right Carotid: Velocities in the right ICA are consistent with Jill Jefferson 1-39%  stenosis.   Left Carotid: Velocities in the left ICA are consistent with Jill Jefferson 1-39%  stenosis.   Vertebrals:  Bilateral vertebral arteries demonstrate antegrade flow.  Subclavians: Normal flow hemodynamics were seen in bilateral subclavian               arteries.   Echo IMPRESSIONS     1. Left ventricular ejection fraction, by estimation, is 65%. The left  ventricle has normal function. The left ventricle has no regional wall  motion abnormalities. There is severe asymmetric left ventricular  hypertrophy of the septal segment. Left  ventricular diastolic parameters are consistent with Grade I diastolic  dysfunction (impaired relaxation).   2. Right ventricular systolic function is normal. The right ventricular  size is normal. Tricuspid regurgitation signal is inadequate for assessing  PA pressure.   3. Jill Jefferson small pericardial effusion is present.   4. The mitral valve is normal in structure. No evidence of mitral valve  regurgitation. No evidence of mitral stenosis.   5. The aortic valve is tricuspid. Aortic valve regurgitation is not  visualized. No aortic stenosis is present.   6. The inferior vena cava is normal  in size with greater than 50%  respiratory variability, suggesting right atrial pressure of 3 mmHg.   Conclusion(s)/Recommendation(s): No intracardiac source of embolism  detected on this transthoracic study. Jill Jefferson transesophageal echocardiogram is  recommended to exclude cardiac source of embolism if clinically indicated  particularly in setting of suboptimal  apical window.   Antimicrobials:  Anti-infectives (From admission, onward)    None          Subjective: Denies pain Jill Jefferson  Objective: Vitals:   09/02/20 0321 09/02/20 0530 09/02/20 0730 09/02/20 1146  BP: (!) 161/84 (!) 158/83 (!) 162/78 (!) 166/89  Pulse: 75 71 72 74  Resp: '16 18 18   '$ Temp: 99 F (37.2 C) 98.1 F (36.7 C) 98.9 F (37.2 C) 97.9 F (36.6 C)  TempSrc: Oral Oral Oral Oral  SpO2: 99% 100% 100% 100%  Weight:      Height:        Intake/Output Summary (Last 24 hours) at 09/02/2020 1524 Last data filed at 09/02/2020 0426 Gross per 24  hour  Intake 1195.14 ml  Output --  Net 1195.14 ml   Filed Weights   09/01/20 2326  Weight: 65 kg    Examination:  General exam: Appears calm and comfortable  Respiratory system: Clear to auscultation. Respiratory effort normal. Cardiovascular system: RRR Gastrointestinal system: Abdomen is nondistended, soft and nontender Central nervous system: Alert and oriented.  Some issues with wordfinding, some difficulty following commands.  Strength symmetric to upper and lowers.   Extremities: no LEE Skin: No rashes, lesions or ulcers Psychiatry: Judgement and insight appear normal. Mood & affect appropriate.     Data Reviewed: I have personally reviewed following labs and imaging studies  CBC: Recent Labs  Lab 09/01/20 1450 09/01/20 1506  WBC 9.9  --   NEUTROABS 8.1*  --   HGB 12.8 13.6  HCT 38.3 40.0  MCV 80.6  --   PLT 296  --     Basic Metabolic Panel: Recent Labs  Lab 09/01/20 1458 09/01/20 1506 09/01/20 2252 09/02/20 0630 09/02/20 1330  NA 129*  124* 138 136 131*  K 3.6 3.9 2.8* 2.6* 3.4*  CL 93*  --  102 100 96*  CO2 27  --  '28 29 29  '$ GLUCOSE 821*  --  134* 133* 258*  BUN 20  --  '16 13 10  '$ CREATININE 2.52*  --  1.98* 1.89* 1.87*  CALCIUM 8.8*  --  8.9 8.7* 8.6*  MG  --   --   --  1.6*  --     GFR: Estimated Creatinine Clearance: 27.6 mL/min (Jill Jefferson) (by C-G formula based on SCr of 1.87 mg/dL (H)).  Liver Function Tests: Recent Labs  Lab 09/01/20 2252  AST 18  ALT 17  ALKPHOS 78  BILITOT 0.6  PROT 5.2*  ALBUMIN 2.6*    CBG: Recent Labs  Lab 09/02/20 0323 09/02/20 0423 09/02/20 0729 09/02/20 0806 09/02/20 1150  GLUCAP 152* 178* 122* 89 224*     Recent Results (from the past 240 hour(s))  Resp Panel by RT-PCR (Flu Rusti Arizmendi&B, Covid) Nasopharyngeal Swab     Status: None   Collection Time: 09/01/20  3:27 PM   Specimen: Nasopharyngeal Swab; Nasopharyngeal(NP) swabs in vial transport medium  Result Value Ref Range Status   SARS Coronavirus 2 by RT PCR NEGATIVE NEGATIVE Final    Comment: (NOTE) SARS-CoV-2 target nucleic acids are NOT DETECTED.  The SARS-CoV-2 RNA is generally detectable in upper respiratory specimens during the acute phase of infection. The lowest concentration of SARS-CoV-2 viral copies this assay can detect is 138 copies/mL. Jill Jefferson negative result does not preclude SARS-Cov-2 infection and should not be used as the sole basis for treatment or other patient management decisions. Jill Jefferson negative result may occur with  improper specimen collection/handling, submission of specimen other than nasopharyngeal swab, presence of viral mutation(s) within the areas targeted by this assay, and inadequate number of viral copies(<138 copies/mL). Jill Jefferson negative result must be combined with clinical observations, patient history, and epidemiological information. The expected result is Negative.  Fact Sheet for Patients:  EntrepreneurPulse.com.au  Fact Sheet for Healthcare Providers:   IncredibleEmployment.be  This test is no t yet approved or cleared by the [place] FDA and  has been authorized for detection and/or diagnosis of SARS-CoV-2 by FDA under an Emergency Use Authorization (EUA). This EUA will remain  in effect (meaning this test can be used) for the duration of the COVID-19 declaration under Section 564(b)(1) of the Act, 21 U.S.C.section 360bbb-3(b)(1), unless the authorization is terminated  or revoked sooner.       Influenza Charlynn Salih by PCR NEGATIVE NEGATIVE Final   Influenza B by PCR NEGATIVE NEGATIVE Final    Comment: (NOTE) The Xpert Xpress SARS-CoV-2/FLU/RSV plus assay is intended as an aid in the diagnosis of influenza from Nasopharyngeal swab specimens and should not be used as Lindwood Mogel sole basis for treatment. Nasal washings and aspirates are unacceptable for Xpert Xpress SARS-CoV-2/FLU/RSV testing.  Fact Sheet for Patients: EntrepreneurPulse.com.au  Fact Sheet for Healthcare Providers: IncredibleEmployment.be  This test is not yet approved or cleared by the [place] FDA and has been authorized for detection and/or diagnosis of SARS-CoV-2 by FDA under an Emergency Use Authorization (EUA). This EUA will remain in effect (meaning this test can be used) for the duration of the COVID-19 declaration under Section 564(b)(1) of the Act, 21 U.S.C. section 360bbb-3(b)(1), unless the authorization is terminated or revoked.  Performed at [facility], [place] [address] [place], [place] [address]          Radiology Studies: DG Chest 2 View  Result Date: 09/01/2020 CLINICAL DATA:  Altered mental status EXAM: CHEST - 2 VIEW COMPARISON:  None. FINDINGS: The cardiomediastinal silhouette is normal. There is no focal consolidation or pulmonary edema. There is no pleural effusion or pneumothorax. There is no acute osseous abnormality. IMPRESSION: No radiographic evidence of acute cardiopulmonary  process. Electronically Signed   By: Valetta Mole M.D.   On: 09/01/2020 15:40   DG Abd 1 View  Result Date: 09/02/2020 CLINICAL DATA:  60 year old female with constipation. EXAM: ABDOMEN - 1 VIEW COMPARISON:  None. FINDINGS: The bowel gas pattern is normal. No radio-opaque calculi or other significant radiographic abnormality are seen. IMPRESSION: Nonobstructive bowel gas pattern. Electronically Signed   By: Ruthann Cancer M.D.   On: 09/02/2020 12:53   CT Head Wo Contrast  Result Date: 09/01/2020 CLINICAL DATA:  Mental status change, unknown cause EXAM: CT HEAD WITHOUT CONTRAST TECHNIQUE: Contiguous axial images were obtained from the base of the skull through the vertex without intravenous contrast. COMPARISON:  None. FINDINGS: Brain: Encephalomalacia in the left occipital lobe, presumably old infarct. No acute intracranial abnormality. Specifically, no hemorrhage, hydrocephalus, mass lesion, acute infarction, or significant intracranial injury. Vascular: No hyperdense vessel or unexpected calcification. Skull: No acute calvarial abnormality. 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 No acute intracranial abnormality. Electronically Signed   By: Rolm Baptise M.D.   On: 09/01/2020 18:06   ECHOCARDIOGRAM COMPLETE  Result Date: 09/02/2020    ECHOCARDIOGRAM REPORT   Patient Name:   PENNELOPE FORAND Date of Exam: 09/02/2020 Medical Rec #:  [id number]       Height:       60.0 in Accession #:    [id number]      Weight:       143.3 lb Date of Birth:  [birth year]        BSA:          1.620 m Patient Age:    85 years        BP:           162/78 mmHg Patient Gender: F               HR:           77 bpm. Exam Location:  Inpatient Procedure: 2D Echo, Cardiac Doppler, Color Doppler and Intracardiac            Opacification Agent Indications:  Stroke I63.9  History:        Patient has no prior history of Echocardiogram examinations.                 Risk  Factors:Hypertension and Diabetes. Edema. Acute                 encephalopathy. Acute kidney injury.  Sonographer:    Darlina Sicilian RDCS Referring Phys: [id number] Candace Gallus MELVIN  Sonographer Comments: Technically difficult study due to poor echo windows and suboptimal apical window. IMPRESSIONS  1. Left ventricular ejection fraction, by estimation, is 65%. The left ventricle has normal function. The left ventricle has no regional wall motion abnormalities. 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 Left ventricular diastolic parameters are consistent with Grade I diastolic dysfunction (impaired relaxation).  2. Right ventricular systolic function is normal. The right ventricular size is normal. Tricuspid regurgitation signal is inadequate for assessing PA pressure.  3. Jill Jefferson small pericardial effusion is present.  4. The mitral valve is normal in structure. No evidence of mitral valve regurgitation. No evidence of mitral stenosis.  5. The aortic valve is tricuspid. Aortic valve regurgitation is not visualized. No aortic stenosis is present.  6. The inferior vena cava is normal in size with greater than 50% respiratory variability, suggesting right atrial pressure of 3 mmHg. Conclusion(s)/Recommendation(s): No intracardiac source of embolism detected on this transthoracic study. Electra Paladino transesophageal echocardiogram is recommended to exclude cardiac source of embolism if clinically indicated particularly in setting of suboptimal apical window. FINDINGS  Left Ventricle: Left ventricular ejection fraction, by estimation, is 65%. The left ventricle has normal function. The left ventricle has no regional wall motion abnormalities. Definity contrast agent was given IV to delineate the left ventricular endocardial borders. The left ventricular internal cavity size was normal in size. 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 Left ventricular diastolic  parameters are consistent with Grade I diastolic dysfunction (impaired relaxation). Right Ventricle: The right ventricular size is normal. No increase in right ventricular wall thickness. Right ventricular systolic function is normal. Tricuspid regurgitation signal is inadequate for assessing PA pressure. Left Atrium: Left atrial size was normal in size. Right Atrium: Right atrial size was normal in size. Pericardium: Jill Jefferson small pericardial effusion is present. Mitral Valve: The mitral valve is normal in structure. No evidence of mitral valve regurgitation. No evidence of mitral valve stenosis. Tricuspid Valve: The tricuspid valve is normal in structure. Tricuspid valve regurgitation is trivial. No evidence of tricuspid stenosis. Aortic Valve: The aortic valve is tricuspid. Aortic valve regurgitation is not visualized. No aortic stenosis is present. Pulmonic Valve: The pulmonic valve was normal in structure. Pulmonic valve regurgitation is trivial. No evidence of pulmonic stenosis. Aorta: The aortic root is normal in size and structure. Venous: The inferior vena cava is normal in size with greater than 50% respiratory variability, suggesting right atrial pressure of 3 mmHg. IAS/Shunts: No atrial level shunt detected by color flow Doppler.  LEFT VENTRICLE PLAX 2D LVIDd:         4.10 cm  Diastology LVIDs:         2.20 cm  LV e' medial:    4.54 cm/s LV PW:         0.90 cm  LV E/e' medial:  14.4 LV IVS:        1.50 cm  LV e' lateral:   5.48 cm/s LVOT diam:     1.90 cm  LV E/e'  lateral: 11.9 LV SV:         49 LV SV Index:   30 LVOT Area:     2.84 cm  RIGHT VENTRICLE TAPSE (M-mode): 1.8 cm LEFT ATRIUM             Index LA diam:        4.00 cm 2.47 cm/m LA Vol (A2C):   15.7 ml 9.69 ml/m LA Vol (A4C):   23.4 ml 14.44 ml/m LA Biplane Vol: 19.3 ml 11.91 ml/m  AORTIC VALVE LVOT Vmax:   94.20 cm/s LVOT Vmean:  61.300 cm/s LVOT VTI:    0.174 m  AORTA Ao Root diam: 3.10 cm Ao Asc diam:  2.80 cm MITRAL VALVE MV Area (PHT): 3.34  cm    SHUNTS MV Decel Time: 227 msec    Systemic VTI:  0.17 m MV E velocity: 65.45 cm/s  Systemic Diam: 1.90 cm MV Jill Martian velocity: 84.35 cm/s MV E/Griff Badley ratio:  0.78 Jill Kaiser MD Electronically signed by Jill Kaiser MD Signature Date/Time: 09/02/2020/12:02:50 PM    Final    VAS US CAROTID (at [facility] and [facility] only)  Result Date: 09/02/2020 Carotid Arterial Duplex Study Patient Name:  Jill Jefferson  Date of Exam:   09/02/2020 Medical Rec #: [id number]        Accession #:    [id number] Date of Birth: [birth year]         Patient Gender: F Patient Age:   69 years Exam Location:  [facility] Procedure:      VAS US CAROTID Referring Phys: Sheppard Coil MELVIN --------------------------------------------------------------------------------  Indications:       CVA. Risk Factors:      Hypertension, Diabetes. Comparison Study:  No prior studies. Performing Technologist: Darlin Coco RDMS, RVT  Examination Guidelines: Cruze Zingaro complete evaluation includes B-mode imaging, spectral Doppler, color Doppler, and power Doppler as needed of all accessible portions of each vessel. Bilateral testing is considered an integral part of Preston Weill complete examination. Limited examinations for reoccurring indications may be performed as noted.  Right Carotid Findings: +----------+--------+--------+--------+----------------------------+--------+           PSV cm/sEDV cm/sStenosisPlaque Description          Comments +----------+--------+--------+--------+----------------------------+--------+ CCA Prox  99      10                                                   +----------+--------+--------+--------+----------------------------+--------+ CCA Distal98      14                                                   +----------+--------+--------+--------+----------------------------+--------+ ICA Prox  60      14      1-39%   hyperechoic and heterogenous          +----------+--------+--------+--------+----------------------------+--------+ ICA Distal72      27                                                   +----------+--------+--------+--------+----------------------------+--------+ ECA       119  9                                                    +----------+--------+--------+--------+----------------------------+--------+ +----------+--------+-------+----------------+-------------------+           PSV cm/sEDV cmsDescribe        Arm Pressure (mmHG) +----------+--------+-------+----------------+-------------------+ Subclavian170            Multiphasic, WNL                    +----------+--------+-------+----------------+-------------------+ +---------+--------+--+--------+--+---------+ VertebralPSV cm/s78EDV cm/s35Antegrade +---------+--------+--+--------+--+---------+  Left Carotid Findings: +----------+--------+--------+--------+------------------+--------+           PSV cm/sEDV cm/sStenosisPlaque DescriptionComments +----------+--------+--------+--------+------------------+--------+ CCA Prox  129     15                                         +----------+--------+--------+--------+------------------+--------+ CCA Distal86      13                                         +----------+--------+--------+--------+------------------+--------+ ICA Prox  84      29      1-39%   heterogenous               +----------+--------+--------+--------+------------------+--------+ ICA Distal71      21                                         +----------+--------+--------+--------+------------------+--------+ ECA       72      12                                         +----------+--------+--------+--------+------------------+--------+ +----------+--------+--------+----------------+-------------------+           PSV cm/sEDV cm/sDescribe        Arm Pressure (mmHG)  +----------+--------+--------+----------------+-------------------+ [id number]             Multiphasic, WNL                    +----------+--------+--------+----------------+-------------------+ +---------+--------+--+--------+--+---------+ VertebralPSV cm/s72EDV cm/s15Antegrade +---------+--------+--+--------+--+---------+   Summary: Right Carotid: Velocities in the right ICA are consistent with Aurie Harroun 1-39% stenosis. Left Carotid: Velocities in the left ICA are consistent with Romie Tay 1-39% stenosis. Vertebrals:  Bilateral vertebral arteries demonstrate antegrade flow. Subclavians: Normal flow hemodynamics were seen in bilateral subclavian              arteries. *See table(s) above for measurements and observations.  Electronically signed by Antony Contras MD on 09/02/2020 at 3:15:47 PM.    Final         Scheduled Meds:  atorvastatin  10 mg Oral Daily   enoxaparin (LOVENOX) injection  30 mg Subcutaneous Q24H   insulin aspart  0-9 Units Subcutaneous Q4H   insulin detemir  10 Units Subcutaneous Daily   Continuous Infusions:  lactated ringers 125 mL/hr at 09/02/20 1350   lactated ringers 125 mL/hr at 09/02/20 1352     LOS: 0 days    Time  spent: over 30 min    Fayrene Helper, MD [facility]   To contact the attending provider between 7A-7P or the covering provider during after hours 7P-7A, please log into the web site [email or web address] and access using universal [place] password for that web site. If you do not have the password, please call the hospital operator.  09/02/2020, 3:24 PM

## 2020-09-02 NOTE — Evaluation (Signed)
Speech Language Pathology Evaluation Patient Details Name: Jill Jefferson MRN: [id number] DOB: December 21, 1960 Today's Date: 09/02/2020 Time: [id number] SLP Time Calculation (min) (ACUTE ONLY): 35 min  Problem List:  Patient Active Problem List   Diagnosis Date Noted   Hyperosmolar hyperglycemic state (HHS) ([place]) 09/01/2020   Focal neurological deficit 09/01/2020   AKI (acute kidney injury) ([place]) 09/01/2020   Hemoglobin A1c less than 7.0% 11/26/2018   Hyperglycemia 11/26/2018   Proteinuria 11/26/2018   Joint pain in fingers of both hands 08/03/2018   Bilateral lower extremity edema 05/03/2018   Diabetes mellitus ([place]) 01/31/2018   Age-related cataract of both eyes 01/31/2018   Elevated glucose 01/31/2018   Past Medical History:  Past Medical History:  Diagnosis Date   Diabetes ([place])    Edema    H. pylori infection 06/2019   H. pylori infection 06/2019   Hypertension    Proteinuria 11/2018   Past Surgical History: History reviewed. No pertinent surgical history. HPI:  60 y/o female presented to ED on 8/25 for confusion. CBG on arrival >600 (up to 821). 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 PMH: cataracts, edema, diabetes, HTN, H.pylori infection MRI pending; SLE generated  Assessment / Plan / Recommendation Clinical Impression  Pt seen for cognitive/linguistic evaluation with SLUMS ([place] Mental Status Examination) with a score obtained of 13/30 with deficits noted in the areas of attention/memory and auditory comprehension.  Pt with aphasic errors as well which were difficult to decipher if pt's attention impacting recall or a combination of aphasia/cog deficits as pt would often state "It's too much" or "too much going on" when asked to complete certain cognitive tasks such as recall from short paragraph, repeating digits backwards or recalling items within a category.  Clock formation difficult as well.   Oriented to self, place and date, but not situation.  Speech was deliberate, but precise/intelligible within conversation without dysarthria present.  ST will f/u while in acute setting for cog/linguistic deficits.  Thank you for this consult.    SLP Assessment  SLP Recommendation/Assessment: Patient needs continued Speech Language Pathology Services SLP Visit Diagnosis: Attention and concentration deficit;Cognitive communication deficit (R41.841) Attention and concentration deficit following: Other cerebrovascular disease    Follow Up Recommendations  Other (comment) (TBD)    Frequency and Duration min 2x/week  1 week      SLP Evaluation Cognition  Overall Cognitive Status: No family/caregiver present to determine baseline cognitive functioning Arousal/Alertness: Awake/alert Orientation Level: Oriented to person;Oriented to place;Disoriented to situation;Oriented to time Year: 2022 Month: August Day of Week: Correct Attention: Sustained Sustained Attention: Impaired Sustained Attention Impairment: Verbal basic;Functional basic Memory: Impaired Memory Impairment: Decreased recall of new information;Decreased short term memory;Retrieval deficit Decreased Short Term Memory: Verbal basic;Functional basic Immediate Memory Recall: Sock;Blue;Bed Memory Recall Sock: Without Cue Memory Recall Blue: With Cue Memory Recall Bed: Not able to recall Awareness: Impaired Awareness Impairment: Other (comment) (intermittent difficulty with awareness) Behaviors: Perseveration Safety/Judgment: Other (comment) (DTA) Comments: Attention impacting all areas; potential aphasia       Comprehension  Auditory Comprehension Overall Auditory Comprehension: Impaired Yes/No Questions: Within Functional Limits Commands: Impaired Multistep Basic Commands: 25-49% accurate Conversation: Simple Interfering Components: Working Marine scientist;Attention EffectiveTechniques: Extra processing time;Repetition Visual  Recognition/Discrimination Discrimination: Within Function Limits Reading Comprehension Reading Status: Within funtional limits (with environmental signs only)    Expression Expression Primary Mode of Expression: Verbal Verbal Expression Overall Verbal Expression: Impaired Level of Generative/Spontaneous Verbalization: Sentence Repetition: No impairment  Naming: Impairment Responsive: 76-100% accurate Confrontation: Within functional limits Convergent: 50-74% accurate Divergent: 50-74% accurate Verbal Errors: Perseveration;Semantic paraphasias Pragmatics: Unable to assess Interfering Components: Attention Effective Techniques: Semantic cues;Sentence completion Non-Verbal Means of Communication: Not applicable Written Expression Dominant Hand: Right Written Expression: Not tested   Oral / Motor  Oral Motor/Sensory Function Overall Oral Motor/Sensory Function: Within functional limits Motor Speech Overall Motor Speech: Appears within functional limits for tasks assessed Respiration: Within functional limits Phonation: Normal Resonance: Within functional limits Articulation: Within functional limitis Intelligibility: Intelligible Motor Planning: Witnin functional limits                       Elvina Sidle, M.S., CCC-SLP 09/02/2020, 12:26 PM

## 2020-09-02 NOTE — Consult Note (Signed)
NEURO HOSPITALIST CONSULT NOTE   Requestig physician: Dr. Florene Glen  Reason for Consult: Aphasia  History obtained from:   Patient and Chart     HPI:                                                                                                                                          Jill Jefferson is an 60 y.o. female with a PMHx of diabetes and HTN who presented with AMS. She was scheduled to go to her hairdresser but canceled the appointment over the phone; the hairdresser noted that the patient did not sound right. Another family member then went to see her and noticed that she was speaking unintelligibly in both [place] and [place]. She was noted by Hospitalist to be intermittently aphasic, following most but not all commands appropriately and also answering some questions inappropriately.   Her glucose level was 829 in the ED. Cr was elevated at 2.52 relative to her baseline of 1.6.   CT head showed no acute abnormality. An old left occipital infarction was noted. She was started on an insulin drip in the ED and administered IVF in addition to potassium.   Her presentation was suspected to be due to [facility] with associated metabolic encephalopathy. Her confusion was improving per progress note by Hospitalist today. MRI brain revealed no acute stroke.   Past Medical History:  Diagnosis Date   Diabetes ([place])    Edema    H. pylori infection 06/2019   H. pylori infection 06/2019   Hypertension    Proteinuria 11/2018    History reviewed. No pertinent surgical history.  Family History  Problem Relation Age of Onset   Diabetes Mother    Hypertension Mother              Social History:  reports that she has never smoked. She has never used smokeless tobacco. She reports that she does not drink alcohol and does not use drugs.  No Known Allergies  MEDICATIONS:                                                                                                                      Prior to Admission:  Medications Prior to Admission  Medication Sig Dispense Refill Last Dose  acetaminophen (TYLENOL) 500 MG tablet Take 1 tablet (500 mg total) by mouth 2 (two) times a day. 60 tablet 3    amLODipine (NORVASC) 10 MG tablet TAKE 1 TABLET (10 MG TOTAL) BY MOUTH DAILY. 30 tablet 11    amoxicillin (AMOXIL) 500 MG tablet Take 2 capsules (1,000 mg= Total), by mouth, 2 times a day X 14 days. 56 tablet 0    atorvastatin (LIPITOR) 10 MG tablet TAKE 1 TABLET (10 MG TOTAL) BY MOUTH DAILY. 30 tablet 11    blood glucose meter kit and supplies Dispense based on patient and insurance preference. Use up to four times daily as directed. (FOR ICD-10 E10.9, E11.9). 1 each 0    calcium-vitamin D (OSCAL WITH D) 250-125 MG-UNIT tablet Take 1 tablet by mouth daily.      cholecalciferol (VITAMIN D3) 25 MCG (1000 UT) tablet Take 1,000 Units by mouth daily.      furosemide (LASIX) 20 MG tablet TAKE 1 TABLET BY MOUTH DAILY. (Patient not taking: Reported on 06/23/2019) 30 tablet 3    glipiZIDE (GLUCOTROL) 10 MG tablet TAKE 1 TABLET (10 MG TOTAL) BY MOUTH 2 (TWO) TIMES DAILY BEFORE A MEAL. 60 tablet 11    hydrochlorothiazide (HYDRODIURIL) 25 MG tablet TAKE 1 TABLET (25 MG TOTAL) BY MOUTH DAILY. 30 tablet 11    insulin glargine (LANTUS) 100 UNIT/ML injection Inject 0.2 mLs (20 Units total) into the skin at bedtime. (Patient not taking: Reported on 02/25/2019) 10 mL 11    lisinopril (ZESTRIL) 10 MG tablet TAKE 1 TABLET (10 MG TOTAL) BY MOUTH DAILY. 30 tablet 3    magnesium 30 MG tablet Take 30 mg by mouth 2 (two) times daily.      metFORMIN (GLUCOPHAGE) 1000 MG tablet TAKE 1 TABLET (1,000 MG TOTAL) BY MOUTH 2 (TWO) TIMES DAILY WITH A MEAL. 60 tablet 6    omeprazole (PRILOSEC) 40 MG capsule Take 1 capsule (40 mg total) by mouth daily for 14 days. 14 capsule 0    potassium chloride (K-DUR,KLOR-CON) 10 MEQ tablet Take 10 mEq by mouth once.      vitamin E 100 UNIT capsule Take 200 Units by mouth daily.  (Patient not taking: Reported on 06/23/2019)      Scheduled:  aspirin EC  81 mg Oral Daily   [START ON 09/03/2020] atorvastatin  40 mg Oral Daily   enoxaparin (LOVENOX) injection  30 mg Subcutaneous Q24H   insulin aspart  0-5 Units Subcutaneous QHS   insulin aspart  0-9 Units Subcutaneous TID WC   insulin aspart  3 Units Subcutaneous TID WC   insulin detemir  10 Units Subcutaneous Daily   [START ON 09/09/2020] thiamine injection  100 mg Intravenous Daily   Continuous:  lactated ringers 125 mL/hr at 09/02/20 1352   thiamine injection 500 mg (09/02/20 2256)   Followed by   Derrill Memo ON 09/04/2020] thiamine injection       ROS:  As per HPI. The patient is otherwise poorly cooperative with interview.    Blood pressure (!) 168/91, pulse 80, temperature 99 F (37.2 C), temperature source Oral, resp. rate 17, height 5' (1.524 m), weight 65 kg, SpO2 100 %.   General Examination:                                                                                                       Physical Exam  HEENT-  [place]/AT   Lungs- Respirations unlabored Extremities- No edema   Neurological Examination Mental Status: Awake with mildly decreased level of alertness. Speech is fluent but with decreased information content. No dysarthria. Will follow all commands, but some require additional explanation and/or multiple repetitions. Can name a pen and thumb, but not a pinky finger. Made two errors with 3 step directional command. Repetition impaired. Orients correctly to city, state, year, month and day. Mildly increased latencies of verbal responses. Overall appears mildly confused.  Cranial Nerves: II: PERRL. Question of decreased visual acuity in left eye with possible visual field constriction in the context of visible cataract. Visual fields OD are intact.   III,IV, VI: No  ptosis. EOMI. No nystagmus.  V: Temp sensation equal bilaterally VII: Smile symmetric VIII: Hearing intact to voice IX,X: No hypophonia XI: Head is midline XII: Midline tongue extension Motor: Right : Upper extremity   5/5    Left:     Upper extremity   5/5  Lower extremity   5/5     Lower extremity   5/5 Sensory: Temp sensation intact x 4.   Deep Tendon Reflexes: 2+ and symmetric throughout Cerebellar: No ataxia with FNF bilaterally  Gait: Deferred   Lab Results: Basic Metabolic Panel: Recent Labs  Lab 09/01/20 1458 09/01/20 1506 09/01/20 2252 09/02/20 0630 09/02/20 1330  NA 129* 124* 138 136 131*  K 3.6 3.9 2.8* 2.6* 3.4*  CL 93*  --  102 100 96*  CO2 27  --  '28 29 29  ' GLUCOSE 821*  --  134* 133* 258*  BUN 20  --  '16 13 10  ' CREATININE 2.52*  --  1.98* 1.89* 1.87*  CALCIUM 8.8*  --  8.9 8.7* 8.6*  MG  --   --   --  1.6*  --     CBC: Recent Labs  Lab 09/01/20 1450 09/01/20 1506  WBC 9.9  --   NEUTROABS 8.1*  --   HGB 12.8 13.6  HCT 38.3 40.0  MCV 80.6  --   PLT 296  --     Cardiac Enzymes: No results for input(s): CKTOTAL, CKMB, CKMBINDEX, TROPONINI in the last 168 hours.  Lipid Panel: Recent Labs  Lab 09/02/20 0630  CHOL 277*  TRIG 104  HDL 42  CHOLHDL 6.6  VLDL 21  LDLCALC 214*    Imaging: DG Chest 2 View  Result Date: 09/01/2020 CLINICAL DATA:  Altered mental status EXAM: CHEST - 2 VIEW COMPARISON:  None. FINDINGS: The cardiomediastinal silhouette is normal. There is no focal consolidation or pulmonary edema. There is no pleural effusion or pneumothorax.  There is no acute osseous abnormality. IMPRESSION: No radiographic evidence of acute cardiopulmonary process. Electronically Signed   By: Valetta Mole M.D.   On: 09/01/2020 15:40   DG Abd 1 View  Result Date: 09/02/2020 CLINICAL DATA:  60 year old female with constipation. EXAM: ABDOMEN - 1 VIEW COMPARISON:  None. FINDINGS: The bowel gas pattern is normal. No radio-opaque calculi or other  significant radiographic abnormality are seen. IMPRESSION: Nonobstructive bowel gas pattern. Electronically Signed   By: Ruthann Cancer M.D.   On: 09/02/2020 12:53   CT Head Wo Contrast  Result Date: 09/01/2020 CLINICAL DATA:  Mental status change, unknown cause EXAM: CT HEAD WITHOUT CONTRAST TECHNIQUE: Contiguous axial images were obtained from the base of the skull through the vertex without intravenous contrast. COMPARISON:  None. FINDINGS: Brain: Encephalomalacia in the left occipital lobe, presumably old infarct. No acute intracranial abnormality. Specifically, no hemorrhage, hydrocephalus, mass lesion, acute infarction, or significant intracranial injury. Vascular: No hyperdense vessel or unexpected calcification. Skull: No acute calvarial abnormality. 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 No acute intracranial abnormality. Electronically Signed   By: Rolm Baptise M.D.   On: 09/01/2020 18:06   MR BRAIN WO CONTRAST  Result Date: 09/02/2020 CLINICAL DATA:  Acute neuro deficit.  Altered mental status EXAM: MRI HEAD WITHOUT CONTRAST TECHNIQUE: Multiplanar, multiecho pulse sequences of the brain and surrounding structures were obtained without intravenous contrast. COMPARISON:  CT head 09/01/2020 FINDINGS: Brain: Ventricle size and cerebral volume normal. Enlarged sella filled with CSF. Probable empty sella which is likely an incidental finding. Chronic encephalomalacia left occipital lobe with overlying craniotomy. No mass-effect or edema. Negative for acute infarct chronic blood products in the left occipital encephalomalacia. Vascular: Normal arterial flow voids Skull and upper cervical spine: Left occipital craniotomy Sinuses/Orbits: Mucosal edema paranasal sinuses. Right cataract extraction Other: None IMPRESSION: No acute abnormality Left occipital craniotomy with underlying chronic encephalomalacia. Electronically Signed   By:  Franchot Gallo M.D.   On: 09/02/2020 15:32   ECHOCARDIOGRAM COMPLETE  Result Date: 09/02/2020    ECHOCARDIOGRAM REPORT   Patient Name:   Jill Jefferson Date of Exam: 09/02/2020 Medical Rec #:  [id number]       Height:       60.0 in Accession #:    [id number]      Weight:       143.3 lb Date of Birth:  1960-11-09        BSA:          1.620 m Patient Age:    58 years        BP:           162/78 mmHg Patient Gender: F               HR:           77 bpm. Exam Location:  Inpatient Procedure: 2D Echo, Cardiac Doppler, Color Doppler and Intracardiac            Opacification Agent Indications:    Stroke I63.9  History:        Patient has no prior history of Echocardiogram examinations.                 Risk Factors:Hypertension and Diabetes. Edema. Acute                 encephalopathy. Acute kidney injury.  Sonographer:    Darlina Sicilian RDCS Referring Phys: [id number] [place]  Sonographer Comments: Technically  difficult study due to poor echo windows and suboptimal apical window. IMPRESSIONS  1. Left ventricular ejection fraction, by estimation, is 65%. The left ventricle has normal function. The left ventricle has no regional wall motion abnormalities. 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 Left ventricular diastolic parameters are consistent with Grade I diastolic dysfunction (impaired relaxation).  2. Right ventricular systolic function is normal. The right ventricular size is normal. Tricuspid regurgitation signal is inadequate for assessing PA pressure.  3. A small pericardial effusion is present.  4. The mitral valve is normal in structure. No evidence of mitral valve regurgitation. No evidence of mitral stenosis.  5. The aortic valve is tricuspid. Aortic valve regurgitation is not visualized. No aortic stenosis is present.  6. The inferior vena cava is normal in size with greater than 50% respiratory variability, suggesting right atrial pressure of 3 mmHg.  Conclusion(s)/Recommendation(s): No intracardiac source of embolism detected on this transthoracic study. A transesophageal echocardiogram is recommended to exclude cardiac source of embolism if clinically indicated particularly in setting of suboptimal apical window. FINDINGS  Left Ventricle: Left ventricular ejection fraction, by estimation, is 65%. The left ventricle has normal function. The left ventricle has no regional wall motion abnormalities. Definity contrast agent was given IV to delineate the left ventricular endocardial borders. The left ventricular internal cavity size was normal in size. 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 Left ventricular diastolic parameters are consistent with Grade I diastolic dysfunction (impaired relaxation). Right Ventricle: The right ventricular size is normal. No increase in right ventricular wall thickness. Right ventricular systolic function is normal. Tricuspid regurgitation signal is inadequate for assessing PA pressure. Left Atrium: Left atrial size was normal in size. Right Atrium: Right atrial size was normal in size. Pericardium: A small pericardial effusion is present. Mitral Valve: The mitral valve is normal in structure. No evidence of mitral valve regurgitation. No evidence of mitral valve stenosis. Tricuspid Valve: The tricuspid valve is normal in structure. Tricuspid valve regurgitation is trivial. No evidence of tricuspid stenosis. Aortic Valve: The aortic valve is tricuspid. Aortic valve regurgitation is not visualized. No aortic stenosis is present. Pulmonic Valve: The pulmonic valve was normal in structure. Pulmonic valve regurgitation is trivial. No evidence of pulmonic stenosis. Aorta: The aortic root is normal in size and structure. Venous: The inferior vena cava is normal in size with greater than 50% respiratory variability, suggesting right atrial pressure of 3 mmHg. IAS/Shunts: No atrial level shunt detected  by color flow Doppler.  LEFT VENTRICLE PLAX 2D LVIDd:         4.10 cm  Diastology LVIDs:         2.20 cm  LV e' medial:    4.54 cm/s LV PW:         0.90 cm  LV E/e' medial:  14.4 LV IVS:        1.50 cm  LV e' lateral:   5.48 cm/s LVOT diam:     1.90 cm  LV E/e' lateral: 11.9 LV SV:         49 LV SV Index:   30 LVOT Area:     2.84 cm  RIGHT VENTRICLE TAPSE (M-mode): 1.8 cm LEFT ATRIUM             Index LA diam:        4.00 cm 2.47 cm/m LA Vol (A2C):   15.7 ml 9.69 ml/m LA Vol (A4C):   23.4 ml 14.44  ml/m LA Biplane Vol: 19.3 ml 11.91 ml/m  AORTIC VALVE LVOT Vmax:   94.20 cm/s LVOT Vmean:  61.300 cm/s LVOT VTI:    0.174 m  AORTA Ao Root diam: 3.10 cm Ao Asc diam:  2.80 cm MITRAL VALVE MV Area (PHT): 3.34 cm    SHUNTS MV Decel Time: 227 msec    Systemic VTI:  0.17 m MV E velocity: 65.45 cm/s  Systemic Diam: 1.90 cm MV A velocity: 84.35 cm/s MV E/A ratio:  0.78 Cherlynn Kaiser MD Electronically signed by Cherlynn Kaiser MD Signature Date/Time: 09/02/2020/12:02:50 PM    Final    VAS US CAROTID (at [facility] and [facility] only)  Result Date: 09/02/2020 Carotid Arterial Duplex Study Patient Name:  JEWELINE REIF  Date of Exam:   09/02/2020 Medical Rec #: [id number]        Accession #:    [id number] Date of Birth: [birth year]         Patient Gender: F Patient Age:   55 years Exam Location:  [facility] Procedure:      VAS US CAROTID Referring Phys: Sheppard Coil MELVIN --------------------------------------------------------------------------------  Indications:       CVA. Risk Factors:      Hypertension, Diabetes. Comparison Study:  No prior studies. Performing Technologist: Darlin Coco RDMS, RVT  Examination Guidelines: A complete evaluation includes B-mode imaging, spectral Doppler, color Doppler, and power Doppler as needed of all accessible portions of each vessel. Bilateral testing is considered an integral part of a complete examination. Limited examinations for reoccurring indications may be performed as noted.  Right  Carotid Findings: +----------+--------+--------+--------+----------------------------+--------+           PSV cm/sEDV cm/sStenosisPlaque Description          Comments +----------+--------+--------+--------+----------------------------+--------+ CCA Prox  99      10                                                   +----------+--------+--------+--------+----------------------------+--------+ CCA Distal98      14                                                   +----------+--------+--------+--------+----------------------------+--------+ ICA Prox  60      14      1-39%   hyperechoic and heterogenous         +----------+--------+--------+--------+----------------------------+--------+ ICA Distal72      27                                                   +----------+--------+--------+--------+----------------------------+--------+ ECA       119     9                                                    +----------+--------+--------+--------+----------------------------+--------+ +----------+--------+-------+----------------+-------------------+           PSV cm/sEDV cmsDescribe        Arm Pressure (mmHG) +----------+--------+-------+----------------+-------------------+ Subclavian170  Multiphasic, WNL                    +----------+--------+-------+----------------+-------------------+ +---------+--------+--+--------+--+---------+ VertebralPSV cm/s78EDV cm/s35Antegrade +---------+--------+--+--------+--+---------+  Left Carotid Findings: +----------+--------+--------+--------+------------------+--------+           PSV cm/sEDV cm/sStenosisPlaque DescriptionComments +----------+--------+--------+--------+------------------+--------+ CCA Prox  129     15                                         +----------+--------+--------+--------+------------------+--------+ CCA Distal86      13                                          +----------+--------+--------+--------+------------------+--------+ ICA Prox  84      29      1-39%   heterogenous               +----------+--------+--------+--------+------------------+--------+ ICA Distal71      21                                         +----------+--------+--------+--------+------------------+--------+ ECA       72      12                                         +----------+--------+--------+--------+------------------+--------+ +----------+--------+--------+----------------+-------------------+           PSV cm/sEDV cm/sDescribe        Arm Pressure (mmHG) +----------+--------+--------+----------------+-------------------+ [id number]             Multiphasic, WNL                    +----------+--------+--------+----------------+-------------------+ +---------+--------+--+--------+--+---------+ VertebralPSV cm/s72EDV cm/s15Antegrade +---------+--------+--+--------+--+---------+   Summary: Right Carotid: Velocities in the right ICA are consistent with a 1-39% stenosis. Left Carotid: Velocities in the left ICA are consistent with a 1-39% stenosis. Vertebrals:  Bilateral vertebral arteries demonstrate antegrade flow. Subclavians: Normal flow hemodynamics were seen in bilateral subclavian              arteries. *See table(s) above for measurements and observations.  Electronically signed by Antony Contras MD on 09/02/2020 at 3:15:47 PM.    Final      Assessment: 60 year old female presenting with aphasia and confusion in the context of HHS with a blood glucose level of 829.  1. Exam reveals mildly decreased level of alertness. Speech is fluent but with decreased information content. Will follow all commands, but some require additional explanation and/or multiple repetitions. Mild naming deficit and made two errors with 3 step directional command. Repetition also impaired. Orients correctly to city, state, year, month and day. Mildly increased latencies of  verbal responses. Overall appears mildly confused.  2. MRI brain reveals chronic encephalomalacia left occipital lobe with overlying craniotomy. No mass-effect or edema. No acute abnormality.  3. Carotid ultrasound with 1-39% ICA stenoses bilaterally. Bilateral vertebral arteries demonstrate antegrade flow. 4. TTE: Left ventricular ejection fraction, by estimation, is 65%. A small pericardial effusion is present.  No intracardiac source of embolism detected on this transthoracic study.  5. Given no acute stroke on MRI, other etiologies for her presentation such  as subclinical seizure (has a chronic left occipital lobe lesion on imaging) triggered by her hyperglycemia, versus metabolic encephalopathy secondary to HHS and other possible metabolic causes should be considered.  6. AST, ALT, TSH normal. B12 normal.   Recommendations: 1. EEG (ordered) 2. Repeat Speech Therapy evaluation.  3. MRA head to complete stroke work up (ordered).  4. Continue ASA 5. Ammonia and RPR (ordered) 6. Agree with empiric high dose thiamine. 500 mg TID x 3 days.      Electronically signed: Dr. Kerney Elbe 09/02/2020, 11:52 PM

## 2020-09-02 NOTE — Evaluation (Signed)
Occupational Therapy Evaluation Patient Details Name: Jill Jefferson MRN: [id number] DOB: March 18, 1960 Today's Date: 09/02/2020    History of Present Illness 60 y/o female presented to ED on 8/25 for confusion. CBG on arrival >600 (up to 821). 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 PMH: cataracts, edema, diabetes, HTN, H.pylori infection   Clinical Impression   Patient admitted for the above diagnosis.  PTA she apparently lives alone, and was most likely independent, but prior level of function, and home details were difficult to obtain given the patient's confusion.  Barriers are listed below.  Currently she is needing supervision for all mobility and self care for safety.  She has difficulty attending to tasks and directions, and often loses her train of thought.  OT to follow in the acute setting.  Currently she should be able to transition home once medically cleared, but will need 24 hour supervision.      Follow Up Recommendations  Supervision/Assistance - 24 hour    Equipment Recommendations  None recommended by OT    Recommendations for Other Services       Precautions / Restrictions Precautions Precautions: Fall Restrictions Weight Bearing Restrictions: No      Mobility Bed Mobility Overal bed mobility: Modified Independent               Patient Response: Cooperative;Flat affect  Transfers Overall transfer level: Needs assistance Equipment used: None Transfers: Sit to/from Stand Sit to Stand: Supervision         General transfer comment: supervision for safety    Balance Overall balance assessment: Mild deficits observed, not formally tested                                         ADL either performed or assessed with clinical judgement   ADL Overall ADL's : Needs assistance/impaired     Grooming: Wash/dry hands;Supervision/safety;Standing           Upper Body  Dressing : Supervision/safety;Sitting   Lower Body Dressing: Supervision/safety;Sit to/from stand   Toilet Transfer: Supervision/safety   Toileting- Water quality scientist and Hygiene: Independent;Sitting/lateral lean       Functional mobility during ADLs: Supervision/safety       Vision Patient Visual Report: No change from baseline       Perception     Praxis      Pertinent Vitals/Pain Pain Assessment: No/denies pain     Hand Dominance Right   Extremity/Trunk Assessment Upper Extremity Assessment Upper Extremity Assessment: Overall WFL for tasks assessed   Lower Extremity Assessment Lower Extremity Assessment: Defer to PT evaluation   Cervical / Trunk Assessment Cervical / Trunk Assessment: Normal   Communication Communication Communication: Expressive difficulties   Cognition Arousal/Alertness: Awake/alert Behavior During Therapy: Flat affect Overall Cognitive Status: No family/caregiver present to determine baseline cognitive functioning Area of Impairment: Orientation;Attention;Memory;Following commands;Safety/judgement;Problem solving                 Orientation Level: Disoriented to;Situation Current Attention Level: Sustained Memory: Decreased short-term memory Following Commands: Follows one step commands inconsistently;Follows one step commands with increased time Safety/Judgement: Decreased awareness of safety;Decreased awareness of deficits   Problem Solving: Slow processing;Requires verbal cues General Comments: Unable to state why she was in hospital and perseverated on "someone called my sister". Patient following commands inconsistently and requires verbal and visual cues for direction. Slow processing  noted. Decreased awareness of deficits and safety. Difficult to assess fully due to patient inconsistencies and no family present   General [place] expects to be discharged to:: Private  residence Living Arrangements: Alone Available Help at Discharge: Family;Available PRN/intermittently Type of Home: House Home Access: Stairs to enter     Home Layout: Multi-level     Bathroom Shower/Tub: Tub/shower unit;Walk-in shower             Additional Comments: Inconsistent with home details..      Prior Functioning/Environment Level of Independence: Independent        Comments: patient able to state she does not use AD        OT Problem List: Impaired balance (sitting and/or standing);Decreased safety awareness;Decreased cognition      OT Treatment/Interventions: Self-care/ADL training;Balance training;Patient/family education;Cognitive remediation/compensation;Therapeutic activities    OT Goals(Current goals can be found in the care plan section) Acute Rehab OT Goals Patient Stated Goal: None stated OT Goal Formulation: Patient unable to participate in goal setting Time For Goal Achievement: 09/16/20 Potential to Achieve Goals: Good ADL Goals Pt Will Perform Grooming: Independently;standing;sitting Pt Will Perform Lower Body Bathing: Independently;sit to/from stand Pt Will Perform Lower Body Dressing: Independently;sit to/from stand Pt Will Transfer to Toilet: Independently;ambulating;regular height toilet Additional ADL Goal #1: Patient will attend to ADL task for greater than 5 min, and sequence during ADL task without any cues.  OT Frequency: Min 2X/week   Barriers to D/C:    none noted       Co-evaluation              AM-PAC OT "6 Clicks" Daily Activity     Outcome Measure Help from another person eating meals?: None Help from another person taking care of personal grooming?: None Help from another person toileting, which includes using toliet, bedpan, or urinal?: A Little Help from another person bathing (including washing, rinsing, drying)?: A Little Help from another person to put on and taking off regular upper body clothing?: A  Little Help from another person to put on and taking off regular lower body clothing?: A Little 6 Click Score: 20   End of Session Equipment Utilized During Treatment: Gait belt  Activity Tolerance: Patient tolerated treatment well Patient left: in bed;with bed alarm set;with call bell/phone within reach  OT Visit Diagnosis: Unsteadiness on feet (R26.81);Other symptoms and signs involving cognitive function                Time: 1021-1040 OT Time Calculation (min): 19 min Charges:  OT General Charges $OT Visit: 1 Visit OT Evaluation $OT Eval Moderate Complexity: 1 Mod  09/02/2020  RP, OTR/L  Acute Rehabilitation Services  Office:  [place] 09/02/2020, 12:18 PM

## 2020-09-02 NOTE — TOC Initial Note (Signed)
Transition of Care [facility]) - Initial/Assessment Note    Patient Details  Name: Jill Jefferson MRN: [id number] Date of Birth: [birth year]  Transition of Care [facility]) CM/SW Contact:    Pollie Friar, RN Phone Number: 09/02/2020, 4:24 PM  Clinical Narrative:         CM met with the patient and patient having a hard time focusing and getting out what she wanted to say.          Patient lives at home alone. She says she has a sister that can check on her periodically.  Pt drives self to appointments.  Pt has recently gotten insurance and felt she had to change her PCP. Pt can stay with same MD at [facility].  No f/u per PT and no DME needs.  TOC following.  Expected Discharge Plan: Home/Self Care Barriers to Discharge: Continued Medical Work up   Patient Goals and CMS Choice        Expected Discharge Plan and Services Expected Discharge Plan: Home/Self Care   Discharge Planning Services: CM Consult   Living arrangements for the past 2 months: Apartment                                      Prior Living Arrangements/Services Living arrangements for the past 2 months: Apartment Lives with:: Self Patient language and need for interpreter reviewed:: Yes Do you feel safe going back to the place where you live?: Yes            Criminal Activity/Legal Involvement Pertinent to Current Situation/Hospitalization: No - Comment as needed  Activities of Daily Living Home Assistive Devices/Equipment: None ADL Screening (condition at time of admission) Patient's cognitive ability adequate to safely complete daily activities?: No Is the patient deaf or have difficulty hearing?: No Does the patient have difficulty seeing, even when wearing glasses/contacts?: No Does the patient have difficulty concentrating, remembering, or making decisions?: Yes Patient able to express need for assistance with ADLs?: No Does the patient have difficulty dressing or bathing?: No Independently  performs ADLs?: Yes (appropriate for developmental age) Does the patient have difficulty walking or climbing stairs?: No Weakness of Legs: None Weakness of Arms/Hands: None  Permission Sought/Granted                  Emotional Assessment Appearance:: Appears stated age Attitude/Demeanor/Rapport: Inconsistent Affect (typically observed): Restless Orientation: : Oriented to Self, Oriented to Place   Psych Involvement: No (comment)  Admission diagnosis:  Lactic acidosis [E87.2] Hyponatremia [E87.1] Hyperglycemia [R73.9] Altered mental status, unspecified altered mental status type [R41.82] Hyperosmolar hyperglycemic state (HHS) ([place]) [E11.00, O70.96] Acute metabolic encephalopathy [G83.66] Patient Active Problem List   Diagnosis Date Noted   Acute metabolic encephalopathy 29/47/6546   Hyperosmolar hyperglycemic state (HHS) ([place]) 09/01/2020   Focal neurological deficit 09/01/2020   AKI (acute kidney injury) ([place]) 09/01/2020   Hemoglobin A1c less than 7.0% 11/26/2018   Hyperglycemia 11/26/2018   Proteinuria 11/26/2018   Joint pain in fingers of both hands 08/03/2018   Bilateral lower extremity edema 05/03/2018   Diabetes mellitus ([place]) 01/31/2018   Age-related cataract of both eyes 01/31/2018   Elevated glucose 01/31/2018   PCP:  [name], FNP (Inactive) Pharmacy:   [facility] [address] [place] [address] Phone: [phone] Fax: [phone]     Social Determinants of Health (SDOH) Interventions    Readmission  Risk Interventions No flowsheet data found.

## 2020-09-03 ENCOUNTER — Inpatient Hospital Stay (HOSPITAL_COMMUNITY): Payer: 59

## 2020-09-03 ENCOUNTER — Other Ambulatory Visit: Payer: Self-pay

## 2020-09-03 DIAGNOSIS — R569 Unspecified convulsions: Secondary | ICD-10-CM

## 2020-09-03 DIAGNOSIS — G9341 Metabolic encephalopathy: Secondary | ICD-10-CM

## 2020-09-03 LAB — CBC WITH DIFFERENTIAL/PLATELET
Abs Immature Granulocytes: 0.03 10*3/uL (ref 0.00–0.07)
Basophils Absolute: 0 10*3/uL (ref 0.0–0.1)
Basophils Relative: 0 %
Eosinophils Absolute: 0.1 10*3/uL (ref 0.0–0.5)
Eosinophils Relative: 1 %
HCT: 32.6 % — ABNORMAL LOW (ref 36.0–46.0)
Hemoglobin: 11.3 g/dL — ABNORMAL LOW (ref 12.0–15.0)
Immature Granulocytes: 0 %
Lymphocytes Relative: 40 %
Lymphs Abs: 3.7 10*3/uL (ref 0.7–4.0)
MCH: 27.2 pg (ref 26.0–34.0)
MCHC: 34.7 g/dL (ref 30.0–36.0)
MCV: 78.4 fL — ABNORMAL LOW (ref 80.0–100.0)
Monocytes Absolute: 0.5 10*3/uL (ref 0.1–1.0)
Monocytes Relative: 6 %
Neutro Abs: 4.8 10*3/uL (ref 1.7–7.7)
Neutrophils Relative %: 53 %
Platelets: 296 10*3/uL (ref 150–400)
RBC: 4.16 MIL/uL (ref 3.87–5.11)
RDW: 12.3 % (ref 11.5–15.5)
WBC: 9.3 10*3/uL (ref 4.0–10.5)
nRBC: 0 % (ref 0.0–0.2)

## 2020-09-03 LAB — COMPREHENSIVE METABOLIC PANEL
ALT: 15 U/L (ref 0–44)
AST: 20 U/L (ref 15–41)
Albumin: 2.2 g/dL — ABNORMAL LOW (ref 3.5–5.0)
Alkaline Phosphatase: 66 U/L (ref 38–126)
Anion gap: 6 (ref 5–15)
BUN: 17 mg/dL (ref 6–20)
CO2: 27 mmol/L (ref 22–32)
Calcium: 8.7 mg/dL — ABNORMAL LOW (ref 8.9–10.3)
Chloride: 102 mmol/L (ref 98–111)
Creatinine, Ser: 2.22 mg/dL — ABNORMAL HIGH (ref 0.44–1.00)
GFR, Estimated: 25 mL/min — ABNORMAL LOW (ref >60)
Glucose, Bld: 109 mg/dL — ABNORMAL HIGH (ref 70–99)
Potassium: 3.5 mmol/L (ref 3.5–5.1)
Sodium: 135 mmol/L (ref 135–145)
Total Bilirubin: 0.7 mg/dL (ref 0.3–1.2)
Total Protein: 4.7 g/dL — ABNORMAL LOW (ref 6.5–8.1)

## 2020-09-03 LAB — RPR
RPR Ser Ql: REACTIVE — AB
RPR Titer: 1:2 {titer}

## 2020-09-03 LAB — MAGNESIUM: Magnesium: 2 mg/dL (ref 1.7–2.4)

## 2020-09-03 LAB — GLUCOSE, CAPILLARY
Glucose-Capillary: 117 mg/dL — ABNORMAL HIGH (ref 70–99)
Glucose-Capillary: 135 mg/dL — ABNORMAL HIGH (ref 70–99)
Glucose-Capillary: 153 mg/dL — ABNORMAL HIGH (ref 70–99)
Glucose-Capillary: 194 mg/dL — ABNORMAL HIGH (ref 70–99)
Glucose-Capillary: 195 mg/dL — ABNORMAL HIGH (ref 70–99)
Glucose-Capillary: 335 mg/dL — ABNORMAL HIGH (ref 70–99)

## 2020-09-03 LAB — AMMONIA: Ammonia: 16 umol/L (ref 9–35)

## 2020-09-03 IMAGING — MR MR MRA HEAD W/O CM
1 series · 19 of 48 positions shown · non-contrast
Comparison: No pertinent prior exam.

CLINICAL DATA: TIA.

EXAM:
MRA HEAD WITHOUT CONTRAST
TECHNIQUE: Angiographic images of the Circle of Willis were acquired using MRA
technique without intravenous contrast.

[Series 2: ax (id) · axial · 1.0mm · 0.43mm/px · z∈[-89,-5]mm · 19 of 176 slices shown]
[im 1/176]
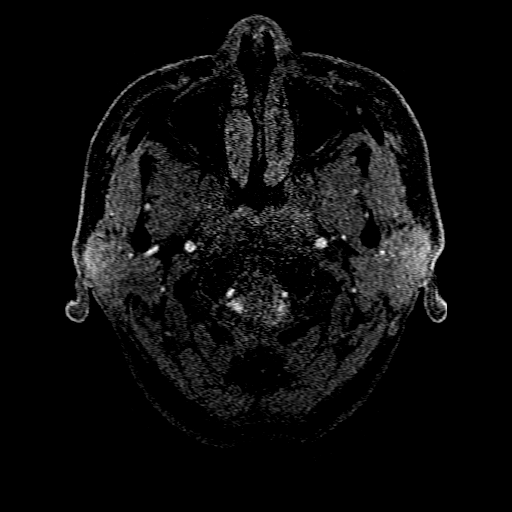
[im 4/176]
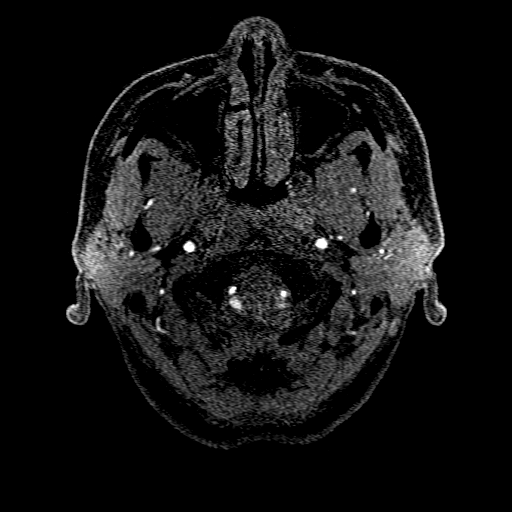
[im 8/176]
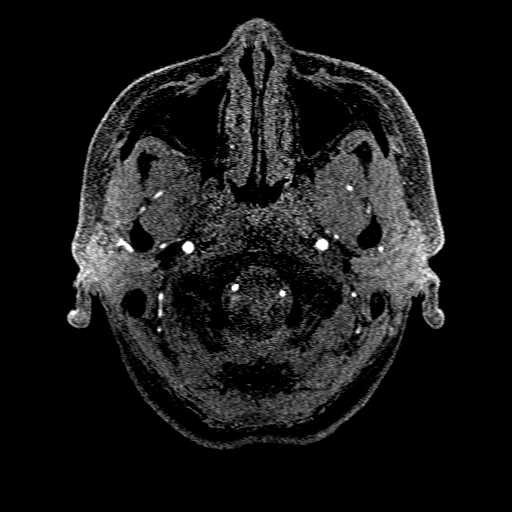
[im 12/176]
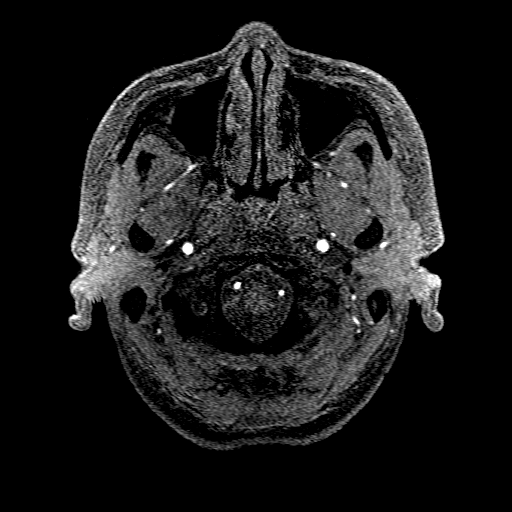
[im 15/176]
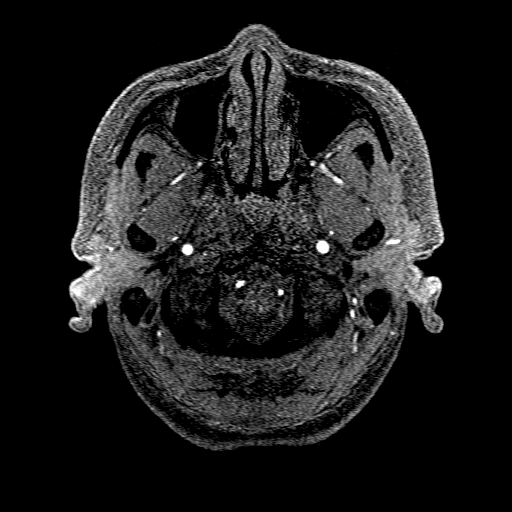
[im 19/176]
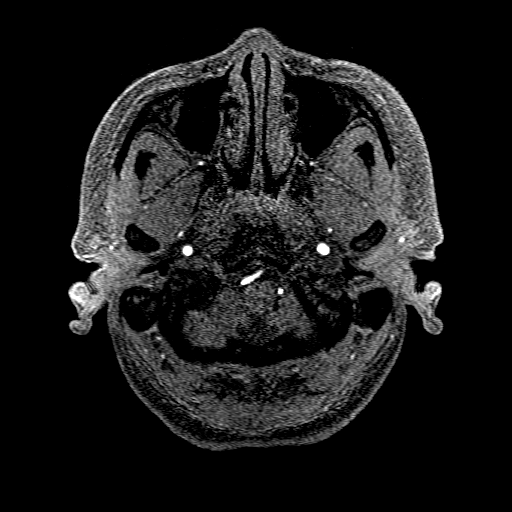
[im 23/176]
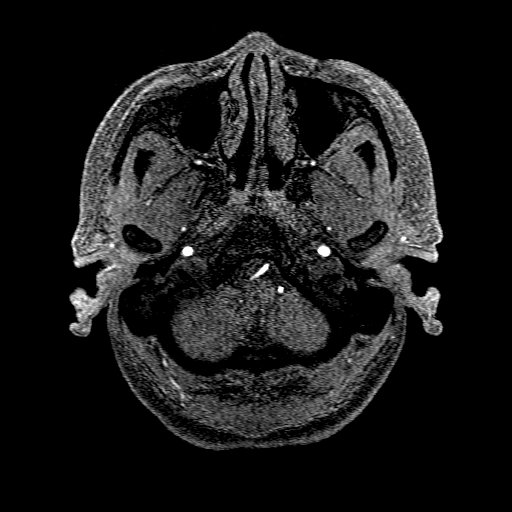
[im 27/176]
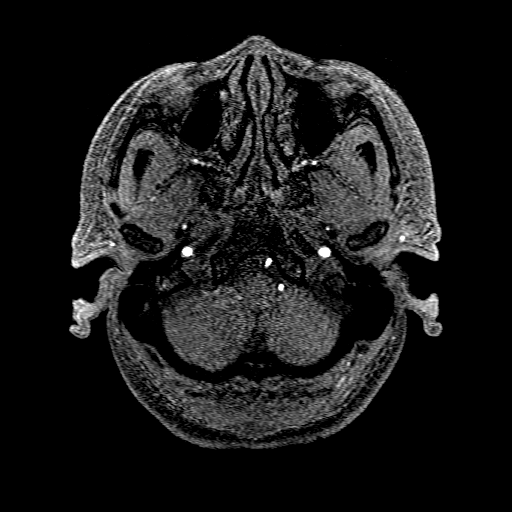
[im 30/176]
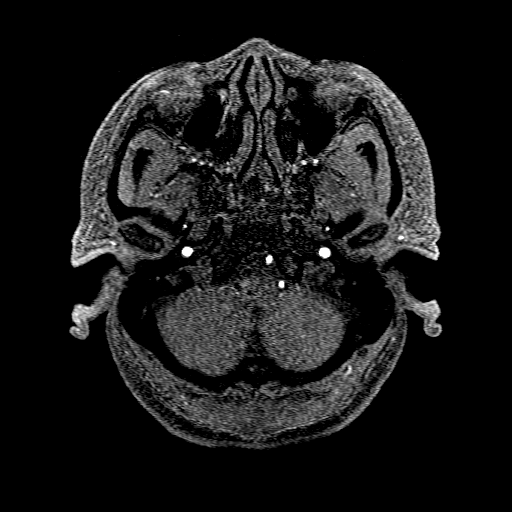
[im 34/176]
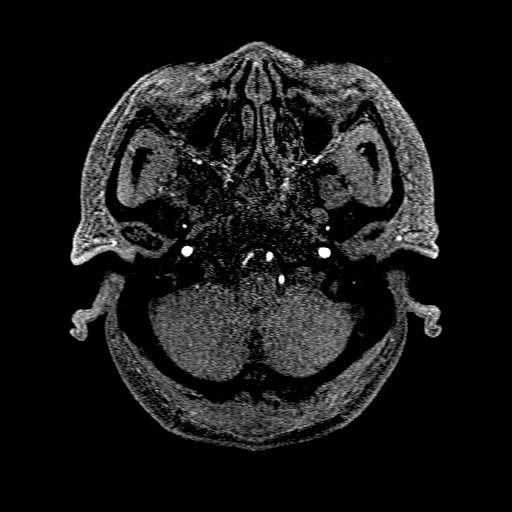
[im 38/176]
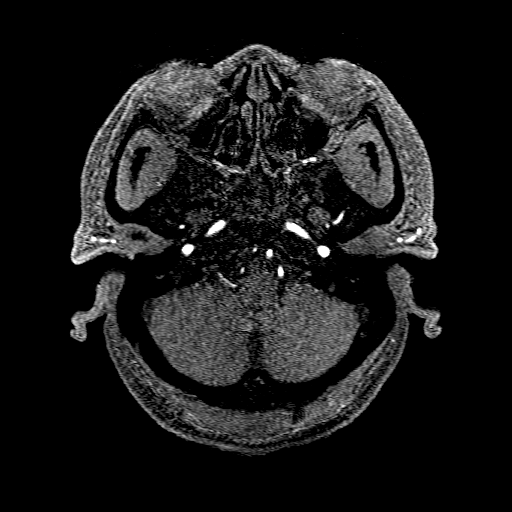
[im 56/176]
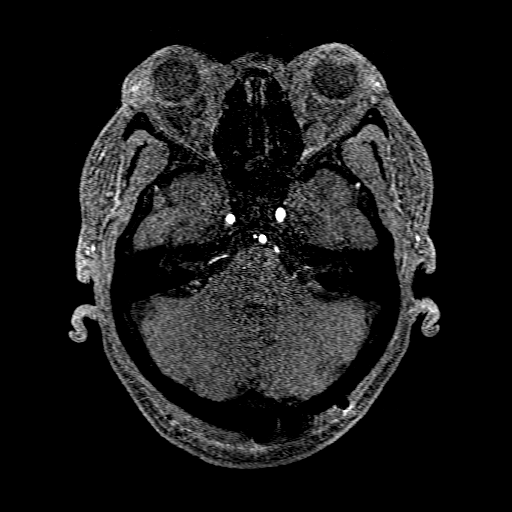
[im 79/176]
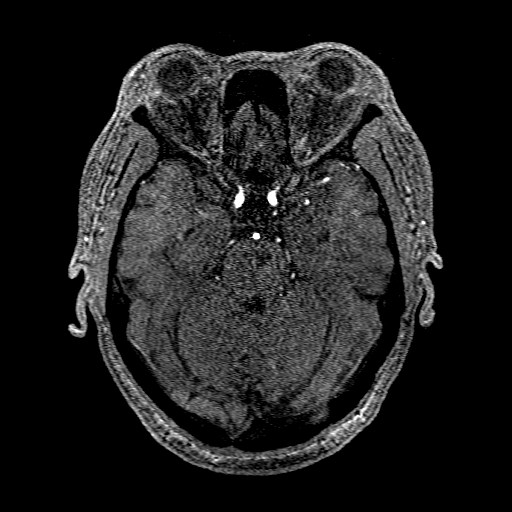
[im 90/176]
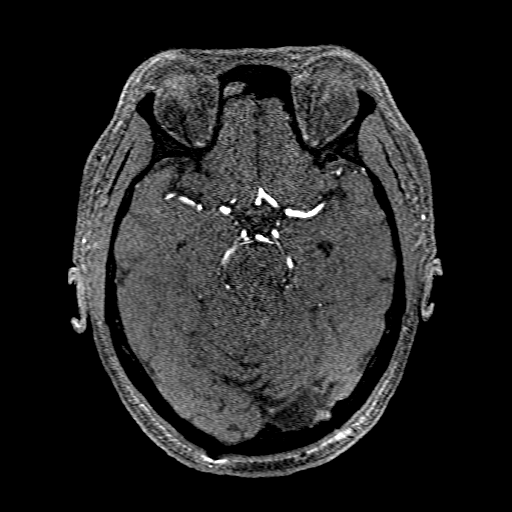
[im 101/176]
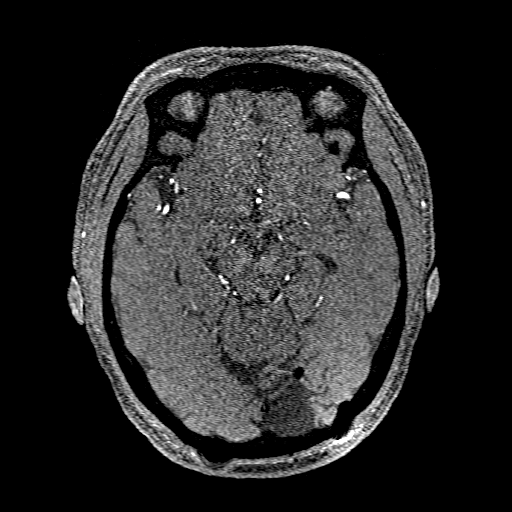
[im 123/176]
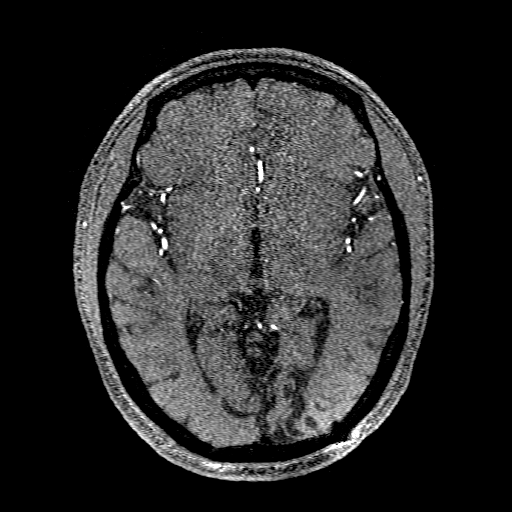
[im 146/176]
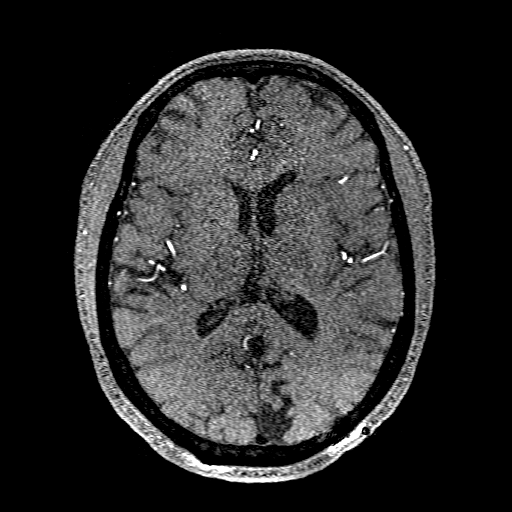
[im 149/176]
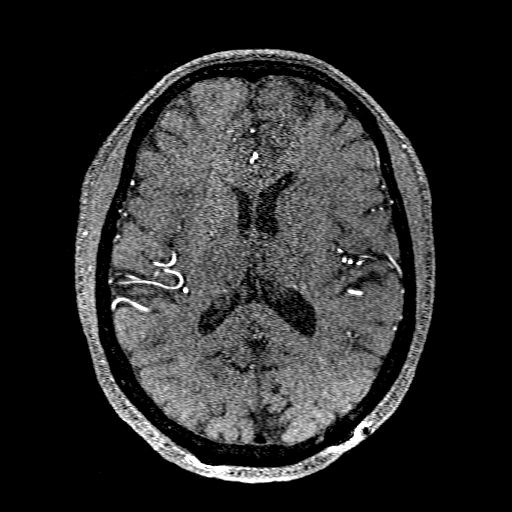
[im 168/176]
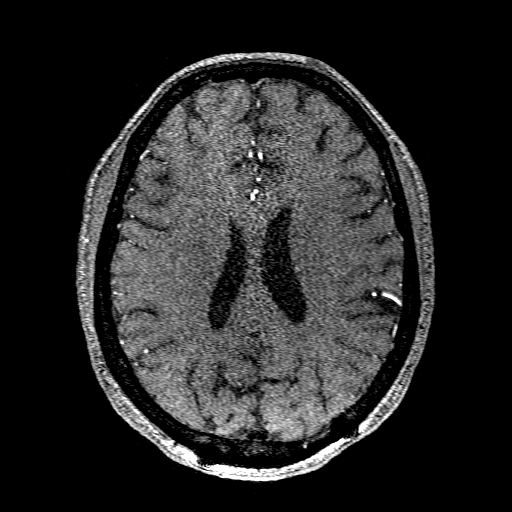

[19 of 48 positions shown; findings below may reference images not displayed]

FINDINGS: Anterior circulation: The internal carotid arteries are widely
patent from skull base to carotid termini. ACAs and MCAs are patent
without evidence of a proximal branch occlusion or significant
proximal stenosis. The right A1 segment is hypoplastic. No aneurysm
is identified.

Posterior circulation: The included intracranial portions of the
vertebral arteries are widely patent to the basilar and codominant.
Patent AICA and SCA origins are seen bilaterally. The basilar artery
is widely patent. There is a subtly bulbous configuration of the
distal basilar artery with the appearance on reformats suggestive of
a small fenestration (a normal variant). A discrete aneurysm is not
evident. There are right larger than left posterior communicating
arteries with hypoplasia of the right P1 segment. Both PCAs are
patent with mild irregularity but no flow limiting proximal
stenosis.

Anatomic variants: Hypoplastic right A1 segment. Predominantly fetal
type origin of the right PCA. Distal basilar artery fenestration.
IMPRESSION: No major branch occlusion or significant proximal stenosis.

## 2020-09-03 MED ORDER — LEVETIRACETAM IN NACL 500 MG/100ML IV SOLN
500.0000 mg | Freq: Two times a day (BID) | INTRAVENOUS | Status: DC
  Administered 2020-09-03 – 2020-09-04 (×3): 500 mg via INTRAVENOUS
  Filled 2020-09-03 (×4): qty 100

## 2020-09-03 MED ORDER — ACETAMINOPHEN 325 MG PO TABS
650.0000 mg | ORAL_TABLET | Freq: Four times a day (QID) | ORAL | Status: DC | PRN
  Administered 2020-09-03 – 2020-09-06 (×4): 650 mg via ORAL
  Filled 2020-09-03 (×4): qty 2

## 2020-09-03 MED ORDER — LEVETIRACETAM IN NACL 1000 MG/100ML IV SOLN
1000.0000 mg | Freq: Once | INTRAVENOUS | Status: AC
  Administered 2020-09-03: 1000 mg via INTRAVENOUS
  Filled 2020-09-03: qty 100

## 2020-09-03 MED ORDER — MIDAZOLAM HCL 2 MG/2ML IJ SOLN: 2.0000 mg | Freq: Four times a day (QID) | INTRAMUSCULAR | Status: DC | PRN

## 2020-09-03 NOTE — Progress Notes (Signed)
Neurology Progress Note  Patient ID: Jill Jefferson is a 60 y.o. with PMHx of  has a past medical history of Diabetes ([place]), Edema, H. pylori infection (06/2019), H. pylori infection (06/2019), Hypertension, and Proteinuria (11/2018). She presented to the ED 8/25 for evaluation of AMS when her hairdresser noted that she did not sound like her normal self over the telephone. A family member went to check on her and noted that she was speaking unintelligibly in [place] and [place] and was noted to be intermittently aphasic on hospital arrival. Initial work up revealed a glucose of 829, creatinine elevation at 2.52 (baseline 1.6), and her initial CTH was negative for acute abnormality. MRI brain obtained without evidence of acute stroke.  Initially consulted for: Intermittent aphasia  Major interval events:  09/03/2020: Routine EEG with five subclinical seizures, transitioned to LTM EEG. Loaded with 1 gram of Keppra and initiated on 500 mg BID. RPR reactive with a 1:2 titer. 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   Subjective: LTM EEG monitoring in place, patient without clinical seizure activity on assessment  Exam: Vitals:   09/02/20 2338 09/03/20 0354  BP: (!) 168/91 (!) 157/91  Pulse: 80 76  Resp: 17 18  Temp: 99 F (37.2 C) 99.1 F (37.3 C)  SpO2: 100% 100%   Gen: Laying in bed, sleeping on initial assessment. In no acute distress Resp: non-labored breathing, no grossly audible wheezing. SpO2 of 100% on room air on cardiac monitor Cardiac: Perfusing extremities well with 1+ pedal edema bilaterally Abd: soft, non-tender, non-distended  Neuro: MS: Asleep initially, wakes easily to voice.  She is unable to provide a clear and coherent history of present illness.  She is oriented to self, place, year, month, and situation. She incorrectly states that she is 60 years old. When asked about her history of syphilis she states  that it was "a long time ago" and she was treated in [place]. She is unable to provide further details regarding an estimate of how long ago and follow up visits. When asked if it was over ten years ago she says "no not 10 years ago it was last [unintelligible]" and when re-questioned about it she states "you are going to tell everyone I have syphilis". Speech is fluent with intermittent unintelligible words or endings of sentences. Speech is without dysarthria.  Follows all commands.  Naming is intact to 5/6 objects. Unable to name remote, she initially states phone multiple times and then stutters over unintelligible words before describing it as "the thing you use to talk to other people in the building". Repetition intact to a single phrase but on the second phrase it is impaired.  No neglect is noted.  CN: PERRL 4 mm and brisk with right pupil cataract, EOMI without ptosis, visual fields are full, facial sensation to light touch is intact and symmetric, face is symmetric resting and smiling, hearing is intact to voice, phonation is normal, palate rises symmetrically, shoulders shrug symmetrically, tongue protrudes midline. Motor: 5/5 strength throughout without vertical drift Sensory: Intact and symmetric to light touch throughout upper and lower extremities.  DTR: 2+ and symmetric biceps, brachioradialis, and patellae.  Cerebellar: FNF intact bilaterally without dysmetria.  Gait: Deferred   NP examination above, on later attending evaluation, the patient continued to have some mild expressive aphasia, but sister at bedside confirms that the patient was much improving since previously during hospitalization though still  not at baseline.  Pertinent Labs: CBC    Component Value Date/Time   WBC 9.3 09/03/2020 0145   RBC 4.16 09/03/2020 0145   HGB 11.3 (L) 09/03/2020 0145   HGB 11.1 06/23/2019 1208   HCT 32.6 (L) 09/03/2020 0145   HCT 33.2 (L) 06/23/2019 1208   PLT 296 09/03/2020 0145    PLT 257 06/23/2019 1208   MCV 78.4 (L) 09/03/2020 0145   MCV 81 06/23/2019 1208   MCH 27.2 09/03/2020 0145   MCHC 34.7 09/03/2020 0145   RDW 12.3 09/03/2020 0145   RDW 12.4 06/23/2019 1208   LYMPHSABS 3.7 09/03/2020 0145   LYMPHSABS 1.8 06/23/2019 1208   MONOABS 0.5 09/03/2020 0145   EOSABS 0.1 09/03/2020 0145   EOSABS 0.1 06/23/2019 1208   BASOSABS 0.0 09/03/2020 0145   BASOSABS 0.0 06/23/2019 1208   CMP     Component Value Date/Time   NA 135 09/03/2020 0145   NA 139 06/23/2019 1208   K 3.5 09/03/2020 0145   CL 102 09/03/2020 0145   CO2 27 09/03/2020 0145   GLUCOSE 109 (H) 09/03/2020 0145   BUN 17 09/03/2020 0145   BUN 27 (H) 06/23/2019 1208   CREATININE 2.22 (H) 09/03/2020 0145   CALCIUM 8.7 (L) 09/03/2020 0145   PROT 4.7 (L) 09/03/2020 0145   PROT 6.3 06/23/2019 1208   ALBUMIN 2.2 (L) 09/03/2020 0145   ALBUMIN 3.9 06/23/2019 1208   AST 20 09/03/2020 0145   ALT 15 09/03/2020 0145   ALKPHOS 66 09/03/2020 0145   BILITOT 0.7 09/03/2020 0145   BILITOT 0.4 06/23/2019 1208   GFRNONAA 25 (L) 09/03/2020 0145   GFRAA 39 (L) 06/23/2019 1208   Lab Results  Component Value Date   CHOL 277 (H) 09/02/2020   HDL 42 09/02/2020   LDLCALC 214 (H) 09/02/2020   TRIG 104 09/02/2020   CHOLHDL 6.6 09/02/2020   Lab Results  Component Value Date   HGBA1C 9.1 (A) 06/23/2019   HGBA1C 9.1 06/23/2019   HGBA1C 9.1 (A) 06/23/2019   HGBA1C 9.1 (A) 06/23/2019   Imaging Reviewed:  MRI brain without contrast 09/02/2020: No acute abnormality Left occipital craniotomy with underlying chronic encephalomalacia.  CT Head without contrast 09/01/2020: Old left occipital infarct with encephalomalacia. No acute intracranial abnormality.  MRA head without contrast 09/03/2020: No major branch occlusion or significant proximal stenosis.  Routine EEG 09/03/2020: "This study showed five seizure without clinical signs arising from left hemisphere, maximal left temporal region. First seizure was  ongoing at onset of study on 09/03/2020 at 1134 followed by seizure at 1143, 1145, 1146 and 1154. Average duration of seizures was about 20-40 seconds. Brief ictal-interictal rhythmic discharges were also noted left hemisphere, maximal left temporal region which are suggestive of high potential for seizure recurrence. 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  - Exam reveals patient with speech that is fluent but with decreased information content. Mild naming deficit and impaired repetition are appreciated with interspersed unintelligible words. She remains oriented to person, place, month, year, and somewhat to situation. She incorrectly states her age. Overall appears mildly confused.  - MRI brain reveals chronic  encephalomalacia left occipital lobe with overlying craniotomy. No mass-effect or edema. No acute abnormality. MRA is without major branch occlusion or significant proximal stenosis.  - Routine EEG today revealed 5 subclinical seizures. Patient was transitioned to LTM EEG, loaded with 1 gram of Keppra with maintenance dosing of 500 mg BID.  - Carotid ultrasound with 1-39% ICA stenoses bilaterally. Bilateral vertebral arteries demonstrate antegrade flow. - TTE: Left ventricular ejection fraction, by estimation, is 65%. A small pericardial effusion is present.  No intracardiac source of embolism detected on this transthoracic study.  - Given no acute stroke on MRI, other etiologies for her presentation such as subclinical seizure (has a chronic left occipital lobe lesion on  imaging) triggered by her hyperglycemia versus metabolic encephalopathy secondary to HHS and other possible metabolic causes should be considered. Presentation is felt to most likely be multifactorial with a combination of the etiologies listed above with EEG evidence of seizures from the left hemisphere.  - AST, ALT, TSH normal. B12 normal. 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 RPR titer 1:2 which is reassuring.   Recommendations: - EEG LTM to monitor for seizure control - Keppra 1000 mg load, then 500 mg BID. Adjust as needed for renal function:  Estimated Creatinine Clearance: 23.2 mL/min (A) (by C-G formula based on SCr of 2.22 mg/dL (H)).   CrCl 80 to 130 mL/minute/1.73 m2: 500 mg to 1.5 g every 12 hours.  CrCl 50 to <80 mL/minute/1.73 m2: 500 mg to 1 g every 12 hours.  CrCl 30 to <50 mL/minute/1.73 m2: 250 to 750 mg every 12 hours.  CrCl 15 to <30 mL/minute/1.73 m2: 250 to 500 mg every 12 hours.  CrCl <15 mL/minute/1.73 m2: 250 to 500 mg every 24 hours (expert opinion). - Inpatient seizure precautions - 2 mg Versed PRN any seizure activity lasting > 5 minutes and notify neurology  - Attending discussed seizure precautions as detailed below with patient and family, and these should be included in discharge instructions - Attending discussed at length with patient and family etiology of seizures, likelihood of poststroke epilepsy, and prognosis, answering many questions  Standard seizure precautions: Per [facility] statutes, patients with seizures are not allowed to drive until  they have been seizure-free for six months. Use caution when using heavy equipment or power tools. Avoid working on ladders or at heights. Take showers instead of baths. Ensure the water temperature is not too high on the home water heater. Do not go swimming alone. When caring for infants  or small children, sit down when holding, feeding, or changing them to minimize risk of injury to the child in the event you have a seizure.  To reduce risk of seizures, maintain good sleep hygiene avoid alcohol and illicit drug use, take all anti-seizure medications as prescribed.    Anibal Henderson, AGACNP-BC [facility] [phone]  Attending Neurologist's note:  I personally saw this patient, gathering history, performing a full neurologic examination, reviewing relevant labs, personally reviewing relevant imaging including MRI brain and MRA, and formulated the assessment and plan, adding the note above for completeness and clarity to accurately reflect my thoughts   Lesleigh Noe MD-PhD [facility] [phone]   Greater than 35 minutes were spent in direct care of this patient today, of which over 50% was at bedside and discussion with family as detailed above

## 2020-09-03 NOTE — Progress Notes (Signed)
PT Cancellation Note  Patient Details Name: Jill Jefferson MRN: [id number] DOB: [birth year]   Cancelled Treatment:    Reason Eval/Treat Not Completed: Patient at procedure or test/unavailable EEG monitoring currently. PT will re-attempt as time allows.   Lusine Corlett A. Gilford Rile PT, DPT Acute Rehabilitation Services Pager [phone] Office [phone]    Linna Hoff 09/03/2020, 11:32 AM

## 2020-09-03 NOTE — Procedures (Signed)
Patient Name: Jill Jefferson  MRN: [id number]  Epilepsy Attending: Lora Havens  Referring Physician/Provider: Dr Kerney Elbe Date: 09/03/2020 Duration: 22.83mns  Patient history: 60year old female presenting with aphasia and confusion in the context of HHS with a blood glucose level of 829. EEG to evaluate for seizure  Level of alertness: Awake, asleep  AEDs during EEG study: None  Technical aspects: This EEG study was done with scalp electrodes positioned according to the 10-20 International system of electrode placement. Electrical activity was acquired at a sampling rate of '500Hz'$  and reviewed with a high frequency filter of '70Hz'$  and a low frequency filter of '1Hz'$ . EEG data were recorded continuously and digitally stored.   Description: The posterior dominant rhythm consists of 9 Hz activity of moderate voltage (25-35 uV) seen predominantly in posterior head regions, symmetric and reactive to eye opening and eye closing. Sleep was characterized by vertex waves, sleep spindles (12 to 14 Hz), maximal frontocentral region. Lateralized periodic discharges were noted in left hemisphere, maximal left temporal region, at 1-'2hz'$  which at times appeared rhythmic without definite evolution consistent with brief-ictal-interictal rhythmic discharges. Intermittent rhythmic 3-'5hz'$  theta-delta slowing was also noted in left hemisphere, maximal left temporal region.  Five seizure without clinical signs were also noted arising from left hemisphere, maximal left temporal region. First seizure was ongoing at onset of study on 09/03/2020 at 1134. This was followed by seizure at 1143, 1145, 1146 and 1154. Average duration of seizures was about 20-40 seconds. During seizure EEG showed 1-1.5 hz periodic sharp waves in left hemisphere, maximal left temporal region which gradually increased in amplitude as well as frequency increased to 2-2.'5hz'$ . There was also 5-'6hz'$  theta slowing which gradually evolved into 2-'3hz'$   delta slowing.   Hyperventilation and photic stimulation were not performed.     ABNORMALITY - Seizure without clinical signs, left hemisphere, maximal left temporal region  - Brief ictal-interictal rhythmic discharges, left hemisphere, maximal left temporal region  ( BIRDs) - Lateralized periodic discharges,left hemisphere, maximal left temporal region  (LPD) - Intermittent slow, left hemisphere, maximal left temporal region  (  IMPRESSION: This study showed five seizure without clinical signs arising from left hemisphere, maximal left temporal region. First seizure was ongoing at onset of study on 09/03/2020 at 1134 followed by seizure at 1143, 1145, 1146 and 1154. Average duration of seizures was about 20-40 seconds. Brief ictal-interictal rhythmic discharges were also noted left hemisphere, maximal left temporal region which are suggestive of high potential for seizure recurrence. Additionally, there is evidence of cortical dysfunction in left hemisphere, maximal left temporal region most likely due to underlying structural abnormality/stroke and seizure.   Dr BCurly Sh[place] notified.   Kethan Papadopoulos OBarbra Sarks

## 2020-09-03 NOTE — Progress Notes (Signed)
Patient has been c/o excessive saliva throughout the night, first thinking she was going to vomit but then nausea subsided and patient continues to have persistent saliva in which she needs to spit out. Will report to day shift RN to inform provider.

## 2020-09-03 NOTE — Progress Notes (Signed)
LTM EEG hooked up and running - no initial skin breakdown - push button tested - neuro notified. [facility] monitoring.  

## 2020-09-03 NOTE — Progress Notes (Signed)
EEG completed, results pending. 

## 2020-09-03 NOTE — Progress Notes (Signed)
PROGRESS NOTE    Jill Jefferson  [id number] DOB: 07-28-1960 DOA: 09/01/2020 PCP: [name], FNP (Inactive)   Chief Complaint  Patient presents with   Altered Mental Status   Brief Narrative:  Jill Jefferson is Jill Jefferson 60 y.o. female with medical history significant of cataracts, edema, diabetes, hypertension, H. pylori presents with altered mental status.  Patient presented altered and oriented to year and self only.  Unable to fully participate in history.  Additional history was obtained with assistance of chart review and family.  Her sister states that her hairstylist called earlier today because the patient canceled her appointment I did not sound right on the phone.  Another family member went to evaluate the patient and noted that she was talking strange and that she was speaking unintelligibly in both [place] and [place].   For me she is intermittently aphasic able to answer some questions appropriately and follows most commands appropriately.  At times when trying to discuss her difficulty expressing herself she repeats "something going on, something going on".   Patient denies fevers, chills, chest pain, shortness of breath, abdominal pain, constipation, diarrhea, nausea, vomiting.    ED Course: Vital signs in the ED significant for blood pressure initially in the 200s now in the 120s with intervention.  Initially tachycardic but now not.  Lab work-up showed BMP with sodium 129 but this corrects to normal considering glucose of 829.  Chloride 93, creatinine elevated 2.52 from baseline of around 1.6.  Calcium 8.8.  LFTs pending.  CBC within normal limits.  Lactic acid 2.5 with repeat pending.  Serum osmolality elevated at 322.  BNP normal.  Respiratory panel for flu and COVID-negative.  Urine analysis showed small hemoglobin and protein.  Ethanol level and UDS are pending.  Chest x-ray showed no acute normalities.  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  Patient given Jill Jefferson dose of amlodipine, hydrochlorothiazide and labetalol in the ED.  Also started on insulin drip given 2 L of IV fluids and 2 doses of 10 mEq IV potassium.  Assessment & Plan:   Principal Problem:   Hyperosmolar hyperglycemic state (HHS) ([place]) Active Problems:   Diabetes mellitus ([place])   Bilateral lower extremity edema   Focal neurological deficit   AKI (acute kidney injury) ([place])   Acute metabolic encephalopathy  Acute encephalopathy > Suspect to be metabolic encephalopathy in setting of HHS as this has been improving per EDP. > No focal deficits on exam, but issues with word finding and following commands.  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 > MRI pwithout acute abnormality, L occipital craniotomy with underlying chronic encephalomalacia > no major branch occlusion or significant proximal stenosis on MRA - Neurology consult - recommending EEG, speech therapy, MRA, ASA, ammonia, RPR, high dose thiamine - Allow for permissive HTN (systolic < [id number] and diastolic < 123456)  - ASA, Continue Statin  - Echocardiogram - with EF 65%, no RWMA, severe asymmetric LV hypertrophy, grade I diastolic dysfunction (no intracardiac source of embolism - consider TEE if clinically indicated - defer to neurology) - 1-39%, bilateral vertebral arteries  -  A1C pending, Lipid panel (LDL elevated) -> will increase to 40 mg  - Tele monitoring  - SLP/PT/OT - follow TSH (wnl), B12 (wnl), folate (wnl), VBG (no hypercarbia), RPR (pending), ammonia wnl - UA not c/w UTI, CXR without evidence of acute cardiopulm process   HHS > Patient presenting altered  as above.  Found to have glucose in the 800s.  Serum osmolality 322.  Mildly elevated lactic acid 2.5 with Jill Jefferson creatinine of 2.5 elevated from baseline of 1.6. - he's off insulin gtt now - continue levemir, SSI - carb mod   AKI > Creatinine elevated to 2.52 from baseline around  1.6. - initially improved, but fluctuating - UA with 100 mg/dl protein, follow renal US -> atrophic L kidney and borderline atrophic right kidney   Hypertension > BP initially elevated to [id number] systolic in the ED now improved to the 120s with multiple interventions in the ED. - We will hold off on additional interventions while awaiting MRI brain to further evaluate her mentation.   Hypokalemia - replace and follow  DVT prophylaxis: lovenox Code Status: full  Family Communication: sister over phone 8/26 Disposition:   Status is: Observation  The patient will require care spanning > 2 midnights and should be moved to inpatient because: Inpatient level of care appropriate due to severity of illness  Dispo: The patient is from: Home              Anticipated d/c is to: Home              Patient currently is not medically stable to d/c.   Difficult to place patient No       Consultants:  neurology  Procedures:  Carotid US Summary:  Right Carotid: Velocities in the right ICA are consistent with Jill Jefferson 1-39%  stenosis.   Left Carotid: Velocities in the left ICA are consistent with Jill Jefferson 1-39%  stenosis.   Vertebrals:  Bilateral vertebral arteries demonstrate antegrade flow.  Subclavians: Normal flow hemodynamics were seen in bilateral subclavian               arteries.   Echo IMPRESSIONS     1. Left ventricular ejection fraction, by estimation, is 65%. The left  ventricle has normal function. The left ventricle has no regional wall  motion abnormalities. There is severe asymmetric left ventricular  hypertrophy of the septal segment. Left  ventricular diastolic parameters are consistent with Grade I diastolic  dysfunction (impaired relaxation).   2. Right ventricular systolic function is normal. The right ventricular  size is normal. Tricuspid regurgitation signal is inadequate for assessing  PA pressure.   3. Jill Jefferson small pericardial effusion is present.   4. The mitral valve is  normal in structure. No evidence of mitral valve  regurgitation. No evidence of mitral stenosis.   5. The aortic valve is tricuspid. Aortic valve regurgitation is not  visualized. No aortic stenosis is present.   6. The inferior vena cava is normal in size with greater than 50%  respiratory variability, suggesting right atrial pressure of 3 mmHg.   Conclusion(s)/Recommendation(s): No intracardiac source of embolism  detected on this transthoracic study. Rojelio Uhrich transesophageal echocardiogram is  recommended to exclude cardiac source of embolism if clinically indicated  particularly in setting of suboptimal  apical window.   Antimicrobials:  Anti-infectives (From admission, onward)    None          Subjective: No new complaints  Objective: Vitals:   09/02/20 1614 09/02/20 1946 09/02/20 2338 09/03/20 0354  BP: (!) 159/89 (!) 143/70 (!) 168/91 (!) 157/91  Pulse: 78 84 80 76  Resp: '20 17 17 18  '$ Temp: 98.5 F (36.9 C) 99.2 F (37.3 C) 99 F (37.2 C) 99.1 F (37.3 C)  TempSrc: Oral Oral Oral Oral  SpO2: 100% 100% 100% 100%  Weight:      Height:        Intake/Output Summary (Last 24 hours) at 09/03/2020 1011 Last data filed at 09/02/2020 1905 Gross per 24 hour  Intake 117 ml  Output --  Net 117 ml   Filed Weights   09/01/20 2326  Weight: 65 kg    Examination:  General: No acute distress. Cardiovascular: RRR Lungs: unlabored Abdomen: Soft, nontender, nondistended Neurological: Alert and oriented 3 - . Moves all extremities 4 with equal strength. Cranial nerves II through XII intact. Skin: Warm and dry. No rashes or lesions. Extremities: No clubbing or cyanosis. No edema.   Data Reviewed: I have personally reviewed following labs and imaging studies  CBC: Recent Labs  Lab 09/01/20 1450 09/01/20 1506 09/03/20 0145  WBC 9.9  --  9.3  NEUTROABS 8.1*  --  4.8  HGB 12.8 13.6 11.3*  HCT 38.3 40.0 32.6*  MCV 80.6  --  78.4*  PLT 296  --  296    Basic  Metabolic Panel: Recent Labs  Lab 09/01/20 1458 09/01/20 1506 09/01/20 2252 09/02/20 0630 09/02/20 1330 09/03/20 0145  NA 129* 124* 138 136 131* 135  K 3.6 3.9 2.8* 2.6* 3.4* 3.5  CL 93*  --  102 100 96* 102  CO2 27  --  '28 29 29 27  '$ GLUCOSE 821*  --  134* 133* 258* 109*  BUN 20  --  '16 13 10 17  '$ CREATININE 2.52*  --  1.98* 1.89* 1.87* 2.22*  CALCIUM 8.8*  --  8.9 8.7* 8.6* 8.7*  MG  --   --   --  1.6*  --  2.0    GFR: Estimated Creatinine Clearance: 23.2 mL/min (Tamre Cass) (by C-G formula based on SCr of 2.22 mg/dL (H)).  Liver Function Tests: Recent Labs  Lab 09/01/20 2252 09/03/20 0145  AST 18 20  ALT 17 15  ALKPHOS 78 66  BILITOT 0.6 0.7  PROT 5.2* 4.7*  ALBUMIN 2.6* 2.2*    CBG: Recent Labs  Lab 09/02/20 1618 09/02/20 2102 09/03/20 0009 09/03/20 0437 09/03/20 0803  GLUCAP 209* 339* 135* 117* 153*     Recent Results (from the past 240 hour(s))  Resp Panel by RT-PCR (Flu Talayeh Bruinsma&B, Covid) Nasopharyngeal Swab     Status: None   Collection Time: 09/01/20  3:27 PM   Specimen: Nasopharyngeal Swab; Nasopharyngeal(NP) swabs in vial transport medium  Result Value Ref Range Status   SARS Coronavirus 2 by RT PCR NEGATIVE NEGATIVE Final    Comment: (NOTE) SARS-CoV-2 target nucleic acids are NOT DETECTED.  The SARS-CoV-2 RNA is generally detectable in upper respiratory specimens during the acute phase of infection. The lowest concentration of SARS-CoV-2 viral copies this assay can detect is 138 copies/mL. Ralphael Southgate negative result does not preclude SARS-Cov-2 infection and should not be used as the sole basis for treatment or other patient management decisions. Joeanne Robicheaux negative result may occur with  improper specimen collection/handling, submission of specimen other than nasopharyngeal swab, presence of viral mutation(s) within the areas targeted by this assay, and inadequate number of viral copies(<138 copies/mL). Constancia Geeting negative result must be combined with clinical observations, patient  history, and epidemiological information. The expected result is Negative.  Fact Sheet for Patients:  EntrepreneurPulse.com.au  Fact Sheet for Healthcare Providers:  IncredibleEmployment.be  This test is no t yet approved or cleared by the [place] FDA and  has been authorized for detection and/or diagnosis of SARS-CoV-2 by FDA under an Emergency Use Authorization (EUA).  This EUA will remain  in effect (meaning this test can be used) for the duration of the COVID-19 declaration under Section 564(b)(1) of the Act, 21 U.S.C.section 360bbb-3(b)(1), unless the authorization is terminated  or revoked sooner.       Influenza Travarus Trudo by PCR NEGATIVE NEGATIVE Final   Influenza B by PCR NEGATIVE NEGATIVE Final    Comment: (NOTE) The Xpert Xpress SARS-CoV-2/FLU/RSV plus assay is intended as an aid in the diagnosis of influenza from Nasopharyngeal swab specimens and should not be used as Ajahnae Rathgeber sole basis for treatment. Nasal washings and aspirates are unacceptable for Xpert Xpress SARS-CoV-2/FLU/RSV testing.  Fact Sheet for Patients: EntrepreneurPulse.com.au  Fact Sheet for Healthcare Providers: IncredibleEmployment.be  This test is not yet approved or cleared by the [place] FDA and has been authorized for detection and/or diagnosis of SARS-CoV-2 by FDA under an Emergency Use Authorization (EUA). This EUA will remain in effect (meaning this test can be used) for the duration of the COVID-19 declaration under Section 564(b)(1) of the Act, 21 U.S.C. section 360bbb-3(b)(1), unless the authorization is terminated or revoked.  Performed at [facility], [place] [address] [place], [place] [address]          Radiology Studies: DG Chest 2 View  Result Date: 09/01/2020 CLINICAL DATA:  Altered mental status EXAM: CHEST - 2 VIEW COMPARISON:  None. FINDINGS: The cardiomediastinal silhouette is normal. There is  no focal consolidation or pulmonary edema. There is no pleural effusion or pneumothorax. There is no acute osseous abnormality. IMPRESSION: No radiographic evidence of acute cardiopulmonary process. Electronically Signed   By: Valetta Mole M.D.   On: 09/01/2020 15:40   DG Abd 1 View  Result Date: 09/02/2020 CLINICAL DATA:  60 year old female with constipation. EXAM: ABDOMEN - 1 VIEW COMPARISON:  None. FINDINGS: The bowel gas pattern is normal. No radio-opaque calculi or other significant radiographic abnormality are seen. IMPRESSION: Nonobstructive bowel gas pattern. Electronically Signed   By: Ruthann Cancer M.D.   On: 09/02/2020 12:53   CT Head Wo Contrast  Result Date: 09/01/2020 CLINICAL DATA:  Mental status change, unknown cause EXAM: CT HEAD WITHOUT CONTRAST TECHNIQUE: Contiguous axial images were obtained from the base of the skull through the vertex without intravenous contrast. COMPARISON:  None. FINDINGS: Brain: Encephalomalacia in the left occipital lobe, presumably old infarct. No acute intracranial abnormality. Specifically, no hemorrhage, hydrocephalus, mass lesion, acute infarction, or significant intracranial injury. Vascular: No hyperdense vessel or unexpected calcification. Skull: No acute calvarial abnormality. 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 No acute intracranial abnormality. Electronically Signed   By: Rolm Baptise M.D.   On: 09/01/2020 18:06   MR ANGIO HEAD WO CONTRAST  Result Date: 09/03/2020 CLINICAL DATA:  TIA. EXAM: MRA HEAD WITHOUT CONTRAST TECHNIQUE: Angiographic images of the Circle of Willis were acquired using MRA technique without intravenous contrast. COMPARISON:  No pertinent prior exam. FINDINGS: Anterior circulation: The internal carotid arteries are widely patent from skull base to carotid termini. ACAs and MCAs are patent without evidence of Bode Pieper proximal branch occlusion or significant proximal  stenosis. The right A1 segment is hypoplastic. No aneurysm is identified. Posterior circulation: The included intracranial portions of the vertebral arteries are widely patent to the basilar and codominant. Patent AICA and SCA origins are seen bilaterally. The basilar artery is widely patent. There is Tylicia Sherman subtly bulbous configuration of the distal basilar artery with the appearance on reformats suggestive of Kennis Wissmann small fenestration (Tyee Vandevoorde normal  variant). Jatara Huettner discrete aneurysm is not evident. There are right larger than left posterior communicating arteries with hypoplasia of the right P1 segment. Both PCAs are patent with mild irregularity but no flow limiting proximal stenosis. Anatomic variants: Hypoplastic right A1 segment. Predominantly fetal type origin of the right PCA. Distal basilar artery fenestration. IMPRESSION: No major branch occlusion or significant proximal stenosis. Electronically Signed   By: Logan Bores M.D.   On: 09/03/2020 10:03   MR BRAIN WO CONTRAST  Result Date: 09/02/2020 CLINICAL DATA:  Acute neuro deficit.  Altered mental status EXAM: MRI HEAD WITHOUT CONTRAST TECHNIQUE: Multiplanar, multiecho pulse sequences of the brain and surrounding structures were obtained without intravenous contrast. COMPARISON:  CT head 09/01/2020 FINDINGS: Brain: Ventricle size and cerebral volume normal. Enlarged sella filled with CSF. Probable empty sella which is likely an incidental finding. Chronic encephalomalacia left occipital lobe with overlying craniotomy. No mass-effect or edema. Negative for acute infarct chronic blood products in the left occipital encephalomalacia. Vascular: Normal arterial flow voids Skull and upper cervical spine: Left occipital craniotomy Sinuses/Orbits: Mucosal edema paranasal sinuses. Right cataract extraction Other: None IMPRESSION: No acute abnormality Left occipital craniotomy with underlying chronic encephalomalacia. Electronically Signed   By: Franchot Gallo M.D.   On:  09/02/2020 15:32   US RENAL  Result Date: 09/02/2020 CLINICAL DATA:  Acute renal injury. EXAM: RENAL / URINARY TRACT ULTRASOUND COMPLETE COMPARISON:  X-ray abdomen 09/02/2020 FINDINGS: Right Kidney: Renal measurements: 9.4 x 3.7 x 3.9 cm = volume: 69 mL mL. Mild echogenicity increased. No mass or hydronephrosis visualized. Left Kidney: Renal measurements: 8.9 x 4.8 x 4 cm = volume: 88 mL mL. Mild echogenicity increased no mass or hydronephrosis visualized. Urinary: Appears normal for degree of bladder distention. Other: None. IMPRESSION: 1. 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 2. Slightly increased echogenicity of bilateral kidneys suggestive of renal parenchymal disease. Electronically Signed   By: Iven Finn M.D.   On: 09/02/2020 18:23   ECHOCARDIOGRAM COMPLETE  Result Date: 09/02/2020    ECHOCARDIOGRAM REPORT   Patient Name:   SAHNNON DEMSKE Date of Exam: 09/02/2020 Medical Rec #:  [id number]       Height:       60.0 in Accession #:    [id number]      Weight:       143.3 lb Date of Birth:  August 09, 1960        BSA:          1.620 m Patient Age:    52 years        BP:           162/78 mmHg Patient Gender: F               HR:           77 bpm. Exam Location:  Inpatient Procedure: 2D Echo, Cardiac Doppler, Color Doppler and Intracardiac            Opacification Agent Indications:    Stroke I63.9  History:        Patient has no prior history of Echocardiogram examinations.                 Risk Factors:Hypertension and Diabetes. Edema. Acute                 encephalopathy. Acute kidney injury.  Sonographer:    Darlina Sicilian RDCS Referring Phys: [id number] Candace Gallus MELVIN  Sonographer Comments: Technically difficult study due to poor echo windows and  suboptimal apical window. IMPRESSIONS  1. Left ventricular ejection fraction, by estimation, is 65%. The left ventricle has normal function. The left ventricle has no regional wall motion abnormalities. There is severe asymmetric left  ventricular hypertrophy of the septal segment. Left ventricular diastolic parameters are consistent with Grade I diastolic dysfunction (impaired relaxation).  2. Right ventricular systolic function is normal. The right ventricular size is normal. Tricuspid regurgitation signal is inadequate for assessing PA pressure.  3. Julie-Ann Vanmaanen small pericardial effusion is present.  4. The mitral valve is normal in structure. No evidence of mitral valve regurgitation. No evidence of mitral stenosis.  5. The aortic valve is tricuspid. Aortic valve regurgitation is not visualized. No aortic stenosis is present.  6. The inferior vena cava is normal in size with greater than 50% respiratory variability, suggesting right atrial pressure of 3 mmHg. Conclusion(s)/Recommendation(s): No intracardiac source of embolism detected on this transthoracic study. Lynde Ludwig transesophageal echocardiogram is recommended to exclude cardiac source of embolism if clinically indicated particularly in setting of suboptimal apical window. FINDINGS  Left Ventricle: Left ventricular ejection fraction, by estimation, is 65%. The left ventricle has normal function. The left ventricle has no regional wall motion abnormalities. Definity contrast agent was given IV to delineate the left ventricular endocardial borders. The left ventricular internal cavity size was normal in size. 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 Left ventricular diastolic parameters are consistent with Grade I diastolic dysfunction (impaired relaxation). Right Ventricle: The right ventricular size is normal. No increase in right ventricular wall thickness. Right ventricular systolic function is normal. Tricuspid regurgitation signal is inadequate for assessing PA pressure. Left Atrium: Left atrial size was normal in size. Right Atrium: Right atrial size was normal in size. Pericardium: Giacomo Valone small pericardial effusion is present. Mitral Valve: The mitral valve is  normal in structure. No evidence of mitral valve regurgitation. No evidence of mitral valve stenosis. Tricuspid Valve: The tricuspid valve is normal in structure. Tricuspid valve regurgitation is trivial. No evidence of tricuspid stenosis. Aortic Valve: The aortic valve is tricuspid. Aortic valve regurgitation is not visualized. No aortic stenosis is present. Pulmonic Valve: The pulmonic valve was normal in structure. Pulmonic valve regurgitation is trivial. No evidence of pulmonic stenosis. Aorta: The aortic root is normal in size and structure. Venous: The inferior vena cava is normal in size with greater than 50% respiratory variability, suggesting right atrial pressure of 3 mmHg. IAS/Shunts: No atrial level shunt detected by color flow Doppler.  LEFT VENTRICLE PLAX 2D LVIDd:         4.10 cm  Diastology LVIDs:         2.20 cm  LV e' medial:    4.54 cm/s LV PW:         0.90 cm  LV E/e' medial:  14.4 LV IVS:        1.50 cm  LV e' lateral:   5.48 cm/s LVOT diam:     1.90 cm  LV E/e' lateral: 11.9 LV SV:         49 LV SV Index:   30 LVOT Area:     2.84 cm  RIGHT VENTRICLE TAPSE (M-mode): 1.8 cm LEFT ATRIUM             Index LA diam:        4.00 cm 2.47 cm/m LA Vol (A2C):   15.7 ml 9.69 ml/m LA Vol (A4C):   23.4 ml 14.44 ml/m LA Biplane Vol: 19.3 ml 11.91 ml/m  AORTIC VALVE LVOT Vmax:   94.20 cm/s LVOT Vmean:  61.300 cm/s LVOT VTI:    0.174 m  AORTA Ao Root diam: 3.10 cm Ao Asc diam:  2.80 cm MITRAL VALVE MV Area (PHT): 3.34 cm    SHUNTS MV Decel Time: 227 msec    Systemic VTI:  0.17 m MV E velocity: 65.45 cm/s  Systemic Diam: 1.90 cm MV Merlene Dante velocity: 84.35 cm/s MV E/Pennelope Basque ratio:  0.78 Cherlynn Kaiser MD Electronically signed by Cherlynn Kaiser MD Signature Date/Time: 09/02/2020/12:02:50 PM    Final    VAS US CAROTID (at [facility] and [facility] only)  Result Date: 09/02/2020 Carotid Arterial Duplex Study Patient Name:  RUWEYDA KAAS  Date of Exam:   09/02/2020 Medical Rec #: [id number]        Accession #:    [id number] Date of  Birth: 08/25/1960         Patient Gender: F Patient Age:   38 years Exam Location:  [facility] Procedure:      VAS US CAROTID Referring Phys: Sheppard Coil MELVIN --------------------------------------------------------------------------------  Indications:       CVA. Risk Factors:      Hypertension, Diabetes. Comparison Study:  No prior studies. Performing Technologist: Darlin Coco RDMS, RVT  Examination Guidelines: Duru Reiger complete evaluation includes B-mode imaging, spectral Doppler, color Doppler, and power Doppler as needed of all accessible portions of each vessel. Bilateral testing is considered an integral part of Haley Roza complete examination. Limited examinations for reoccurring indications may be performed as noted.  Right Carotid Findings: +----------+--------+--------+--------+----------------------------+--------+           PSV cm/sEDV cm/sStenosisPlaque Description          Comments +----------+--------+--------+--------+----------------------------+--------+ CCA Prox  99      10                                                   +----------+--------+--------+--------+----------------------------+--------+ CCA Distal98      14                                                   +----------+--------+--------+--------+----------------------------+--------+ ICA Prox  60      14      1-39%   hyperechoic and heterogenous         +----------+--------+--------+--------+----------------------------+--------+ ICA Distal72      27                                                   +----------+--------+--------+--------+----------------------------+--------+ ECA       119     9                                                    +----------+--------+--------+--------+----------------------------+--------+ +----------+--------+-------+----------------+-------------------+           PSV cm/sEDV cmsDescribe        Arm Pressure (mmHG)  +----------+--------+-------+----------------+-------------------+ Subclavian170  Multiphasic, WNL                    +----------+--------+-------+----------------+-------------------+ +---------+--------+--+--------+--+---------+ VertebralPSV cm/s78EDV cm/s35Antegrade +---------+--------+--+--------+--+---------+  Left Carotid Findings: +----------+--------+--------+--------+------------------+--------+           PSV cm/sEDV cm/sStenosisPlaque DescriptionComments +----------+--------+--------+--------+------------------+--------+ CCA Prox  129     15                                         +----------+--------+--------+--------+------------------+--------+ CCA Distal86      13                                         +----------+--------+--------+--------+------------------+--------+ ICA Prox  84      29      1-39%   heterogenous               +----------+--------+--------+--------+------------------+--------+ ICA Distal71      21                                         +----------+--------+--------+--------+------------------+--------+ ECA       72      12                                         +----------+--------+--------+--------+------------------+--------+ +----------+--------+--------+----------------+-------------------+           PSV cm/sEDV cm/sDescribe        Arm Pressure (mmHG) +----------+--------+--------+----------------+-------------------+ [id number]             Multiphasic, WNL                    +----------+--------+--------+----------------+-------------------+ +---------+--------+--+--------+--+---------+ VertebralPSV cm/s72EDV cm/s15Antegrade +---------+--------+--+--------+--+---------+   Summary: Right Carotid: Velocities in the right ICA are consistent with Jazier Mcglamery 1-39% stenosis. Left Carotid: Velocities in the left ICA are consistent with Chosen Garron 1-39% stenosis. Vertebrals:  Bilateral vertebral arteries demonstrate  antegrade flow. Subclavians: Normal flow hemodynamics were seen in bilateral subclavian              arteries. *See table(s) above for measurements and observations.  Electronically signed by Antony Contras MD on 09/02/2020 at 3:15:47 PM.    Final         Scheduled Meds:  aspirin EC  81 mg Oral Daily   atorvastatin  40 mg Oral Daily   enoxaparin (LOVENOX) injection  30 mg Subcutaneous Q24H   insulin aspart  0-5 Units Subcutaneous QHS   insulin aspart  0-9 Units Subcutaneous TID WC   insulin aspart  3 Units Subcutaneous TID WC   insulin detemir  10 Units Subcutaneous Daily   [START ON 09/09/2020] thiamine injection  100 mg Intravenous Daily   Continuous Infusions:  lactated ringers 125 mL/hr at 09/03/20 0557   thiamine injection 500 mg (09/03/20 0559)   Followed by   Derrill Memo ON 09/04/2020] thiamine injection       LOS: 1 day    Time spent: over 58 min    Fayrene Helper, MD [facility]   To contact the attending provider between 7A-7P or the covering provider during after hours 7P-7A, please log into the web site [email or web address] and  access using universal [place] password for that web site. If you do not have the password, please call the hospital operator.  09/03/2020, 10:11 AM

## 2020-09-04 ENCOUNTER — Inpatient Hospital Stay (HOSPITAL_COMMUNITY): Payer: 59

## 2020-09-04 LAB — URINALYSIS, COMPLETE (UACMP) WITH MICROSCOPIC
Bacteria, UA: NONE SEEN
Bilirubin Urine: NEGATIVE
Glucose, UA: >=500 mg/dL — AB
Hgb urine dipstick: NEGATIVE
Ketones, ur: NEGATIVE mg/dL
Leukocytes,Ua: NEGATIVE
Nitrite: NEGATIVE
Protein, ur: 100 mg/dL — AB
Specific Gravity, Urine: 1.014 (ref 1.005–1.030)
pH: 6 (ref 5.0–8.0)

## 2020-09-04 LAB — CK: Total CK: 120 U/L (ref 38–234)

## 2020-09-04 LAB — GLUCOSE, CAPILLARY
Glucose-Capillary: 120 mg/dL — ABNORMAL HIGH (ref 70–99)
Glucose-Capillary: 157 mg/dL — ABNORMAL HIGH (ref 70–99)
Glucose-Capillary: 159 mg/dL — ABNORMAL HIGH (ref 70–99)
Glucose-Capillary: 222 mg/dL — ABNORMAL HIGH (ref 70–99)
Glucose-Capillary: 235 mg/dL — ABNORMAL HIGH (ref 70–99)
Glucose-Capillary: 284 mg/dL — ABNORMAL HIGH (ref 70–99)
Glucose-Capillary: 293 mg/dL — ABNORMAL HIGH (ref 70–99)
Glucose-Capillary: 361 mg/dL — ABNORMAL HIGH (ref 70–99)

## 2020-09-04 LAB — BASIC METABOLIC PANEL
Anion gap: 4 — ABNORMAL LOW (ref 5–15)
BUN: 29 mg/dL — ABNORMAL HIGH (ref 6–20)
CO2: 28 mmol/L (ref 22–32)
Calcium: 8.1 mg/dL — ABNORMAL LOW (ref 8.9–10.3)
Chloride: 103 mmol/L (ref 98–111)
Creatinine, Ser: 2.42 mg/dL — ABNORMAL HIGH (ref 0.44–1.00)
GFR, Estimated: 23 mL/min — ABNORMAL LOW (ref >60)
Glucose, Bld: 226 mg/dL — ABNORMAL HIGH (ref 70–99)
Potassium: 3.9 mmol/L (ref 3.5–5.1)
Sodium: 135 mmol/L (ref 135–145)

## 2020-09-04 LAB — COMPREHENSIVE METABOLIC PANEL
ALT: 14 U/L (ref 0–44)
AST: 17 U/L (ref 15–41)
Albumin: 2 g/dL — ABNORMAL LOW (ref 3.5–5.0)
Alkaline Phosphatase: 71 U/L (ref 38–126)
Anion gap: 5 (ref 5–15)
BUN: 29 mg/dL — ABNORMAL HIGH (ref 6–20)
CO2: 28 mmol/L (ref 22–32)
Calcium: 8.5 mg/dL — ABNORMAL LOW (ref 8.9–10.3)
Chloride: 99 mmol/L (ref 98–111)
Creatinine, Ser: 2.76 mg/dL — ABNORMAL HIGH (ref 0.44–1.00)
GFR, Estimated: 19 mL/min — ABNORMAL LOW (ref >60)
Glucose, Bld: 329 mg/dL — ABNORMAL HIGH (ref 70–99)
Potassium: 4.5 mmol/L (ref 3.5–5.1)
Sodium: 132 mmol/L — ABNORMAL LOW (ref 135–145)
Total Bilirubin: 0.6 mg/dL (ref 0.3–1.2)
Total Protein: 4.5 g/dL — ABNORMAL LOW (ref 6.5–8.1)

## 2020-09-04 LAB — CBC WITH DIFFERENTIAL/PLATELET
Abs Immature Granulocytes: 0.06 10*3/uL (ref 0.00–0.07)
Basophils Absolute: 0 10*3/uL (ref 0.0–0.1)
Basophils Relative: 0 %
Eosinophils Absolute: 0.1 10*3/uL (ref 0.0–0.5)
Eosinophils Relative: 1 %
HCT: 31.1 % — ABNORMAL LOW (ref 36.0–46.0)
Hemoglobin: 10.3 g/dL — ABNORMAL LOW (ref 12.0–15.0)
Immature Granulocytes: 1 %
Lymphocytes Relative: 39 %
Lymphs Abs: 3.5 10*3/uL (ref 0.7–4.0)
MCH: 27 pg (ref 26.0–34.0)
MCHC: 33.1 g/dL (ref 30.0–36.0)
MCV: 81.6 fL (ref 80.0–100.0)
Monocytes Absolute: 0.7 10*3/uL (ref 0.1–1.0)
Monocytes Relative: 8 %
Neutro Abs: 4.5 10*3/uL (ref 1.7–7.7)
Neutrophils Relative %: 51 %
Platelets: 263 10*3/uL (ref 150–400)
RBC: 3.81 MIL/uL — ABNORMAL LOW (ref 3.87–5.11)
RDW: 12.6 % (ref 11.5–15.5)
WBC: 8.8 10*3/uL (ref 4.0–10.5)
nRBC: 0 % (ref 0.0–0.2)

## 2020-09-04 LAB — CREATININE, URINE, RANDOM: Creatinine, Urine: 79.69 mg/dL

## 2020-09-04 LAB — PHOSPHORUS: Phosphorus: 3.4 mg/dL (ref 2.5–4.6)

## 2020-09-04 LAB — SODIUM, URINE, RANDOM: Sodium, Ur: 60 mmol/L

## 2020-09-04 LAB — MAGNESIUM: Magnesium: 1.8 mg/dL (ref 1.7–2.4)

## 2020-09-04 IMAGING — DX DG CHEST 1V PORT
1 series · 1 of 1 positions shown · non-contrast
Comparison: Chest x-ray dated [DATE].

CLINICAL DATA: Altered mental status, seizure.

EXAM:
PORTABLE CHEST 1 VIEW

[chest ap]
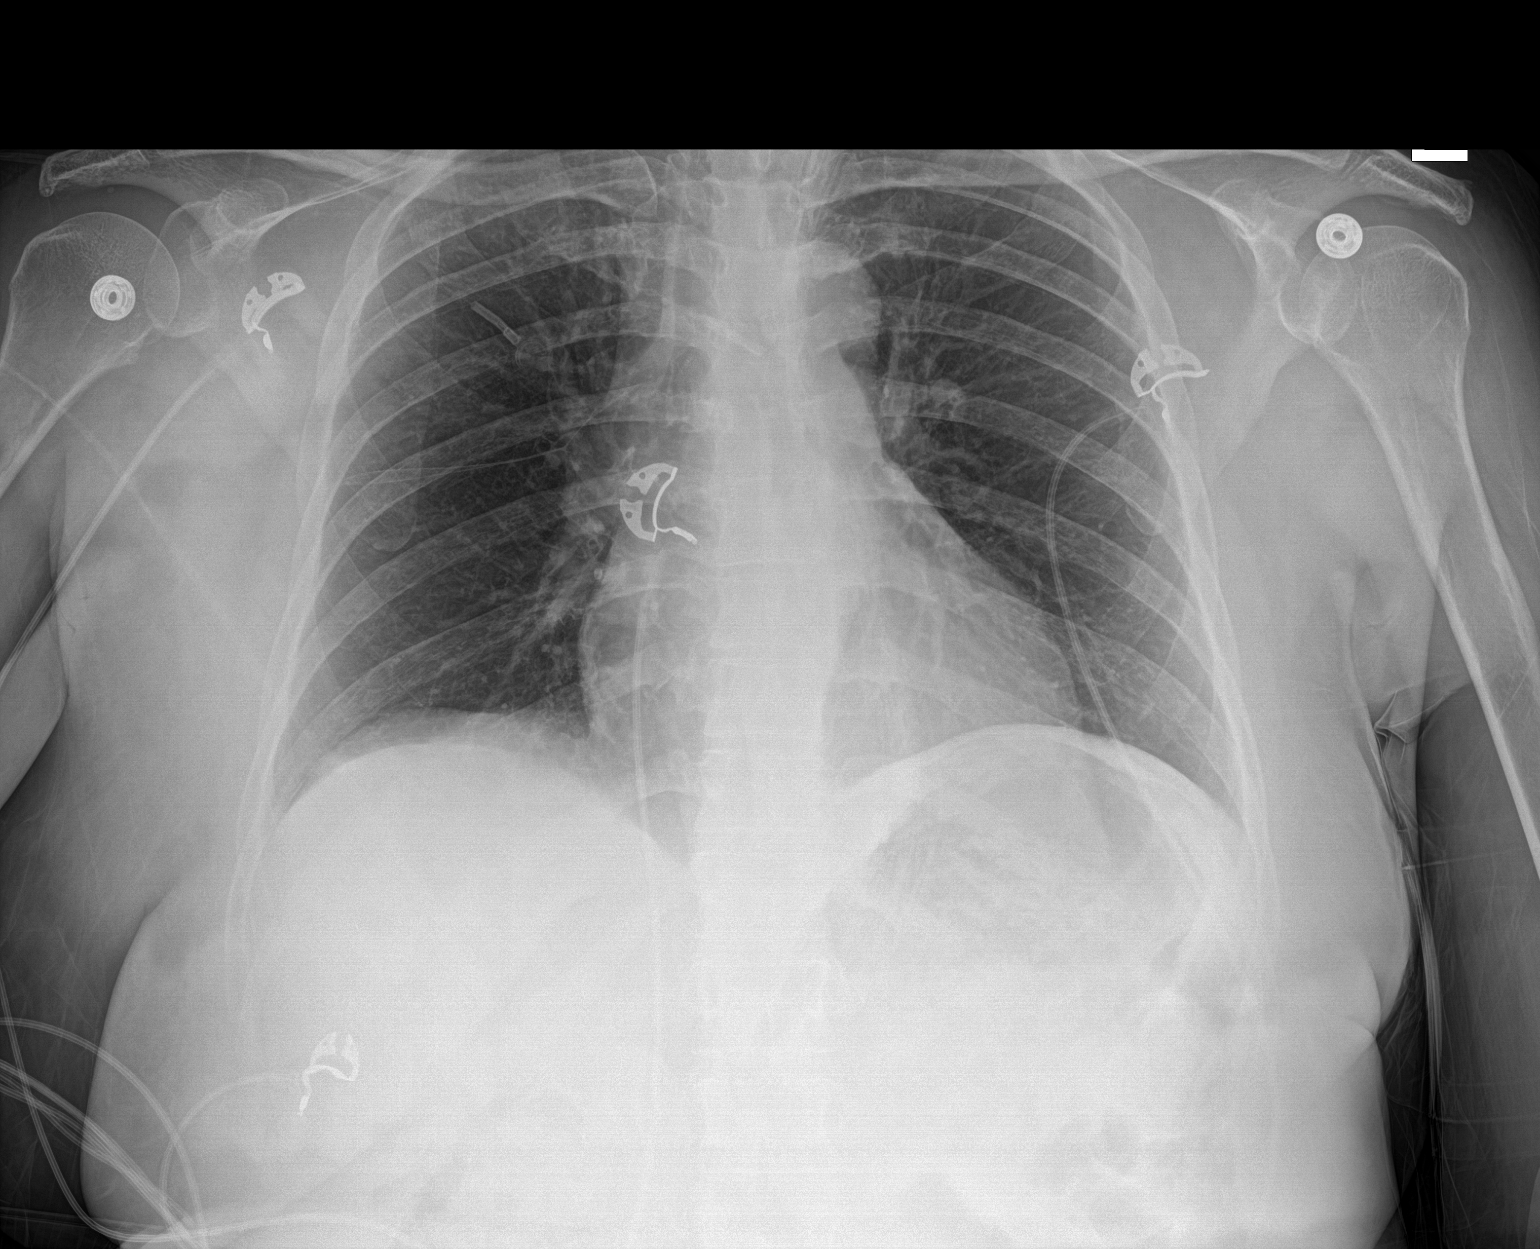

[1 of 1 positions shown; findings below may reference images not displayed]

FINDINGS: Heart size and mediastinal contours are within normal limits. Lungs
are clear. No pleural effusion or pneumothorax is seen. Osseous
structures about the chest are unremarkable.
IMPRESSION: No active disease. No evidence of pneumonia or pulmonary edema.

## 2020-09-04 MED ORDER — DIVALPROEX SODIUM 250 MG PO DR TAB
1000.0000 mg | DELAYED_RELEASE_TABLET | Freq: Once | ORAL | Status: AC
  Administered 2020-09-04: 1000 mg via ORAL
  Filled 2020-09-04: qty 4

## 2020-09-04 MED ORDER — DIVALPROEX SODIUM 250 MG PO DR TAB
250.0000 mg | DELAYED_RELEASE_TABLET | Freq: Three times a day (TID) | ORAL | Status: DC
  Administered 2020-09-04 – 2020-09-07 (×9): 250 mg via ORAL
  Filled 2020-09-04 (×9): qty 1

## 2020-09-04 MED ORDER — INSULIN ASPART 100 UNIT/ML IJ SOLN
4.0000 [IU] | Freq: Once | INTRAMUSCULAR | Status: AC
  Administered 2020-09-04: 4 [IU] via SUBCUTANEOUS

## 2020-09-04 NOTE — Procedures (Addendum)
Patient Name: Jill Jefferson  MRN: [id number]  Epilepsy Attending: Lora Havens  Referring Physician/Provider: Dr Lesleigh Noe Duration: 09/03/2020 1201 to 09/04/2020 1201   Patient history: 60 year old female presenting with aphasia and confusion in the context of HHS with a blood glucose level of 829. EEG to evaluate for seizure   Level of alertness: Awake, asleep   AEDs during EEG study: LEV   Technical aspects: This EEG study was done with scalp electrodes positioned according to the 10-20 International system of electrode placement. Electrical activity was acquired at a sampling rate of '500Hz'$  and reviewed with a high frequency filter of '70Hz'$  and a low frequency filter of '1Hz'$ . EEG data were recorded continuously and digitally stored.    Description: The posterior dominant rhythm consists of 9 Hz activity of moderate voltage (25-35 uV) seen predominantly in posterior head regions, symmetric and reactive to eye opening and eye closing. Sleep was characterized by vertex waves, sleep spindles (12 to 14 Hz), maximal frontocentral region. Lateralized periodic discharges were noted in left hemisphere, maximal left temporal region, at 1-'2hz'$  which at times appeared rhythmic without definite evolution consistent with brief-ictal-interictal rhythmic discharges. Intermittent rhythmic 3-'5hz'$  theta-delta slowing was also noted in left hemisphere, maximal left temporal region.   Seizure without clinical signs were also noted arising from left hemisphere, maximal left temporal region. At the beginning of study, 3-6 seizures wre noted/hour. Gradually the frequency improved and one seizure was seen every few hours. Average duration of seizures was about 20-40 seconds. During seizure EEG showed 1-1.5 hz periodic sharp waves in left hemisphere, maximal left temporal region which gradually increased in amplitude as well as frequency increased to 2-2.'5hz'$ . There was also 5-'6hz'$  theta slowing which gradually evolved  into 2-'3hz'$  delta slowing.    Hyperventilation and photic stimulation were not performed.      ABNORMALITY - Seizure without clinical signs, left hemisphere, maximal left temporal region  - Brief ictal-interictal rhythmic discharges, left hemisphere, maximal left temporal region  ( BIRDs) - Lateralized periodic discharges,left hemisphere, maximal left temporal region  (LPD) - Intermittent slow, left hemisphere, maximal left temporal region  (   IMPRESSION: This study showed seizure without clinical signs arising from left hemisphere, maximal left temporal region. t the beginning of study, 3-6 seizures wre noted/hour. Gradually the frequency improved and one seizure was seen every few hours.  Average duration of seizures was about 20-40 seconds. Brief ictal-interictal rhythmic discharges were also noted left hemisphere, maximal left temporal region which are suggestive of high potential for seizure recurrence. Additionally, there is evidence of cortical dysfunction in left hemisphere, maximal left temporal region most likely due to underlying structural abnormality/stroke and seizure.    Ashleen Demma Barbra Sarks

## 2020-09-04 NOTE — Progress Notes (Signed)
LTM maint complete - no skin breakdown under:  electrode site FP1 FP2 continue to monitor

## 2020-09-04 NOTE — Progress Notes (Signed)
PROGRESS NOTE    Jill Jefferson  [id number] DOB: July 01, 1960 DOA: 09/01/2020 PCP: Jill Glatter, FNP (Inactive)   Chief Complaint  Patient presents with   Altered Mental Status   Brief Narrative:  Jill Jefferson is Jill Jefferson 60 y.o. female with medical history significant of cataracts, edema, diabetes, hypertension, H. pylori presents with altered mental status.  Patient presented altered and oriented to year and self only.  Unable to fully participate in history.  Additional history was obtained with assistance of chart review and family.  Her sister states that her hairstylist called earlier today because the patient canceled her appointment I did not sound right on the phone.  Another family member went to evaluate the patient and noted that she was talking strange and that she was speaking unintelligibly in both [place] and [place].   For me she is intermittently aphasic able to answer some questions appropriately and follows most commands appropriately.  At times when trying to discuss her difficulty expressing herself she repeats "something going on, something going on".   Patient denies fevers, chills, chest pain, shortness of breath, abdominal pain, constipation, diarrhea, nausea, vomiting.    ED Course: Vital signs in the ED significant for blood pressure initially in the 200s now in the 120s with intervention.  Initially tachycardic but now not.  Lab work-up showed BMP with sodium 129 but this corrects to normal considering glucose of 829.  Chloride 93, creatinine elevated 2.52 from baseline of around 1.6.  Calcium 8.8.  LFTs pending.  CBC within normal limits.  Lactic acid 2.5 with repeat pending.  Serum osmolality elevated at 322.  BNP normal.  Respiratory panel for flu and COVID-negative.  Urine analysis showed small hemoglobin and protein.  Ethanol level and UDS are pending.  Chest x-ray showed no acute normalities.  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  Patient given Jill Jefferson dose of amlodipine, hydrochlorothiazide and labetalol in the ED.  Also started on insulin drip given 2 L of IV fluids and 2 doses of 10 mEq IV potassium.  Assessment & Plan:   Principal Problem:   Hyperosmolar hyperglycemic state (HHS) ([name]) Active Problems:   Diabetes mellitus ([name])   Bilateral lower extremity edema   Focal neurological deficit   AKI (acute kidney injury) (Jill Jefferson)   Acute metabolic encephalopathy  Acute encephalopathy  Seizures >metabolic encephalopathy and seizures in setting of HHS as this has been improving per EDP. > No focal deficits on exam, but issues with word finding and following commands.  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 > MRI without acute abnormality, L occipital craniotomy with underlying chronic encephalomalacia > no major branch occlusion or significant proximal stenosis on MRA - Neurology consult - EEG with seizures, follow with keppra, follow LTM EEG - RPR + with titer 1:2, notes she's been treating many times in the past - MRA head without major branch occlusion or significant proximal stenosis - ammonia wnl,high dose thiamine - ASA, Continue Statin  - Echocardiogram - with EF 65%, no RWMA, severe asymmetric LV hypertrophy, grade I diastolic dysfunction (no intracardiac source of embolism - consider TEE if clinically indicated - defer to neurology) - 1-39%, bilateral vertebral arteries  -  A1C pending, Lipid panel (LDL elevated) -> will increase to 40 mg  - Tele monitoring  - SLP/PT/OT - follow TSH (wnl), B12 (wnl), folate (wnl), VBG (no hypercarbia), RPR (+ 1:2), ammonia wnl - UA  not c/w UTI, CXR without evidence of acute cardiopulm process   Per [place] statutes, patients with seizures are not allowed to drive until  they have been seizure-free for six months. Use caution when using heavy equipment or power tools. Avoid working on ladders or at  heights. Take showers instead of baths. Ensure the water temperature is not too high on the home water heater. Do not go swimming alone. When caring for infants or small children, sit down when holding, feeding, or changing them to minimize risk of injury to the child in the event you have Jill Jefferson seizure. Also, Maintain good sleep hygiene. Avoid alcohol.  HHS > Patient presenting altered as above.  Found to have glucose in the 800s.  Serum osmolality 322.  Mildly elevated lactic acid 2.5 with Jill Jefferson creatinine of 2.5 elevated from baseline of 1.6. - he's off insulin gtt now - continue levemir, SSI - BG"s improved, continue to monitor - carb mod   AKI > Creatinine elevated to 2.52 from baseline around 1.6. - initially improved, but now worsening - 2.76 -> repeat UA, follow urine lytes, bladder scan  - net + 6 L -> follow CXR, doesn't appear overloaded - UA with 100 mg/dl protein, follow renal US -> atrophic L kidney and borderline atrophic right kidney   Hypertension > BP initially elevated to Jill Jefferson systolic in the ED now improved to the 120s with multiple interventions in the ED. - We will hold off on additional interventions while awaiting MRI brain to further evaluate her mentation.   Hypokalemia - improved  + RPR - reports hx of treatment   DVT prophylaxis: lovenox Code Status: full  Family Communication: sister over phone 8/26 Disposition:   Status is: Observation  The patient will require care spanning > 2 midnights and should be moved to inpatient because: Inpatient level of care appropriate due to severity of illness  Dispo: The patient is from: Home              Anticipated d/c is to: Home              Patient currently is not medically stable to d/c.   Difficult to place patient No       Consultants:  neurology  Procedures:  Carotid US Summary:  Right Carotid: Velocities in the right ICA are consistent with Jill Jefferson 1-39%  stenosis.   Left Carotid: Velocities in the left ICA  are consistent with Jill Jefferson 1-39%  stenosis.   Vertebrals:  Bilateral vertebral arteries demonstrate antegrade flow.  Subclavians: Normal flow hemodynamics were seen in bilateral subclavian               arteries.   Echo IMPRESSIONS     1. Left ventricular ejection fraction, by estimation, is 65%. The left  ventricle has normal function. The left ventricle has no regional wall  motion abnormalities. There is severe asymmetric left ventricular  hypertrophy of the septal segment. Left  ventricular diastolic parameters are consistent with Grade I diastolic  dysfunction (impaired relaxation).   2. Right ventricular systolic function is normal. The right ventricular  size is normal. Tricuspid regurgitation signal is inadequate for assessing  PA pressure.   3. Mauri Temkin small pericardial effusion is present.   4. The mitral valve is normal in structure. No evidence of mitral valve  regurgitation. No evidence of mitral stenosis.   5. The aortic valve is tricuspid. Aortic valve regurgitation is not  visualized. No aortic stenosis is present.  6. The inferior vena cava is normal in size with greater than 50%  respiratory variability, suggesting right atrial pressure of 3 mmHg.   Conclusion(s)/Recommendation(s): No intracardiac source of embolism  detected on this transthoracic study. Vi Biddinger transesophageal echocardiogram is  recommended to exclude cardiac source of embolism if clinically indicated  particularly in setting of suboptimal  apical window.   Antimicrobials:  Anti-infectives (From admission, onward)    None          Subjective: Denies any new complaints  Objective: Vitals:   09/03/20 1652 09/03/20 1949 09/03/20 2353 09/04/20 0339  BP: (!) 149/78 130/67 (!) 180/83 140/86  Pulse: 89 84 84 75  Resp:  '20 16 16  '$ Temp: 98.7 F (37.1 C) 98.2 F (36.8 C) 99.2 F (37.3 C) 98.6 F (37 C)  TempSrc: Oral Oral Oral Oral  SpO2: 100% 99% 98% 100%  Weight:      Height:         Intake/Output Summary (Last 24 hours) at 09/04/2020 0856 Last data filed at 09/04/2020 0743 Gross per 24 hour  Intake 4957.04 ml  Output --  Net 4957.04 ml   Filed Weights   09/01/20 2326  Weight: 65 kg    Examination:  General: No acute distress. Cardiovascular: RRR. Lungs: unlabored Abdomen: Soft, nontender, nondistended Neurological: Alert and oriented 3 - more fluent speech today. Moves all extremities 4. Cranial nerves II through XII grossly intact. Skin: Warm and dry. No rashes or lesions. Extremities: No clubbing or cyanosis. No edema.   Data Reviewed: I have personally reviewed following labs and imaging studies  CBC: Recent Labs  Lab 09/01/20 1450 09/01/20 1506 09/03/20 0145 09/04/20 0110  WBC 9.9  --  9.3 8.8  NEUTROABS 8.1*  --  4.8 4.5  HGB 12.8 13.6 11.3* 10.3*  HCT 38.3 40.0 32.6* 31.1*  MCV 80.6  --  78.4* 81.6  PLT 296  --  296 [id number]    Basic Metabolic Panel: Recent Labs  Lab 09/01/20 2252 09/02/20 0630 09/02/20 1330 09/03/20 0145 09/04/20 0110  NA 138 136 131* 135 132*  K 2.8* 2.6* 3.4* 3.5 4.5  CL 102 100 96* 102 99  CO2 '28 29 29 27 28  '$ GLUCOSE 134* 133* 258* 109* 329*  BUN '16 13 10 17 '$ 29*  CREATININE 1.98* 1.89* 1.87* 2.22* 2.76*  CALCIUM 8.9 8.7* 8.6* 8.7* 8.5*  MG  --  1.6*  --  2.0 1.8  PHOS  --   --   --   --  3.4    GFR: Estimated Creatinine Clearance: 18.7 mL/min (Morgane Joerger) (by C-G formula based on SCr of 2.76 mg/dL (H)).  Liver Function Tests: Recent Labs  Lab 09/01/20 2252 09/03/20 0145 09/04/20 0110  AST '18 20 17  '$ ALT '17 15 14  '$ ALKPHOS 78 66 71  BILITOT 0.6 0.7 0.6  PROT 5.2* 4.7* 4.5*  ALBUMIN 2.6* 2.2* 2.0*    CBG: Recent Labs  Lab 09/03/20 1511 09/03/20 1952 09/04/20 0004 09/04/20 0347 09/04/20 0658  GLUCAP 195* 194* 361* 159* 120*     Recent Results (from the past 240 hour(s))  Resp Panel by RT-PCR (Flu Alistair Senft&B, Covid) Nasopharyngeal Swab     Status: None   Collection Time: 09/01/20  3:27 PM    Specimen: Nasopharyngeal Swab; Nasopharyngeal(NP) swabs in vial transport medium  Result Value Ref Range Status   SARS Coronavirus 2 by RT PCR NEGATIVE NEGATIVE Final    Comment: (NOTE) SARS-CoV-2 target nucleic acids are NOT DETECTED.  The  SARS-CoV-2 RNA is generally detectable in upper respiratory specimens during the acute phase of infection. The lowest concentration of SARS-CoV-2 viral copies this assay can detect is 138 copies/mL. Ashwath Lasch negative result does not preclude SARS-Cov-2 infection and should not be used as the sole basis for treatment or other patient management decisions. Sache Sane negative result may occur with  improper specimen collection/handling, submission of specimen other than nasopharyngeal swab, presence of viral mutation(s) within the areas targeted by this assay, and inadequate number of viral copies(<138 copies/mL). Madalina Rosman negative result must be combined with clinical observations, patient history, and epidemiological information. The expected result is Negative.  Fact Sheet for Patients:  EntrepreneurPulse.com.au  Fact Sheet for Healthcare Providers:  IncredibleEmployment.be  This test is no t yet approved or cleared by the [place] FDA and  has been authorized for detection and/or diagnosis of SARS-CoV-2 by FDA under an Emergency Use Authorization (EUA). This EUA will remain  in effect (meaning this test can be used) for the duration of the COVID-19 declaration under Section 564(b)(1) of the Act, 21 U.S.C.section 360bbb-3(b)(1), unless the authorization is terminated  or revoked sooner.       Influenza Keylon Labelle by PCR NEGATIVE NEGATIVE Final   Influenza B by PCR NEGATIVE NEGATIVE Final    Comment: (NOTE) The Xpert Xpress SARS-CoV-2/FLU/RSV plus assay is intended as an aid in the diagnosis of influenza from Nasopharyngeal swab specimens and should not be used as Jamesyn Lindell sole basis for treatment. Nasal washings and aspirates are  unacceptable for Xpert Xpress SARS-CoV-2/FLU/RSV testing.  Fact Sheet for Patients: EntrepreneurPulse.com.au  Fact Sheet for Healthcare Providers: IncredibleEmployment.be  This test is not yet approved or cleared by the [place] FDA and has been authorized for detection and/or diagnosis of SARS-CoV-2 by FDA under an Emergency Use Authorization (EUA). This EUA will remain in effect (meaning this test can be used) for the duration of the COVID-19 declaration under Section 564(b)(1) of the Act, 21 U.S.C. section 360bbb-3(b)(1), unless the authorization is terminated or revoked.  Performed at [facility], [place] [address], [place],  [address]          Radiology Studies: DG Abd 1 View  Result Date: 09/02/2020 CLINICAL DATA:  60 year old female with constipation. EXAM: ABDOMEN - 1 VIEW COMPARISON:  None. FINDINGS: The bowel gas pattern is normal. No radio-opaque calculi or other significant radiographic abnormality are seen. IMPRESSION: Nonobstructive bowel gas pattern. Electronically Signed   By: Ruthann Cancer M.D.   On: 09/02/2020 12:53   MR ANGIO HEAD WO CONTRAST  Result Date: 09/03/2020 CLINICAL DATA:  TIA. EXAM: MRA HEAD WITHOUT CONTRAST TECHNIQUE: Angiographic images of the Circle of Willis were acquired using MRA technique without intravenous contrast. COMPARISON:  No pertinent prior exam. FINDINGS: Anterior circulation: The internal carotid arteries are widely patent from skull base to carotid termini. ACAs and MCAs are patent without evidence of Fawnda Vitullo proximal branch occlusion or significant proximal stenosis. The right A1 segment is hypoplastic. No aneurysm is identified. Posterior circulation: The included intracranial portions of the vertebral arteries are widely patent to the basilar and codominant. Patent AICA and SCA origins are seen bilaterally. The basilar artery is widely patent. There is Pallavi Clifton subtly bulbous configuration of  the distal basilar artery with the appearance on reformats suggestive of Perry Molla small fenestration (Kijuana Ruppel normal variant). Beverlyann Broxterman discrete aneurysm is not evident. There are right larger than left posterior communicating arteries with hypoplasia of the right P1 segment. Both PCAs are patent with mild irregularity but  no flow limiting proximal stenosis. Anatomic variants: Hypoplastic right A1 segment. Predominantly fetal type origin of the right PCA. Distal basilar artery fenestration. IMPRESSION: No major branch occlusion or significant proximal stenosis. Electronically Signed   By: Logan Bores M.D.   On: 09/03/2020 10:03   MR BRAIN WO CONTRAST  Result Date: 09/02/2020 CLINICAL DATA:  Acute neuro deficit.  Altered mental status EXAM: MRI HEAD WITHOUT CONTRAST TECHNIQUE: Multiplanar, multiecho pulse sequences of the brain and surrounding structures were obtained without intravenous contrast. COMPARISON:  CT head 09/01/2020 FINDINGS: Brain: Ventricle size and cerebral volume normal. Enlarged sella filled with CSF. Probable empty sella which is likely an incidental finding. Chronic encephalomalacia left occipital lobe with overlying craniotomy. No mass-effect or edema. Negative for acute infarct chronic blood products in the left occipital encephalomalacia. Vascular: Normal arterial flow voids Skull and upper cervical spine: Left occipital craniotomy Sinuses/Orbits: Mucosal edema paranasal sinuses. Right cataract extraction Other: None IMPRESSION: No acute abnormality Left occipital craniotomy with underlying chronic encephalomalacia. Electronically Signed   By: Franchot Gallo M.D.   On: 09/02/2020 15:32   US RENAL  Result Date: 09/02/2020 CLINICAL DATA:  Acute renal injury. EXAM: RENAL / URINARY TRACT ULTRASOUND COMPLETE COMPARISON:  X-ray abdomen 09/02/2020 FINDINGS: Right Kidney: Renal measurements: 9.4 x 3.7 x 3.9 cm = volume: 69 mL mL. Mild echogenicity increased. No mass or hydronephrosis visualized. Left Kidney:  Renal measurements: 8.9 x 4.8 x 4 cm = volume: 88 mL mL. Mild echogenicity increased no mass or hydronephrosis visualized. Urinary: Appears normal for degree of bladder distention. Other: None. IMPRESSION: 1. 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 2. Slightly increased echogenicity of bilateral kidneys suggestive of renal parenchymal disease. Electronically Signed   By: Iven Finn M.D.   On: 09/02/2020 18:23   EEG adult  Result Date: 09/03/2020 Lora Havens, MD     09/03/2020  1:43 PM Patient Name: Adyline Grandt MRN: [id number] Epilepsy Attending: Lora Havens Referring Physician/Provider: Dr Kerney Elbe Date: 09/03/2020 Duration: 22.33mns Patient history: 60year old female presenting with aphasia and confusion in the context of HHS with Bernardine Langworthy blood glucose level of 829. EEG to evaluate for seizure Level of alertness: Awake, asleep AEDs during EEG study: None Technical aspects: This EEG study was done with scalp electrodes positioned according to the 10-20 International system of electrode placement. Electrical activity was acquired at Zimri Brennen sampling rate of '500Hz'$  and reviewed with Avigayil Ton high frequency filter of '70Hz'$  and Estus Krakowski low frequency filter of '1Hz'$ . EEG data were recorded continuously and digitally stored. Description: The posterior dominant rhythm consists of 9 Hz activity of moderate voltage (25-35 uV) seen predominantly in posterior head regions, symmetric and reactive to eye opening and eye closing. Sleep was characterized by vertex waves, sleep spindles (12 to 14 Hz), maximal frontocentral region. Lateralized periodic discharges were noted in left hemisphere, maximal left temporal region, at 1-'2hz'$  which at times appeared rhythmic without definite evolution consistent with brief-ictal-interictal rhythmic discharges. Intermittent rhythmic 3-'5hz'$  theta-delta slowing was also noted in left hemisphere, maximal left temporal region. Five seizure without clinical signs were also noted  arising from left hemisphere, maximal left temporal region. First seizure was ongoing at onset of study on 09/03/2020 at 1134. This was followed by seizure at 1143, 1145, 1146 and 1154. Average duration of seizures was about 20-40 seconds. During seizure EEG showed 1-1.5 hz periodic sharp waves in left hemisphere, maximal left temporal region which gradually increased in amplitude as well as frequency increased to  2-2.'5hz'$ . There was also 5-'6hz'$  theta slowing which gradually evolved into 2-'3hz'$  delta slowing. Hyperventilation and photic stimulation were not performed.   ABNORMALITY - Seizure without clinical signs, left hemisphere, maximal left temporal region - Brief ictal-interictal rhythmic discharges, left hemisphere, maximal left temporal region  ( BIRDs) - Lateralized periodic discharges,left hemisphere, maximal left temporal region  (LPD) - Intermittent slow, left hemisphere, maximal left temporal region  ( IMPRESSION: This study showed five seizure without clinical signs arising from left hemisphere, maximal left temporal region. First seizure was ongoing at onset of study on 09/03/2020 at 1134 followed by seizure at 1143, 1145, 1146 and 1154. Average duration of seizures was about 20-40 seconds. Brief ictal-interictal rhythmic discharges were also noted left hemisphere, maximal left temporal region which are suggestive of high potential for seizure recurrence. Additionally, there is evidence of cortical dysfunction in left hemisphere, maximal left temporal region most likely due to underlying structural abnormality/stroke and seizure. Dr Curly Shores was notified. Lora Havens   ECHOCARDIOGRAM COMPLETE  Result Date: 09/02/2020    ECHOCARDIOGRAM REPORT   Patient Name:   KADAJAH WILBOURN Date of Exam: 09/02/2020 Medical Rec #:  [id number]       Height:       60.0 in Accession #:    [id number]      Weight:       143.3 lb Date of Birth:  1960/12/29        BSA:          1.620 m Patient Age:    34 years        BP:            162/78 mmHg Patient Gender: F               HR:           77 bpm. Exam Location:  Inpatient Procedure: 2D Echo, Cardiac Doppler, Color Doppler and Intracardiac            Opacification Agent Indications:    Stroke I63.9  History:        Patient has no prior history of Echocardiogram examinations.                 Risk Factors:Hypertension and Diabetes. Edema. Acute                 encephalopathy. Acute kidney injury.  Sonographer:    Darlina Sicilian RDCS Referring Phys: [id number] Candace Gallus MELVIN  Sonographer Comments: Technically difficult study due to poor echo windows and suboptimal apical window. IMPRESSIONS  1. Left ventricular ejection fraction, by estimation, is 65%. The left ventricle has normal function. The left ventricle has no regional wall motion abnormalities. 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 Left ventricular diastolic parameters are consistent with Grade I diastolic dysfunction (impaired relaxation).  2. Right ventricular systolic function is normal. The right ventricular size is normal. Tricuspid regurgitation signal is inadequate for assessing PA pressure.  3. Cathryn Gallery small pericardial effusion is present.  4. The mitral valve is normal in structure. No evidence of mitral valve regurgitation. No evidence of mitral stenosis.  5. The aortic valve is tricuspid. Aortic valve regurgitation is not visualized. No aortic stenosis is present.  6. The inferior vena cava is normal in size with greater than 50% respiratory variability, suggesting right atrial pressure of 3 mmHg. Conclusion(s)/Recommendation(s): No intracardiac source of embolism detected on this transthoracic study. Zaira Iacovelli transesophageal echocardiogram is recommended to exclude cardiac source of embolism  if clinically indicated particularly in setting of suboptimal apical window. FINDINGS  Left Ventricle: Left ventricular ejection fraction, by estimation, is 65%. The left ventricle has normal function. The  left ventricle has no regional wall motion abnormalities. Definity contrast agent was given IV to delineate the left ventricular endocardial borders. The left ventricular internal cavity size was normal in size. 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 Left ventricular diastolic parameters are consistent with Grade I diastolic dysfunction (impaired relaxation). Right Ventricle: The right ventricular size is normal. No increase in right ventricular wall thickness. Right ventricular systolic function is normal. Tricuspid regurgitation signal is inadequate for assessing PA pressure. Left Atrium: Left atrial size was normal in size. Right Atrium: Right atrial size was normal in size. Pericardium: Irvin Lizama small pericardial effusion is present. Mitral Valve: The mitral valve is normal in structure. No evidence of mitral valve regurgitation. No evidence of mitral valve stenosis. Tricuspid Valve: The tricuspid valve is normal in structure. Tricuspid valve regurgitation is trivial. No evidence of tricuspid stenosis. Aortic Valve: The aortic valve is tricuspid. Aortic valve regurgitation is not visualized. No aortic stenosis is present. Pulmonic Valve: The pulmonic valve was normal in structure. Pulmonic valve regurgitation is trivial. No evidence of pulmonic stenosis. Aorta: The aortic root is normal in size and structure. Venous: The inferior vena cava is normal in size with greater than 50% respiratory variability, suggesting right atrial pressure of 3 mmHg. IAS/Shunts: No atrial level shunt detected by color flow Doppler.  LEFT VENTRICLE PLAX 2D LVIDd:         4.10 cm  Diastology LVIDs:         2.20 cm  LV e' medial:    4.54 cm/s LV PW:         0.90 cm  LV E/e' medial:  14.4 LV IVS:        1.50 cm  LV e' lateral:   5.48 cm/s LVOT diam:     1.90 cm  LV E/e' lateral: 11.9 LV SV:         49 LV SV Index:   30 LVOT Area:     2.84 cm  RIGHT VENTRICLE TAPSE (M-mode): 1.8 cm LEFT ATRIUM              Index LA diam:        4.00 cm 2.47 cm/m LA Vol (A2C):   15.7 ml 9.69 ml/m LA Vol (A4C):   23.4 ml 14.44 ml/m LA Biplane Vol: 19.3 ml 11.91 ml/m  AORTIC VALVE LVOT Vmax:   94.20 cm/s LVOT Vmean:  61.300 cm/s LVOT VTI:    0.174 m  AORTA Ao Root diam: 3.10 cm Ao Asc diam:  2.80 cm MITRAL VALVE MV Area (PHT): 3.34 cm    SHUNTS MV Decel Time: 227 msec    Systemic VTI:  0.17 m MV E velocity: 65.45 cm/s  Systemic Diam: 1.90 cm MV Dorothymae Maciver velocity: 84.35 cm/s MV E/Janisha Bueso ratio:  0.78 Cherlynn Kaiser MD Electronically signed by Cherlynn Kaiser MD Signature Date/Time: 09/02/2020/12:02:50 PM    Final    VAS US CAROTID (at [facility] and [facility] only)  Result Date: 09/02/2020 Carotid Arterial Duplex Study Patient Name:  MARRIAH SHARTLE  Date of Exam:   09/02/2020 Medical Rec #: [id number]        Accession #:    [id number] Date of Birth: 09-23-1960         Patient Gender: F Patient Age:   89 years Exam Location:  [facility] Procedure:      VAS US CAROTID Referring Phys: Sheppard Coil MELVIN --------------------------------------------------------------------------------  Indications:       CVA. Risk Factors:      Hypertension, Diabetes. Comparison Study:  No prior studies. Performing Technologist: Darlin Coco RDMS, RVT  Examination Guidelines: Joah Patlan complete evaluation includes B-mode imaging, spectral Doppler, color Doppler, and power Doppler as needed of all accessible portions of each vessel. Bilateral testing is considered an integral part of Stepheny Canal complete examination. Limited examinations for reoccurring indications may be performed as noted.  Right Carotid Findings: +----------+--------+--------+--------+----------------------------+--------+           PSV cm/sEDV cm/sStenosisPlaque Description          Comments +----------+--------+--------+--------+----------------------------+--------+ CCA Prox  99      10                                                    +----------+--------+--------+--------+----------------------------+--------+ CCA Distal98      14                                                   +----------+--------+--------+--------+----------------------------+--------+ ICA Prox  60      14      1-39%   hyperechoic and heterogenous         +----------+--------+--------+--------+----------------------------+--------+ ICA Distal72      27                                                   +----------+--------+--------+--------+----------------------------+--------+ ECA       119     9                                                    +----------+--------+--------+--------+----------------------------+--------+ +----------+--------+-------+----------------+-------------------+           PSV cm/sEDV cmsDescribe        Arm Pressure (mmHG) +----------+--------+-------+----------------+-------------------+ Subclavian170            Multiphasic, WNL                    +----------+--------+-------+----------------+-------------------+ +---------+--------+--+--------+--+---------+ VertebralPSV cm/s78EDV cm/s35Antegrade +---------+--------+--+--------+--+---------+  Left Carotid Findings: +----------+--------+--------+--------+------------------+--------+           PSV cm/sEDV cm/sStenosisPlaque DescriptionComments +----------+--------+--------+--------+------------------+--------+ CCA Prox  129     15                                         +----------+--------+--------+--------+------------------+--------+ CCA Distal86      13                                         +----------+--------+--------+--------+------------------+--------+ ICA Prox  84      29      1-39%   heterogenous               +----------+--------+--------+--------+------------------+--------+  ICA Distal71      21                                         +----------+--------+--------+--------+------------------+--------+  ECA       72      12                                         +----------+--------+--------+--------+------------------+--------+ +----------+--------+--------+----------------+-------------------+           PSV cm/sEDV cm/sDescribe        Arm Pressure (mmHG) +----------+--------+--------+----------------+-------------------+ [id number]             Multiphasic, WNL                    +----------+--------+--------+----------------+-------------------+ +---------+--------+--+--------+--+---------+ VertebralPSV cm/s72EDV cm/s15Antegrade +---------+--------+--+--------+--+---------+   Summary: Right Carotid: Velocities in the right ICA are consistent with Arneta Mahmood 1-39% stenosis. Left Carotid: Velocities in the left ICA are consistent with Nury Nebergall 1-39% stenosis. Vertebrals:  Bilateral vertebral arteries demonstrate antegrade flow. Subclavians: Normal flow hemodynamics were seen in bilateral subclavian              arteries. *See table(s) above for measurements and observations.  Electronically signed by Antony Contras MD on 09/02/2020 at 3:15:47 PM.    Final         Scheduled Meds:  aspirin EC  81 mg Oral Daily   atorvastatin  40 mg Oral Daily   enoxaparin (LOVENOX) injection  30 mg Subcutaneous Q24H   insulin aspart  0-5 Units Subcutaneous QHS   insulin aspart  0-9 Units Subcutaneous TID WC   insulin aspart  3 Units Subcutaneous TID WC   insulin detemir  10 Units Subcutaneous Daily   [START ON 09/09/2020] thiamine injection  100 mg Intravenous Daily   Continuous Infusions:  lactated ringers 125 mL/hr at 09/04/20 0743   levETIRAcetam Stopped (09/04/20 0744)   thiamine injection       LOS: 2 days    Time spent: over 23 min    Fayrene Helper, MD [facility]   To contact the attending provider between 7A-7P or the covering provider during after hours 7P-7A, please log into the web site [email or web address] and access using universal [place] password for that web site. If you  do not have the password, please call the [facility] operator.  09/04/2020, 8:56 AM

## 2020-09-04 NOTE — Progress Notes (Signed)
Neurology Progress Note  Patient ID: Jill [facility] is a 60 y.o. with PMHx of  has a past medical history of Diabetes ([place]), Edema, H. pylori infection (06/2019), H. pylori infection (06/2019), Hypertension, and Proteinuria (11/2018). She presented to the ED 8/25 for evaluation of AMS when her hairdresser noted that she did not sound like her normal self over the telephone. A family member went to check on her and noted that she was speaking unintelligibly in [place] and [place] and was noted to be intermittently aphasic on hospital arrival. Initial work up revealed a glucose of 829, creatinine elevation at 2.52 (baseline 1.6), and her initial CTH was negative for acute abnormality. MRI brain obtained without evidence of acute stroke. EEG demonstrated seizures and she has been improving with initiation of Keppra, initiated 8/27.   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 In no acute distress Resp: non-labored breathing, no grossly audible wheezing. SpO2 of 100% on room air on cardiac monitor Cardiac: Perfusing extremities well with 1+ pedal edema bilaterally Abd: soft, non-tender, non-distended  Neuro: MS: Asleep initially, wakes easily to voice.  She is unable to provide a clear and coherent history of present illness.  She is oriented to self, place, year, month, day of the week and situation. Speech is  occasionally halting.  She names the color pink as orange and makes some other minor categorical and paraphasic errors. Speech is without dysarthria.  Follows all commands.  Repetition is mildly impaired No neglect is noted.  CN: PERRL 4 mm and brisk with right pupil cataract, EOMI without ptosis, visual fields are full, facial sensation to light touch is intact and symmetric, face is notable for very slight right facial droop, hearing is intact to voice, phonation is normal, palate rises symmetrically, shoulders shrug symmetrically, tongue protrudes midline. Motor: 5/5 strength in the bilateral lower extremities.  No pronator drift testing of the bilateral upper extremities. Sensory: Intact and symmetric to light touch throughout upper and lower extremities.  DTR: 2+ and symmetric biceps, brachioradialis, and patellae.  Cerebellar: FNF intact bilaterally without dysmetria.  Gait: Deferred   Pertinent Labs: CBC    Component Value Date/Time   WBC 8.8 09/04/2020 0110   RBC 3.81 (L) 09/04/2020 0110   HGB 10.3 (L) 09/04/2020 0110   HGB 11.1 06/23/2019 1208   HCT 31.1 (L) 09/04/2020 0110   HCT 33.2 (L) 06/23/2019 1208   PLT 263 09/04/2020 0110   PLT 257 06/23/2019 1208   MCV 81.6 09/04/2020 0110   MCV 81 06/23/2019 1208   MCH 27.0 09/04/2020 0110   MCHC 33.1 09/04/2020 0110   RDW 12.6 09/04/2020 0110   RDW 12.4 06/23/2019 1208   LYMPHSABS 3.5 09/04/2020 0110   LYMPHSABS 1.8 06/23/2019 1208   MONOABS 0.7 09/04/2020 0110   EOSABS 0.1 09/04/2020 0110   EOSABS 0.1 06/23/2019 1208   BASOSABS 0.0 09/04/2020  0110   BASOSABS 0.0 06/23/2019 1208   CMP     Component Value Date/Time   NA 132 (L) 09/04/2020 0110   NA 139 06/23/2019 1208   K 4.5 09/04/2020 0110   CL 99 09/04/2020 0110   CO2 28 09/04/2020 0110   GLUCOSE 329 (H) 09/04/2020 0110   BUN 29 (H) 09/04/2020 0110   BUN 27 (H) 06/23/2019 1208   CREATININE 2.76 (H) 09/04/2020 0110   CALCIUM 8.5 (L) 09/04/2020 0110   PROT 4.5 (L)  09/04/2020 0110   PROT 6.3 06/23/2019 1208   ALBUMIN 2.0 (L) 09/04/2020 0110   ALBUMIN 3.9 06/23/2019 1208   AST 17 09/04/2020 0110   ALT 14 09/04/2020 0110   ALKPHOS 71 09/04/2020 0110   BILITOT 0.6 09/04/2020 0110   BILITOT 0.4 06/23/2019 1208   GFRNONAA 19 (L) 09/04/2020 0110   GFRAA 39 (L) 06/23/2019 1208   Lab Results  Component Value Date   CHOL 277 (H) 09/02/2020   HDL 42 09/02/2020   LDLCALC 214 (H) 09/02/2020   TRIG 104 09/02/2020   CHOLHDL 6.6 09/02/2020   Lab Results  Component Value Date   HGBA1C 9.1 (A) 06/23/2019   HGBA1C 9.1 06/23/2019   HGBA1C 9.1 (A) 06/23/2019   HGBA1C 9.1 (A) 06/23/2019   - AST, ALT, TSH normal. B12 normal. 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 RPR titer 1:2 which is reassuring.   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 No acute intracranial abnormality.  MRI brain without contrast 09/02/2020: No acute abnormality Left occipital craniotomy with underlying chronic encephalomalacia.  MRA head without contrast 09/03/2020: No major branch occlusion or significant proximal stenosis.  Routine EEG 09/03/2020: "This study showed five seizure without clinical signs arising from left hemisphere, maximal left temporal region. First seizure was ongoing at onset of study on 09/03/2020 at 1134 followed by seizure at 1143, 1145, 1146 and 1154. Average duration of seizures was about 20-40 seconds. Brief ictal-interictal rhythmic discharges were also noted left hemisphere, maximal left temporal region which are suggestive of high potential for seizure recurrence. Additionally, there is evidence of cortical dysfunction in left hemisphere, maximal left temporal region most likely due to underlying structural abnormality/stroke and seizure." Overnight EEG 8/27 -  8/28 - Seizure without clinical signs, left hemisphere, maximal left temporal region  - Brief ictal-interictal rhythmic discharges, left hemisphere, maximal left temporal region  ( BIRDs) - Lateralized periodic discharges,left hemisphere, maximal left temporal region  (LPD) - Intermittent slow, left hemisphere, maximal left temporal region  This study showed seizure without clinical signs arising from left hemisphere, maximal left temporal region. t the beginning of study, 3-6 seizures wre noted/hour. Gradually the frequency improved and one seizure was seen every few hours.  Average duration of seizures was about 20-40 seconds. Brief ictal-interictal rhythmic discharges were also noted left hemisphere, maximal left temporal region which are suggestive of high potential for seizure recurrence. Additionally, there is evidence of cortical dysfunction in left hemisphere, maximal left temporal region most likely due to underlying structural abnormality/stroke and seizure.   Carotid ultrasound with 1-39% ICA stenoses bilaterally. Bilateral vertebral arteries demonstrate antegrade flow.  TTE: Left ventricular ejection fraction, by estimation, is 65%. A small pericardial effusion is present.  No intracardiac source of embolism detected on this transthoracic study.   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  However she is clinically not back to her normal functional baseline and she continues to have some intermittent seizures on EEG activity.  We will therefore add a second antiseizure medication, Depakote  Impression:  - Focal aware epilepsy w/ status epilepticus, resolved   Recommendations: - EEG LTM to monitor for seizure control to be discontinued if epileptologist read shows seizures well-controlled - Depakote oral loading dose of 1000 mg once per day (due to IV national shortage)  and then 250 mg every 8 hours - Keppra 1000 mg load, then 500 mg BID. Adjust as needed for renal function:  Estimated Creatinine Clearance: 18.7 mL/min (A) (by C-G formula based on SCr of 2.76 mg/dL (H)).   CrCl 80 to 130 mL/minute/1.73 m2: 500 mg to 1.5 g every 12 hours.  CrCl 50 to <80 mL/minute/1.73 m2: 500 mg to 1 g every 12 hours.  CrCl 30 to <50 mL/minute/1.73 m2: 250 to 750 mg every 12 hours.  CrCl 15 to <30 mL/minute/1.73 m2: 250 to 500 mg every 12 hours.  CrCl <15 mL/minute/1.73 m2: 250 to 500 mg every 24 hours (expert opinion). - Inpatient seizure precautions - 2 mg Versed PRN any seizure activity lasting > 5 minutes and notify neurology   Standard seizure precautions: Per [employer] statutes, patients with seizures are not allowed to drive until  they have been seizure-free for six months. Use caution when using heavy equipment or power tools. Avoid working on ladders or at heights. Take showers instead of baths. Ensure the water temperature is not too high on the home water heater. Do not go swimming alone. When caring for infants or small children, sit down when holding, feeding, or changing them to minimize risk of injury to the child in the event you have a seizure.  To reduce risk of seizures, maintain good sleep hygiene avoid alcohol and illicit drug use, take all anti-seizure medications as prescribed.   Lesleigh Noe MD-PhD [facility] [phone]

## 2020-09-05 DIAGNOSIS — G40909 Epilepsy, unspecified, not intractable, without status epilepticus: Secondary | ICD-10-CM

## 2020-09-05 DIAGNOSIS — R569 Unspecified convulsions: Secondary | ICD-10-CM

## 2020-09-05 LAB — BASIC METABOLIC PANEL
Anion gap: 7 (ref 5–15)
BUN: 27 mg/dL — ABNORMAL HIGH (ref 6–20)
CO2: 29 mmol/L (ref 22–32)
Calcium: 7.8 mg/dL — ABNORMAL LOW (ref 8.9–10.3)
Chloride: 100 mmol/L (ref 98–111)
Creatinine, Ser: 2.1 mg/dL — ABNORMAL HIGH (ref 0.44–1.00)
GFR, Estimated: 27 mL/min — ABNORMAL LOW (ref >60)
Glucose, Bld: 374 mg/dL — ABNORMAL HIGH (ref 70–99)
Potassium: 4.2 mmol/L (ref 3.5–5.1)
Sodium: 136 mmol/L (ref 135–145)

## 2020-09-05 LAB — HEMOGLOBIN A1C
Hgb A1c MFr Bld: >15.5 % — ABNORMAL HIGH (ref 4.8–5.6)
Mean Plasma Glucose: >398 mg/dL

## 2020-09-05 LAB — CBC
HCT: 34.3 % — ABNORMAL LOW (ref 36.0–46.0)
Hemoglobin: 11.3 g/dL — ABNORMAL LOW (ref 12.0–15.0)
MCH: 27.2 pg (ref 26.0–34.0)
MCHC: 32.9 g/dL (ref 30.0–36.0)
MCV: 82.7 fL (ref 80.0–100.0)
Platelets: 281 10*3/uL (ref 150–400)
RBC: 4.15 MIL/uL (ref 3.87–5.11)
RDW: 12.9 % (ref 11.5–15.5)
WBC: 8.6 10*3/uL (ref 4.0–10.5)
nRBC: 0 % (ref 0.0–0.2)

## 2020-09-05 LAB — GLUCOSE, CAPILLARY
Glucose-Capillary: 134 mg/dL — ABNORMAL HIGH (ref 70–99)
Glucose-Capillary: 111 mg/dL — ABNORMAL HIGH (ref 70–99)
Glucose-Capillary: 265 mg/dL — ABNORMAL HIGH (ref 70–99)
Glucose-Capillary: 313 mg/dL — ABNORMAL HIGH (ref 70–99)
Glucose-Capillary: 170 mg/dL — ABNORMAL HIGH (ref 70–99)

## 2020-09-05 MED ORDER — LEVETIRACETAM 500 MG PO TABS
500.0000 mg | ORAL_TABLET | Freq: Two times a day (BID) | ORAL | Status: DC
  Administered 2020-09-05 – 2020-09-07 (×5): 500 mg via ORAL
  Filled 2020-09-05 (×5): qty 1

## 2020-09-05 NOTE — Progress Notes (Signed)
LTM maint complete - no skin breakdown [facility] monitored, Event button test confirmed by [facility]. ? ?

## 2020-09-05 NOTE — Progress Notes (Signed)
LTM maintenance completed; no skin breakdown was seen; checked under all frontal leads. Tested event button.

## 2020-09-05 NOTE — Procedures (Addendum)
Patient Name: Jill [facility]  MRN: [id number]  Epilepsy Attending: Lora Havens  Referring Physician/Provider: Dr Lesleigh Noe Duration: 09/04/2020 1201 to 09/05/2020 1201   Patient history: 60 year old female presenting with aphasia and confusion in the context of HHS with a blood glucose level of 829. EEG to evaluate for seizure   Level of alertness: Awake, asleep   AEDs during EEG study: LEV, VPA   Technical aspects: This EEG study was done with scalp electrodes positioned according to the 10-20 International system of electrode placement. Electrical activity was acquired at a sampling rate of '500Hz'$  and reviewed with a high frequency filter of '70Hz'$  and a low frequency filter of '1Hz'$ . EEG data were recorded continuously and digitally stored.    Description: The posterior dominant rhythm consists of 9 Hz activity of moderate voltage (25-35 uV) seen predominantly in posterior head regions, symmetric and reactive to eye opening and eye closing. Sleep was characterized by vertex waves, sleep spindles (12 to 14 Hz), maximal frontocentral region. EEG initially showed lateralized periodic discharges in left hemisphere, maximal left temporal region, at 1-1.'5hz'$  which at times appeared rhythmic without definite evolution. Gradually eeg showed intermittent 2-'3hz'$  sharply contoured delta slowing in left hemisphere, maximal left temporal region.    ABNORMALITY - Lateralized periodic discharges,left hemisphere, maximal left temporal region  (LPD) - Intermittent slow, left hemisphere, left hemisphere, maximal left temporal region     IMPRESSION: This study showed evidence of epileptogenicity and cortical dysfunction arising from left hemisphere, maximal left temporal region most likely due to underlying structural abnormality/stroke. No definite seizure were seen during thi study.  EEG appears to be improving compared to previous day.    Caylyn Tedeschi Barbra Sarks

## 2020-09-05 NOTE — Progress Notes (Signed)
  Speech Language Pathology Treatment: Dysphagia;Cognitive-Linquistic  Patient Details Name: Jill [facility] MRN: [id number] DOB: February 07, 1960 Today's Date: 09/05/2020 Time: 1135-1200 SLP Time Calculation (min) (ACUTE ONLY): 25 min  Assessment / Plan / Recommendation Clinical Impression  Pt was seen at bedside for follow up after BSE completed 09/02/20. Pt was seated upright in bed, EEG ongoing. She was awake and alert, pleasant and cooperative. Pt was observed eating crackers and drinking water via straw. No obvious oral issues or overt s/s aspiration observed. RN also indicates good po intake without difficulty.  Pt appears to have cleared significantly from a cognitive standpoint. She was able to engage in conversation, follow complex directions, and recall specifics from previous days. Pt attended to task, alternating between eating/drinking and maintaining conversation topic.   Given pt level of function/independence prior to admit, recommend reassessment of cognitive linguistic function at next session. If patient is discharged prior to re-evaluation, recommend referral to outpatient speech therapy.    HPI HPI: 60 y/o female presented to ED on 8/25 for confusion. CBG on arrival >600 (up to 821). 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 PMH: cataracts, edema, diabetes, HTN, H.pylori infection      SLP Plan  Continue with current plan of care       Recommendations  Diet recommendations: Regular;Thin liquid Liquids provided via: Cup;Straw Medication Administration: Whole meds with liquid Supervision: Patient able to self feed Compensations: Minimize environmental distractions;Slow rate;Small sips/bites Postural Changes and/or Swallow Maneuvers: Seated upright 90 degrees                Oral Care Recommendations: Oral care BID;Patient independent with oral care Follow up Recommendations: Other (comment) (TBD) SLP Visit  Diagnosis: Attention and concentration deficit;Cognitive communication deficit (R41.841) Attention and concentration deficit following: Other cerebrovascular disease Plan: Continue with current plan of care       [place]. Quentin Ore, [facility], [place] Speech Language Pathologist Office: [phone] Pager: [phone]  Shonna Chock 09/05/2020, 12:04 PM

## 2020-09-05 NOTE — Progress Notes (Signed)
PROGRESS NOTE    Jill Jefferson  [id number] DOB: [birth year] DOA: 09/01/2020 PCP: [name], FNP (Inactive)   Chief Complaint  Patient presents with   Altered Mental Status   Brief Narrative:  Jill Jefferson is Jill Jefferson 60 y.o. female with medical history significant of cataracts, edema, diabetes, hypertension, H. pylori presents with altered mental status.  Patient presented altered and oriented to year and self only.  Unable to fully participate in history.  Additional history was obtained with assistance of chart review and family.  Her sister states that her hairstylist called earlier today because the patient canceled her appointment I did not sound right on the phone.  Another family member went to evaluate the patient and noted that she was talking strange and that she was speaking unintelligibly in both [place] and [place].   For me she is intermittently aphasic able to answer some questions appropriately and follows most commands appropriately.  At times when trying to discuss her difficulty expressing herself she repeats "something going on, something going on".   Patient denies fevers, chills, chest pain, shortness of breath, abdominal pain, constipation, diarrhea, nausea, vomiting.    ED Course: Vital signs in the ED significant for blood pressure initially in the 200s now in the 120s with intervention.  Initially tachycardic but now not.  Lab work-up showed BMP with sodium 129 but this corrects to normal considering glucose of 829.  Chloride 93, creatinine elevated 2.52 from baseline of around 1.6.  Calcium 8.8.  LFTs pending.  CBC within normal limits.  Lactic acid 2.5 with repeat pending.  Serum osmolality elevated at 322.  BNP normal.  Respiratory panel for flu and COVID-negative.  Urine analysis showed small hemoglobin and protein.  Ethanol level and UDS are pending.  Chest x-ray showed no acute normalities.  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  Patient given Brigette Hopfer dose of amlodipine, hydrochlorothiazide and labetalol in the ED.  Also started on insulin drip given 2 L of IV fluids and 2 doses of 10 mEq IV potassium.  Assessment & Plan:   Principal Problem:   Hyperosmolar hyperglycemic state (HHS) ([place]) Active Problems:   Diabetes mellitus ([place])   Bilateral lower extremity edema   Focal neurological deficit   AKI (acute kidney injury) ([place])   Acute metabolic encephalopathy  Acute encephalopathy  Seizures >metabolic encephalopathy and seizures in setting of HHS as this has been improving per EDP. > No focal deficits on exam, but issues with word finding and following commands.  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 > MRI without acute abnormality, L occipital craniotomy with underlying chronic encephalomalacia > no major branch occlusion or significant proximal stenosis on MRA - Neurology consult - EEG with seizures, follow LTM EEG - RPR + with titer 1:2, notes she's been treating many times in the past - MRA head without major branch occlusion or significant proximal stenosis - ammonia wnl,high dose thiamine - continue keppra and depakote - LTM EEG with evidence of epileptogenicity and cortical dysfunction arising from L hemisphere - per neurology - ASA, Continue Statin  - Echocardiogram - with EF 65%, no RWMA, severe asymmetric LV hypertrophy, grade I diastolic dysfunction (no intracardiac source of embolism - consider TEE if clinically indicated - defer to neurology) - 1-39%, bilateral vertebral arteries  -  A1C pending, Lipid panel (LDL elevated) -> will increase to 40 mg  - Tele monitoring  -  SLP/PT/OT - follow TSH (wnl), B12 (wnl), folate (wnl), VBG (no hypercarbia), RPR (+ 1:2), ammonia wnl - UA not c/w UTI, CXR without evidence of acute cardiopulm process   Per [facility] statutes, patients with seizures are not allowed to drive until  they  have been seizure-free for six months. Use caution when using heavy equipment or power tools. Avoid working on ladders or at heights. Take showers instead of baths. Ensure the water temperature is not too high on the home water heater. Do not go swimming alone. When caring for infants or small children, sit down when holding, feeding, or changing them to minimize risk of injury to the child in the event you have Jill Jefferson seizure. Also, Maintain good sleep hygiene. Avoid alcohol.  HHS > Patient presenting altered as above.  Found to have glucose in the 800s.  Serum osmolality 322.  Mildly elevated lactic acid 2.5 with Chezney Huether creatinine of 2.5 elevated from baseline of 1.6. - he's off insulin gtt now - continue levemir, SSI - BG"s improved, continue to monitor - carb mod   AKI > Creatinine elevated to 2.52 from baseline around 1.6. - initially improved, but then worsening - 2.76 -> repeat UA, follow urine lytes, bladder scan   - fena suggestive of intrinsic dz -> ATN?  - improved today, continue to monitor  - follow UP/C - net + 6 L -> follow CXR, doesn't appear overloaded - UA with 100 mg/dl protein, follow renal US -> atrophic L kidney and borderline atrophic right kidney   Hypertension > BP initially elevated to [id number] systolic in the ED now improved to the 120s with multiple interventions in the ED. - We will hold off on additional interventions while awaiting MRI brain to further evaluate her mentation.   Hypokalemia - improved  + RPR - reports hx of treatment   DVT prophylaxis: lovenox Code Status: full  Family Communication: sister over phone 8/26 Disposition:   Status is: Observation  The patient will require care spanning > 2 midnights and should be moved to inpatient because: Inpatient level of care appropriate due to severity of illness  Dispo: The patient is from: Home              Anticipated d/c is to: Home              Patient currently is not medically stable to d/c.   Difficult  to place patient No       Consultants:  neurology  Procedures:  Carotid US Summary:  Right Carotid: Velocities in the right ICA are consistent with Jill Jefferson 1-39%  stenosis.   Left Carotid: Velocities in the left ICA are consistent with Jill Jefferson 1-39%  stenosis.   Vertebrals:  Bilateral vertebral arteries demonstrate antegrade flow.  Subclavians: Normal flow hemodynamics were seen in bilateral subclavian               arteries.   Echo IMPRESSIONS     1. Left ventricular ejection fraction, by estimation, is 65%. The left  ventricle has normal function. The left ventricle has no regional wall  motion abnormalities. There is severe asymmetric left ventricular  hypertrophy of the septal segment. Left  ventricular diastolic parameters are consistent with Grade I diastolic  dysfunction (impaired relaxation).   2. Right ventricular systolic function is normal. The right ventricular  size is normal. Tricuspid regurgitation signal is inadequate for assessing  PA pressure.   3. Jill Jefferson small pericardial effusion is present.   4. The  mitral valve is normal in structure. No evidence of mitral valve  regurgitation. No evidence of mitral stenosis.   5. The aortic valve is tricuspid. Aortic valve regurgitation is not  visualized. No aortic stenosis is present.   6. The inferior vena cava is normal in size with greater than 50%  respiratory variability, suggesting right atrial pressure of 3 mmHg.   Conclusion(s)/Recommendation(s): No intracardiac source of embolism  detected on this transthoracic study. Jill Jefferson transesophageal echocardiogram is  recommended to exclude cardiac source of embolism if clinically indicated  particularly in setting of suboptimal  apical window.   EEG ABNORMALITY - Seizure without clinical signs, left hemisphere, maximal left temporal region  - Brief ictal-interictal rhythmic discharges, left hemisphere, maximal left temporal region  ( BIRDs) - Lateralized periodic  discharges,left hemisphere, maximal left temporal region  (LPD) - Intermittent slow, left hemisphere, maximal left temporal region  (   IMPRESSION: This study showed seizure without clinical signs arising from left hemisphere, maximal left temporal region. t the beginning of study, 3-6 seizures wre noted/hour. Gradually the frequency improved and one seizure was seen every few hours.  Average duration of seizures was about 20-40 seconds. Brief ictal-interictal rhythmic discharges were also noted left hemisphere, maximal left temporal region which are suggestive of high potential for seizure recurrence. Additionally, there is evidence of cortical dysfunction in left hemisphere, maximal left temporal region most likely due to underlying structural abnormality/stroke and seizure.  Antimicrobials:  Anti-infectives (From admission, onward)    None          Subjective: No new complaints  Objective: Vitals:   09/04/20 2020 09/05/20 0017 09/05/20 0436 09/05/20 0747  BP: (!) 152/71 (!) 153/73 (!) 171/82 (!) 170/88  Pulse: 89 76 72 72  Resp: '16 16 18 '$ (P) 18  Temp: 98.3 F (36.8 C) 98.7 F (37.1 C) 98.5 F (36.9 C) 98.3 F (36.8 C)  TempSrc: Oral Oral Oral Oral  SpO2: 98% 99% 99% 100%  Weight:      Height:        Intake/Output Summary (Last 24 hours) at 09/05/2020 1024 Last data filed at 09/04/2020 1805 Gross per 24 hour  Intake 1295.32 ml  Output --  Net 1295.32 ml   Filed Weights   09/01/20 2326  Weight: 65 kg    Examination:  General: No acute distress. Cardiovascular: Heart sounds show Jill Jefferson regular rate, and rhythm.  Lungs: Clear to auscultation bilaterally  Abdomen: Soft, nontender, nondistended  Neurological: Alert and oriented 3. Moves all extremities 4. Cranial nerves II through XII grossly intact. Skin: Warm and dry. No rashes or lesions. Extremities: No clubbing or cyanosis. No edema.   Data Reviewed: I have personally reviewed following labs and imaging  studies  CBC: Recent Labs  Lab 09/01/20 1450 09/01/20 1506 09/03/20 0145 09/04/20 0110  WBC 9.9  --  9.3 8.8  NEUTROABS 8.1*  --  4.8 4.5  HGB 12.8 13.6 11.3* 10.3*  HCT 38.3 40.0 32.6* 31.1*  MCV 80.6  --  78.4* 81.6  PLT 296  --  296 [id number]    Basic Metabolic Panel: Recent Labs  Lab 09/02/20 0630 09/02/20 1330 09/03/20 0145 09/04/20 0110 09/04/20 1508  NA 136 131* 135 132* 135  K 2.6* 3.4* 3.5 4.5 3.9  CL 100 96* 102 99 103  CO2 '29 29 27 28 28  '$ GLUCOSE 133* 258* 109* 329* 226*  BUN '13 10 17 '$ 29* 29*  CREATININE 1.89* 1.87* 2.22* 2.76* 2.42*  CALCIUM 8.7* 8.6* 8.7*  8.5* 8.1*  MG 1.6*  --  2.0 1.8  --   PHOS  --   --   --  3.4  --     GFR: Estimated Creatinine Clearance: 21.3 mL/min (Jill Jefferson) (by C-G formula based on SCr of 2.42 mg/dL (H)).  Liver Function Tests: Recent Labs  Lab 09/01/20 2252 09/03/20 0145 09/04/20 0110  AST '18 20 17  '$ ALT '17 15 14  '$ ALKPHOS 78 66 71  BILITOT 0.6 0.7 0.6  PROT 5.2* 4.7* 4.5*  ALBUMIN 2.6* 2.2* 2.0*    CBG: Recent Labs  Lab 09/04/20 1944 09/04/20 2148 09/04/20 2317 09/05/20 0435 09/05/20 0635  GLUCAP 293* 284* 222* 134* 111*     Recent Results (from the past 240 hour(s))  Resp Panel by RT-PCR (Flu Akaysha Cobern&B, Covid) Nasopharyngeal Swab     Status: None   Collection Time: 09/01/20  3:27 PM   Specimen: Nasopharyngeal Swab; Nasopharyngeal(NP) swabs in vial transport medium  Result Value Ref Range Status   SARS Coronavirus 2 by RT PCR NEGATIVE NEGATIVE Final    Comment: (NOTE) SARS-CoV-2 target nucleic acids are NOT DETECTED.  The SARS-CoV-2 RNA is generally detectable in upper respiratory specimens during the acute phase of infection. The lowest concentration of SARS-CoV-2 viral copies this assay can detect is 138 copies/mL. Jahnessa Vanduyn negative result does not preclude SARS-Cov-2 infection and should not be used as the sole basis for treatment or other patient management decisions. Teandra Harlan negative result may occur with  improper specimen  collection/handling, submission of specimen other than nasopharyngeal swab, presence of viral mutation(s) within the areas targeted by this assay, and inadequate number of viral copies(<138 copies/mL). Sarissa Dern negative result must be combined with clinical observations, patient history, and epidemiological information. The expected result is Negative.  Fact Sheet for Patients:  EntrepreneurPulse.com.au  Fact Sheet for Healthcare Providers:  IncredibleEmployment.be  This test is no t yet approved or cleared by the [place] FDA and  has been authorized for detection and/or diagnosis of SARS-CoV-2 by FDA under an Emergency Use Authorization (EUA). This EUA will remain  in effect (meaning this test can be used) for the duration of the COVID-19 declaration under Section 564(b)(1) of the Act, 21 U.S.C.section 360bbb-3(b)(1), unless the authorization is terminated  or revoked sooner.       Influenza Flemon Kelty by PCR NEGATIVE NEGATIVE Final   Influenza B by PCR NEGATIVE NEGATIVE Final    Comment: (NOTE) The Xpert Xpress SARS-CoV-2/FLU/RSV plus assay is intended as an aid in the diagnosis of influenza from Nasopharyngeal swab specimens and should not be used as Joshia Kitchings sole basis for treatment. Nasal washings and aspirates are unacceptable for Xpert Xpress SARS-CoV-2/FLU/RSV testing.  Fact Sheet for Patients: EntrepreneurPulse.com.au  Fact Sheet for Healthcare Providers: IncredibleEmployment.be  This test is not yet approved or cleared by the [place] FDA and has been authorized for detection and/or diagnosis of SARS-CoV-2 by FDA under an Emergency Use Authorization (EUA). This EUA will remain in effect (meaning this test can be used) for the duration of the COVID-19 declaration under Section 564(b)(1) of the Act, 21 U.S.C. section 360bbb-3(b)(1), unless the authorization is terminated or revoked.  Performed at [facility], [place] [address], [place],  [address]          Radiology Studies: DG CHEST PORT 1 VIEW  Result Date: 09/04/2020 CLINICAL DATA:  Altered mental status, seizure. EXAM: PORTABLE CHEST 1 VIEW COMPARISON:  Chest x-ray dated 09/01/2020. FINDINGS: Heart size and mediastinal contours are within  normal limits. Lungs are clear. No pleural effusion or pneumothorax is seen. Osseous structures about the chest are unremarkable. IMPRESSION: No active disease. No evidence of pneumonia or pulmonary edema. Electronically Signed   By: Jill Jefferson M.D.   On: 09/04/2020 10:21   EEG adult  Result Date: 09/03/2020 Lora Havens, MD     09/03/2020  1:43 PM Patient Name: Calais Ayars MRN: [id number] Epilepsy Attending: Lora Havens Referring Physician/Provider: Dr Kerney Elbe Date: 09/03/2020 Duration: 22.5mns Patient history: 60year old female presenting with aphasia and confusion in the context of HHS with Kourtlyn Charlet blood glucose level of 829. EEG to evaluate for seizure Level of alertness: Awake, asleep AEDs during EEG study: None Technical aspects: This EEG study was done with scalp electrodes positioned according to the 10-20 International system of electrode placement. Electrical activity was acquired at Angelys Yetman sampling rate of '500Hz'$  and reviewed with Will Schier high frequency filter of '70Hz'$  and Trig Mcbryar low frequency filter of '1Hz'$ . EEG data were recorded continuously and digitally stored. Description: The posterior dominant rhythm consists of 9 Hz activity of moderate voltage (25-35 uV) seen predominantly in posterior head regions, symmetric and reactive to eye opening and eye closing. Sleep was characterized by vertex waves, sleep spindles (12 to 14 Hz), maximal frontocentral region. Lateralized periodic discharges were noted in left hemisphere, maximal left temporal region, at 1-'2hz'$  which at times appeared rhythmic without definite evolution consistent with brief-ictal-interictal rhythmic discharges.  Intermittent rhythmic 3-'5hz'$  theta-delta slowing was also noted in left hemisphere, maximal left temporal region. Five seizure without clinical signs were also noted arising from left hemisphere, maximal left temporal region. First seizure was ongoing at onset of study on 09/03/2020 at 1134. This was followed by seizure at 1143, 1145, 1146 and 1154. Average duration of seizures was about 20-40 seconds. During seizure EEG showed 1-1.5 hz periodic sharp waves in left hemisphere, maximal left temporal region which gradually increased in amplitude as well as frequency increased to 2-2.'5hz'$ . There was also 5-'6hz'$  theta slowing which gradually evolved into 2-'3hz'$  delta slowing. Hyperventilation and photic stimulation were not performed.   ABNORMALITY - Seizure without clinical signs, left hemisphere, maximal left temporal region - Brief ictal-interictal rhythmic discharges, left hemisphere, maximal left temporal region  ( BIRDs) - Lateralized periodic discharges,left hemisphere, maximal left temporal region  (LPD) - Intermittent slow, left hemisphere, maximal left temporal region  ( IMPRESSION: This study showed five seizure without clinical signs arising from left hemisphere, maximal left temporal region. First seizure was ongoing at onset of study on 09/03/2020 at 1134 followed by seizure at 1143, 1145, 1146 and 1154. Average duration of seizures was about 20-40 seconds. Brief ictal-interictal rhythmic discharges were also noted left hemisphere, maximal left temporal region which are suggestive of high potential for seizure recurrence. Additionally, there is evidence of cortical dysfunction in left hemisphere, maximal left temporal region most likely due to underlying structural abnormality/stroke and seizure. Dr BCurly Shoreswas notified. Priyanka OBarbra Sarks  Overnight EEG with video  Result Date: 09/04/2020 YLora Havens MD     09/05/2020  8:49 AM Patient Name: MGenessis FielderMRN: [id number] Attending: PLora HavensReferring Physician/Provider: Dr SLesleigh NoeDuration: 09/03/2020 1201 to 09/04/2020 1201  Patient history: 60year old female presenting with aphasia and confusion in the context of HHS with Keymani Glynn blood glucose level of 829. EEG to evaluate for seizure  Level of alertness: Awake, asleep  AEDs during EEG study: LEV  Technical aspects: This EEG study was  done with scalp electrodes positioned according to the 10-20 International system of electrode placement. Electrical activity was acquired at Thom Ollinger sampling rate of '500Hz'$  and reviewed with Rayme Bui high frequency filter of '70Hz'$  and Duante Arocho low frequency filter of '1Hz'$ . EEG data were recorded continuously and digitally stored.  Description: The posterior dominant rhythm consists of 9 Hz activity of moderate voltage (25-35 uV) seen predominantly in posterior head regions, symmetric and reactive to eye opening and eye closing. Sleep was characterized by vertex waves, sleep spindles (12 to 14 Hz), maximal frontocentral region. Lateralized periodic discharges were noted in left hemisphere, maximal left temporal region, at 1-'2hz'$  which at times appeared rhythmic without definite evolution consistent with brief-ictal-interictal rhythmic discharges. Intermittent rhythmic 3-'5hz'$  theta-delta slowing was also noted in left hemisphere, maximal left temporal region.  Seizure without clinical signs were also noted arising from left hemisphere, maximal left temporal region. At the beginning of study, 3-6 seizures wre noted/hour. Gradually the frequency improved and one seizure was seen every few hours. Average duration of seizures was about 20-40 seconds. During seizure EEG showed 1-1.5 hz periodic sharp waves in left hemisphere, maximal left temporal region which gradually increased in amplitude as well as frequency increased to 2-2.'5hz'$ . There was also 5-'6hz'$  theta slowing which gradually evolved into 2-'3hz'$  delta slowing.  Hyperventilation and photic stimulation were not performed.    ABNORMALITY -  Seizure without clinical signs, left hemisphere, maximal left temporal region - Brief ictal-interictal rhythmic discharges, left hemisphere, maximal left temporal region  ( BIRDs) - Lateralized periodic discharges,left hemisphere, maximal left temporal region  (LPD) - Intermittent slow, left hemisphere, maximal left temporal region  (  IMPRESSION: This study showed seizure without clinical signs arising from left hemisphere, maximal left temporal region. t the beginning of study, 3-6 seizures wre noted/hour. Gradually the frequency improved and one seizure was seen every few hours.  Average duration of seizures was about 20-40 seconds. Brief ictal-interictal rhythmic discharges were also noted left hemisphere, maximal left temporal region which are suggestive of high potential for seizure recurrence. Additionally, there is evidence of cortical dysfunction in left hemisphere, maximal left temporal region most likely due to underlying structural abnormality/stroke and seizure.  Priyanka Barbra Sarks        Scheduled Meds:  aspirin EC  81 mg Oral Daily   atorvastatin  40 mg Oral Daily   divalproex  250 mg Oral Q8H   enoxaparin (LOVENOX) injection  30 mg Subcutaneous Q24H   insulin aspart  0-5 Units Subcutaneous QHS   insulin aspart  0-9 Units Subcutaneous TID WC   insulin aspart  3 Units Subcutaneous TID WC   insulin detemir  10 Units Subcutaneous Daily   [START ON 09/09/2020] thiamine injection  100 mg Intravenous Daily   Continuous Infusions:  lactated ringers 125 mL/hr at 09/04/20 1805   levETIRAcetam 500 mg (09/04/20 2137)   thiamine injection 250 mg (09/05/20 0836)     LOS: 3 days    Time spent: over 30 min    Fayrene Helper, MD [facility]   To contact the attending provider between 7A-7P or the covering provider during after hours 7P-7A, please log into the web site [email or web address] and access using universal [place] password for that web site. If you do not have the password,  please call the hospital operator.  09/05/2020, 10:24 AM

## 2020-09-05 NOTE — Progress Notes (Signed)
Occupational Therapy Treatment Patient Details Name: Jill Jefferson MRN: [id number] DOB: [birth year] Today's Date: 09/05/2020    History of present illness 60 y/o female presented to ED on 8/25 for confusion. CBG on arrival >600 (up to 821). 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 PMH: cataracts, edema, diabetes, HTN, H.pylori infection   OT comments  This 60 yo female admitted with above presents to acute OT with making progress towards goals but at the moment limited by continuous EEG machine. She will continue to benefit from acute OT without need for skilled OT follow up.  Follow Up Recommendations  Supervision - Intermittent    Equipment Recommendations  None recommended by OT       Precautions / Restrictions Precautions Precautions: Fall Restrictions Weight Bearing Restrictions: No       Mobility Bed Mobility Overal bed mobility: Modified Independent             General bed mobility comments: HOB up    Transfers Overall transfer level: Needs assistance Equipment used: None Transfers: Sit to/from Stand Sit to Stand: Supervision         General transfer comment: S to ambulate back and forth 3 steps and lateral steps in both directions 3 steps    Balance Overall balance assessment: Independent                                         ADL either performed or assessed with clinical judgement   ADL Overall ADL's : Needs assistance/impaired                     Lower Body Dressing: Set up;Supervision/safety;Sit to/from stand Lower Body Dressing Details (indicate cue type and reason): crossing one leg over the other                     Vision Baseline Vision/History: 1 Wears glasses Ability to See in Adequate Light: 0 Adequate Patient Visual Report: No change from baseline Vision Assessment?: Yes Eye Alignment:  (left eye appears a little off center but pt does  not report any double vision) Ocular Range of Motion: Within Functional Limits Tracking/Visual Pursuits: Able to track stimulus in all quads without difficulty Visual Fields: Other (comment) (left eye (left inferior and superior field deficits))--pre existing          Cognition Arousal/Alertness: Awake/alert Behavior During Therapy: WFL for tasks assessed/performed Overall Cognitive Status: Within Functional Limits for tasks assessed     General Comments: Pt followed all one step commands with me, pt was aware of her telemetry box (laying on bed) and EEG box when moving around in bed, up to EOB.                   Pertinent Vitals/ Pain       Pain Assessment: No/denies pain Faces Pain Scale: No hurt         Frequency  Min 2X/week        Progress Toward Goals  OT Goals(current goals can now be found in the care plan section)  Progress towards OT goals: Progressing toward goals  Acute Rehab OT Goals Patient Stated Goal: To move apartments OT Goal Formulation: With patient Time For Goal Achievement: 09/16/20 Potential to Achieve Goals: Good  Plan Discharge plan remains appropriate  AM-PAC OT "6 Clicks" Daily Activity     Outcome Measure   Help from another person eating meals?: None Help from another person taking care of personal grooming?: A Little Help from another person toileting, which includes using toliet, bedpan, or urinal?: A Little Help from another person bathing (including washing, rinsing, drying)?: A Little Help from another person to put on and taking off regular upper body clothing?: A Little Help from another person to put on and taking off regular lower body clothing?: A Little 6 Click Score: 19    End of Session    OT Visit Diagnosis: Unsteadiness on feet (R26.81)   Activity Tolerance Patient tolerated treatment well   Patient Left in bed;with call bell/phone within reach;with bed alarm set   Nurse Communication  (called that  IV eas occluded)        Time: [id number] OT Time Calculation (min): 27 min  Charges: OT General Charges $OT Visit: 1 Visit OT Treatments $Self Care/Home Management : 23-37 mins  [place] Circle, OTR/L Acute [employer] Pager [phone] Office [phone]     Almon Register 09/05/2020, 4:35 PM

## 2020-09-05 NOTE — Progress Notes (Addendum)
Physical Therapy Progress Note  Assessment: Pt on EEG when PT arrived and mobility limited to bedside. Pt requesting to ambulate into the bathroom to have a BM. Due to EEG, pt settled for the [facility]. She did well transferring to/from [facility], and was able to perform peri-care with mod I (set up provided) while standing unsupported. Will continue to follow and progress as able per POC.    PT Visit Information  Last PT Received On 09/05/20  Assistance Needed +1  History of Present Illness 60 y/o female presented to ED on 8/25 for confusion. CBG on arrival >600 (up to 821). 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 PMH: cataracts, edema, diabetes, HTN, H.pylori infection  Subjective Data  Patient Stated Goal Be able to walk around the halls (currently limited due to EEG)  Precautions  Precautions Fall  Restrictions  Weight Bearing Restrictions No  Pain Assessment  Pain Assessment No/denies pain  Faces Pain Scale 0  Cognition  Arousal/Alertness Awake/alert  Behavior During Therapy [facility] for tasks assessed/performed  Overall Cognitive Status No family/caregiver present to determine baseline cognitive functioning  Area of Impairment Safety/judgement;Problem solving;Memory  Memory Decreased short-term memory  Safety/Judgement Decreased awareness of safety;Decreased awareness of deficits  Problem Solving Slow processing;Requires verbal cues  Bed Mobility  Overal bed mobility Modified Independent  Transfers  Overall transfer level Needs assistance  Equipment used None  Transfers Sit to/from Stand  Sit to Stand Supervision  General transfer comment Supervision for line maintenance due to EEG. Eager to ambulate.  Ambulation/Gait  General Gait Details Unable due to EEG  Modified Rankin (Stroke Patients Only)  Pre-Morbid Rankin Score 0  Modified Rankin 4  Balance  Overall balance assessment Mild deficits observed, not formally tested  PT  - End of Session  Equipment Utilized During Treatment Gait belt  Activity Tolerance Patient tolerated treatment well  Patient left in chair;with call bell/phone within reach;with chair alarm set  Nurse Communication Mobility status   PT - Assessment/Plan  PT Plan Current plan remains appropriate  PT Visit Diagnosis Unsteadiness on feet (R26.81);Muscle weakness (generalized) (M62.81)  PT Frequency (ACUTE ONLY) Min 3X/week  Follow Up Recommendations No PT follow up  PT equipment None recommended by PT  AM-PAC PT "6 Clicks" Mobility Outcome Measure (Version 2)  Help needed turning from your back to your side while in a flat bed without using bedrails? 4  Help needed moving from lying on your back to sitting on the side of a flat bed without using bedrails? 4  Help needed moving to and from a bed to a chair (including a wheelchair)? 3  Help needed standing up from a chair using your arms (e.g., wheelchair or bedside chair)? 3  Help needed to walk in hospital room? 3  Help needed climbing 3-5 steps with a railing?  3  6 Click Score 20  Consider Recommendation of Discharge To: Home with no services  Progressive Mobility  What is the highest level of mobility based on the progressive mobility assessment? Level 4 (Walks with assist in room) - Balance while marching in place and cannot step forward and back - Complete  Mobility Ambulated with assistance in room  PT Goal Progression  Progress towards PT goals Progressing toward goals  Acute Rehab PT Goals  PT Goal Formulation With patient  Time For Goal Achievement 09/16/20  Potential to Achieve Goals Good  PT Time Calculation  PT Start Time (ACUTE ONLY) [id number]  PT  Stop Time (ACUTE ONLY) 1005  PT Time Calculation (min) (ACUTE ONLY) 28 min  PT General Charges  $$ ACUTE PT VISIT 1 Visit  PT Treatments  $Therapeutic Activity 23-37 min    Rolinda Roan, PT, DPT Acute Rehabilitation Services Pager: [phone] Office: [phone]

## 2020-09-05 NOTE — Progress Notes (Signed)
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 MRI showed known remote infarcts but no acute findings. EEG showed frequent subclinical seizures which improved after starting keppra 8/27 and and clinically resolved after adding VPA on 8/28. LTM report past 24 hrs is pending.  S: Patient feels well, no new neurologic complaints, does not recall any clinical episodes c/f seizure activity past 24 hrs.  O:  Vitals:   09/05/20 1519 09/05/20 2009  BP: (!) 157/73 (!) 181/88  Pulse: 83 93  Resp: 14 18  Temp: 97.9 F (36.6 C) 98.8 F (37.1 C)  SpO2: 100% 98%   Gen: Laying in bed, sleeping on initial assessment but wakes up readily. In no acute distress Resp: non-labored breathing, no grossly audible wheezing. SpO2 of 100% on room air on cardiac monitor Cardiac: Perfusing extremities well with 1+ pedal edema bilaterally Abd: soft, non-tender, non-distended   Neuro: MS: Awake. She is unable to provide a clear and coherent history of present illness.  She is oriented to self, place, year, month, day of the week and situation. Speech is occasionally halting.  She names the colors incorrectly and makes other minor categorical and paraphasic errors. Speech is without dysarthria.  Follows all commands.  Repetition is mildly impaired No neglect is noted.  CN: PERRL 4 mm and brisk with right pupil cataract, EOMI without ptosis, visual fields are full, facial sensation to light touch is intact and symmetric, face is notable for very slight right facial droop, hearing is intact to voice, phonation is normal, palate rises symmetrically, shoulders shrug symmetrically, tongue protrudes midline. Motor: 5/5 strength in the bilateral lower extremities.  No pronator drift testing of the bilateral upper extremities. Sensory: Intact and symmetric to light touch throughout upper and lower extremities.  DTR: 2+ and  symmetric biceps, brachioradialis, and patellae.  Cerebellar: FNF intact bilaterally without dysmetria.  Gait: Deferred   Additional pertinent Data:    CT Head without contrast 09/01/2020: Old left occipital infarct with encephalomalacia. No acute intracranial abnormality.   MRI brain without contrast 09/02/2020: No acute abnormality Left occipital craniotomy with underlying chronic encephalomalacia.   MRA head without contrast 09/03/2020: No major branch occlusion or significant proximal stenosis.   Routine EEG 09/03/2020: "This study showed five seizure without clinical signs arising from left hemisphere, maximal left temporal region. First seizure was ongoing at onset of study on 09/03/2020 at 1134 followed by seizure at 1143, 1145, 1146 and 1154. Average duration of seizures was about 20-40 seconds. Brief ictal-interictal rhythmic discharges were also noted left hemisphere, maximal left temporal region which are suggestive of high potential for seizure recurrence. Additionally, there is evidence of cortical dysfunction in left hemisphere, maximal left temporal region most likely due to underlying structural abnormality/stroke and seizure." Overnight EEG 8/27 - 8/28 - Seizure without clinical signs, left hemisphere, maximal left temporal region  - Brief ictal-interictal rhythmic discharges, left hemisphere, maximal left temporal region  ( BIRDs) - Lateralized periodic discharges,left hemisphere, maximal left temporal region  (LPD) - Intermittent slow, left hemisphere, maximal left temporal region  This study showed seizure without clinical signs arising from left hemisphere, maximal left temporal region. t the beginning of study, 3-6 seizures wre noted/hour. Gradually the frequency improved and one seizure was seen every few hours.  Average duration of seizures was about 20-40 seconds. Brief ictal-interictal rhythmic discharges were also noted left hemisphere, maximal left temporal region which  are suggestive  of high potential for seizure recurrence. Additionally, there is evidence of cortical dysfunction in left hemisphere, maximal left temporal region most likely due to underlying structural abnormality/stroke and seizure.    Carotid ultrasound with 1-39% ICA stenoses bilaterally. Bilateral vertebral arteries demonstrate antegrade flow.   TTE: Left ventricular ejection fraction, by estimation, is 65%. A small pericardial effusion is present.  No intracardiac source of embolism detected on this transthoracic study.   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  Remains on LTM, report for past 24 hrs is pending. No clinical events since VPA added yesterday   Impression:  - Focal aware epilepsy w/ status epilepticus   Recommendations: - EEG LTM to monitor for seizure control to be discontinued if epileptologist read shows seizures well-controlled - Depakote oral loading dose of 1000 mg once per day (due to IV national shortage) and then 250 mg every 8 hours - Keppra 1000 mg load, then 500 mg BID. Adjust as needed for renal function:  Estimated Creatinine Clearance: 18.7 mL/min (A) (by C-G formula based on SCr of 2.76 mg/dL (H)).              CrCl 80 to 130 mL/minute/1.73 m2: 500 mg to 1.5 g every 12 hours.             CrCl 50 to <80 mL/minute/1.73 m2: 500 mg to 1 g every 12 hours.             CrCl 30 to <50 mL/minute/1.73 m2: 250 to 750 mg every 12 hours.             CrCl 15 to <30 mL/minute/1.73 m2: 250 to 500 mg every 12 hours.             CrCl <15 mL/minute/1.73 m2: 250 to 500 mg every 24 hours (expert opinion). - Inpatient seizure precautions - 2 mg Versed PRN any seizure activity lasting > 5 minutes and notify neurology    Standard seizure precautions: Per [facility] statutes, patients with seizures are not allowed to drive until  they have been  seizure-free for six months. Use caution when using heavy equipment or power tools. Avoid working on ladders or at heights. Take showers instead of baths. Ensure the water temperature is not too high on the home water heater. Do not go swimming alone. When caring for infants or small children, sit down when holding, feeding, or changing them to minimize risk of injury to the child in the event you have a seizure.  To reduce risk of seizures, maintain good sleep hygiene avoid alcohol and illicit drug use, take all anti-seizure medications as prescribed.   Su Monks, MD [facility] [phone]  If 7pm- 7am, please page neurology on call as listed in [place].

## 2020-09-06 LAB — COMPREHENSIVE METABOLIC PANEL
ALT: 13 U/L (ref 0–44)
AST: 17 U/L (ref 15–41)
Albumin: 1.8 g/dL — ABNORMAL LOW (ref 3.5–5.0)
Alkaline Phosphatase: 53 U/L (ref 38–126)
Anion gap: 5 (ref 5–15)
BUN: 30 mg/dL — ABNORMAL HIGH (ref 6–20)
CO2: 27 mmol/L (ref 22–32)
Calcium: 8.4 mg/dL — ABNORMAL LOW (ref 8.9–10.3)
Chloride: 107 mmol/L (ref 98–111)
Creatinine, Ser: 2.12 mg/dL — ABNORMAL HIGH (ref 0.44–1.00)
GFR, Estimated: 27 mL/min — ABNORMAL LOW (ref >60)
Glucose, Bld: 90 mg/dL (ref 70–99)
Potassium: 3.7 mmol/L (ref 3.5–5.1)
Sodium: 139 mmol/L (ref 135–145)
Total Bilirubin: 0.6 mg/dL (ref 0.3–1.2)
Total Protein: 4.2 g/dL — ABNORMAL LOW (ref 6.5–8.1)

## 2020-09-06 LAB — GLUCOSE, CAPILLARY
Glucose-Capillary: 112 mg/dL — ABNORMAL HIGH (ref 70–99)
Glucose-Capillary: 148 mg/dL — ABNORMAL HIGH (ref 70–99)
Glucose-Capillary: 194 mg/dL — ABNORMAL HIGH (ref 70–99)
Glucose-Capillary: 202 mg/dL — ABNORMAL HIGH (ref 70–99)
Glucose-Capillary: 141 mg/dL — ABNORMAL HIGH (ref 70–99)

## 2020-09-06 LAB — CBC WITH DIFFERENTIAL/PLATELET
Abs Immature Granulocytes: 0.05 10*3/uL (ref 0.00–0.07)
Basophils Absolute: 0 10*3/uL (ref 0.0–0.1)
Basophils Relative: 1 %
Eosinophils Absolute: 0.2 10*3/uL (ref 0.0–0.5)
Eosinophils Relative: 2 %
HCT: 29.2 % — ABNORMAL LOW (ref 36.0–46.0)
Hemoglobin: 9.4 g/dL — ABNORMAL LOW (ref 12.0–15.0)
Immature Granulocytes: 1 %
Lymphocytes Relative: 37 %
Lymphs Abs: 3 10*3/uL (ref 0.7–4.0)
MCH: 26.7 pg (ref 26.0–34.0)
MCHC: 32.2 g/dL (ref 30.0–36.0)
MCV: 83 fL (ref 80.0–100.0)
Monocytes Absolute: 0.6 10*3/uL (ref 0.1–1.0)
Monocytes Relative: 8 %
Neutro Abs: 4.2 10*3/uL (ref 1.7–7.7)
Neutrophils Relative %: 51 %
Platelets: 241 10*3/uL (ref 150–400)
RBC: 3.52 MIL/uL — ABNORMAL LOW (ref 3.87–5.11)
RDW: 12.8 % (ref 11.5–15.5)
WBC: 8.1 10*3/uL (ref 4.0–10.5)
nRBC: 0 % (ref 0.0–0.2)

## 2020-09-06 LAB — MAGNESIUM: Magnesium: 1.6 mg/dL — ABNORMAL LOW (ref 1.7–2.4)

## 2020-09-06 LAB — PHOSPHORUS: Phosphorus: 3.9 mg/dL (ref 2.5–4.6)

## 2020-09-06 MED ORDER — INSULIN ASPART 100 UNIT/ML IJ SOLN
5.0000 [IU] | Freq: Three times a day (TID) | INTRAMUSCULAR | 1 refills | Status: DC
Start: 2020-09-07 — End: 2020-09-07
  Filled 2020-09-06: qty 10, 67d supply, fill #0

## 2020-09-06 MED ORDER — INSULIN ASPART 100 UNIT/ML IJ SOLN
5.0000 [IU] | Freq: Three times a day (TID) | INTRAMUSCULAR | Status: DC
  Administered 2020-09-06 – 2020-09-07 (×5): 5 [IU] via SUBCUTANEOUS

## 2020-09-06 MED ORDER — ATORVASTATIN CALCIUM 40 MG PO TABS
40.0000 mg | ORAL_TABLET | Freq: Every day | ORAL | 1 refills | Status: DC
  Filled 2020-09-06: qty 30, 30d supply, fill #0

## 2020-09-06 MED ORDER — AMLODIPINE BESYLATE 5 MG PO TABS
5.0000 mg | ORAL_TABLET | Freq: Every day | ORAL | Status: DC
  Administered 2020-09-06: 5 mg via ORAL
  Filled 2020-09-06: qty 1

## 2020-09-06 MED ORDER — MAGNESIUM OXIDE -MG SUPPLEMENT 400 (240 MG) MG PO TABS
400.0000 mg | ORAL_TABLET | Freq: Every day | ORAL | Status: DC
  Administered 2020-09-06 – 2020-09-07 (×2): 400 mg via ORAL
  Filled 2020-09-06 (×2): qty 1

## 2020-09-06 MED ORDER — DIVALPROEX SODIUM 250 MG PO DR TAB
250.0000 mg | DELAYED_RELEASE_TABLET | Freq: Three times a day (TID) | ORAL | 1 refills | Status: DC
  Filled 2020-09-06: qty 90, 30d supply, fill #0

## 2020-09-06 MED ORDER — INSULIN GLARGINE 100 UNIT/ML ~~LOC~~ SOLN
10.0000 [IU] | Freq: Every day | SUBCUTANEOUS | 1 refills | Status: DC
  Filled 2020-09-06: qty 10, 30d supply, fill #0

## 2020-09-06 MED ORDER — AMLODIPINE BESYLATE 10 MG PO TABS
10.0000 mg | ORAL_TABLET | Freq: Every day | ORAL | Status: DC
  Administered 2020-09-07: 10 mg via ORAL
  Filled 2020-09-06: qty 1

## 2020-09-06 MED ORDER — BLOOD GLUCOSE MONITOR SYSTEM W/DEVICE KIT
PACK | 0 refills | Status: DC
  Filled 2020-09-06: qty 1, 30d supply, fill #0

## 2020-09-06 MED ORDER — ASPIRIN 81 MG PO TBEC
81.0000 mg | DELAYED_RELEASE_TABLET | Freq: Every day | ORAL | 1 refills | Status: AC
  Filled 2020-09-06: qty 30, 30d supply, fill #0

## 2020-09-06 MED ORDER — AMLODIPINE BESYLATE 10 MG PO TABS
10.0000 mg | ORAL_TABLET | Freq: Every day | ORAL | 0 refills | Status: DC
  Filled 2020-09-06: qty 30, 30d supply, fill #0

## 2020-09-06 MED ORDER — LEVETIRACETAM 500 MG PO TABS
500.0000 mg | ORAL_TABLET | Freq: Two times a day (BID) | ORAL | 1 refills | Status: DC
Start: 2020-09-06 — End: 2020-09-07
  Filled 2020-09-06: qty 60, 30d supply, fill #0

## 2020-09-06 NOTE — Progress Notes (Signed)
LTM discontinued; no skin breakdown was seen. [facility] notified of D/C. 

## 2020-09-06 NOTE — Plan of Care (Signed)

## 2020-09-06 NOTE — Procedures (Addendum)
Patient Name: Jill Jefferson  MRN: [id number]  Epilepsy Attending: Lora Havens  Referring Physician/Provider: Dr Lesleigh Noe Duration: 09/05/2020 1201 to 09/06/2020 [id number]   Patient history: 60 year old female presenting with aphasia and confusion in the context of HHS with a blood glucose level of 829. EEG to evaluate for seizure   Level of alertness: Awake, asleep   AEDs during EEG study: LEV, VPA   Technical aspects: This EEG study was done with scalp electrodes positioned according to the 10-20 International system of electrode placement. Electrical activity was acquired at a sampling rate of '500Hz'$  and reviewed with a high frequency filter of '70Hz'$  and a low frequency filter of '1Hz'$ . EEG data were recorded continuously and digitally stored.    Description: The posterior dominant rhythm consists of 9 Hz activity of moderate voltage (25-35 uV) seen predominantly in posterior head regions, symmetric and reactive to eye opening and eye closing. Sleep was characterized by vertex waves, sleep spindles (12 to 14 Hz), maximal frontocentral region. EEG showed intermittent 2-'3hz'$  sharply contoured delta slowing in left hemisphere, maximal left temporal region which at times appears rhythmic    ABNORMALITY - Intermittent slow, left hemisphere, left hemisphere, maximal left temporal region     IMPRESSION: This study showed evidence of cortical dysfunction arising from left hemisphere, maximal left temporal region most likely due to underlying structural abnormality/stroke. Lateralized  rhythmic delta activity ( LRDA) is on the ictal-interictal continuum with low potential for seizure recurrence. No definite seizure were seen during this study.    Jessly Lebeck Barbra Sarks

## 2020-09-06 NOTE — Progress Notes (Signed)
Physical Therapy Treatment Patient Details Name: Jill Jefferson MRN: [id number] DOB: 1960-11-30 Today's Date: 09/06/2020    History of Present Illness 60 y/o female presented to ED on 8/25 for confusion. CBG on arrival >600 (up to 821). 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 PMH: cataracts, edema, diabetes, HTN, H.pylori infection    PT Comments    Pt mobilizing independently in room today. In hallway, pt drifted L consistently, she was aware of this and reports that her LLE feels heavier than her R. She was able to self correct. She also has poor peripheral vision L so worked on increasing scanning with ambulation. Pt ambulated 500' without AD as well as ascending 5 stairs with rail and alternating pattern. Pt was able to pathfind back to room with some increased time for problem solving. She can verbalize that she knows she needs to not drive upon d/c but states that she will have to . She also relays concerns about her neighbor and the inability to move. PT will continue to follow.    Follow Up Recommendations  No PT follow up     Equipment Recommendations  None recommended by PT    Recommendations for Other Services       Precautions / Restrictions Precautions Precautions: Fall Restrictions Weight Bearing Restrictions: No    Mobility  Bed Mobility               General bed mobility comments: pt out of bed    Transfers Overall transfer level: Modified independent Equipment used: None Transfers: Sit to/from Stand Sit to Stand: Modified independent (Device/Increase time)         General transfer comment: pt has been moving around in room mod I  Ambulation/Gait Ambulation/Gait assistance: Supervision Gait Distance (Feet): 500 Feet Assistive device: None Gait Pattern/deviations: Step-through pattern;Decreased stride length;Drifts right/left Gait velocity: decreased Gait velocity interpretation:  >2.62 ft/sec, indicative of community ambulatory General Gait Details: pt consistently drifts L, she is aware of this and states that her L leg feels heavier than the R. She also has impaired vision L, worked on scanning L Regulatory affairs officer Stairs: Yes Stairs assistance: Supervision Stair Management: Alternating pattern;Forwards;One rail Right Number of Stairs: 5 General stair comments: supervision for safety, safe with use of rail   Wheelchair Mobility    Modified Rankin (Stroke Patients Only) Modified Rankin (Stroke Patients Only) Pre-Morbid Rankin Score: No symptoms Modified Rankin: Moderately severe disability     Balance Overall balance assessment: Needs assistance Sitting-balance support: No upper extremity supported Sitting balance-Leahy Scale: Normal     Standing balance support: No upper extremity supported Standing balance-Leahy Scale: Good Standing balance comment: L bias                            Cognition Arousal/Alertness: Awake/alert Behavior During Therapy: WFL for tasks assessed/performed Overall Cognitive Status: Impaired/Different from baseline Area of Impairment: Safety/judgement                         Safety/Judgement: Decreased awareness of deficits     General Comments: pt can verbalize that she knows she will not be allowed to drive for awhile but states that she has to. Discussed her vision issues and she still did not seem to fully grasp the safety issues      Exercises      General Comments General  comments (skin integrity, edema, etc.): discussed safety issues and pt going to sister's house upon initial d/c      Pertinent Vitals/Pain Pain Assessment: No/denies pain Faces Pain Scale: No hurt    Home Living                      Prior Function            PT Goals (current goals can now be found in the care plan section) Acute Rehab PT Goals Patient Stated Goal: To move apartments PT Goal  Formulation: With patient Time For Goal Achievement: 09/16/20 Potential to Achieve Goals: Good Progress towards PT goals: Progressing toward goals    Frequency    Min 3X/week      PT Plan Current plan remains appropriate    Co-evaluation              AM-PAC PT "6 Clicks" Mobility   Outcome Measure  Help needed turning from your back to your side while in a flat bed without using bedrails?: None Help needed moving from lying on your back to sitting on the side of a flat bed without using bedrails?: None Help needed moving to and from a bed to a chair (including a wheelchair)?: None Help needed standing up from a chair using your arms (e.g., wheelchair or bedside chair)?: None Help needed to walk in hospital room?: None Help needed climbing 3-5 steps with a railing? : A Little 6 Click Score: 23    End of Session   Activity Tolerance: Patient tolerated treatment well Patient left: with call bell/phone within reach;in bed Nurse Communication: Mobility status PT Visit Diagnosis: Unsteadiness on feet (R26.81);Muscle weakness (generalized) (M62.81)     Time: [id number] PT Time Calculation (min) (ACUTE ONLY): 21 min  Charges:  $Gait Training: 8-22 mins                     Leighton Roach, PT  Acute Rehab Services  Pager [phone] Office [place] 09/06/2020, 4:02 PM

## 2020-09-06 NOTE — Progress Notes (Signed)
Inpatient Diabetes Program Recommendations  AACE/ADA: New Consensus Statement on Inpatient Glycemic Control (2015)  Target Ranges:  Prepandial:   less than 140 mg/dL      Peak postprandial:   less than 180 mg/dL (1-2 hours)      Critically ill patients:  140 - 180 mg/dL   Lab Results  Component Value Date   GLUCAP 148 (H) 09/06/2020   HGBA1C >15.5 (H) 09/01/2020    Review of Glycemic Control  Diabetes history: DM2 Outpatient Diabetes medications: Metformin 1000 mg BID, Glucotrol 10 mg BID, (lantus 20 units at HS. Not taking)  Current orders for Inpatient glycemic control: Levemir 10 units qd, Novolog 3 units tid meal coverage, Novolog 0-9 units tid correction,0-5 units hs  Inpatient Diabetes Program Recommendations:   -Increase Novolog meal coverage to 5 units tid if eats 50% meals Secure chat sent to Dr. Florene Glen.  Thank you, Nani Gasser. Kaysin Brock, RN, MSN, CDE  Diabetes Coordinator Inpatient Glycemic Control Team Team Pager [phone] (8am-5pm) 09/06/2020 8:48 AM

## 2020-09-06 NOTE — Plan of Care (Signed)
Neurology Plan of Care  Patient has been seizure-free for 24 hours and is ready from neuro standpoint for hospital discharge. Recommend she be discharged on current AEDs (keppra '500mg'$  bid and depakote DR '250mg'$  q 8 hrs) with close f/u with outpatient neurology (referral placed). She should be cautioned not to drive for at least 6 mos after last seizure.  Su Monks, MD [facility] [phone]  If 7pm- 7am, please page neurology on call as listed in [place].

## 2020-09-06 NOTE — Discharge Summary (Signed)
Physician Discharge Summary  Jill Jefferson XBD:[id number] DOB: Jul 29, 1960 DOA: 09/01/2020  PCP: [name], FNP (Inactive)  Admit date: 09/01/2020 Discharge date: 09/06/2020  Time spent: 40 minutes  Recommendations for Outpatient Follow-up:  Follow outpatient CBC/CMP Follow with neurology outpatient for seizures - on keppra 500 mg BID and depakote 250 TID Follow with renal for AKI on CKD - proteinuria  Follow blood sugars outpatient with PCP - prescribed insulin on discharge, follow outpatient  Needs follow up of hyperlipidemia, LDL >200 Follow blood pressure outpatient  Consider cardiology referral for severe asymmetric LV hypertrophy of septal segment Encourage compliance with meds, she stopped them inexplicably  Discharge prepared, can d/c 8/31 after she receives her meds from [place] and gets insulin teaching.  Follow final pending neurology note.  Follow up repeat BMP as well.  Discharge Diagnoses:  Principal Problem:   Hyperosmolar hyperglycemic state (HHS) ([place]) Active Problems:   Diabetes mellitus ([place])   Bilateral lower extremity edema   Focal neurological deficit   AKI (acute kidney injury) ([place])   Acute metabolic encephalopathy   Seizure ([place])   Discharge Condition: stable  Diet recommendation: heart healthy  Filed Weights   09/01/20 2326  Weight: 65 kg    History of present illness:  Jill Jefferson is Jill Jefferson 60 y.o. female with medical history significant of cataracts, edema, diabetes, hypertension, H. pylori presents with altered mental status in the setting of hyperglycemia with HHS.  She initially presented in setting of confusion.  Her hair dresser noted she was confused on the phone.  She was speaking unintelligibly.  She was directed to the ED with concern for confusion, aphasia.  She was found to be severely hyperglycemic with HHS.  Neurology was c/s and EEG revealed seizures.   She's improved with treatment of her hyperglycemia and seizures.   Hospitalization complicated by AKI with renal function not back to her baseline.  Plan for discharge on keppra/depakote.  She'll discharge on insulin as well.    See below for additional details  Acute encephalopathy  Seizures >metabolic encephalopathy and seizures in setting of HHS  > MRI without acute abnormality, L occipital craniotomy with underlying chronic encephalomalacia > no major branch occlusion or significant proximal stenosis on MRA - Neurology consult - EEG with seizures, follow LTM EEG - RPR + with titer 1:2, notes she's been treating many times in the past - MRA head without major branch occlusion or significant proximal stenosis - ammonia wnl,high dose thiamine - continue keppra and depakote - will discharge at keppra 500 mg BID and depakote 250 mg TID (discussed with neurology, their final note is pending) - EEG 8/30 Am with evidence of cortical dysfunction arising from L hemisphere, maximal L temporal region most likely due to underlying structural abnormality/stroke, lateralized rhythmic delta activity is on ictal-interictal continuum with low potential for seizure recurrent - per neurology - ASA, Continue Statin  - Echocardiogram - with EF 65%, no RWMA, severe asymmetric LV hypertrophy, grade I diastolic dysfunction (no intracardiac source of embolism - consider TEE if clinically indicated - defer to neurology) - 1-39%, bilateral vertebral arteries  -  A1C pending, Lipid panel (LDL elevated) -> will increase to 40 mg  - Tele monitoring  - SLP/PT/OT - follow TSH (wnl), B12 (wnl), folate (wnl), VBG (no hypercarbia), RPR (+ 1:2), ammonia wnl - UA not c/w UTI, CXR without evidence of acute cardiopulm process   Per [facility] statutes, patients with seizures are not allowed to drive until  they have been seizure-free for six months. Use caution when using heavy equipment or power tools. Avoid working on ladders or at heights. Take showers instead of baths. Ensure the  water temperature is not too high on the home water heater. Do not go swimming alone. When caring for infants or small children, sit down when holding, feeding, or changing them to minimize risk of injury to the child in the event you have Cowen Pesqueira seizure. Also, Maintain good sleep hygiene. Avoid alcohol.   HHS > Patient presenting altered as above.  Found to have glucose in the 800s.  Serum osmolality 322.  Mildly elevated lactic acid 2.5 with Ej Pinson creatinine of 2.5 elevated from baseline of 1.6. - in setting of non adherence to therapy - insulin gtt d/c'd - A1c >15 - plan for discharge with 10 units lantus with 5 units aspart TID - follow outpatient  - carb mod - continue aspirin, statin  Dyslipidemia - LDL 214 - continue statin, needs follow up outpatient to ensure compliance   AKI on CKD IIIb > Creatinine elevated to 2.52 from baseline around 1.6. - creatinine continues to fluctuate, not quite back to baseline - initially improved, but then worsening - 2.76 -> repeat UA, follow urine lytes, bladder scan (pending)             - fena suggestive of intrinsic dz -> ATN?             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 Left ventricular ejection fraction, by estimation, is 65%. The left   ventricle has normal function. The left ventricle has no regional wall  motion abnormalities. There is severe asymmetric left ventricular  hypertrophy of the septal segment. Left  ventricular diastolic parameters are consistent with Grade I diastolic  dysfunction (impaired relaxation).   2. Right ventricular systolic function is normal. The right ventricular  size is normal. Tricuspid regurgitation signal is inadequate for assessing  PA pressure.   3. Deklynn Charlet small pericardial effusion is present.   4. The mitral valve is normal in structure. No evidence of mitral valve  regurgitation. No evidence of mitral stenosis.   5. The aortic valve is tricuspid. Aortic valve regurgitation is not  visualized. No aortic stenosis is present.   6. The inferior vena cava is normal in size with greater than 50%  respiratory variability, suggesting right atrial pressure of 3 mmHg.   Conclusion(s)/Recommendation(s): No intracardiac source of embolism  detected on this transthoracic study. Levita Monical transesophageal echocardiogram is  recommended to exclude cardiac source of embolism if clinically indicated  particularly in setting of suboptimal  apical window.   Carotid US Summary:  Right Carotid: Velocities in the right ICA are consistent with Rana Hochstein 1-39%  stenosis.   Left Carotid:  Velocities in the left ICA are consistent with Jaynie Hitch 1-39%  stenosis.   Vertebrals:  Bilateral vertebral arteries demonstrate antegrade flow.  Subclavians: Normal flow hemodynamics were seen in bilateral subclavian               arteries.   Procedures:   Consultations: neurology  Discharge Exam: Vitals:   09/06/20 1156 09/06/20 1618  BP: (!) 156/85 (!) 147/80  Pulse: 74 81  Resp: 14 14  Temp: 97.6 F (36.4 C) 98 F (36.7 C)  SpO2: 100% 100%   No complaints Feeling well Discussed d/c plan with sister  General: No acute distress. Cardiovascular: RRR Lungs: unlabored Abdomen: Soft, nontender, nondistended  Neurological:  Alert and oriented 3. Moves all extremities 4 . Cranial nerves II through XII grossly intact. Skin: Warm and dry. No rashes or lesions. Extremities: No clubbing or cyanosis. No edema.   Discharge Instructions   Discharge Instructions     Ambulatory referral to Nephrology   Complete by: As directed    Ambulatory referral to Neurology   Complete by: As directed    An appointment is requested in approximately: 2 weeks   Call MD for:  difficulty breathing, headache or visual disturbances   Complete by: As directed    Call MD for:  extreme fatigue   Complete by: As directed    Call MD for:  hives   Complete by: As directed    Call MD for:  persistant dizziness or light-headedness   Complete by: As directed    Call MD for:  persistant nausea and vomiting   Complete by: As directed    Call MD for:  redness, tenderness, or signs of infection (pain, swelling, redness, odor or green/yellow discharge around incision site)   Complete by: As directed    Call MD for:  severe uncontrolled pain   Complete by: As directed    Call MD for:  temperature >100.4   Complete by: As directed    Diet - low sodium heart healthy   Complete by: As directed    Discharge instructions   Complete by: As directed    You were seen for confusion.  This was due to seizures.  You've improved on 2 new seizures medicines, depakote and keppra.  Continue keppra 500 mg twice daily and depakote 250 mg three times Costella Schwarz day.  You'll need to follow up with neurology as an outpatient.  Follow the seizure precautions below.  Per [facility] statutes, patients with seizures are not allowed to drive until  they have been seizure-free for six months. Use caution when using heavy equipment or power tools. Avoid working on ladders or at heights. Take showers instead of baths. Ensure the water temperature is not too high on the home water heater. Do not go swimming alone. When caring for infants or small children, sit down  when holding, feeding, or changing them to minimize risk of injury to the child in the event you have Virgie Chery seizure. Also, Maintain good sleep hygiene. Avoid alcohol.  Your kidney function is abnormal.  It's better than when you initially presented, but is still not back to your baseline from about 1 year ago.  You have protein in your urine.  This is probably related to your diabetes, but it needs to be worked up further as an outpatient with renal (the kidney doctors) or your PCP.  Please call [facility] about Erek Kowal follow up appointment.    Your A1c is over 15.  This is extremely high.  I think your high blood sugars worsened Roselina Burgueno lot of the issues you presented with.  High blood sugars can cause or worsen seizures, worsen dehydration and cause kidney injury and lead to other serious medical issues.  We're restarting your lantus (long acting insulin) at 10 units daily.  Continue the fast acting aspart at 5 units, three times Camri Molloy day with meals (if you don't eat or eat less than normal, you should hold this insulin).  Follow up with your PCP for additional medication adjustments.    Resume your amlodipine for your blood pressure.  Follow this up with your PCP outpatient.   Return for new, recurrent, or worsening symptoms.  Please ask your PCP to request records from this hospitalization so they know what was done and what the next steps will be.   Increase activity slowly   Complete by: As directed       Allergies as of 09/06/2020   No Known Allergies      Medication List     STOP taking these medications    amoxicillin 500 MG tablet Commonly known as: AMOXIL   furosemide 20 MG tablet Commonly known as: LASIX   glipiZIDE 10 MG tablet Commonly known as: GLUCOTROL   hydrochlorothiazide 25 MG tablet Commonly known as: HYDRODIURIL   lisinopril 10 MG tablet Commonly known as: ZESTRIL   magnesium 30 MG tablet   metFORMIN 1000 MG tablet Commonly known as: GLUCOPHAGE    omeprazole 40 MG capsule Commonly known as: PRILOSEC       TAKE these medications    acetaminophen 500 MG tablet Commonly known as: TYLENOL Take 1 tablet (500 mg total) by mouth 2 (two) times Tavi Gaughran day.   amLODipine 5 MG tablet Commonly known as: NORVASC Take 2 tablets (10 mg total) by mouth daily. Start taking on: September 07, 2020 What changed:  medication strength how much to take   aspirin 81 MG EC tablet Take 1 tablet (81 mg total) by mouth daily. Swallow whole. Start taking on: September 07, 2020   atorvastatin 40 MG tablet Commonly known as: LIPITOR Take 1 tablet (40 mg total) by mouth daily. Start taking on: September 07, 2020 What changed:  medication strength how much to take   blood glucose meter kit and supplies Dispense based on patient and insurance preference. Use up to four times daily as directed. (FOR ICD-10 E10.9, E11.9).   blood glucose meter kit and supplies Kit Dispense based on patient and insurance preference. Use up to four times daily as directed.   divalproex 250 MG DR tablet Commonly known as: DEPAKOTE Take 1 tablet (250 mg total) by mouth every 8 (eight) hours.   insulin aspart 100 UNIT/ML injection Commonly known as: novoLOG Inject 5 Units into the skin 3 (three) times daily with meals. Start taking on: September 07, 2020   insulin glargine 100 UNIT/ML injection Commonly known as: Lantus Inject 0.1 mLs (10 Units total) into the skin at bedtime. What changed: how much to take   levETIRAcetam 500 MG tablet Commonly known as: KEPPRA Take 1 tablet (500 mg total) by mouth 2 (two) times daily.       No Known Allergies    The results of significant diagnostics from this hospitalization (including imaging, microbiology, ancillary and laboratory) are listed below for reference.    Significant Diagnostic Studies: DG Chest 2 View  Result Date: 09/01/2020 CLINICAL DATA:  Altered mental status EXAM: CHEST - 2 VIEW COMPARISON:  None.  FINDINGS:  The cardiomediastinal silhouette is normal. There is no focal consolidation or pulmonary edema. There is no pleural effusion or pneumothorax. There is no acute osseous abnormality. IMPRESSION: No radiographic evidence of acute cardiopulmonary process. Electronically Signed   By: Valetta Mole M.D.   On: 09/01/2020 15:40   DG Abd 1 View  Result Date: 09/02/2020 CLINICAL DATA:  60 year old female with constipation. EXAM: ABDOMEN - 1 VIEW COMPARISON:  None. FINDINGS: The bowel gas pattern is normal. No radio-opaque calculi or other significant radiographic abnormality are seen. IMPRESSION: Nonobstructive bowel gas pattern. Electronically Signed   By: Ruthann Cancer M.D.   On: 09/02/2020 12:53   CT Head Wo Contrast  Result Date: 09/01/2020 CLINICAL DATA:  Mental status change, unknown cause EXAM: CT HEAD WITHOUT CONTRAST TECHNIQUE: Contiguous axial images were obtained from the base of the skull through the vertex without intravenous contrast. COMPARISON:  None. FINDINGS: Brain: Encephalomalacia in the left occipital lobe, presumably old infarct. No acute intracranial abnormality. Specifically, no hemorrhage, hydrocephalus, mass lesion, acute infarction, or significant intracranial injury. Vascular: No hyperdense vessel or unexpected calcification. Skull: No acute calvarial abnormality. 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 No acute intracranial abnormality. Electronically Signed   By: Rolm Baptise M.D.   On: 09/01/2020 18:06   MR ANGIO HEAD WO CONTRAST  Result Date: 09/03/2020 CLINICAL DATA:  TIA. EXAM: MRA HEAD WITHOUT CONTRAST TECHNIQUE: Angiographic images of the Circle of Willis were acquired using MRA technique without intravenous contrast. COMPARISON:  No pertinent prior exam. FINDINGS: Anterior circulation: The internal carotid arteries are widely patent from skull base to carotid termini. ACAs and MCAs are patent without evidence of  Maleak Brazzel proximal branch occlusion or significant proximal stenosis. The right A1 segment is hypoplastic. No aneurysm is identified. Posterior circulation: The included intracranial portions of the vertebral arteries are widely patent to the basilar and codominant. Patent AICA and SCA origins are seen bilaterally. The basilar artery is widely patent. There is Ellaina Schuler subtly bulbous configuration of the distal basilar artery with the appearance on reformats suggestive of Savion Washam small fenestration (Ariella Voit normal variant). Khamari Sheehan discrete aneurysm is not evident. There are right larger than left posterior communicating arteries with hypoplasia of the right P1 segment. Both PCAs are patent with mild irregularity but no flow limiting proximal stenosis. Anatomic variants: Hypoplastic right A1 segment. Predominantly fetal type origin of the right PCA. Distal basilar artery fenestration. IMPRESSION: No major branch occlusion or significant proximal stenosis. Electronically Signed   By: Logan Bores M.D.   On: 09/03/2020 10:03   MR BRAIN WO CONTRAST  Result Date: 09/02/2020 CLINICAL DATA:  Acute neuro deficit.  Altered mental status EXAM: MRI HEAD WITHOUT CONTRAST TECHNIQUE: Multiplanar, multiecho pulse sequences of the brain and surrounding structures were obtained without intravenous contrast. COMPARISON:  CT head 09/01/2020 FINDINGS: Brain: Ventricle size and cerebral volume normal. Enlarged sella filled with CSF. Probable empty sella which is likely an incidental finding. Chronic encephalomalacia left occipital lobe with overlying craniotomy. No mass-effect or edema. Negative for acute infarct chronic blood products in the left occipital encephalomalacia. Vascular: Normal arterial flow voids Skull and upper cervical spine: Left occipital craniotomy Sinuses/Orbits: Mucosal edema paranasal sinuses. Right cataract extraction Other: None IMPRESSION: No acute abnormality Left occipital craniotomy with underlying chronic encephalomalacia.  Electronically Signed   By: Franchot Gallo M.D.   On: 09/02/2020 15:32   US RENAL  Result Date: 09/02/2020 CLINICAL DATA:  Acute renal injury. EXAM: RENAL /  URINARY TRACT ULTRASOUND COMPLETE COMPARISON:  X-ray abdomen 09/02/2020 FINDINGS: Right Kidney: Renal measurements: 9.4 x 3.7 x 3.9 cm = volume: 69 mL mL. Mild echogenicity increased. No mass or hydronephrosis visualized. Left Kidney: Renal measurements: 8.9 x 4.8 x 4 cm = volume: 88 mL mL. Mild echogenicity increased no mass or hydronephrosis visualized. Urinary: Appears normal for degree of bladder distention. Other: None. IMPRESSION: 1. 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 2. Slightly increased echogenicity of bilateral kidneys suggestive of renal parenchymal disease. Electronically Signed   By: Iven Finn M.D.   On: 09/02/2020 18:23   DG CHEST PORT 1 VIEW  Result Date: 09/04/2020 CLINICAL DATA:  Altered mental status, seizure. EXAM: PORTABLE CHEST 1 VIEW COMPARISON:  Chest x-ray dated 09/01/2020. FINDINGS: Heart size and mediastinal contours are within normal limits. Lungs are clear. No pleural effusion or pneumothorax is seen. Osseous structures about the chest are unremarkable. IMPRESSION: No active disease. No evidence of pneumonia or pulmonary edema. Electronically Signed   By: Franki Cabot M.D.   On: 09/04/2020 10:21   EEG adult  Result Date: 09/03/2020 Lora Havens, MD     09/03/2020  1:43 PM Patient Name: Arnie Maiolo MRN: [id number] Epilepsy Attending: Lora Havens Referring Physician/Provider: Dr Kerney Elbe Date: 09/03/2020 Duration: 22.37mns Patient history: 60year old female presenting with aphasia and confusion in the context of HHS with Sandara Tyree blood glucose level of 829. EEG to evaluate for seizure Level of alertness: Awake, asleep AEDs during EEG study: None Technical aspects: This EEG study was done with scalp electrodes positioned according to the 10-20 International system of electrode  placement. Electrical activity was acquired at Seann Genther sampling rate of '500Hz'  and reviewed with Jance Siek high frequency filter of '70Hz'  and Aylyn Wenzler low frequency filter of '1Hz' . EEG data were recorded continuously and digitally stored. Description: The posterior dominant rhythm consists of 9 Hz activity of moderate voltage (25-35 uV) seen predominantly in posterior head regions, symmetric and reactive to eye opening and eye closing. Sleep was characterized by vertex waves, sleep spindles (12 to 14 Hz), maximal frontocentral region. Lateralized periodic discharges were noted in left hemisphere, maximal left temporal region, at 1-'2hz'  which at times appeared rhythmic without definite evolution consistent with brief-ictal-interictal rhythmic discharges. Intermittent rhythmic 3-'5hz'  theta-delta slowing was also noted in left hemisphere, maximal left temporal region. Five seizure without clinical signs were also noted arising from left hemisphere, maximal left temporal region. First seizure was ongoing at onset of study on 09/03/2020 at 1134. This was followed by seizure at 1143, 1145, 1146 and 1154. Average duration of seizures was about 20-40 seconds. During seizure EEG showed 1-1.5 hz periodic sharp waves in left hemisphere, maximal left temporal region which gradually increased in amplitude as well as frequency increased to 2-2.'5hz' . There was also 5-'6hz'  theta slowing which gradually evolved into 2-'3hz'  delta slowing. Hyperventilation and photic stimulation were not performed.   ABNORMALITY - Seizure without clinical signs, left hemisphere, maximal left temporal region - Brief ictal-interictal rhythmic discharges, left hemisphere, maximal left temporal region  ( BIRDs) - Lateralized periodic discharges,left hemisphere, maximal left temporal region  (LPD) - Intermittent slow, left hemisphere, maximal left temporal region  ( IMPRESSION: This study showed five seizure without clinical signs arising from left hemisphere, maximal left temporal  region. First seizure was ongoing at onset of study on 09/03/2020 at 1134 followed by seizure at 1143, 1145, 1146 and 1154. Average duration of seizures was about 20-40 seconds. Brief ictal-interictal rhythmic discharges were  also noted left hemisphere, maximal left temporal region which are suggestive of high potential for seizure recurrence. Additionally, there is evidence of cortical dysfunction in left hemisphere, maximal left temporal region most likely due to underlying structural abnormality/stroke and seizure. Dr Curly Shores was notified. Priyanka Barbra Sarks   Overnight EEG with video  Result Date: 09/04/2020 Lora Havens, MD     09/05/2020  8:49 AM Patient Name: Jovonne Wilton MRN: [id number] Epilepsy Attending: Lora Havens Referring Physician/Provider: Dr Lesleigh Noe Duration: 09/03/2020 1201 to 09/04/2020 1201  Patient history: 60 year old female presenting with aphasia and confusion in the context of HHS with Sarabelle Genson blood glucose level of 829. EEG to evaluate for seizure  Level of alertness: Awake, asleep  AEDs during EEG study: LEV  Technical aspects: This EEG study was done with scalp electrodes positioned according to the 10-20 International system of electrode placement. Electrical activity was acquired at Pahoua Schreiner sampling rate of '500Hz'  and reviewed with Gearld Kerstein high frequency filter of '70Hz'  and Emmanuella Mirante low frequency filter of '1Hz' . EEG data were recorded continuously and digitally stored.  Description: The posterior dominant rhythm consists of 9 Hz activity of moderate voltage (25-35 uV) seen predominantly in posterior head regions, symmetric and reactive to eye opening and eye closing. Sleep was characterized by vertex waves, sleep spindles (12 to 14 Hz), maximal frontocentral region. Lateralized periodic discharges were noted in left hemisphere, maximal left temporal region, at 1-'2hz'  which at times appeared rhythmic without definite evolution consistent with brief-ictal-interictal rhythmic discharges. Intermittent  rhythmic 3-'5hz'  theta-delta slowing was also noted in left hemisphere, maximal left temporal region.  Seizure without clinical signs were also noted arising from left hemisphere, maximal left temporal region. At the beginning of study, 3-6 seizures wre noted/hour. Gradually the frequency improved and one seizure was seen every few hours. Average duration of seizures was about 20-40 seconds. During seizure EEG showed 1-1.5 hz periodic sharp waves in left hemisphere, maximal left temporal region which gradually increased in amplitude as well as frequency increased to 2-2.'5hz' . There was also 5-'6hz'  theta slowing which gradually evolved into 2-'3hz'  delta slowing.  Hyperventilation and photic stimulation were not performed.    ABNORMALITY - Seizure without clinical signs, left hemisphere, maximal left temporal region - Brief ictal-interictal rhythmic discharges, left hemisphere, maximal left temporal region  ( BIRDs) - Lateralized periodic discharges,left hemisphere, maximal left temporal region  (LPD) - Intermittent slow, left hemisphere, maximal left temporal region  (  IMPRESSION: This study showed seizure without clinical signs arising from left hemisphere, maximal left temporal region. t the beginning of study, 3-6 seizures wre noted/hour. Gradually the frequency improved and one seizure was seen every few hours.  Average duration of seizures was about 20-40 seconds. Brief ictal-interictal rhythmic discharges were also noted left hemisphere, maximal left temporal region which are suggestive of high potential for seizure recurrence. Additionally, there is evidence of cortical dysfunction in left hemisphere, maximal left temporal region most likely due to underlying structural abnormality/stroke and seizure.  Lora Havens   ECHOCARDIOGRAM COMPLETE  Result Date: 09/02/2020    ECHOCARDIOGRAM REPORT   Patient Name:   VENDELA TROUNG Date of Exam: 09/02/2020 Medical Rec #:  [id number]       Height:       60.0 in  Accession #:    [id number]      Weight:       143.3 lb Date of Birth:  1960-11-17        BSA:  1.620 m Patient Age:    53 years        BP:           162/78 mmHg Patient Gender: F               HR:           77 bpm. Exam Location:  Inpatient Procedure: 2D Echo, Cardiac Doppler, Color Doppler and Intracardiac            Opacification Agent Indications:    Stroke I63.9  History:        Patient has no prior history of Echocardiogram examinations.                 Risk Factors:Hypertension and Diabetes. Edema. Acute                 encephalopathy. Acute kidney injury.  Sonographer:    Darlina Sicilian RDCS Referring Phys: [id number] Candace Gallus MELVIN  Sonographer Comments: Technically difficult study due to poor echo windows and suboptimal apical window. IMPRESSIONS  1. Left ventricular ejection fraction, by estimation, is 65%. The left ventricle has normal function. The left ventricle has no regional wall motion abnormalities. 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 Left ventricular diastolic parameters are consistent with Grade I diastolic dysfunction (impaired relaxation).  2. Right ventricular systolic function is normal. The right ventricular size is normal. Tricuspid regurgitation signal is inadequate for assessing PA pressure.  3. Jaque Dacy small pericardial effusion is present.  4. The mitral valve is normal in structure. No evidence of mitral valve regurgitation. No evidence of mitral stenosis.  5. The aortic valve is tricuspid. Aortic valve regurgitation is not visualized. No aortic stenosis is present.  6. The inferior vena cava is normal in size with greater than 50% respiratory variability, suggesting right atrial pressure of 3 mmHg. Conclusion(s)/Recommendation(s): No intracardiac source of embolism detected on this transthoracic study. Nochum Fenter transesophageal echocardiogram is recommended to exclude cardiac source of embolism if clinically indicated particularly in setting of  suboptimal apical window. FINDINGS  Left Ventricle: Left ventricular ejection fraction, by estimation, is 65%. The left ventricle has normal function. The left ventricle has no regional wall motion abnormalities. Definity contrast agent was given IV to delineate the left ventricular endocardial borders. The left ventricular internal cavity size was normal in size. 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 Left ventricular diastolic parameters are consistent with Grade I diastolic dysfunction (impaired relaxation). Right Ventricle: The right ventricular size is normal. No increase in right ventricular wall thickness. Right ventricular systolic function is normal. Tricuspid regurgitation signal is inadequate for assessing PA pressure. Left Atrium: Left atrial size was normal in size. Right Atrium: Right atrial size was normal in size. Pericardium: Andromeda Poppen small pericardial effusion is present. Mitral Valve: The mitral valve is normal in structure. No evidence of mitral valve regurgitation. No evidence of mitral valve stenosis. Tricuspid Valve: The tricuspid valve is normal in structure. Tricuspid valve regurgitation is trivial. No evidence of tricuspid stenosis. Aortic Valve: The aortic valve is tricuspid. Aortic valve regurgitation is not visualized. No aortic stenosis is present. Pulmonic Valve: The pulmonic valve was normal in structure. Pulmonic valve regurgitation is trivial. No evidence of pulmonic stenosis. Aorta: The aortic root is normal in size and structure. Venous: The inferior vena cava is normal in size with greater than 50% respiratory variability, suggesting right atrial pressure of 3 mmHg. IAS/Shunts: No atrial level shunt detected by color  flow Doppler.  LEFT VENTRICLE PLAX 2D LVIDd:         4.10 cm  Diastology LVIDs:         2.20 cm  LV e' medial:    4.54 cm/s LV PW:         0.90 cm  LV E/e' medial:  14.4 LV IVS:        1.50 cm  LV e' lateral:   5.48 cm/s LVOT diam:      1.90 cm  LV E/e' lateral: 11.9 LV SV:         49 LV SV Index:   30 LVOT Area:     2.84 cm  RIGHT VENTRICLE TAPSE (M-mode): 1.8 cm LEFT ATRIUM             Index LA diam:        4.00 cm 2.47 cm/m LA Vol (A2C):   15.7 ml 9.69 ml/m LA Vol (A4C):   23.4 ml 14.44 ml/m LA Biplane Vol: 19.3 ml 11.91 ml/m  AORTIC VALVE LVOT Vmax:   94.20 cm/s LVOT Vmean:  61.300 cm/s LVOT VTI:    0.174 m  AORTA Ao Root diam: 3.10 cm Ao Asc diam:  2.80 cm MITRAL VALVE MV Area (PHT): 3.34 cm    SHUNTS MV Decel Time: 227 msec    Systemic VTI:  0.17 m MV E velocity: 65.45 cm/s  Systemic Diam: 1.90 cm MV Franshesca Chipman velocity: 84.35 cm/s MV E/Cambryn Charters ratio:  0.78 Cherlynn Kaiser MD Electronically signed by Cherlynn Kaiser MD Signature Date/Time: 09/02/2020/12:02:50 PM    Final    VAS US CAROTID (at [facility] and [facility] only)  Result Date: 09/02/2020 Carotid Arterial Duplex Study Patient Name:  LILLIANA TURNER  Date of Exam:   09/02/2020 Medical Rec #: [id number]        Accession #:    [id number] Date of Birth: [birth year]         Patient Gender: F Patient Age:   89 years Exam Location:  [facility] Procedure:      VAS US CAROTID Referring Phys: Sheppard Coil MELVIN --------------------------------------------------------------------------------  Indications:       CVA. Risk Factors:      Hypertension, Diabetes. Comparison Study:  No prior studies. Performing Technologist: Darlin Coco RDMS, RVT  Examination Guidelines: Nohemi Nicklaus complete evaluation includes B-mode imaging, spectral Doppler, color Doppler, and power Doppler as needed of all accessible portions of each vessel. Bilateral testing is considered an integral part of Victorious Cosio complete examination. Limited examinations for reoccurring indications may be performed as noted.  Right Carotid Findings: +----------+--------+--------+--------+----------------------------+--------+           PSV cm/sEDV cm/sStenosisPlaque Description          Comments  +----------+--------+--------+--------+----------------------------+--------+ CCA Prox  99      10                                                   +----------+--------+--------+--------+----------------------------+--------+ CCA Distal98      14                                                   +----------+--------+--------+--------+----------------------------+--------+ ICA Prox  60      14  1-39%   hyperechoic and heterogenous         +----------+--------+--------+--------+----------------------------+--------+ ICA Distal72      27                                                   +----------+--------+--------+--------+----------------------------+--------+ ECA       119     9                                                    +----------+--------+--------+--------+----------------------------+--------+ +----------+--------+-------+----------------+-------------------+           PSV cm/sEDV cmsDescribe        Arm Pressure (mmHG) +----------+--------+-------+----------------+-------------------+ Subclavian170            Multiphasic, WNL                    +----------+--------+-------+----------------+-------------------+ +---------+--------+--+--------+--+---------+ VertebralPSV cm/s78EDV cm/s35Antegrade +---------+--------+--+--------+--+---------+  Left Carotid Findings: +----------+--------+--------+--------+------------------+--------+           PSV cm/sEDV cm/sStenosisPlaque DescriptionComments +----------+--------+--------+--------+------------------+--------+ CCA Prox  129     15                                         +----------+--------+--------+--------+------------------+--------+ CCA Distal86      13                                         +----------+--------+--------+--------+------------------+--------+ ICA Prox  84      29      1-39%   heterogenous                +----------+--------+--------+--------+------------------+--------+ ICA Distal71      21                                         +----------+--------+--------+--------+------------------+--------+ ECA       72      12                                         +----------+--------+--------+--------+------------------+--------+ +----------+--------+--------+----------------+-------------------+           PSV cm/sEDV cm/sDescribe        Arm Pressure (mmHG) +----------+--------+--------+----------------+-------------------+ [id number]             Multiphasic, WNL                    +----------+--------+--------+----------------+-------------------+ +---------+--------+--+--------+--+---------+ VertebralPSV cm/s72EDV cm/s15Antegrade +---------+--------+--+--------+--+---------+   Summary: Right Carotid: Velocities in the right ICA are consistent with Arad Burston 1-39% stenosis. Left Carotid: Velocities in the left ICA are consistent with Clete Kuch 1-39% stenosis. Vertebrals:  Bilateral vertebral arteries demonstrate antegrade flow. Subclavians: Normal flow hemodynamics were seen in bilateral subclavian              arteries. *See table(s) above for measurements and observations.  Electronically signed by Antony Contras MD  on 09/02/2020 at 3:15:47 PM.    Final     Microbiology: Recent Results (from the past 240 hour(s))  Resp Panel by RT-PCR (Flu Maui Ahart&B, Covid) Nasopharyngeal Swab     Status: None   Collection Time: 09/01/20  3:27 PM   Specimen: Nasopharyngeal Swab; Nasopharyngeal(NP) swabs in vial transport medium  Result Value Ref Range Status   SARS Coronavirus 2 by RT PCR NEGATIVE NEGATIVE Final    Comment: (NOTE) SARS-CoV-2 target nucleic acids are NOT DETECTED.  The SARS-CoV-2 RNA is generally detectable in upper respiratory specimens during the acute phase of infection. The lowest concentration of SARS-CoV-2 viral copies this assay can detect is 138 copies/mL. Marlin Brys negative result does not  preclude SARS-Cov-2 infection and should not be used as the sole basis for treatment or other patient management decisions. Yulia Ulrich negative result may occur with  improper specimen collection/handling, submission of specimen other than nasopharyngeal swab, presence of viral mutation(s) within the areas targeted by this assay, and inadequate number of viral copies(<138 copies/mL). Kathleen Tamm negative result must be combined with clinical observations, patient history, and epidemiological information. The expected result is Negative.  Fact Sheet for Patients:  EntrepreneurPulse.com.au  Fact Sheet for Healthcare Providers:  IncredibleEmployment.be  This test is no t yet approved or cleared by the [place] FDA and  has been authorized for detection and/or diagnosis of SARS-CoV-2 by FDA under an Emergency Use Authorization (EUA). This EUA will remain  in effect (meaning this test can be used) for the duration of the COVID-19 declaration under Section 564(b)(1) of the Act, 21 U.S.C.section 360bbb-3(b)(1), unless the authorization is terminated  or revoked sooner.       Influenza Ringo Sherod by PCR NEGATIVE NEGATIVE Final   Influenza B by PCR NEGATIVE NEGATIVE Final    Comment: (NOTE) The Xpert Xpress SARS-CoV-2/FLU/RSV plus assay is intended as an aid in the diagnosis of influenza from Nasopharyngeal swab specimens and should not be used as Kandiss Ihrig sole basis for treatment. Nasal washings and aspirates are unacceptable for Xpert Xpress SARS-CoV-2/FLU/RSV testing.  Fact Sheet for Patients: EntrepreneurPulse.com.au  Fact Sheet for Healthcare Providers: IncredibleEmployment.be  This test is not yet approved or cleared by the [place] FDA and has been authorized for detection and/or diagnosis of SARS-CoV-2 by FDA under an Emergency Use Authorization (EUA). This EUA will remain in effect (meaning this test can be used) for the  duration of the COVID-19 declaration under Section 564(b)(1) of the Act, 21 U.S.C. section 360bbb-3(b)(1), unless the authorization is terminated or revoked.  Performed at [facility], [place] [address], [place], [place] [address]      Labs: Basic Metabolic Panel: Recent Labs  Lab 09/02/20 0630 09/02/20 1330 09/03/20 0145 09/04/20 0110 09/04/20 1508 09/05/20 1501 09/06/20 0146  NA 136   < > 135 132* 135 136 139  K 2.6*   < > 3.5 4.5 3.9 4.2 3.7  CL 100   < > 102 99 103 100 107  CO2 29   < > '27 28 28 29 27  ' GLUCOSE 133*   < > 109* 329* 226* 374* 90  BUN 13   < > 17 29* 29* 27* 30*  CREATININE 1.89*   < > 2.22* 2.76* 2.42* 2.10* 2.12*  CALCIUM 8.7*   < > 8.7* 8.5* 8.1* 7.8* 8.4*  MG 1.6*  --  2.0 1.8  --   --  1.6*  PHOS  --   --   --  3.4  --   --  3.9   < > = values in this interval not displayed.   Liver Function Tests: Recent Labs  Lab 09/01/20 2252 09/03/20 0145 09/04/20 0110 09/06/20 0146  AST '18 20 17 17  ' ALT '17 15 14 13  ' ALKPHOS 78 66 71 53  BILITOT 0.6 0.7 0.6 0.6  PROT 5.2* 4.7* 4.5* 4.2*  ALBUMIN 2.6* 2.2* 2.0* 1.8*   No results for input(s): LIPASE, AMYLASE in the last 168 hours. Recent Labs  Lab 09/03/20 0747  AMMONIA 16   CBC: Recent Labs  Lab 09/01/20 1450 09/01/20 1506 09/03/20 0145 09/04/20 0110 09/05/20 1025 09/06/20 0146  WBC 9.9  --  9.3 8.8 8.6 8.1  NEUTROABS 8.1*  --  4.8 4.5  --  4.2  HGB 12.8 13.6 11.3* 10.3* 11.3* 9.4*  HCT 38.3 40.0 32.6* 31.1* 34.3* 29.2*  MCV 80.6  --  78.4* 81.6 82.7 83.0  PLT 296  --  296 263 281 241   Cardiac Enzymes: Recent Labs  Lab 09/04/20 0110  CKTOTAL 120   BNP: BNP (last 3 results) Recent Labs    09/01/20 1449  BNP 49.6    ProBNP (last 3 results) No results for input(s): PROBNP in the last 8760 hours.  CBG: Recent Labs  Lab 09/05/20 2005 09/06/20 0343 09/06/20 0813 09/06/20 1159 09/06/20 1612  GLUCAP 170* 112* 148* 194* 202*       Signed:  Fayrene Helper MD.   [facility] 09/06/2020, 7:01 PM

## 2020-09-07 ENCOUNTER — Other Ambulatory Visit (HOSPITAL_COMMUNITY): Payer: Self-pay

## 2020-09-07 ENCOUNTER — Other Ambulatory Visit: Payer: Self-pay

## 2020-09-07 LAB — T.PALLIDUM AB, TOTAL: T Pallidum Abs: REACTIVE — AB

## 2020-09-07 LAB — GLUCOSE, CAPILLARY
Glucose-Capillary: 230 mg/dL — ABNORMAL HIGH (ref 70–99)
Glucose-Capillary: 117 mg/dL — ABNORMAL HIGH (ref 70–99)
Glucose-Capillary: 358 mg/dL — ABNORMAL HIGH (ref 70–99)

## 2020-09-07 MED ORDER — LEVETIRACETAM 500 MG PO TABS
500.0000 mg | ORAL_TABLET | Freq: Two times a day (BID) | ORAL | 1 refills | Status: DC
  Filled 2020-09-07: qty 60, 30d supply, fill #0

## 2020-09-07 MED ORDER — AMLODIPINE BESYLATE 10 MG PO TABS
10.0000 mg | ORAL_TABLET | Freq: Every day | ORAL | 0 refills | Status: DC
  Filled 2020-09-07: qty 30, 30d supply, fill #0

## 2020-09-07 MED ORDER — ACCU-CHEK GUIDE VI STRP
ORAL_STRIP | 0 refills | Status: DC
  Filled 2020-09-07: qty 100, 33d supply, fill #0

## 2020-09-07 MED ORDER — "INSULIN SYRINGE-NEEDLE U-100 30G X 5/16"" 0.3 ML MISC"
0 refills | Status: DC
  Filled 2020-09-07: qty 100, 33d supply, fill #0

## 2020-09-07 MED ORDER — INSULIN GLARGINE 100 UNIT/ML ~~LOC~~ SOLN
10.0000 [IU] | Freq: Every day | SUBCUTANEOUS | 1 refills | Status: DC
  Filled 2020-09-07: qty 10, 28d supply, fill #0
  Filled 2020-10-06: qty 10, 28d supply, fill #1

## 2020-09-07 MED ORDER — ACCU-CHEK GUIDE VI STRP
ORAL_STRIP | 0 refills | Status: DC
  Filled 2020-09-07: qty 100, 25d supply, fill #0

## 2020-09-07 MED ORDER — ACCU-CHEK SOFTCLIX LANCETS MISC
0 refills | Status: DC
  Filled 2020-09-07: qty 100, 25d supply, fill #0

## 2020-09-07 MED ORDER — TRUE METRIX METER W/DEVICE KIT
1.0000 | PACK | Freq: Four times a day (QID) | 0 refills | Status: DC
  Filled 2020-09-07: qty 1, 30d supply, fill #0

## 2020-09-07 MED ORDER — INSULIN ASPART 100 UNIT/ML IJ SOLN
5.0000 [IU] | Freq: Three times a day (TID) | INTRAMUSCULAR | 1 refills | Status: DC
  Filled 2020-09-07: qty 10, 28d supply, fill #0
  Filled 2020-10-06: qty 10, 28d supply, fill #1

## 2020-09-07 MED ORDER — "INSULIN SYRINGE-NEEDLE U-100 30G X 1/2"" 0.3 ML MISC"
0 refills | Status: DC
  Filled 2020-09-07: qty 100, 25d supply, fill #0

## 2020-09-07 MED ORDER — ATORVASTATIN CALCIUM 40 MG PO TABS
40.0000 mg | ORAL_TABLET | Freq: Every day | ORAL | 1 refills | Status: DC
  Filled 2020-09-07: qty 30, 30d supply, fill #0

## 2020-09-07 MED ORDER — DIVALPROEX SODIUM 250 MG PO DR TAB
250.0000 mg | DELAYED_RELEASE_TABLET | Freq: Three times a day (TID) | ORAL | 1 refills | Status: DC
  Filled 2020-09-07: qty 90, 30d supply, fill #0

## 2020-09-07 NOTE — Progress Notes (Signed)
Occupational Therapy Treatment Patient Details Name: Jill Jefferson MRN: [id number] DOB: 11-06-1960 Today's Date: 09/07/2020    History of present illness 60 y/o female presented to ED on 8/25 for confusion. CBG on arrival >600 (up to 821). 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 PMH: cataracts, edema, diabetes, HTN, H.pylori infection   OT comments  Patient scheduled for discharge this date.  Patient requesting to take a shower, tele called and notified, and OT notified nurse tech regarding shower.  Patient's IV site covered, and patient showered with setup only.  She demonstrated good safety, and no further acute OT needs exist.   Follow Up Recommendations  No OT follow up    Equipment Recommendations  None recommended by OT    Recommendations for Other Services      Precautions / Restrictions Precautions Precautions: None Restrictions Weight Bearing Restrictions: No       Mobility Bed Mobility Overal bed mobility: Modified Independent               Patient Response: Cooperative  Transfers Overall transfer level: Independent                    Balance Overall balance assessment: No apparent balance deficits (not formally assessed)                                         ADL either performed or assessed with clinical judgement   ADL Overall ADL's : Modified independent                                       General ADL Comments: patient able to take a shower this date with setup only     Vision Baseline Vision/History: 1 Wears glasses Ability to See in Adequate Light: 0 Adequate Patient Visual Report: No change from baseline     Perception     Praxis      Cognition Arousal/Alertness: Awake/alert Behavior During Therapy: WFL for tasks assessed/performed                           Following Commands: Follows one step commands  consistently Safety/Judgement: Decreased awareness of safety- minimal                                Pertinent Vitals/ Pain       Pain Assessment: No/denies pain  Home Living Family/patient expects to be discharged to:: Private residence Living Arrangements: Alone Available Help at Discharge: Family;Available PRN/intermittently Type of Home: House Home Access: Stairs to enter     Home Layout: Multi-level     Bathroom Shower/Tub: Tub/shower unit;Walk-in shower                Lives With: Alone    Prior Functioning/Environment Level of Independence: Independent            Frequency  Min 2X/week        Progress Toward Goals  OT Goals(current goals can now be found in the care plan section)     Acute Rehab OT Goals Patient Stated Goal: Take a shower and go home OT Goal Formulation: With patient Time For Goal  Achievement: 09/16/20 Potential to Achieve Goals: Good  Plan      Co-evaluation                 AM-PAC OT "6 Clicks" Daily Activity     Outcome Measure   Help from another person eating meals?: None Help from another person taking care of personal grooming?: None Help from another person toileting, which includes using toliet, bedpan, or urinal?: None Help from another person bathing (including washing, rinsing, drying)?: None Help from another person to put on and taking off regular upper body clothing?: None Help from another person to put on and taking off regular lower body clothing?: None 6 Click Score: 24    End of Session    OT Visit Diagnosis: Unsteadiness on feet (R26.81)   Activity Tolerance Patient tolerated treatment well   Patient Left with call bell/phone within reach;in chair   Nurse Communication          Time: [id number] OT Time Calculation (min): 25 min  Charges: OT General Charges $OT Visit: 1 Visit OT Treatments $Self Care/Home Management : 23-37 mins  09/07/2020  RP, OTR/L  Acute  Rehabilitation Services  Office:  [phone]    Metta Clines 09/07/2020, 10:12 AM

## 2020-09-07 NOTE — Progress Notes (Signed)
Discharge instructions given. Patient verbalized understanding and all questions were answered.  ?

## 2020-09-07 NOTE — Progress Notes (Signed)
Patient stable for discharge, please see DC summary by Dr Florene Glen 09/06/20  Jill Scalzo M. Cruzita Lederer, MD, PhD [facility]  Between 7 am - 7 pm you can contact me via Amion (for emergencies) or [place] (non urgent matters).  I am not available 7 pm - 7 am, please contact night coverage MD/APP via Amion

## 2020-09-14 ENCOUNTER — Ambulatory Visit: Payer: 59 | Admitting: Nurse Practitioner

## 2020-09-22 ENCOUNTER — Other Ambulatory Visit (HOSPITAL_BASED_OUTPATIENT_CLINIC_OR_DEPARTMENT_OTHER): Payer: Self-pay

## 2020-09-29 ENCOUNTER — Other Ambulatory Visit: Payer: Self-pay

## 2020-09-29 MED ORDER — DIVALPROEX SODIUM ER 250 MG PO TB24
ORAL_TABLET | ORAL | 2 refills | Status: DC
  Filled 2020-09-29 – 2020-10-06 (×2): qty 90, 30d supply, fill #0
  Filled 2020-10-24 – 2020-11-17 (×3): qty 90, 30d supply, fill #1
  Filled 2021-02-08: qty 90, 30d supply, fill #2
  Filled 2021-02-09: qty 90, 30d supply, fill #0

## 2020-09-29 MED ORDER — AMLODIPINE BESYLATE 10 MG PO TABS
ORAL_TABLET | Freq: Every day | ORAL | 2 refills | Status: DC
  Filled 2020-09-29 – 2020-10-06 (×2): qty 30, 30d supply, fill #0
  Filled 2020-10-24 – 2020-11-17 (×3): qty 30, 30d supply, fill #1
  Filled 2021-02-08: qty 30, 30d supply, fill #2
  Filled 2021-02-09: qty 30, 30d supply, fill #0

## 2020-10-06 ENCOUNTER — Other Ambulatory Visit: Payer: Self-pay | Admitting: Internal Medicine

## 2020-10-06 ENCOUNTER — Other Ambulatory Visit: Payer: Self-pay

## 2020-10-10 ENCOUNTER — Other Ambulatory Visit: Payer: Self-pay | Admitting: Family Medicine

## 2020-10-10 ENCOUNTER — Other Ambulatory Visit: Payer: Self-pay

## 2020-10-24 ENCOUNTER — Other Ambulatory Visit: Payer: Self-pay

## 2020-10-31 ENCOUNTER — Other Ambulatory Visit: Payer: Self-pay

## 2020-11-07 ENCOUNTER — Other Ambulatory Visit: Payer: Self-pay

## 2020-11-09 ENCOUNTER — Other Ambulatory Visit: Payer: Self-pay

## 2020-11-16 ENCOUNTER — Other Ambulatory Visit: Payer: Self-pay

## 2020-11-17 ENCOUNTER — Other Ambulatory Visit: Payer: Self-pay

## 2020-11-18 ENCOUNTER — Other Ambulatory Visit: Payer: Self-pay

## 2021-02-09 ENCOUNTER — Other Ambulatory Visit: Payer: Self-pay

## 2021-02-10 ENCOUNTER — Other Ambulatory Visit: Payer: Self-pay

## 2021-03-16 ENCOUNTER — Other Ambulatory Visit: Payer: Self-pay

## 2021-03-16 MED ORDER — AMLODIPINE BESYLATE 10 MG PO TABS
ORAL_TABLET | ORAL | 5 refills | Status: DC
  Filled 2021-03-16 – 2021-03-24 (×2): qty 30, 30d supply, fill #0

## 2021-03-16 MED ORDER — DIVALPROEX SODIUM ER 250 MG PO TB24
ORAL_TABLET | ORAL | 5 refills | Status: DC
  Filled 2021-03-16: qty 90, 90d supply, fill #0
  Filled 2021-03-18: qty 30, 30d supply, fill #0

## 2021-03-17 ENCOUNTER — Other Ambulatory Visit: Payer: Self-pay

## 2021-03-20 ENCOUNTER — Other Ambulatory Visit: Payer: Self-pay

## 2021-03-24 ENCOUNTER — Other Ambulatory Visit: Payer: Self-pay

## 2021-03-28 ENCOUNTER — Other Ambulatory Visit: Payer: Self-pay

## 2021-03-28 ENCOUNTER — Other Ambulatory Visit (HOSPITAL_BASED_OUTPATIENT_CLINIC_OR_DEPARTMENT_OTHER): Payer: Self-pay

## 2021-03-28 MED ORDER — AMLODIPINE BESYLATE 10 MG PO TABS
ORAL_TABLET | Freq: Every day | ORAL | 2 refills | Status: DC
  Filled 2021-03-28: qty 30, 30d supply, fill #0

## 2021-03-28 MED ORDER — AMLODIPINE BESYLATE 10 MG PO TABS
ORAL_TABLET | ORAL | 2 refills | Status: DC
  Filled 2021-03-28: qty 30, 30d supply, fill #0

## 2021-05-10 ENCOUNTER — Emergency Department (HOSPITAL_COMMUNITY): Payer: BC Managed Care – PPO

## 2021-05-10 ENCOUNTER — Inpatient Hospital Stay (HOSPITAL_COMMUNITY)
Admission: EM | Admit: 2021-05-10 | Discharge: 2021-05-13 | DRG: 101 | Disposition: A | Discharge status: Home / Self Care | Payer: BC Managed Care – PPO | Attending: Family Medicine | Admitting: Family Medicine

## 2021-05-10 DIAGNOSIS — I129 Hypertensive chronic kidney disease with stage 1 through stage 4 chronic kidney disease, or unspecified chronic kidney disease: Secondary | ICD-10-CM | POA: Diagnosis not present

## 2021-05-10 DIAGNOSIS — G9389 Other specified disorders of brain: Secondary | ICD-10-CM | POA: Diagnosis not present

## 2021-05-10 DIAGNOSIS — R29818 Other symptoms and signs involving the nervous system: Secondary | ICD-10-CM | POA: Diagnosis not present

## 2021-05-10 DIAGNOSIS — N184 Chronic kidney disease, stage 4 (severe): Secondary | ICD-10-CM | POA: Diagnosis present

## 2021-05-10 DIAGNOSIS — R569 Unspecified convulsions: Secondary | ICD-10-CM

## 2021-05-10 DIAGNOSIS — Z743 Need for continuous supervision: Secondary | ICD-10-CM | POA: Diagnosis not present

## 2021-05-10 DIAGNOSIS — G9341 Metabolic encephalopathy: Secondary | ICD-10-CM | POA: Diagnosis not present

## 2021-05-10 DIAGNOSIS — Z8673 Personal history of transient ischemic attack (TIA), and cerebral infarction without residual deficits: Secondary | ICD-10-CM

## 2021-05-10 DIAGNOSIS — R739 Hyperglycemia, unspecified: Secondary | ICD-10-CM | POA: Diagnosis not present

## 2021-05-10 DIAGNOSIS — Z833 Family history of diabetes mellitus: Secondary | ICD-10-CM

## 2021-05-10 DIAGNOSIS — G40101 Localization-related (focal) (partial) symptomatic epilepsy and epileptic syndromes with simple partial seizures, not intractable, with status epilepticus: Secondary | ICD-10-CM | POA: Diagnosis not present

## 2021-05-10 DIAGNOSIS — I10 Essential (primary) hypertension: Secondary | ICD-10-CM | POA: Diagnosis present

## 2021-05-10 DIAGNOSIS — Z794 Long term (current) use of insulin: Secondary | ICD-10-CM | POA: Diagnosis not present

## 2021-05-10 DIAGNOSIS — E1165 Type 2 diabetes mellitus with hyperglycemia: Secondary | ICD-10-CM

## 2021-05-10 DIAGNOSIS — E1122 Type 2 diabetes mellitus with diabetic chronic kidney disease: Secondary | ICD-10-CM | POA: Diagnosis present

## 2021-05-10 DIAGNOSIS — G40909 Epilepsy, unspecified, not intractable, without status epilepticus: Secondary | ICD-10-CM | POA: Diagnosis not present

## 2021-05-10 DIAGNOSIS — G40901 Epilepsy, unspecified, not intractable, with status epilepticus: Principal | ICD-10-CM | POA: Insufficient documentation

## 2021-05-10 DIAGNOSIS — E8809 Other disorders of plasma-protein metabolism, not elsewhere classified: Secondary | ICD-10-CM | POA: Diagnosis present

## 2021-05-10 DIAGNOSIS — E1129 Type 2 diabetes mellitus with other diabetic kidney complication: Secondary | ICD-10-CM | POA: Diagnosis not present

## 2021-05-10 DIAGNOSIS — E778 Other disorders of glycoprotein metabolism: Secondary | ICD-10-CM | POA: Diagnosis present

## 2021-05-10 DIAGNOSIS — N179 Acute kidney failure, unspecified: Secondary | ICD-10-CM | POA: Diagnosis not present

## 2021-05-10 DIAGNOSIS — I160 Hypertensive urgency: Secondary | ICD-10-CM | POA: Diagnosis present

## 2021-05-10 DIAGNOSIS — Z79899 Other long term (current) drug therapy: Secondary | ICD-10-CM | POA: Diagnosis not present

## 2021-05-10 DIAGNOSIS — Z8249 Family history of ischemic heart disease and other diseases of the circulatory system: Secondary | ICD-10-CM | POA: Diagnosis not present

## 2021-05-10 DIAGNOSIS — E1169 Type 2 diabetes mellitus with other specified complication: Secondary | ICD-10-CM | POA: Diagnosis not present

## 2021-05-10 DIAGNOSIS — R404 Transient alteration of awareness: Secondary | ICD-10-CM | POA: Diagnosis not present

## 2021-05-10 DIAGNOSIS — R6889 Other general symptoms and signs: Secondary | ICD-10-CM | POA: Diagnosis not present

## 2021-05-10 HISTORY — DX: Unspecified convulsions: R56.9

## 2021-05-10 HISTORY — DX: Epilepsy, unspecified, not intractable, with status epilepticus: G40.901

## 2021-05-10 LAB — DIFFERENTIAL
Abs Immature Granulocytes: 0.03 10*3/uL (ref 0.00–0.07)
Basophils Absolute: 0.1 10*3/uL (ref 0.0–0.1)
Basophils Relative: 1 %
Eosinophils Absolute: 0.1 10*3/uL (ref 0.0–0.5)
Eosinophils Relative: 2 %
Immature Granulocytes: 0 %
Lymphocytes Relative: 41 %
Lymphs Abs: 2.9 10*3/uL (ref 0.7–4.0)
Monocytes Absolute: 0.4 10*3/uL (ref 0.1–1.0)
Monocytes Relative: 6 %
Neutro Abs: 3.5 10*3/uL (ref 1.7–7.7)
Neutrophils Relative %: 50 %

## 2021-05-10 LAB — I-STAT CHEM 8, ED
BUN: 36 mg/dL — ABNORMAL HIGH (ref 6–20)
Calcium, Ion: 1.12 mmol/L — ABNORMAL LOW (ref 1.15–1.40)
Chloride: 102 mmol/L (ref 98–111)
Creatinine, Ser: 2.9 mg/dL — ABNORMAL HIGH (ref 0.44–1.00)
Glucose, Bld: 316 mg/dL — ABNORMAL HIGH (ref 70–99)
HCT: 40 % (ref 36.0–46.0)
Hemoglobin: 13.6 g/dL (ref 12.0–15.0)
Potassium: 4.5 mmol/L (ref 3.5–5.1)
Sodium: 133 mmol/L — ABNORMAL LOW (ref 135–145)
TCO2: 26 mmol/L (ref 22–32)

## 2021-05-10 LAB — CBG MONITORING, ED: Glucose-Capillary: 289 mg/dL — ABNORMAL HIGH (ref 70–99)

## 2021-05-10 LAB — COMPREHENSIVE METABOLIC PANEL
ALT: 10 U/L (ref 0–44)
AST: 16 U/L (ref 15–41)
Albumin: 3.4 g/dL — ABNORMAL LOW (ref 3.5–5.0)
Alkaline Phosphatase: 82 U/L (ref 38–126)
Anion gap: 10 (ref 5–15)
BUN: 33 mg/dL — ABNORMAL HIGH (ref 6–20)
CO2: 25 mmol/L (ref 22–32)
Calcium: 9.1 mg/dL (ref 8.9–10.3)
Chloride: 100 mmol/L (ref 98–111)
Creatinine, Ser: 2.86 mg/dL — ABNORMAL HIGH (ref 0.44–1.00)
GFR, Estimated: 18 mL/min — ABNORMAL LOW (ref >60)
Glucose, Bld: 306 mg/dL — ABNORMAL HIGH (ref 70–99)
Potassium: 4.6 mmol/L (ref 3.5–5.1)
Sodium: 135 mmol/L (ref 135–145)
Total Bilirubin: 0.6 mg/dL (ref 0.3–1.2)
Total Protein: 6.5 g/dL (ref 6.5–8.1)

## 2021-05-10 LAB — CBC
HCT: 39.6 % (ref 36.0–46.0)
Hemoglobin: 12.7 g/dL (ref 12.0–15.0)
MCH: 26.6 pg (ref 26.0–34.0)
MCHC: 32.1 g/dL (ref 30.0–36.0)
MCV: 83 fL (ref 80.0–100.0)
Platelets: 232 10*3/uL (ref 150–400)
RBC: 4.77 MIL/uL (ref 3.87–5.11)
RDW: 12.5 % (ref 11.5–15.5)
WBC: 7.1 10*3/uL (ref 4.0–10.5)
nRBC: 0 % (ref 0.0–0.2)

## 2021-05-10 LAB — PROTIME-INR
INR: 0.9 (ref 0.8–1.2)
Prothrombin Time: 12.5 seconds (ref 11.4–15.2)

## 2021-05-10 LAB — I-STAT BETA HCG BLOOD, ED (MC, WL, AP ONLY): I-stat hCG, quantitative: 6.5 m[IU]/mL — ABNORMAL HIGH (ref <5)

## 2021-05-10 LAB — APTT: aPTT: 27 seconds (ref 24–36)

## 2021-05-10 LAB — VALPROIC ACID LEVEL: Valproic Acid Lvl: 63 ug/mL (ref 50.0–100.0)

## 2021-05-10 LAB — MAGNESIUM: Magnesium: 1.9 mg/dL (ref 1.7–2.4)

## 2021-05-10 IMAGING — CT CT HEAD CODE STROKE
3 of 4 series · 13 of 47 positions shown, 15 images · non-contrast
Comparison: CT head [DATE].

CLINICAL DATA: Code stroke.  Neuro deficit, acute, stroke suspected



[Series 3: head wo · axial · 0.32mm/px · z∈[-117,+3]mm · 7 of 32 slices shown, 9 images]
[im 4/32  brain]
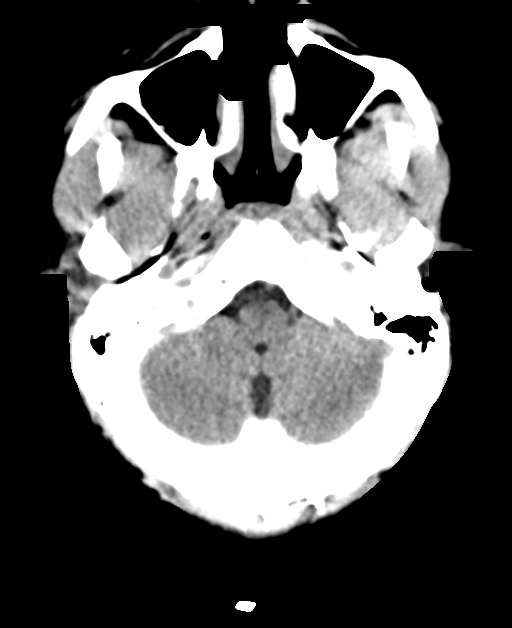
[im 4/32  bone]
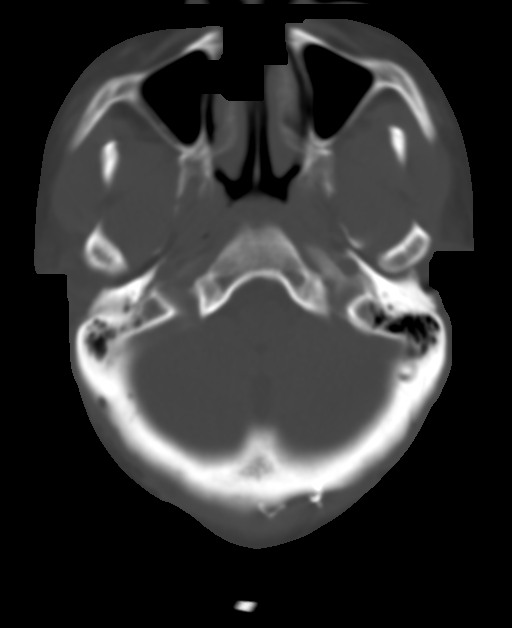
[im 8/32  brain]
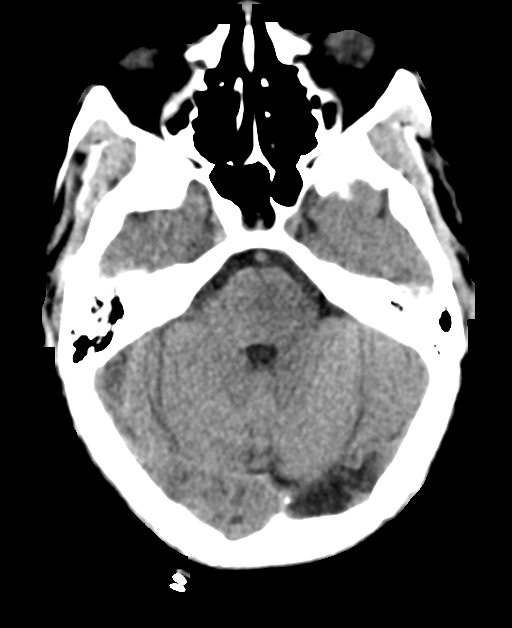
[im 12/32  brain]
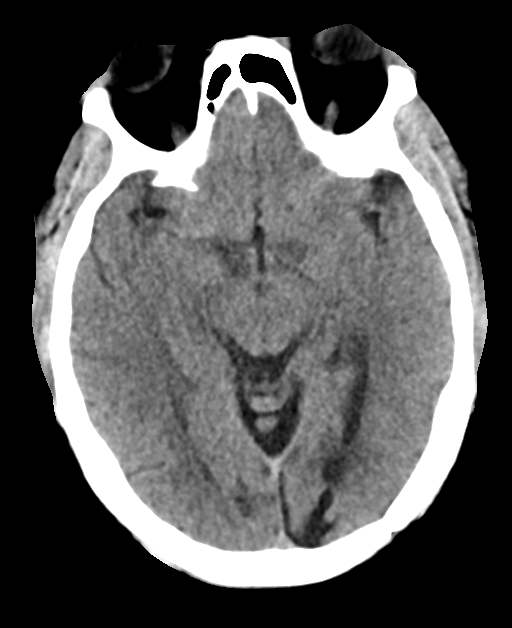
[im 16/32  brain]
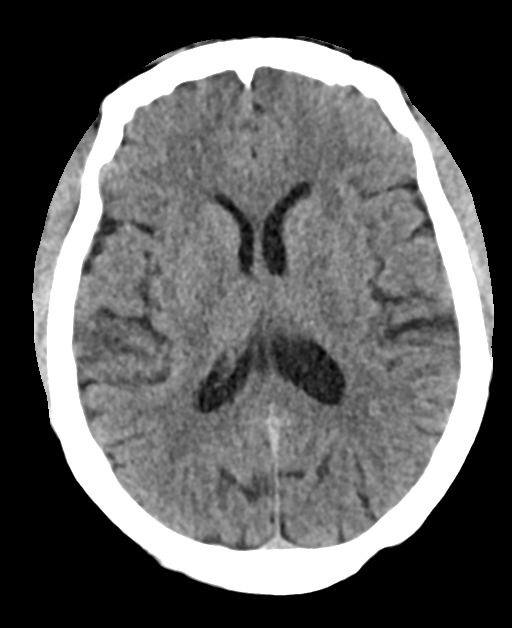
[im 20/32  brain]
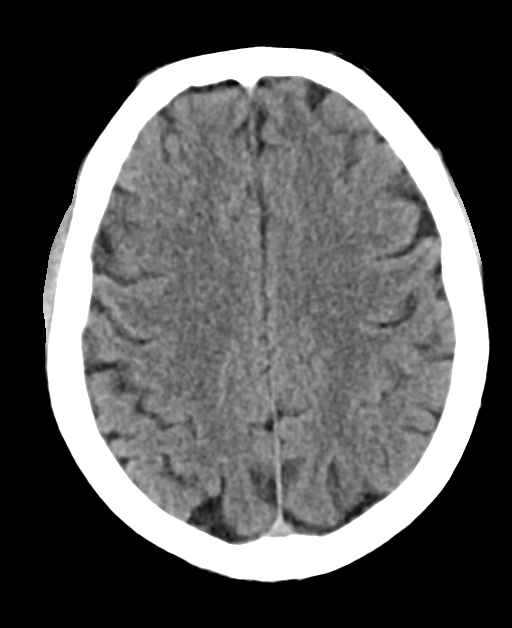
[im 20/32  bone]
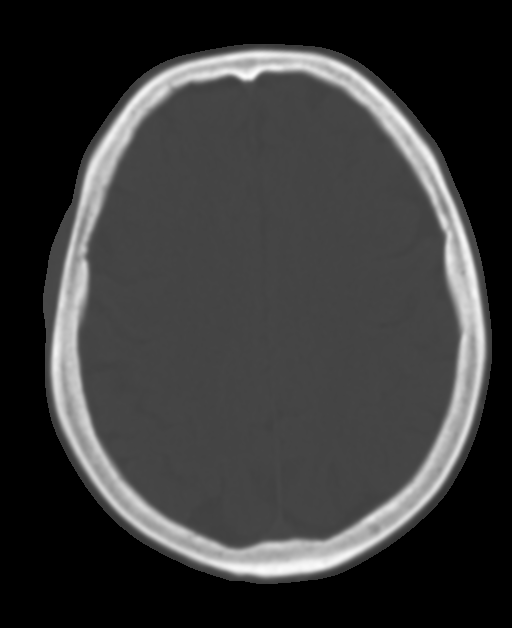
[im 24/32  brain]
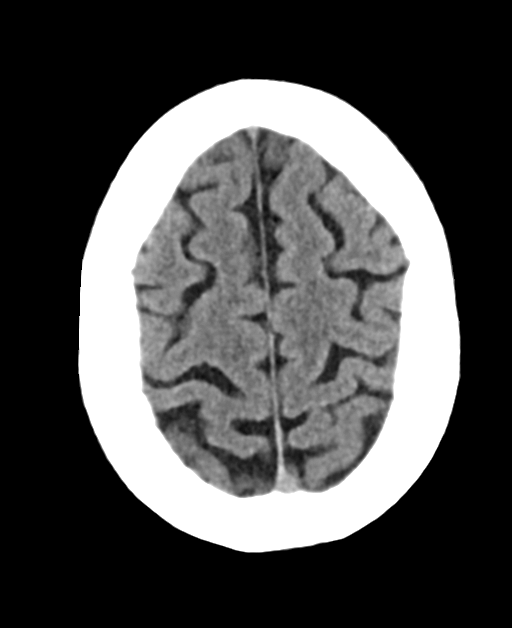
[im 28/32  brain]
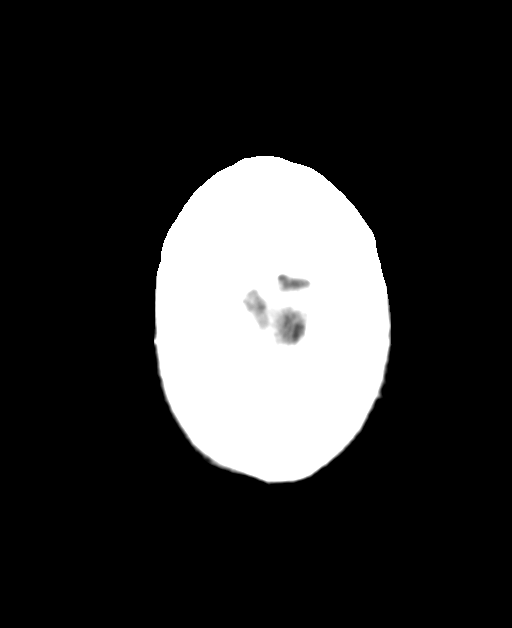

[Series 6: cor soft · coronal · 0.31mm/px · 3 of 70 slices shown]
[im 24/70  brain]
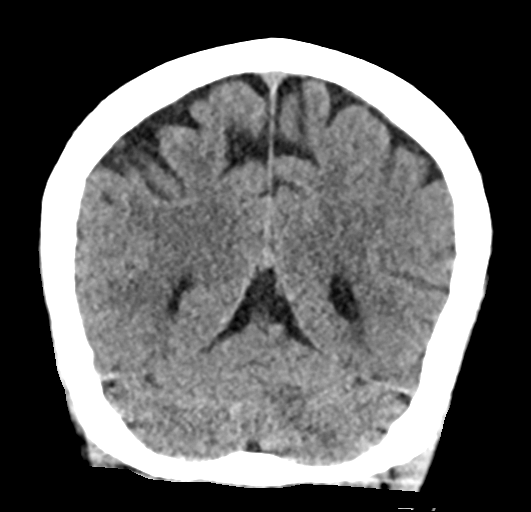
[im 31/70  brain]
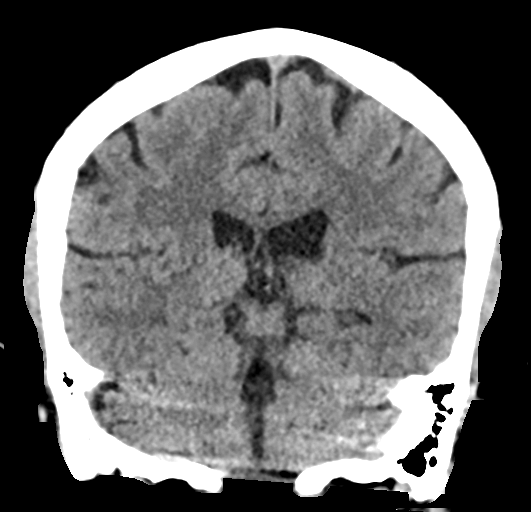
[im 39/70  brain]
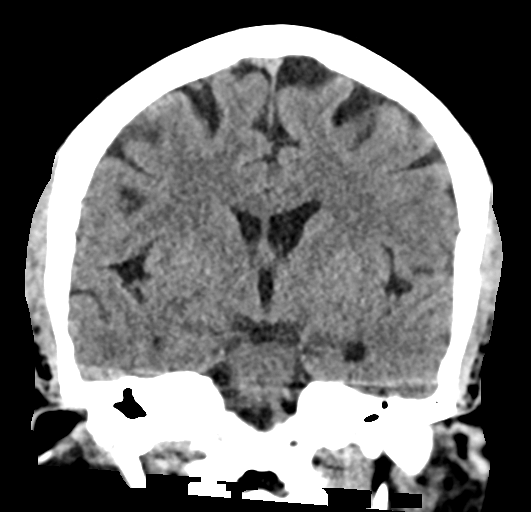

[Series 7: sag soft · sagittal · 0.31mm/px · 3 of 59 slices shown]
[im 20/59  brain]
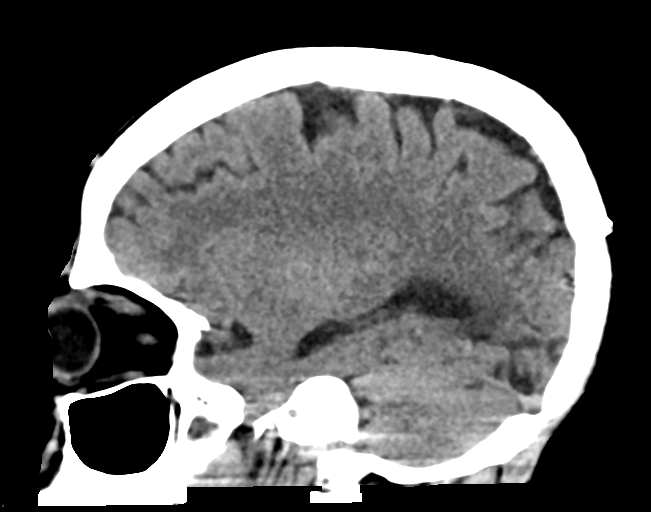
[im 30/59  brain]
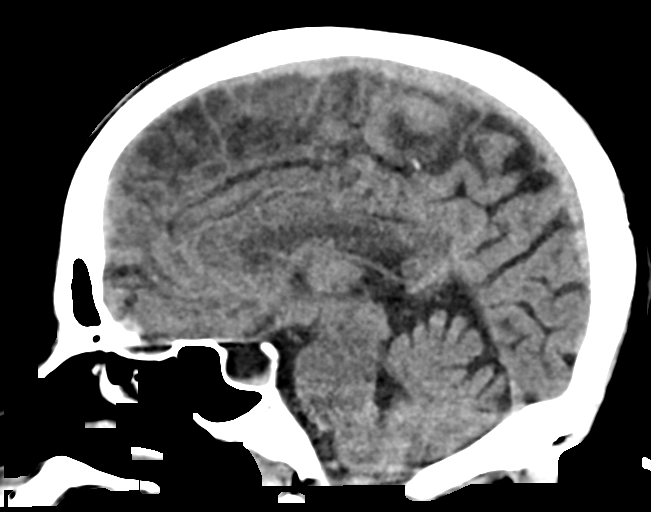
[im 39/59  brain]
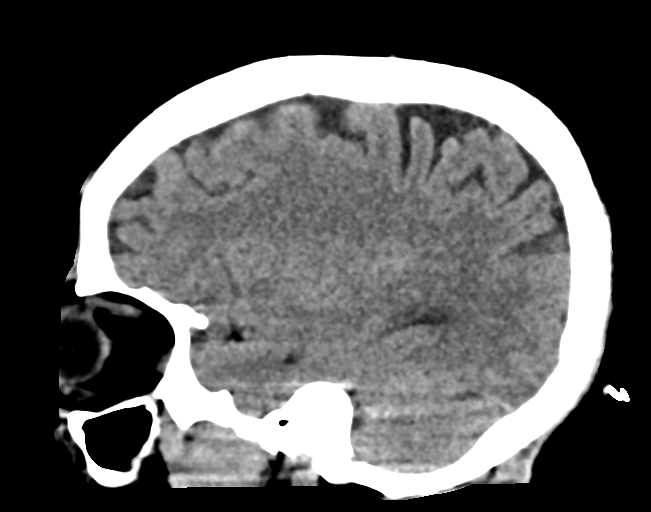

[13 of 47 positions shown; findings below may reference images not displayed]

FINDINGS: Brain: No evidence of acute large vascular territory infarction,
hemorrhage, hydrocephalus, extra-axial collection or mass
lesion/mass effect. Left occipital encephalomalacia with overlying
craniotomy. Partially empty sella.

Vascular: No hyperdense vessel identified

Skull: No acute fracture.  Prior left posterior craniotomy.

Sinuses/Orbits: Clear sinuses.  No acute orbital findings.

Other: No mastoid effusions.

ASPECTS (Alberta Stroke Program Early CT Score) Total score (0-10
with 10 being normal): 10.
IMPRESSION: 1. No evidence of acute intracranial abnormality. ASPECTS 10.
2. Left occipital encephalomalacia with overlying craniotomy.
3. Partially empty sella, which is often a normal anatomic variant
but can be associated with idiopathic intracranial hypertension
(pseudotumor cerebri).

Code stroke imaging results were communicated on [DATE] at [DATE]
to provider Dr. VALENTINO via secure text paging.

## 2021-05-10 MED ORDER — DIVALPROEX SODIUM 250 MG PO DR TAB: 250.0000 mg | DELAYED_RELEASE_TABLET | Freq: Three times a day (TID) | ORAL | Status: DC

## 2021-05-10 MED ORDER — LORAZEPAM 2 MG/ML IJ SOLN
INTRAMUSCULAR | Status: AC
  Administered 2021-05-10: 2 mg via INTRAVENOUS
  Filled 2021-05-10: qty 1

## 2021-05-10 MED ORDER — SODIUM CHLORIDE 0.9 % IV SOLN
200.0000 mg | INTRAVENOUS | Status: AC
  Administered 2021-05-10: 200 mg via INTRAVENOUS
  Filled 2021-05-10: qty 20

## 2021-05-10 MED ORDER — LORAZEPAM 2 MG/ML IJ SOLN
2.0000 mg | INTRAMUSCULAR | Status: AC
Start: 2021-05-10 — End: 2021-05-10

## 2021-05-10 MED ORDER — LEVETIRACETAM IN NACL 1000 MG/100ML IV SOLN
2000.0000 mg | Freq: Once | INTRAVENOUS | Status: AC
  Administered 2021-05-10: 2000 mg via INTRAVENOUS

## 2021-05-10 MED ORDER — SODIUM CHLORIDE 0.9 % IV SOLN
100.0000 mg | Freq: Two times a day (BID) | INTRAVENOUS | Status: DC
  Administered 2021-05-11: 100 mg via INTRAVENOUS
  Filled 2021-05-10 (×2): qty 10

## 2021-05-10 MED ORDER — LABETALOL HCL 5 MG/ML IV SOLN
10.0000 mg | Freq: Once | INTRAVENOUS | Status: AC
  Administered 2021-05-10: 10 mg via INTRAVENOUS
  Filled 2021-05-10: qty 4

## 2021-05-10 MED ORDER — LEVETIRACETAM IN NACL 500 MG/100ML IV SOLN
500.0000 mg | Freq: Two times a day (BID) | INTRAVENOUS | Status: DC
Start: 2021-05-11 — End: 2021-05-11
  Administered 2021-05-11: 500 mg via INTRAVENOUS
  Filled 2021-05-10 (×2): qty 100

## 2021-05-10 MED ORDER — ENOXAPARIN SODIUM 40 MG/0.4ML IJ SOSY: 40.0000 mg | PREFILLED_SYRINGE | INTRAMUSCULAR | Status: DC

## 2021-05-10 MED ORDER — SODIUM CHLORIDE 0.9% FLUSH
3.0000 mL | Freq: Once | INTRAVENOUS | Status: AC
  Administered 2021-05-10: 3 mL via INTRAVENOUS

## 2021-05-10 MED ORDER — LORAZEPAM 2 MG/ML IJ SOLN: 1.0000 mg | INTRAMUSCULAR | Status: DC | PRN

## 2021-05-10 MED ORDER — ACETAMINOPHEN 650 MG RE SUPP: 650.0000 mg | Freq: Four times a day (QID) | RECTAL | Status: DC | PRN

## 2021-05-10 MED ORDER — ACETAMINOPHEN 325 MG PO TABS: 650.0000 mg | ORAL_TABLET | Freq: Four times a day (QID) | ORAL | Status: DC | PRN

## 2021-05-10 MED ORDER — LORAZEPAM 2 MG/ML IJ SOLN
1.0000 mg | INTRAMUSCULAR | Status: AC
  Administered 2021-05-10: 1 mg via INTRAVENOUS

## 2021-05-10 MED ORDER — LEVETIRACETAM IN NACL 500 MG/100ML IV SOLN
500.0000 mg | Freq: Once | INTRAVENOUS | Status: DC
Start: 2021-05-10 — End: 2021-05-10

## 2021-05-10 MED ORDER — LEVETIRACETAM IN NACL 1500 MG/100ML IV SOLN
1500.0000 mg | Freq: Once | INTRAVENOUS | Status: AC
  Administered 2021-05-10: 1500 mg via INTRAVENOUS
  Filled 2021-05-10: qty 100

## 2021-05-10 MED ORDER — LORAZEPAM 2 MG/ML IJ SOLN
2.0000 mg | Freq: Once | INTRAMUSCULAR | Status: AC
  Administered 2021-05-10: 2 mg via INTRAVENOUS
  Filled 2021-05-10: qty 1

## 2021-05-10 MED ORDER — VALPROATE SODIUM 100 MG/ML IV SOLN
1000.0000 mg | INTRAVENOUS | Status: AC
  Administered 2021-05-10: 1000 mg via INTRAVENOUS
  Filled 2021-05-10: qty 10

## 2021-05-10 NOTE — Procedures (Signed)
History: 61 yo F with seizures and no return to baseline.  ? ?Sedation: Ativan prior to EEG  ? ?Technique: This EEG was acquired with electrodes placed according to the International 10-20 electrode system (including Fp1, Fp2, F3, F4, C3, C4, P3, P4, O1, O2, T3, T4, T5, T6, A1, A2, Fz, Cz, Pz). The following electrodes were missing or displaced: none. ? ? ?Background: The background is markedly asymmetric with a posterior dominant rhythm on the right of 9 to 10 Hz.  On the left, there is occipitally predominant sharply contoured fast activity superimposed on rhythmic delta.  There is evolution in field, frequency, and morphology of this activity consistent with partial status epilepticus. ? ?Photic stimulation: Physiologic driving is now performed ? 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 ?Clinical Interpretation: This EEG recorded evidence of focal status epilepticus arising from the left posterior quadrant.  ? ? ?Roland Rack, MD ?[facility] ?[phone] ? ?If 7pm- 7am, please page neurology on call as listed in [place]. ? ?

## 2021-05-10 NOTE — Progress Notes (Signed)
Following vimpat/depakote patient had cessation of status epilepticus.  ? ?She appears to be protecting her airway currently, but will need to be closely monitored.  ? ?1) Restart home keppra 500mg  BID ?2) Restart home depakote 250mg  TID(level in AM) ?3) continue vimpat 100mg  BID ? ?Roland Rack, MD ?[facility] ?[phone] ? ?If 7pm- 7am, please page neurology on call as listed in [place]. ? ?

## 2021-05-10 NOTE — ED Provider Notes (Signed)
?[facility] ?Provider Note ? ? ?CSN: [id number] ?Arrival date & time: 05/10/21  1741 ? ?An emergency department physician performed an initial assessment on this suspected stroke patient at 1742. ? ?History ? ?Chief Complaint  ?Patient presents with  ? Code Stroke  ? ? ?Jill Jefferson is a 61 y.o. female. ? ?61 yo F with a cc of L sided neglect, aphasia.  This started somewhat suddenly.  Arrived as a code stroke.  Patient is unable to provide any history.  Level 5 caveat. ? ? ? ?  ? ?Home Medications ?Prior to Admission medications   ?Medication Sig Start Date End Date Taking? Authorizing Provider  ?acetaminophen (TYLENOL) 500 MG tablet Take 1 tablet (500 mg total) by mouth 2 (two) times a day. ?Patient not taking: Reported on 09/03/2020 08/01/18   [name], FNP  ?amLODipine (NORVASC) 10 MG tablet take 1 tablet by mouth daily 03/16/21 03/17/22    ?amLODipine (NORVASC) 10 MG tablet 1 PO QD 03/27/21     ?amLODipine (NORVASC) 10 MG tablet take 1 tablet by mouth daily 03/27/21   Nolene Ebbs, MD  ?atorvastatin (LIPITOR) 40 MG tablet Take 1 tablet (40 mg total) by mouth daily. 09/07/20 10/07/20  Caren Griffins, MD  ?Blood Glucose Monitoring Suppl (TRUE METRIX METER) w/Device KIT use as directed 4 (four) times daily. 09/07/20   Caren Griffins, MD  ?divalproex (DEPAKOTE ER) 250 MG 24 hr tablet take 1 tablet by mouth three times a day 03/16/21 03/17/22    ?divalproex (DEPAKOTE) 250 MG DR tablet Take 1 tablet (250 mg total) by mouth every 8 (eight) hours. 09/07/20 10/07/20  Caren Griffins, MD  ?glucose blood (ACCU-CHEK GUIDE) test strip Use to test blood sugars up to 4 times a day 09/07/20   Caren Griffins, MD  ?insulin aspart (NOVOLOG) 100 UNIT/ML injection Inject 5 Units into the skin 3 (three) times daily with meals. 09/07/20   Caren Griffins, MD  ?insulin glargine (LANTUS) 100 UNIT/ML injection Inject 0.1 mLs (10 Units total) into the skin at bedtime. 09/07/20   Caren Griffins, MD  ?Insulin Syringe-Needle U-100 30G X 5/16" 0.3 ML MISC use one syringe as directed up to 4 times daily 09/07/20   Caren Griffins, MD  ?levETIRAcetam (KEPPRA) 500 MG tablet Take 1 tablet (500 mg total) by mouth 2 (two) times daily. 09/07/20 10/07/20  Caren Griffins, MD  ?   ? ?Allergies    ?Patient has no known allergies.   ? ?Review of Systems   ?Review of Systems ? ?Physical Exam ?Updated Vital Signs ?BP (!) 143/87   Pulse 70   Temp 98.4 ?F (36.9 ?C) (Oral)   Resp 14   Wt 62 kg   SpO2 100%   BMI 26.69 kg/m?  ?Physical Exam ?Vitals and nursing note reviewed.  ?Constitutional:   ?   General: She is not in acute distress. ?   Appearance: She is well-developed. She is not diaphoretic.  ?HENT:  ?   Head: Normocephalic and atraumatic.  ?Eyes:  ?   Pupils: Pupils are equal, round, and reactive to light.  ?Cardiovascular:  ?   Rate and Rhythm: Normal rate and regular rhythm.  ?   Heart sounds: No murmur heard. ?  No friction rub. No gallop.  ?Pulmonary:  ?   Effort: Pulmonary effort is normal.  ?   Breath sounds: No wheezing or rales.  ?Abdominal:  ?   General: There is  no distension.  ?   Palpations: Abdomen is soft.  ?   Tenderness: There is no abdominal tenderness.  ?Musculoskeletal:     ?   General: No tenderness.  ?   Cervical back: Normal range of motion and neck supple.  ?Skin: ?   General: Skin is warm and dry.  ?Neurological:  ?   Mental Status: She is alert and oriented to person, place, and time.  ?   Comments: Right gaze deviation right-sided fast-growing nystagmus.  Left-sided neglect but will move the left side somewhat spontaneously.  ?Psychiatric:     ?   Behavior: Behavior normal.  ? ? ?ED Results / Procedures / Treatments   ?Labs ?(all labs ordered are listed, but only abnormal results are displayed) ?Labs Reviewed  ?COMPREHENSIVE METABOLIC PANEL - Abnormal; Notable for the following components:  ?    Result Value  ? Glucose, Bld 306 (*)   ? BUN 33 (*)   ? Creatinine, Ser 2.86 (*)   ?  Albumin 3.4 (*)   ? GFR, Estimated 18 (*)   ? All other components within normal limits  ?I-STAT CHEM 8, ED - Abnormal; Notable for the following components:  ? Sodium 133 (*)   ? BUN 36 (*)   ? Creatinine, Ser 2.90 (*)   ? Glucose, Bld 316 (*)   ? Calcium, Ion 1.12 (*)   ? All other components within normal limits  ?CBG MONITORING, ED - Abnormal; Notable for the following components:  ? Glucose-Capillary 289 (*)   ? All other components within normal limits  ?I-STAT BETA HCG BLOOD, ED ([facility], [facility], [facility] ONLY) - Abnormal; Notable for the following components:  ? I-stat hCG, quantitative 6.5 (*)   ? All other components within normal limits  ?PROTIME-INR  ?APTT  ?CBC  ?DIFFERENTIAL  ?VALPROIC ACID LEVEL  ?MAGNESIUM  ?MAGNESIUM  ?COMPREHENSIVE METABOLIC PANEL  ?CBC WITH DIFFERENTIAL/PLATELET  ? ? ?EKG ?None ? ?Radiology ?EEG adult ? ?Result Date: 05/10/2021 ?Greta Doom, MD     05/10/2021  7:43 PM History: 61 yo F with seizures and no return to baseline. Sedation: Ativan prior to EEG Technique: This EEG was acquired with electrodes placed according to the International 10-20 electrode system (including Fp1, Fp2, F3, F4, C3, C4, P3, P4, O1, O2, T3, T4, T5, T6, A1, A2, Fz, Cz, Pz). The following electrodes were missing or displaced: none. Background: The background is markedly asymmetric with a posterior dominant rhythm on the right of 9 to 10 Hz.  On the left, there is occipitally predominant sharply contoured fast activity superimposed on rhythmic delta.  There is evolution in field, frequency, and morphology of this activity consistent with partial status epilepticus. Photic stimulation: Physiologic driving is now performed EEG Abnormalities: 1) focal status epilepticus with a left posterior quadrant predominance Clinical Interpretation: This EEG recorded evidence of focal status epilepticus arising from the left posterior quadrant. Roland Rack, MD [facility] [phone] If 7pm- 7am, please page  neurology on call as listed in [place].  ? ?CT HEAD CODE STROKE WO CONTRAST ? ?Result Date: 05/10/2021 ?CLINICAL DATA:  Code stroke.  Neuro deficit, acute, stroke suspected EXAM: CT HEAD WITHOUT CONTRAST TECHNIQUE: Contiguous axial images were obtained from the base of the skull through the vertex without intravenous contrast. RADIATION DOSE REDUCTION: This exam was performed according to the departmental dose-optimization program which includes automated exposure control, adjustment of the mA and/or kV according to patient size and/or use of iterative reconstruction technique. COMPARISON:  CT head 09/01/2020. FINDINGS: Brain: No evidence of acute large vascular territory infarction, hemorrhage, hydrocephalus, extra-axial collection or mass lesion/mass effect. Left occipital encephalomalacia with overlying craniotomy. Partially empty sella. Vascular: No hyperdense vessel identified Skull: No acute fracture.  Prior left posterior craniotomy. Sinuses/Orbits: Clear sinuses.  No acute orbital findings. Other: No mastoid effusions. ASPECTS [facility] Stroke Program Early CT Score) Total score (0-10 with 10 being normal): 10. IMPRESSION: 1. No evidence of acute intracranial abnormality. ASPECTS 10. 2. Left occipital encephalomalacia with overlying craniotomy. 3. Partially empty sella, which is often a normal anatomic variant but can be associated with idiopathic intracranial hypertension (pseudotumor cerebri). Code stroke imaging results were communicated on 05/10/2021 at 5:58 pm to provider Dr. Leonel Ramsay via secure text paging. Electronically Signed   By: Margaretha Sheffield M.D.   On: 05/10/2021 17:58   ? ?Procedures ?Procedures  ? ? ?Medications Ordered in ED ?Medications  ?divalproex (DEPAKOTE) DR tablet 250 mg (has no administration in time range)  ?levETIRAcetam (KEPPRA) IVPB 500 mg/100 mL premix (has no administration in time range)  ?lacosamide (VIMPAT) 100 mg in sodium chloride 0.9 % 25 mL IVPB (has no administration in  time range)  ?LORazepam (ATIVAN) injection 1 mg (has no administration in time range)  ?acetaminophen (TYLENOL) tablet 650 mg (has no administration in time range)  ?  Or  ?acetaminophen (TYLENOL) suppository

## 2021-05-10 NOTE — Code Documentation (Signed)
Stroke Response Nurse Documentation ?Code Documentation ? ?Jill Jefferson is a 61 y.o. female arriving to [facility]  via [facility] on 05/10/21 with past medical hx of CVA, seizures. On No antithrombotic. Code stroke was activated by EMS.  ? ?Patient from home where she was LKW at 1650 when her daughter reports a sudden change in mental status, right gauze preference, and weakness. Upon arrival patient has nystagmus, aphasia, and decreased LOC. Neurology and EDP at bedside during CT- 2mg  Ativan and 3,500 mg Keppra ordered and given for suspected seizure activity.  ? ?Stroke team at the bedside on patient arrival. Labs drawn and patient cleared for CT by EDP. Patient to CT with team. NIHSS 26, see documentation for details and code stroke times. Patient with decreased LOC, not following commands, right gaze preference , right hemianopia, bilateral arm weakness, bilateral leg weakness, Global aphasia , and Visual  neglect on exam. The following imaging was completed:  CT Head. Patient is not a candidate for IV Thrombolytic due to no suspected acute event. Patient is not not a candidate for IR due to no suspected LVO.Marland Kitchen  ? ?Care Plan: continue to monitor- q2 hour NIHSS and VS.  ? ?Bedside handoff with ED RN Jill Jefferson.   ? ?Meda Klinefelter  ?Stroke Response RN ? ? ?

## 2021-05-10 NOTE — Progress Notes (Addendum)
vLTM setup all impedances below 10kohms.  ED pt.  [facility] not monitoring   Patient event button tested. ?

## 2021-05-10 NOTE — ED Notes (Signed)
The pt arroused when she urinated otherwise she is alseep daughter at  the nedside ?

## 2021-05-10 NOTE — ED Notes (Signed)
Pt BIB [facility] from home as a code stroke. Per EMS, pt was sitting in front of computer, then all of sudden started experiencing R facial droop, R gaze and expressive aphasia. Hx of stroke.  ?

## 2021-05-10 NOTE — Consult Note (Signed)
Neurology Consultation ?Reason for Consult: Seizures ?Referring Physician: Tyrone Nine, D ? ?CC: Seizures ? ?History is obtained from: Daughter ? ?HPI: Jill Jefferson is a 61 y.o. female with a history of previous stroke as well as a history of nonconvulsive seizures presenting as aphasia.  She was in her normal state of health until around 5 PM, at which point she stopped responding.  She was brought in emergently as a code stroke, hand on arrival it was noted that she had increased tone on the right side with right-sided gaze with nystagmus.  She was given 2 mg of IV Ativan and taken for emergent head CT which was negative.  She continued to have increased tone on the right side and gaze deviation with nystagmus and so she was given another milligram of Ativan and given 3.5 g of IV Keppra. ? ?I asked her daughter if there is any chance that she was noncompliant with medications, and her daughter states that she does not like taking any medications and there is a chance that she could be noncompliant. ? ?LKW: 5 PM ?tpa given?: no, not a stroke ? ? ?ROS: Unable to obtain due to altered mental status.  ? ?Past Medical History:  ?Diagnosis Date  ? Diabetes ([place])   ? Edema   ? H. pylori infection 06/2019  ? H. pylori infection 06/2019  ? Hypertension   ? Proteinuria 11/2018  ? ? ? ?Family History  ?Problem Relation Age of Onset  ? Diabetes Mother   ? Hypertension Mother   ? ? ? ?Social History:  reports that she has never smoked. She has never used smokeless tobacco. She reports that she does not drink alcohol and does not use drugs. ? ? ?Exam: ?Current vital signs: ?BP (!) 190/102   Pulse 82   Temp 98.4 ?F (36.9 ?C) (Oral)   Resp 19   Wt 62 kg   SpO2 100%   BMI 26.69 kg/m?  ?Vital signs in last 24 hours: ?Temp:  [98.4 ?F (36.9 ?C)] 98.4 ?F (36.9 ?C) (05/03 1820) ?Pulse Rate:  [82-90] 82 (05/03 1815) ?Resp:  [14-19] 19 (05/03 1815) ?BP: (188-203)/(93-107) 190/102 (05/03 1815) ?SpO2:  [100 %] 100 % (05/03  1815) ?Weight:  [62 kg] 62 kg (05/03 1747) ? ? ?Physical Exam  ?Constitutional: Appears well-developed and well-nourished.  ?Psych: Affect appropriate to situation ?Eyes: No scleral injection ?HENT: No OP obstruction ?MSK: no joint deformities.  ?Cardiovascular: Normal rate and regular rhythm.  ?Respiratory: Effort normal, non-labored breathing ?GI: Soft.  No distension. There is no tenderness.  ?Skin: WDI ? ?Neuro: ?Mental Status: ?Patient does not follow commands or speak ?Cranial Nerves: ?II: She does not reliably blink to threat from either direction. Pupils are equal, round, and reactive to light.   ?III,IV, VI: Right gaze with nystagmus  ?VII: Facial movement is notable for increased tone of the right face ?Motor: ?She has mildly increased tone of the right arm and leg, has automatisms of the left arm  ?sensory: ?She does grimace to noxious stimulation in all four extremities  ?Cerebellar: ?Does not perform ? ? ?I have reviewed labs in epic and the results pertinent to this consultation are: ?Creatinine 2.9 ? ?I have reviewed the images obtained: CT head-previous stroke, nothing acute ? ?Impression: 61 year old female with a history of stroke as well as seizures presenting with focal status epilepticus.  She had cessation of clinical activity following Ativan/Keppra but given lack of return to baseline a stat EEG was obtained which demonstrates ongoing  left hemispheric seizure activity. ? ?Recommendations: ?1) Vimpat 200 mg x 1 ?2) repeat Ativan 2 mg x 1 ?3) I suspect noncompliance with Depakote and therefore will load with 1 g IV, further dosing dependent on level. ?4) Depakote level ?5) continuous EEG ?6) further titration of medications per EEG. ? ?This patient is critically ill and at significant risk of neurological worsening, death and care requires constant monitoring of vital signs, hemodynamics,respiratory and cardiac monitoring, neurological assessment, discussion with family, other specialists and  medical decision making of high complexity. I spent 55 minutes of neurocritical care time  in the care of  this patient. This was time spent independent of any time provided by nurse practitioner or PA. ? ?Roland Rack, MD ?[facility] ?[phone] ? ?If 7pm- 7am, please page neurology on call as listed in [place]. ?05/10/2021  7:24 PM ? ? ?

## 2021-05-10 NOTE — Progress Notes (Signed)
LTM EEG reviewed at 11:25 PM. No current electrographic seizure activity. Low voltage background with beta activity is noted bilaterally.  ? ?Electronically signed: Dr. Kerney Elbe ? ?

## 2021-05-10 NOTE — Progress Notes (Signed)
EEG complete - results pending 

## 2021-05-11 ENCOUNTER — Other Ambulatory Visit: Payer: Self-pay

## 2021-05-11 ENCOUNTER — Encounter (HOSPITAL_COMMUNITY): Payer: Self-pay | Admitting: Internal Medicine

## 2021-05-11 DIAGNOSIS — G9341 Metabolic encephalopathy: Secondary | ICD-10-CM

## 2021-05-11 DIAGNOSIS — Z79899 Other long term (current) drug therapy: Secondary | ICD-10-CM | POA: Diagnosis not present

## 2021-05-11 DIAGNOSIS — I160 Hypertensive urgency: Secondary | ICD-10-CM | POA: Diagnosis present

## 2021-05-11 DIAGNOSIS — E8809 Other disorders of plasma-protein metabolism, not elsewhere classified: Secondary | ICD-10-CM | POA: Diagnosis present

## 2021-05-11 DIAGNOSIS — G40901 Epilepsy, unspecified, not intractable, with status epilepticus: Secondary | ICD-10-CM | POA: Diagnosis not present

## 2021-05-11 DIAGNOSIS — Z794 Long term (current) use of insulin: Secondary | ICD-10-CM | POA: Diagnosis not present

## 2021-05-11 DIAGNOSIS — G40101 Localization-related (focal) (partial) symptomatic epilepsy and epileptic syndromes with simple partial seizures, not intractable, with status epilepticus: Secondary | ICD-10-CM | POA: Diagnosis present

## 2021-05-11 DIAGNOSIS — Z8249 Family history of ischemic heart disease and other diseases of the circulatory system: Secondary | ICD-10-CM | POA: Diagnosis not present

## 2021-05-11 DIAGNOSIS — E1165 Type 2 diabetes mellitus with hyperglycemia: Secondary | ICD-10-CM | POA: Diagnosis present

## 2021-05-11 DIAGNOSIS — I10 Essential (primary) hypertension: Secondary | ICD-10-CM | POA: Diagnosis not present

## 2021-05-11 DIAGNOSIS — G40909 Epilepsy, unspecified, not intractable, without status epilepticus: Secondary | ICD-10-CM

## 2021-05-11 DIAGNOSIS — E1122 Type 2 diabetes mellitus with diabetic chronic kidney disease: Secondary | ICD-10-CM | POA: Diagnosis present

## 2021-05-11 DIAGNOSIS — N184 Chronic kidney disease, stage 4 (severe): Secondary | ICD-10-CM | POA: Diagnosis present

## 2021-05-11 DIAGNOSIS — Z8673 Personal history of transient ischemic attack (TIA), and cerebral infarction without residual deficits: Secondary | ICD-10-CM | POA: Diagnosis not present

## 2021-05-11 DIAGNOSIS — Z833 Family history of diabetes mellitus: Secondary | ICD-10-CM | POA: Diagnosis not present

## 2021-05-11 DIAGNOSIS — I129 Hypertensive chronic kidney disease with stage 1 through stage 4 chronic kidney disease, or unspecified chronic kidney disease: Secondary | ICD-10-CM | POA: Diagnosis present

## 2021-05-11 DIAGNOSIS — E778 Other disorders of glycoprotein metabolism: Secondary | ICD-10-CM | POA: Diagnosis present

## 2021-05-11 LAB — CBC WITH DIFFERENTIAL/PLATELET
Abs Immature Granulocytes: 0.02 10*3/uL (ref 0.00–0.07)
Basophils Absolute: 0 10*3/uL (ref 0.0–0.1)
Basophils Relative: 1 %
Eosinophils Absolute: 0.1 10*3/uL (ref 0.0–0.5)
Eosinophils Relative: 1 %
HCT: 39.2 % (ref 36.0–46.0)
Hemoglobin: 12.4 g/dL (ref 12.0–15.0)
Immature Granulocytes: 0 %
Lymphocytes Relative: 31 %
Lymphs Abs: 2 10*3/uL (ref 0.7–4.0)
MCH: 26.3 pg (ref 26.0–34.0)
MCHC: 31.6 g/dL (ref 30.0–36.0)
MCV: 83.1 fL (ref 80.0–100.0)
Monocytes Absolute: 0.4 10*3/uL (ref 0.1–1.0)
Monocytes Relative: 6 %
Neutro Abs: 3.9 10*3/uL (ref 1.7–7.7)
Neutrophils Relative %: 61 %
Platelets: 233 10*3/uL (ref 150–400)
RBC: 4.72 MIL/uL (ref 3.87–5.11)
RDW: 12.6 % (ref 11.5–15.5)
WBC: 6.4 10*3/uL (ref 4.0–10.5)
nRBC: 0 % (ref 0.0–0.2)

## 2021-05-11 LAB — COMPREHENSIVE METABOLIC PANEL
ALT: 9 U/L (ref 0–44)
AST: 15 U/L (ref 15–41)
Albumin: 3 g/dL — ABNORMAL LOW (ref 3.5–5.0)
Alkaline Phosphatase: 67 U/L (ref 38–126)
Anion gap: 11 (ref 5–15)
BUN: 28 mg/dL — ABNORMAL HIGH (ref 6–20)
CO2: 23 mmol/L (ref 22–32)
Calcium: 9.1 mg/dL (ref 8.9–10.3)
Chloride: 104 mmol/L (ref 98–111)
Creatinine, Ser: 2.67 mg/dL — ABNORMAL HIGH (ref 0.44–1.00)
GFR, Estimated: 20 mL/min — ABNORMAL LOW (ref >60)
Glucose, Bld: 203 mg/dL — ABNORMAL HIGH (ref 70–99)
Potassium: 4.4 mmol/L (ref 3.5–5.1)
Sodium: 138 mmol/L (ref 135–145)
Total Bilirubin: 0.7 mg/dL (ref 0.3–1.2)
Total Protein: 6 g/dL — ABNORMAL LOW (ref 6.5–8.1)

## 2021-05-11 LAB — CBG MONITORING, ED
Glucose-Capillary: 225 mg/dL — ABNORMAL HIGH (ref 70–99)
Glucose-Capillary: 207 mg/dL — ABNORMAL HIGH (ref 70–99)
Glucose-Capillary: 123 mg/dL — ABNORMAL HIGH (ref 70–99)

## 2021-05-11 LAB — URINALYSIS, COMPLETE (UACMP) WITH MICROSCOPIC
Bilirubin Urine: NEGATIVE
Glucose, UA: >=500 mg/dL — AB
Ketones, ur: NEGATIVE mg/dL
Leukocytes,Ua: NEGATIVE
Nitrite: NEGATIVE
Protein, ur: 100 mg/dL — AB
Specific Gravity, Urine: 1.009 (ref 1.005–1.030)
pH: 7 (ref 5.0–8.0)

## 2021-05-11 LAB — RAPID URINE DRUG SCREEN, HOSP PERFORMED
Amphetamines: NOT DETECTED
Barbiturates: NOT DETECTED
Benzodiazepines: NOT DETECTED
Cocaine: NOT DETECTED
Opiates: NOT DETECTED
Tetrahydrocannabinol: NOT DETECTED

## 2021-05-11 LAB — MAGNESIUM: Magnesium: 2 mg/dL (ref 1.7–2.4)

## 2021-05-11 LAB — HEMOGLOBIN A1C
Hgb A1c MFr Bld: 11.4 % — ABNORMAL HIGH (ref 4.8–5.6)
Mean Plasma Glucose: 280.48 mg/dL

## 2021-05-11 LAB — GLUCOSE, CAPILLARY
Glucose-Capillary: 72 mg/dL (ref 70–99)
Glucose-Capillary: 273 mg/dL — ABNORMAL HIGH (ref 70–99)

## 2021-05-11 LAB — SODIUM, URINE, RANDOM: Sodium, Ur: 75 mmol/L

## 2021-05-11 LAB — CREATININE, URINE, RANDOM: Creatinine, Urine: 38.51 mg/dL

## 2021-05-11 MED ORDER — LEVETIRACETAM 500 MG PO TABS
500.0000 mg | ORAL_TABLET | Freq: Two times a day (BID) | ORAL | Status: DC
  Administered 2021-05-11 – 2021-05-13 (×4): 500 mg via ORAL
  Filled 2021-05-11 (×4): qty 1

## 2021-05-11 MED ORDER — LEVETIRACETAM IN NACL 500 MG/100ML IV SOLN
500.0000 mg | Freq: Two times a day (BID) | INTRAVENOUS | Status: DC
  Filled 2021-05-11 (×3): qty 100

## 2021-05-11 MED ORDER — INSULIN ASPART 100 UNIT/ML IJ SOLN
0.0000 [IU] | Freq: Four times a day (QID) | INTRAMUSCULAR | Status: DC
  Administered 2021-05-11: 3 [IU] via SUBCUTANEOUS
  Administered 2021-05-11: 1 [IU] via SUBCUTANEOUS
  Administered 2021-05-11: 5 [IU] via SUBCUTANEOUS
  Administered 2021-05-12: 9 [IU] via SUBCUTANEOUS
  Administered 2021-05-12: 1 [IU] via SUBCUTANEOUS
  Administered 2021-05-13: 2 [IU] via SUBCUTANEOUS
  Administered 2021-05-13: 3 [IU] via SUBCUTANEOUS

## 2021-05-11 MED ORDER — SODIUM CHLORIDE 0.9 % IV SOLN
100.0000 mg | Freq: Two times a day (BID) | INTRAVENOUS | Status: DC
  Filled 2021-05-11: qty 10

## 2021-05-11 MED ORDER — VALPROATE SODIUM 100 MG/ML IV SOLN
250.0000 mg | Freq: Three times a day (TID) | INTRAVENOUS | Status: DC
  Filled 2021-05-11 (×5): qty 2.5

## 2021-05-11 MED ORDER — LACOSAMIDE 50 MG PO TABS: 100.0000 mg | ORAL_TABLET | Freq: Two times a day (BID) | ORAL | Status: DC

## 2021-05-11 MED ORDER — DIVALPROEX SODIUM 250 MG PO DR TAB
250.0000 mg | DELAYED_RELEASE_TABLET | Freq: Three times a day (TID) | ORAL | Status: DC
Start: 2021-05-11 — End: 2021-05-12
  Administered 2021-05-11 – 2021-05-12 (×3): 250 mg via ORAL
  Filled 2021-05-11 (×3): qty 1

## 2021-05-11 MED ORDER — AMLODIPINE BESYLATE 10 MG PO TABS
10.0000 mg | ORAL_TABLET | Freq: Every day | ORAL | Status: DC
  Administered 2021-05-11 – 2021-05-13 (×3): 10 mg via ORAL
  Filled 2021-05-11: qty 2
  Filled 2021-05-11 (×2): qty 1

## 2021-05-11 MED ORDER — INSULIN GLARGINE-YFGN 100 UNIT/ML ~~LOC~~ SOLN
8.0000 [IU] | Freq: Every day | SUBCUTANEOUS | Status: DC
  Administered 2021-05-11 – 2021-05-13 (×2): 8 [IU] via SUBCUTANEOUS
  Filled 2021-05-11 (×3): qty 0.08

## 2021-05-11 MED ORDER — LABETALOL HCL 5 MG/ML IV SOLN
10.0000 mg | INTRAVENOUS | Status: DC | PRN
  Administered 2021-05-11: 10 mg via INTRAVENOUS
  Filled 2021-05-11: qty 4

## 2021-05-11 NOTE — Assessment & Plan Note (Signed)
See above

## 2021-05-11 NOTE — Assessment & Plan Note (Signed)
? ?#)   Acute encephalopathy: Somnolence and diminished responsiveness following of seizure activity on EEG consistent with postictal state, with suspected independent pharmacologic contribution from multiple doses of antiepileptic medications, as further detailed above.  No residual seizure activity on EEG.  No overt additional source identified at this time.  Does not appear septic and no e/o  underlying infectious process at this time. No overt additional metabolic contribution, including no significant electrolyte abnormalities.  ? ?  ? ?Plan: Work-up and management of presenting seizures, as above. Repeat CMP/CBC in AM. TSH. MMA. Fall precautions.  serum ethanol level.  UA/UDS ? ? ?

## 2021-05-11 NOTE — H&P (Signed)
?History and Physical  ? ? ?PLEASE NOTE THAT DRAGON DICTATION SOFTWARE WAS USED IN THE CONSTRUCTION OF THIS NOTE. ? ? ?Jill Jefferson DOB: Oct 30, 1960 DOA: 05/10/2021 ? ?PCP: Pcp, No  ?Patient coming from: home  ? ?I have personally briefly reviewed patient's old medical records in [place] ? ?Chief Complaint: Altered mental status ? ?HPI: Jill Jefferson is a 61 y.o. female with medical history significant for seizure disorder, type 2 diabetes mellitus, hypertension stage IV chronic kidney disease with baseline creatinine of 2.1-2.8, who is admitted to [facility] on 05/10/2021 with breakthrough seizures after presenting from home to [facility] ED for evaluation of altered mental status.  ? ?In the setting of the patient's current altered mental status, the following history is obtained via my discussions with the EDP as well as via chart review. ? ?Patient reportedly exhibited evidence of confusion, diminished responsiveness, starting earlier today and associated with reported right gaze deviation and left hemineglect prompting her to present via EMS as a code stroke.  No reported associated generalized tonic-clonic activity. ? ?She was evaluated by Dr. Leonel Ramsay of Neurology in the ED, with ensuing findings of breakthrough seizures per evaluation with EEG.  Dr. Leonel Ramsay Reportedly felt that presentation was a suggestive of acute stroke, without associated recommendation for MRI brain.  Ultimately, EEG reportedly revealed termination of seizure activity following multiple doses of antiepileptics, as further outlined below.  Dr. Leonel Ramsay To formally consult, and recommends admission to the hospital service for further evaluation management of presenting breakthrough seizures.  ? ?This is in the context of a documented history of seizures for which the patient is portably on a regimen that consist of Depakote and Keppra.  Unclear compliance with these medications.  No report of recent  preceding trauma. ? ?Medical history also notable for stage IV chronic kidney disease associated baseline creatinine range of 2.1-2.8. ? ? ? ?ED Course:  ?Vital signs in the ED were notable for the following: Afebrile; heart rate 71-89; blood pressure 160/92 -194/96; respiratory rate 15-19, oxygen saturation 100% on room air. ? ?Labs were notable for the following: CMP notable for the following: Sodium 135, bicarbonate 25, creatinine 2.86, glucose 306, calcium, corrected for mild hypoalbuminemia noted to be 9.5, albumin 3.4; otherwise, liver enzymes found to be within normal limits.  CBC notable for white blood cell count 7100.  INR 0.9. ? ?Imaging and additional notable ED work-up: EKG showed sinus rhythm with heart rate 81, normal intervals, no evidence of T wave or ST changes, including no evidence of ST elevation.  Noncontrast CT that showed no evidence of acute intracranial process, including no evidence of intracranial hemorrhage or acute infarct.  ? ?Dr. Leonel Ramsay neurology formally consulted, as further detailed above. ? ?While in the ED, the following were administered: Labetalol 10 mg IV x1, IV Ativan times a total of 5 mg; Vimpat 200 mg IV x1, Keppra 3500 mg IV x1, valproic acid 1 g IV x1. ? ?Subsequently, the patient was admitted for further evaluation and management of breakthrough seizures. ? ? ? ?Review of Systems: As per HPI otherwise 10 point review of systems negative.  ? ?Past Medical History:  ?Diagnosis Date  ? Diabetes ([place])   ? Edema   ? H. pylori infection 06/2019  ? H. pylori infection 06/2019  ? Hypertension   ? Proteinuria 11/2018  ? Seizures ([place])   ? ? ?History reviewed. No pertinent surgical history. ? ?Social History: ? reports that she has never smoked. She has  never used smokeless tobacco. She reports that she does not drink alcohol and does not use drugs. ? ? ?No Known Allergies ? ?Family History  ?Problem Relation Age of Onset  ? Diabetes Mother   ? Hypertension Mother    ? ? ?Family history reviewed and not pertinent  ? ? ?Prior to Admission medications   ?Medication Sig Start Date End Date Taking? Authorizing Provider  ?acetaminophen (TYLENOL) 500 MG tablet Take 1 tablet (500 mg total) by mouth 2 (two) times a day. ?Patient not taking: Reported on 09/03/2020 08/01/18   [name], FNP  ?amLODipine (NORVASC) 10 MG tablet take 1 tablet by mouth daily 03/16/21 03/17/22    ?amLODipine (NORVASC) 10 MG tablet 1 PO QD 03/27/21     ?amLODipine (NORVASC) 10 MG tablet take 1 tablet by mouth daily 03/27/21   Nolene Ebbs, MD  ?atorvastatin (LIPITOR) 40 MG tablet Take 1 tablet (40 mg total) by mouth daily. 09/07/20 10/07/20  Caren Griffins, MD  ?Blood Glucose Monitoring Suppl (TRUE METRIX METER) w/Device KIT use as directed 4 (four) times daily. 09/07/20   Caren Griffins, MD  ?divalproex (DEPAKOTE ER) 250 MG 24 hr tablet take 1 tablet by mouth three times a day 03/16/21 03/17/22    ?divalproex (DEPAKOTE) 250 MG DR tablet Take 1 tablet (250 mg total) by mouth every 8 (eight) hours. 09/07/20 10/07/20  Caren Griffins, MD  ?glucose blood (ACCU-CHEK GUIDE) test strip Use to test blood sugars up to 4 times a day 09/07/20   Caren Griffins, MD  ?insulin aspart (NOVOLOG) 100 UNIT/ML injection Inject 5 Units into the skin 3 (three) times daily with meals. 09/07/20   Caren Griffins, MD  ?insulin glargine (LANTUS) 100 UNIT/ML injection Inject 0.1 mLs (10 Units total) into the skin at bedtime. 09/07/20   Caren Griffins, MD  ?Insulin Syringe-Needle U-100 30G X 5/16" 0.3 ML MISC use one syringe as directed up to 4 times daily 09/07/20   Caren Griffins, MD  ?levETIRAcetam (KEPPRA) 500 MG tablet Take 1 tablet (500 mg total) by mouth 2 (two) times daily. 09/07/20 10/07/20  Caren Griffins, MD  ? ? ? ?Objective  ? ? ?Physical Exam: ?Vitals:  ? 05/11/21 0130 05/11/21 0200 05/11/21 0240 05/11/21 0500  ?BP: (!) 185/101 (!) 208/149  (!) 197/99  ?Pulse: 69 81  69  ?Resp: '15  16 15  ' ?Temp:      ?TempSrc:       ?SpO2: 100% 100%  100%  ?Weight:      ? ? ?General: appears to be stated age; somnolent; not following instructions;  ?Skin: warm, dry, no rash ?Head:  AT/[place] ?Mouth:  Oral mucosa membranes appear moist, normal dentition ?Neck: supple; trachea midline ?Heart:  RRR; did not appreciate any M/R/G ?Lungs: CTAB, did not appreciate any wheezes, rales, or rhonchi ?Abdomen: + BS; soft, ND,  ?Vascular: 2+ pedal pulses b/l; 2+ radial pulses b/l ?Extremities: no peripheral edema, no muscle wasting ?Neuro: In the setting of the patient's current mental status and associated inability to follow instructions, unable to perform full neurologic exam at this time.  As such, assessment of strength, sensation, and cranial nerves is limited at this time. Patient noted to spontaneously move all 4 extremities. No tremors.  ? ? ? ? ?Labs on Admission: I have personally reviewed following labs and imaging studies ? ?CBC: ?Recent Labs  ?Lab 05/10/21 ?1747 05/10/21 ?1752 05/11/21 ?0430  ?WBC 7.1  --  6.4  ?NEUTROABS  3.5  --  3.9  ?HGB 12.7 13.6 12.4  ?HCT 39.6 40.0 39.2  ?MCV 83.0  --  83.1  ?PLT 232  --  233  ? ?Basic Metabolic Panel: ?Recent Labs  ?Lab 05/10/21 ?1747 05/10/21 ?1752 05/10/21 ?2133  ?NA 135 133*  --   ?K 4.6 4.5  --   ?CL 100 102  --   ?CO2 25  --   --   ?GLUCOSE 306* 316*  --   ?BUN 33* 36*  --   ?CREATININE 2.86* 2.90*  --   ?CALCIUM 9.1  --   --   ?MG  --   --  1.9  ? ?GFR: ?CrCl cannot be calculated (Unknown ideal weight.). ?Liver Function Tests: ?Recent Labs  ?Lab 05/10/21 ?1747  ?AST 16  ?ALT 10  ?ALKPHOS 82  ?BILITOT 0.6  ?PROT 6.5  ?ALBUMIN 3.4*  ? ?No results for input(s): LIPASE, AMYLASE in the last 168 hours. ?No results for input(s): AMMONIA in the last 168 hours. ?Coagulation Profile: ?Recent Labs  ?Lab 05/10/21 ?1747  ?INR 0.9  ? ?Cardiac Enzymes: ?No results for input(s): CKTOTAL, CKMB, CKMBINDEX, TROPONINI in the last 168 hours. ?BNP (last 3 results) ?No results for input(s): PROBNP in the last 8760  hours. ?HbA1C: ?No results for input(s): HGBA1C in the last 72 hours. ?CBG: ?Recent Labs  ?Lab 05/10/21 ?4128 05/11/21 ?0148  ?GLUCAP 289* 225*  ? ?Lipid Profile: ?No results for input(s): CHOL, HDL, LDLCALC, TRIG, CHOLHDL,

## 2021-05-11 NOTE — ED Notes (Signed)
The pts pure wick has been pulled part of the way out  minimal amount of stool  in panties  the urine has been running partially into the suction cannister but the bed and  her sheets are wet.  Pt cleaned linen changed pts speech is a little clearer.  Daughter remains at  the bedside  ?

## 2021-05-11 NOTE — Hospital Course (Signed)
Jill Jefferson is a 61 y.o. F with seizures (typically non-convulsive, presenting as aphasia), chronic kidney disease and DM who presented with decreased responsiveness. ? ?In USOH until day of admission, developed aphasia, increased right sided tone, abnormal eye movements. ? ?In the ER, given Ativan and underwent CTH which was normal, then loaded with Keppra.  EEG showed focal status epilepticus, and so she was loaded with Vimpat and Depakote as well, and admitted. ?

## 2021-05-11 NOTE — Assessment & Plan Note (Addendum)
Baseline creatinine 2.1-2.8.  Current creatinine function is within this range ? ?Here, Cr up to 2.9 today in setting of severe hypertension, dizziness. ?- Renally dose medications ?- Avoid nephrotoxins ?- IV fluids overnight ? ?

## 2021-05-11 NOTE — Progress Notes (Signed)
SLP Cancellation Note ? ?Patient Details ?Name: Jill Jefferson ?MRN: [id number] ?DOB: [birth year] ? ? ?Cancelled treatment:       Reason Eval/Treat Not Completed: SLP screened, no needs identified, will sign off  Pt passed [facility] swallow screen subsequent to swallow evaluation order being placed; therefore, no formalized SLP swallow eval is needed per protocol. Will sign off.   ? ? ?Maddalynn Barnard I. Hardin Negus, [place], CCC-SLP ?Acute Rehabilitation Services ?Office number [phone] ?Pager [phone] ? ?Jill Jefferson ?05/11/2021, 9:37 AM ? ? ?

## 2021-05-11 NOTE — Progress Notes (Signed)
EEG D/C'd. No skin breakdown noted.  ?

## 2021-05-11 NOTE — ED Notes (Signed)
The pt is awake her speech is hardly understood  she does not answer questions asked  seizure pads placed on the stretcher rails.   ?

## 2021-05-11 NOTE — Progress Notes (Addendum)
Subjective: Seizure resolved last night. Patient denies any concerns this morning. States she doesn't take her seizure meds but isnt able to tell me why she doesnt take them  ? ?ROS: negative except above ? ?Examination ? ?Vital signs in last 24 hours: ?Temp:  [98.4 ?F (36.9 ?C)] 98.4 ?F (36.9 ?C) (05/03 1820) ?Pulse Rate:  [69-90] 75 (05/04 1200) ?Resp:  [14-20] 15 (05/04 1200) ?BP: (131-208)/(73-149) 164/88 (05/04 1200) ?SpO2:  [98 %-100 %] 100 % (05/04 1200) ?Weight:  [62 kg] 62 kg (05/03 1747) ? ?General: lying in bed, NAD ?Neuro: awake, alert, oriented to self, place and time, does perseverate, able to name objects and follow commands, right hemianopia vs neglect ( patient unable to cooperate), PERLA, EOMI with left gaze preference, no facial asymmetry, 5/5 in all extremities ? ?Basic Metabolic Panel: ?Recent Labs  ?Lab 05/10/21 ?1747 05/10/21 ?1752 05/10/21 ?2133 05/11/21 ?0430  ?NA 135 133*  --  138  ?K 4.6 4.5  --  4.4  ?CL 100 102  --  104  ?CO2 25  --   --  23  ?GLUCOSE 306* 316*  --  203*  ?BUN 33* 36*  --  28*  ?CREATININE 2.86* 2.90*  --  2.67*  ?CALCIUM 9.1  --   --  9.1  ?MG  --   --  1.9 2.0  ? ? ?CBC: ?Recent Labs  ?Lab 05/10/21 ?1747 05/10/21 ?1752 05/11/21 ?0430  ?WBC 7.1  --  6.4  ?NEUTROABS 3.5  --  3.9  ?HGB 12.7 13.6 12.4  ?HCT 39.6 40.0 39.2  ?MCV 83.0  --  83.1  ?PLT 232  --  233  ? ? ? ?Coagulation Studies: ?Recent Labs  ?  05/10/21 ?1747  ?LABPROT 12.5  ?INR 0.9  ? ? ?Imaging ?Clay wo contrast 05/10/2021: No evidence of acute intracranial abnormality. ASPECTS 10. Left occipital encephalomalacia with overlying craniotomy. ?Partially empty sella, which is often a normal anatomic variant but can be associated with idiopathic intracranial hypertension (pseudotumor cerebri). ?  ?ASSESSMENT AND PLAN: 61 year old female with a history of stroke as well as seizures presenting with focal status epilepticus due to medication non -compliance.  ? ?Focal convulsive status epilepticus, resolved ?Epilepsy  with breakthrough seizure ?Acute encephalopathy due to seizure, resolved ?Hypoproteinemia with hypoalbuminemia ?CKD ?- This study initially showed focal nonconvulsive status epilepticus arising from left posterior quadrant.  As medications were adjusted, status epilepticus resolved at 2026 on 05/10/2021.  EEG was then suggestive of moderate to severe diffuse encephalopathy, likely due to postictal state, medications.  EEG continue to improve and is now suggestive of cortical dysfunction in left posterior quadrant, likely due to underlying structural abnormality, postictal state.  No further seizures were seen during the study. ?  ?Recommendations ?- Contiue keppra 500mg  BID, VPA 250mg  Q8h.  ?- DC Vimpat as it was not home medication and seizure happened due to non-compliance so most likely patient wont need another AED ?- DC LTM as no further sz overnight ?- Discussed importance of medication compliance ?-Continue seizure precautions ?- PRN IV ativan for GTC>2 mins ?- Management of rest of comorbidities per pri team ?- F/U with neuro in 2-3 months ? ?Seizure precautions: ?Per [facility] statutes, patients with seizures are not allowed to drive until they have been seizure-free for six months and cleared by a physician  ?  ?Use caution when using heavy equipment or power tools. Avoid working on ladders or at heights. Take showers instead of baths. Ensure the water temperature  is not too high on the home water heater. Do not go swimming alone. Do not lock yourself in a room alone (i.e. bathroom). When caring for infants or small children, sit down when holding, feeding, or changing them to minimize risk of injury to the child in the event you have a seizure. Maintain good sleep hygiene. Avoid alcohol.  ?  ?If patient has another seizure, call 911 and bring them back to the ED if: ?A.  The seizure lasts longer than 5 minutes.      ?B.  The patient doesn't wake shortly after the seizure or has new problems such as  difficulty seeing, speaking or moving following the seizure ?C.  The patient was injured during the seizure ?D.  The patient has a temperature over 102 F (39C) ?E.  The patient vomited during the seizure and now is having trouble breathing ?   ?During the Seizure ?  ?- First, ensure adequate ventilation and place patients on the floor on their left side  ?Loosen clothing around the neck and ensure the airway is patent. If the patient is clenching the teeth, do not force the mouth open with any object as this can cause severe damage ?- Remove all items from the surrounding that can be hazardous. The patient may be oblivious to what's happening and may not even know what he or she is doing. ?If the patient is confused and wandering, either gently guide him/her away and block access to outside areas ?- Reassure the individual and be comforting ?- Call 911. In most cases, the seizure ends before EMS arrives. However, there are cases when seizures may last over 3 to 5 minutes. Or the individual may have developed breathing difficulties or severe injuries. If a pregnant patient or a person with diabetes develops a seizure, it is prudent to call an ambulance. ?- Finally, if the patient does not regain full consciousness, then call EMS. Most patients will remain confused for about 45 to 90 minutes after a seizure, so you must use judgment in calling for help. ? ? After the Seizure (Postictal Stage) ?  ?After a seizure, most patients experience confusion, fatigue, muscle pain and/or a headache. Thus, one should permit the individual to sleep. For the next few days, reassurance is essential. Being calm and helping reorient the person is also of importance. ?  ?Most seizures are painless and end spontaneously. Seizures are not harmful to others but can lead to complications such as stress on the lungs, brain and the heart. Individuals with prior lung problems may develop labored breathing and respiratory distress.  ? ?I have  spent a total of 38 minutes with the patient reviewing hospital notes,  test results, labs and examining the patient as well as establishing an assessment and plan that was discussed personally with the patient.  > 50% of time was spent in direct patient care. ?  ?Zeb Comfort ?Epilepsy ?[facility] ?For questions after 5pm please refer to AMION to reach the Neurologist on call ? ?

## 2021-05-11 NOTE — Assessment & Plan Note (Addendum)
Hypertensive urgency ?Blood pressure improved yesterday but elevated most of today.  Dizzy and with worsening renal function.   ?- Resume home amlodipine ?- Start Coreg ? ?

## 2021-05-11 NOTE — Assessment & Plan Note (Addendum)
Suspect medication nonadherence, although Depakote level normal. ?- Continue depakote, Keppra ?- Continue new Vimpat ?- Consult Neurology ? ?

## 2021-05-11 NOTE — ED Notes (Signed)
The pt did not pass her swallow screen ?

## 2021-05-11 NOTE — Procedures (Addendum)
Patient Name: Jill Jefferson  ?MRN: [id number]  ?Epilepsy Attending: Lora Havens  ?Referring Physician/Provider: Greta Doom, MD ?Duration: 05/10/2021 1924 to 05/11/2021 1035 ? ?Patient history: 61 year old female with a history of stroke as well as seizures presenting with focal status epilepticus.  EEG to evaluate for seizure. ? ?Level of alertness: Awake, asleep ? ?AEDs during EEG study: LEV, LCM, VPA ? ?Technical aspects: This EEG study was done with scalp electrodes positioned according to the 10-20 International system of electrode placement. Electrical activity was acquired at a sampling rate of 500Hz  and reviewed with a high frequency filter of 70Hz  and a low frequency filter of 1Hz . EEG data were recorded continuously and digitally stored.  ? ?Description: At the beginning of the study, EEG showed left posterior quadrant rhythmic 2 to 3 Hz delta activity with overriding 12 to 13 Hz beta activity admixed with spikes consistent with focal nonconvulsive status epilepticus.  As medications were adjusted, status epilepticus resolved at 2026 on 05/10/2021.  EEG then showed continuous generalized and maximal left posterior quadrant 3 to 7 Hz theta-delta slowing admixed with 15 to 18 Hz generalized beta activity.  EEG gradually continued to improve and showed  posterior dominant rhythm of 9-10 Hz activity of moderate voltage (25-35 uV) seen predominantly in posterior head regions, asymmetric ( left<right) and reactive to eye opening and eye closing. Sleep was characterized by vertex waves, sleep spindles (12 to 14 Hz), maximal frontocentral region. Hyperventilation and photic stimulation were not performed.    ? ?ABNORMALITY ?-Focal nonconvulsive status epilepticus, left posterior quadrant ?-Continuous slow, generalized and maximal left posterior quadrant ?-Background asymmetry, left<right ? ?IMPRESSION: ?This study initially showed focal nonconvulsive status epilepticus arising from left posterior  quadrant.  As medications were adjusted, status epilepticus resolved at 2026 on 05/10/2021.  EEG was then suggestive of moderate to severe diffuse encephalopathy, likely due to postictal state, medications.  EEG continue to improve and is now suggestive of cortical dysfunction in left posterior quadrant, likely due to underlying structural abnormality, postictal state.  No further seizures were seen during the study. ? ?Lora Havens  ? ?

## 2021-05-11 NOTE — Assessment & Plan Note (Addendum)
A1c is 11.6%.  Here she was hyperglycemic to 300. ?Now better. ?Patient demonstrates resistance to medication use, aversion to doctors. ?- Continue glargine ?- Continue sliding scale corrections ? ?

## 2021-05-11 NOTE — Progress Notes (Signed)
Inpatient Diabetes Program Recommendations ? ?AACE/ADA: New Consensus Statement on Inpatient Glycemic Control (2015) ? ?Target Ranges:  Prepandial:   less than 140 mg/dL ?     Peak postprandial:   less than 180 mg/dL (1-2 hours) ?     Critically ill patients:  140 - 180 mg/dL  ? ?Lab Results  ?Component Value Date  ? GLUCAP 123 (H) 05/11/2021  ? HGBA1C 11.4 (H) 05/11/2021  ? ? ?Review of Glycemic Control ? Latest Reference Range & Units 05/11/21 01:48 05/11/21 08:28 05/11/21 12:00  ?Glucose-Capillary 70 - 99 mg/dL 225 (H) 207 (H) 123 (H)  ?(H): Data is abnormally high ?Diabetes history: Type 2 DM ?Outpatient Diabetes medications: Novolog 5 units TID, Lantus 10 units QHS ?Current orders for Inpatient glycemic control: Semglee 8 units QD, Novolog 0-9 units Q4H ? ?Inpatient Diabetes Program Recommendations:   ? ?Attempted to see patient. Sleeping despite attempt. Not appropriate for discussion at this time. ? ?Thanks, ?Bronson Curb, MSN, RNC-OB ?Diabetes Coordinator ?[phone] (8a-5p) ? ? ? ? ?

## 2021-05-11 NOTE — Progress Notes (Signed)
?Progress Note ? ? ?PatientJenice Jefferson OVF:[id number] DOB: 1960/02/13 DOA: 05/10/2021     0 ?DOS: the patient was seen and examined on 05/11/2021 at 7:45AM ?  ? ? ? ?Brief hospital course: ?Mrs. Jill Jefferson is a 61 y.o. F with seizures (typically non-convulsive, presenting as aphasia), chronic kidney disease and DM who presented with decreased responsiveness. ? ?In USOH until day of admission, developed aphasia, increased right sided tone, abnormal eye movements. ? ?In the ER, given Ativan and underwent CTH which was normal, then loaded with Keppra.  EEG showed focal status epilepticus, and so she was loaded with Vimpat and Depakote as well, and admitted. ? ? ? ? ?Assessment and Plan: ?* Status epilepticus ([place]) ?Suspect medication nonadherence, although Depakote level normal. ?- Continue depakote, Keppra ?- Continue new Vimpat ?- Consult Neurology ? ? ?CKD (chronic kidney disease) stage 4, GFR 15-29 ml/min (HCC) ?Baseline creatinine 2.1-2.8.  Current creatinine function is within this range ?- Renally dose medications ?- Avoid nephrotoxins ? ? ?Hypertension ?Hypertensive urgency ?Blood pressure still severely elevated.  Mental status better, can take p.o.'s ?-Resume home amlodipine ?- Continue as needed labetalol ? ? ?Seizure disorder ([place]) ?See above ? ?Acute metabolic encephalopathy ?At baseline patient has no cognitive impairment.  On presentation she was unresponsive and unable to follow commands consistently, due to status epilepticus and post-ictal state.  She is not back to baseline but considerably better. ?- Seizure precautions ? ?Uncontrolled type 2 diabetes mellitus with hyperglycemia, with long-term current use of insulin ([place]) ? ? ? ?#) Diabetes mellitus: Documented history of such: On Lantus 10 units SQ nightly as well as NovoLog 5 units 3 times daily with meals.  Not on any oral hypoglycemic agents as an outpatient, including negative sulfonylureas.  Presenting blood sugar 306, without any evidence of  associated hypoglycemia, which is notable in the context of presenting breakthrough seizures.  ? ?Plan: In the setting of current n.p.o. status, will closely monitor ensuing blood sugars via every 6 hours Accu-Cheks with low-dose sliding scale insulin.  Hold home basal insulin for now.  Check hemoglobin A1c. ? ? ? ? ? ? ? ? ? ? ? ?Subjective: Patient still has some disfluency, but she is much more alert than previously, she is oriented to person, place, situation, daughter.  She has no cough, fever, abdominal pain, dysuria. ? ? ? ? ?Physical Exam: ?Vitals:  ? 05/11/21 0130 05/11/21 0200 05/11/21 0240 05/11/21 0500  ?BP: (!) 185/101 (!) 208/149  (!) 197/99  ?Pulse: 69 81  69  ?Resp: 15  16 15   ?Temp:      ?TempSrc:      ?SpO2: 100% 100%  100%  ?Weight:      ? ?Adult female, lying in bed, no obvious distress.  Appears tired. ?RRR, no murmurs, no peripheral edema ?Lungs clear without rales or wheezes, good air movement, normal respiratory effort ?Abdomen soft without tenderness palpation or guarding ?Attention normal, responds to questions, follows commands appropriately, has some disfluency, but no facial droop, no normal muscle bulk and tone, strength symmetric in the upper extremities, oriented to person, place, and time. ? ? ? ?Data Reviewed: ?Neurology notes reviewed, nursing notes reviewed, vital signs reviewed ?Hemoglobin A1c 11.4 ?Depakote level normal ?Complete blood count normal ?Creatinine 2.67, within her baseline range ?Glucose elevated ?CT head shows encephalomalacia, previously present, no other findings ? ? ? ? ? ?Family Communication: Daughter at the bedside ? ? ? ?Disposition: ?Status is: Observation ?The patient will require care spanning >  2 midnights and should be moved to inpatient because: As she presented with status epilepticus, requires continued ongoing long-term EEG monitoring, adjustment of antiepileptic medicines ? ? ? ? ? ? ? ? ? ?Author: ?Edwin Dada, MD ?05/11/2021 8:11  AM ? ?For on call review [email or web address].  ? ? ?

## 2021-05-11 NOTE — Assessment & Plan Note (Signed)
At baseline patient has no cognitive impairment.  On presentation she was unresponsive and unable to follow commands consistently, due to status epilepticus and post-ictal state.  She is not back to baseline but considerably better. ?- Seizure precautions ?

## 2021-05-12 LAB — GLUCOSE, CAPILLARY
Glucose-Capillary: 108 mg/dL — ABNORMAL HIGH (ref 70–99)
Glucose-Capillary: 140 mg/dL — ABNORMAL HIGH (ref 70–99)
Glucose-Capillary: 397 mg/dL — ABNORMAL HIGH (ref 70–99)
Glucose-Capillary: 71 mg/dL (ref 70–99)
Glucose-Capillary: 79 mg/dL (ref 70–99)

## 2021-05-12 LAB — BASIC METABOLIC PANEL
Anion gap: 7 (ref 5–15)
BUN: 38 mg/dL — ABNORMAL HIGH (ref 6–20)
CO2: 24 mmol/L (ref 22–32)
Calcium: 8.8 mg/dL — ABNORMAL LOW (ref 8.9–10.3)
Chloride: 107 mmol/L (ref 98–111)
Creatinine, Ser: 2.99 mg/dL — ABNORMAL HIGH (ref 0.44–1.00)
GFR, Estimated: 17 mL/min — ABNORMAL LOW (ref >60)
Glucose, Bld: 155 mg/dL — ABNORMAL HIGH (ref 70–99)
Potassium: 4.1 mmol/L (ref 3.5–5.1)
Sodium: 138 mmol/L (ref 135–145)

## 2021-05-12 LAB — TSH: TSH: 0.481 u[IU]/mL (ref 0.350–4.500)

## 2021-05-12 LAB — AMMONIA: Ammonia: 25 umol/L (ref 9–35)

## 2021-05-12 LAB — VALPROIC ACID LEVEL: Valproic Acid Lvl: 45 ug/mL — ABNORMAL LOW (ref 50.0–100.0)

## 2021-05-12 MED ORDER — SODIUM CHLORIDE 0.9 % IV SOLN: INTRAVENOUS | Status: DC

## 2021-05-12 MED ORDER — CARVEDILOL 6.25 MG PO TABS
6.2500 mg | ORAL_TABLET | Freq: Two times a day (BID) | ORAL | Status: DC
  Administered 2021-05-12 – 2021-05-13 (×3): 6.25 mg via ORAL
  Filled 2021-05-12 (×3): qty 1

## 2021-05-12 MED ORDER — DIVALPROEX SODIUM 250 MG PO DR TAB: 500.0000 mg | DELAYED_RELEASE_TABLET | Freq: Every day | ORAL | Status: DC

## 2021-05-12 MED ORDER — DIVALPROEX SODIUM 250 MG PO DR TAB
500.0000 mg | DELAYED_RELEASE_TABLET | Freq: Every day | ORAL | Status: DC
  Administered 2021-05-12: 500 mg via ORAL
  Filled 2021-05-12: qty 2

## 2021-05-12 MED ORDER — DIVALPROEX SODIUM 250 MG PO DR TAB
250.0000 mg | DELAYED_RELEASE_TABLET | Freq: Every morning | ORAL | Status: DC
Start: 2021-05-12 — End: 2021-05-12

## 2021-05-12 MED ORDER — DIVALPROEX SODIUM 250 MG PO DR TAB
250.0000 mg | DELAYED_RELEASE_TABLET | Freq: Every morning | ORAL | Status: DC
  Administered 2021-05-13: 250 mg via ORAL
  Filled 2021-05-12: qty 1

## 2021-05-12 NOTE — Evaluation (Signed)
Physical Therapy Evaluation ?Patient Details ?Name: Jill Jefferson ?MRN: [id number] ?DOB: [birth year] ?Today's Date: 05/12/2021 ? ?History of Present Illness ? 61 y/o female presented to ED on 05/10/21 as code stroke for R facial droop, R gaze, and aphasia. 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 PMH: cataracts, edema, diabetes, HTN, H.pylori infection, seizures  ?Clinical Impression ? Patient admitted with the above. Patient presents with weakness, impaired balance, decreased activity tolerance, dizziness, and impaired cognition. Patient with difficulty problem solving navigating out of room and difficulty wayfinding back to room with inability to locate room number on wall despite multimodal cueing. Patient ambulated in hallway with min guard but reaching out for rail in hallway for stability. Quick assessment of vision showed decreased peripheral vision bilaterally, notified MD. Patient will benefit from skilled PT services during acute stay to address listed deficits. Recommend OPPT at discharge to address balance, strength, and mobility deficits.  ?   ? ?Recommendations for follow up therapy are one component of a multi-disciplinary discharge planning process, led by the attending physician.  Recommendations may be updated based on patient status, additional functional criteria and insurance authorization. ? ?Follow Up Recommendations Outpatient PT ? ?  ?Assistance Recommended at Discharge Frequent or constant Supervision/Assistance  ?Patient can return home with the following ? Direct supervision/assist for financial management;Direct supervision/assist for medications management;Help with stairs or ramp for entrance;Assistance with cooking/housework ? ?  ?Equipment Recommendations None recommended by PT  ?Recommendations for Other Services ? OT consult  ?  ?Functional Status Assessment Patient has had a recent decline in  their functional status and demonstrates the ability to make significant improvements in function in a reasonable and predictable amount of time.  ? ?  ?Precautions / Restrictions Precautions ?Precautions: Fall ?Precaution Comments: visual deficits ?Restrictions ?Weight Bearing Restrictions: No  ? ?  ? ?Mobility ? Bed Mobility ?Overal bed mobility: Modified Independent ?  ?  ?  ?  ?  ?  ?  ?  ? ?Transfers ?Overall transfer level: Needs assistance ?Equipment used: None ?Transfers: Sit to/from Stand ?Sit to Stand: Supervision ?  ?  ?  ?  ?  ?General transfer comment: supervision for safety. Initially unsteady ?  ? ?Ambulation/Gait ?Ambulation/Gait assistance: Min guard ?Gait Distance (Feet): 75 Feet ?Assistive device: 1 person hand held assist (use of rail in hallway) ?Gait Pattern/deviations: Step-through pattern, Decreased stride length ?Gait velocity: decreased ?  ?  ?General Gait Details: patient reaching for rail in hallway for stability. Difficulty wayfinding back to room requiring cues for finding room number. ? ?Stairs ?  ?  ?  ?  ?  ? ?Wheelchair Mobility ?  ? ?Modified Rankin (Stroke Patients Only) ?  ? ?  ? ?Balance Overall balance assessment: Mild deficits observed, not formally tested ?  ?  ?  ?  ?  ?  ?  ?  ?  ?  ?  ?  ?  ?  ?  ?  ?  ?  ?   ? ? ? ?Pertinent Vitals/Pain Pain Assessment ?Pain Assessment: No/denies pain  ? ? ?Home Living Family/patient expects to be discharged to:: Private residence ?Living Arrangements: Children ?Available Help at Discharge: Family;Available PRN/intermittently ?Type of Home: Apartment (2nd floor) ?Home Access: Stairs to enter ?  ?Entrance Stairs-Number of Steps: flight ?  ?Home Layout: One level ?Home Equipment: None ?   ?  ?Prior Function Prior Level of Function : Independent/Modified Independent;Working/employed ?  ?  ?  ?  ?  ?  ?  Mobility Comments: works from home in [employer], [employer]. Does not drive ?  ?  ? ? ?Hand Dominance  ?   ? ?  ?Extremity/Trunk Assessment  ?  Upper Extremity Assessment ?Upper Extremity Assessment: Defer to OT evaluation ?  ? ?Lower Extremity Assessment ?Lower Extremity Assessment: Generalized weakness ?  ? ?Cervical / Trunk Assessment ?Cervical / Trunk Assessment: Normal  ?Communication  ? Communication: Expressive difficulties (slow to respond)  ?Cognition Arousal/Alertness: Awake/alert ?Behavior During Therapy: Flat affect ?Overall Cognitive Status: Impaired/Different from baseline ?Area of Impairment: Attention, Memory, Following commands, Safety/judgement, Awareness, Problem solving ?  ?  ?  ?  ?  ?  ?  ?  ?  ?Current Attention Level: Sustained ?Memory: Decreased short-term memory ?Following Commands: Follows one step commands inconsistently, Follows one step commands with increased time ?Safety/Judgement: Decreased awareness of safety, Decreased awareness of deficits ?Awareness: Emergent ?Problem Solving: Slow processing, Difficulty sequencing, Requires verbal cues ?General Comments: Difficulty problem solving how to walk out of the door into hallway with door closed. Patient walking directly in front of door on hinge side with no self correction. Requires cues to walk out of door but patient continued to stand in front of door until tactile cues to find door handle. Difficulty wayfinding back to room and attempting to enter wrong room. Patient had to stand directly in front of room number to identify her room number ?  ?  ? ?  ?General Comments   ? ?  ?Exercises    ? ?Assessment/Plan  ?  ?PT Assessment Patient needs continued PT services  ?PT Problem List Decreased strength;Decreased activity tolerance;Decreased balance;Decreased mobility;Decreased cognition;Decreased safety awareness ? ?   ?  ?PT Treatment Interventions DME instruction;Gait training;Stair training;Functional mobility training;Therapeutic activities;Therapeutic exercise;Balance training;Patient/family education   ? ?PT Goals (Current goals can be found in the Care Plan section)   ?Acute Rehab PT Goals ?Patient Stated Goal: did not state ?PT Goal Formulation: With patient/family ?Time For Goal Achievement: 05/26/21 ?Potential to Achieve Goals: Good ? ?  ?Frequency Min 3X/week ?  ? ? ?Co-evaluation   ?  ?  ?  ?  ? ? ?  ?AM-PAC PT "6 Clicks" Mobility  ?Outcome Measure Help needed turning from your back to your side while in a flat bed without using bedrails?: None ?Help needed moving from lying on your back to sitting on the side of a flat bed without using bedrails?: None ?Help needed moving to and from a bed to a chair (including a wheelchair)?: A Little ?Help needed standing up from a chair using your arms (e.g., wheelchair or bedside chair)?: A Little ?Help needed to walk in hospital room?: A Little ?Help needed climbing 3-5 steps with a railing? : A Little ?6 Click Score: 20 ? ?  ?End of Session Equipment Utilized During Treatment: Gait belt ?Activity Tolerance: Patient tolerated treatment well ?Patient left: in bed;with call bell/phone within reach;with bed alarm set ?Nurse Communication: Mobility status ?PT Visit Diagnosis: Muscle weakness (generalized) (M62.81);Unsteadiness on feet (R26.81) ?  ? ?Time: 2951-8841 ?PT Time Calculation (min) (ACUTE ONLY): 30 min ? ? ?Charges:   PT Evaluation ?$PT Eval Moderate Complexity: 1 Mod ?PT Treatments ?$Therapeutic Activity: 8-22 mins ?  ?   ? ? ?Mack Thurmon A. Gilford Rile, PT, DPT ?Acute Rehabilitation Services ?Pager [phone] ?Office [phone] ? ? ?Karlee Staff A Dontavia Brand ?05/12/2021, 1:00 PM ? ?

## 2021-05-12 NOTE — Progress Notes (Signed)
?  Transition of Care (TOC) Screening Note ? ? ?Patient Details  ?Name: Jill Jefferson ?Date of Birth: 07-11-1960 ? ? ?Transition of Care (TOC) CM/SW Contact:    ?Benard Halsted, LCSW ?Phone Number: ?05/12/2021, 9:10 AM ? ? ? ?Transition of Care Department [facility]) has reviewed patient and no TOC needs have been identified at this time. We will continue to monitor patient advancement through interdisciplinary progression rounds. If new patient transition needs arise, please place a TOC consult. ? ? ?

## 2021-05-12 NOTE — Evaluation (Signed)
Occupational Therapy Evaluation ?Patient Details ?Name: Jill Jefferson ?MRN: [id number] ?DOB: 1960-08-01 ?Today's Date: 05/12/2021 ? ? ?History of Present Illness 61 y/o female presented to ED on 05/10/21 as code stroke for R facial droop, R gaze, and aphasia. 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 PMH: cataracts, edema, diabetes, HTN, H.pylori infection, seizures  ? ?Clinical Impression ?  ?Patient admitted for the diagnosis above.  PTA she lives with her daughter, but needed only community mobility assist.  She continues to work from home.  Currently she is presenting with cognitive deficits, including decreased complex thought and decreased insight into her deficits, reducing her safety.  In addition, she is having visual difficulties with respect to her peripheral vision.  She is unable to visually scan her environment to find objects located in her room, or even use landmarks to locate items in her room.  The patient was unable to find the mirror in her room, despite max VC's to look above the sink.  Despite standing next to the sink basin, she required tactile cues to turn her shoulders to face the mirror.  OT will continue to follow in the acute setting, but she will need initial 24 hour supervision for IADL and stair management, and OT would recommended post acute visual testing.   ?   ? ?Recommendations for follow up therapy are one component of a multi-disciplinary discharge planning process, led by the attending physician.  Recommendations may be updated based on patient status, additional functional criteria and insurance authorization.  ? ?Follow Up Recommendations ? Other (comment) (Could consider visual screening from opthalmologist)  ?  ?Assistance Recommended at Discharge Intermittent Supervision/Assistance  ?Patient can return home with the following Help with stairs or ramp for entrance;Assist for  transportation;Assistance with cooking/housework;Direct supervision/assist for medications management ? ?  ?Functional Status Assessment ? Patient has had a recent decline in their functional status and demonstrates the ability to make significant improvements in function in a reasonable and predictable amount of time.  ?Equipment Recommendations ? None recommended by OT  ?  ?Recommendations for Other Services   ? ? ?  ?Precautions / Restrictions Precautions ?Precautions: Fall ?Precaution Comments: visual deficits ?Restrictions ?Weight Bearing Restrictions: No  ? ?  ? ?Mobility Bed Mobility ?Overal bed mobility: Needs Assistance ?Bed Mobility: Supine to Sit, Sit to Supine ?  ?  ?Supine to sit: Supervision ?Sit to supine: Supervision ?  ?  ?  ? ?Transfers ?Overall transfer level: Needs assistance ?Equipment used: None ?Transfers: Sit to/from Stand, Bed to chair/wheelchair/BSC ?Sit to Stand: Supervision ?  ?  ?Step pivot transfers: Supervision ?  ?  ?  ?  ? ?  ?Balance Overall balance assessment: Mild deficits observed, not formally tested ?  ?  ?  ?  ?  ?  ?  ?  ?  ?  ?  ?  ?  ?  ?  ?  ?  ?  ?   ? ?ADL either performed or assessed with clinical judgement  ? ?ADL   ?  ?  ?  ?  ?  ?  ?  ?  ?  ?  ?  ?  ?  ?  ?  ?  ?  ?  ?  ?General ADL Comments: Generalized supervision for safety  ? ? ? ?Vision Patient Visual Report: Peripheral vision impairment ?   ?   ?Perception Perception ?Perception: Impaired ?Spatial Orientation:  WFL ?Topographical Orientation: Difficulty orienting herself in hospital room, unable to use landmarks to locate objects in the room.  Visual disturbance may be contributing.  Patient needing Max VC's and locate mirror behind the sink basin. ?  ?Praxis Praxis ?Praxis: Impaired ?Praxis Impairment Details: Initiation;Ideation ?  ? ?Pertinent Vitals/Pain Pain Assessment ?Pain Assessment: Faces ?Faces Pain Scale: Hurts a little bit ?Pain Location: HA ?Pain Descriptors / Indicators: Aching ?Pain  Intervention(s): Monitored during session  ? ? ? ?Hand Dominance Right ?  ?Extremity/Trunk Assessment Upper Extremity Assessment ?Upper Extremity Assessment: Overall WFL for tasks assessed ?  ?Lower Extremity Assessment ?Lower Extremity Assessment: Defer to PT evaluation ?  ?Cervical / Trunk Assessment ?Cervical / Trunk Assessment: Normal ?  ?Communication Communication ?Communication: Expressive difficulties ?  ?Cognition Arousal/Alertness: Awake/alert ?Behavior During Therapy: Flat affect ?Overall Cognitive Status: Impaired/Different from baseline ?  ?  ?  ?  ?  ?  ?  ?  ?  ?  ?  ?  ?Following Commands: Follows one step commands inconsistently, Follows one step commands with increased time ?Safety/Judgement: Decreased awareness of safety, Decreased awareness of deficits ?Awareness: Emergent ?Problem Solving: Slow processing, Difficulty sequencing, Requires verbal cues ?General Comments: Decreased insight into deficits ?  ?  ?General Comments    ? ?  ?Exercises   ?  ?Shoulder Instructions    ? ? ?Home Living Family/patient expects to be discharged to:: Private residence ?Living Arrangements: Children ?Available Help at Discharge: Family;Available PRN/intermittently ?Type of Home: Apartment ?Home Access: Stairs to enter ?Entrance Stairs-Number of Steps: flight ?  ?Home Layout: One level ?  ?  ?Bathroom Shower/Tub: Tub/shower unit;Walk-in shower ?  ?Bathroom Toilet: Standard ?Bathroom Accessibility: Yes ?How Accessible: Accessible via walker ?Home Equipment: None ?  ?  ?  ? ?  ?Prior Functioning/Environment Prior Level of Function : Independent/Modified Independent;Working/employed ?  ?  ?  ?  ?  ?  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 Does not drive ?ADLs Comments: No assist with bathing, dressing, home mangement or meals. ?  ? ?  ?  ?OT Problem List: Decreased safety awareness;Impaired vision/perception ?  ?   ?OT Treatment/Interventions: Self-care/ADL training;Therapeutic  activities;Visual/perceptual remediation/compensation  ?  ?OT Goals(Current goals can be found in the care plan section) Acute Rehab OT Goals ?Patient Stated Goal: Going home tomorrow ?OT Goal Formulation: With patient ?Time For Goal Achievement: 05/26/21 ?Potential to Achieve Goals: Good ?ADL Goals ?Pt Will Perform Grooming: Independently;standing ?Pt Will Perform Upper Body Dressing: Independently;sitting;standing ?Pt Will Perform Lower Body Dressing: Independently;sit to/from stand ?Pt Will Transfer to Toilet: Independently;ambulating;regular height toilet  ?OT Frequency: Min 2X/week ?  ? ?Co-evaluation   ?  ?  ?  ?  ? ?  ?AM-PAC OT "6 Clicks" Daily Activity     ?Outcome Measure Help from another person eating meals?: None ?Help from another person taking care of personal grooming?: A Little ?Help from another person toileting, which includes using toliet, bedpan, or urinal?: A Little ?Help from another person bathing (including washing, rinsing, drying)?: A Little ?Help from another person to put on and taking off regular upper body clothing?: A Little ?Help from another person to put on and taking off regular lower body clothing?: A Little ?6 Click Score: 19 ?  ?End of Session Nurse Communication: Mobility status ? ?Activity Tolerance: Patient tolerated treatment well ?Patient left: in bed;with call bell/phone within reach ? ?OT Visit Diagnosis: Other symptoms and signs involving cognitive function;Low vision, both eyes (H54.2)  ?              ?  Time: 2824-1753 ?OT Time Calculation (min): 20 min ?Charges:  OT General Charges ?$OT Visit: 1 Visit ?OT Evaluation ?$OT Eval Moderate Complexity: 1 Mod ? ?05/12/2021 ? ?RP, OTR/L ? ?Acute Rehabilitation Services ? ?Office:  [phone] ? ? ?Maikayla Beggs D Antanasia Kaczynski ?05/12/2021, 3:09 PM ?

## 2021-05-12 NOTE — Plan of Care (Signed)

## 2021-05-12 NOTE — Progress Notes (Signed)
Inpatient Diabetes Program Recommendations ? ?AACE/ADA: New Consensus Statement on Inpatient Glycemic Control (2015) ? ?Target Ranges:  Prepandial:   less than 140 mg/dL ?     Peak postprandial:   less than 180 mg/dL (1-2 hours) ?     Critically ill patients:  140 - 180 mg/dL  ? ?Lab Results  ?Component Value Date  ? GLUCAP 140 (H) 05/12/2021  ? HGBA1C 11.4 (H) 05/11/2021  ? ? ?Review of Glycemic Control ? ?Diabetes history: DM2 ?Outpatient Diabetes medications: Lantus 10 units QD, Novolog 5 units TID ?Current orders for Inpatient glycemic control: Semglee 8 units QD, Novolog 0-9 units Q6H ? ?Spoke with patient at bedside.  Introduced myself and explained my role.  She closed her eyes and laid her head back and was minimally conversational.   ? ?Reviewed patient's current A1c of 11.4% (average BG . Explained what a A1c is and what it measures. Also reviewed goal A1c with patient, importance of good glucose control @ home, and blood sugar goals. ? ?She does not take her insulin consistently (only if her am BG is <100 mg/dL).  She states she checks her BG every morning.  She states she does not like the way insulin makes her feel.  She does not see her PCP consistently because they always tell her she needs to take her insulin.   ? ?Mentioned speaking with her PCP about alternatives such as a GLP if she refuses to take insulin.   ? ?Will continue to follow while inpatient. ? ?Thank you, ?Reche Dixon, MSN, RN ?Diabetes Coordinator ?Inpatient Diabetes Program ?[phone] (team pager from 8a-5p) ? ? ? ? ? ? ? ? ?

## 2021-05-12 NOTE — Progress Notes (Signed)
?  Progress Note ? ? ?PatientShea Jefferson QAS:[id number] DOB: 1960/05/04 DOA: 05/10/2021     1 ?DOS: the patient was seen and examined on 05/12/2021 at 11:50AM ?  ? ? ? ?Brief hospital course: ?Jill Jefferson is a 61 y.o. F with seizures (typically non-convulsive, presenting as aphasia), chronic kidney disease and DM who presented with decreased responsiveness. ? ?In USOH until day of admission, developed aphasia, increased right sided tone, abnormal eye movements. ? ?In the ER, given Ativan and underwent CTH which was normal, then loaded with Keppra.  EEG showed focal status epilepticus, and so she was loaded with Vimpat and Depakote as well, and admitted. ? ? ? ? ?Assessment and Plan: ?* Status epilepticus ([place]) ?Suspect medication nonadherence, although Depakote level normal. ?- Continue depakote, Keppra ?- Continue new Vimpat ?- Consult Neurology ? ? ?CKD (chronic kidney disease) stage 4, GFR 15-29 ml/min (HCC) ?Baseline creatinine 2.1-2.8.  Current creatinine function is within this range ? ?Here, Cr up to 2.9 today in setting of severe hypertension, dizziness. ?- Renally dose medications ?- Avoid nephrotoxins ?- IV fluids overnight ? ? ?Hypertension ?Hypertensive urgency ?Blood pressure improved yesterday but elevated most of today.  Dizzy and with worsening renal function.   ?- Resume home amlodipine ?- Start Coreg ? ? ?Seizure disorder ([place]) ?See above ? ?Acute metabolic encephalopathy ?At baseline patient has no cognitive impairment.  On presentation she was unresponsive and unable to follow commands consistently, due to status epilepticus and post-ictal state.  She is not back to baseline but considerably better. ?- Seizure precautions ? ?Uncontrolled type 2 diabetes mellitus with hyperglycemia, with long-term current use of insulin ([place]) ?A1c is 11.6%.  Here she was hyperglycemic to 300. ?Now better. ?Patient demonstrates resistance to medication use, aversion to doctors. ?- Continue glargine ?- Continue  sliding scale corrections ? ? ? ? ? ? ? ? ? ? ?Subjective: Headache, dizzy. Weak. Still with hemianopia, no fever, no cough, no dyspnea, no swelling. ? ? ? ? ?Physical Exam: ?Vitals:  ? 05/12/21 0500 05/12/21 0806 05/12/21 1134 05/12/21 1611  ?BP:  (!) 167/97 (!) 156/82 (!) 150/77  ?Pulse:  76 85 89  ?Resp:  10 14 15   ?Temp:  97.6 ?F (36.4 ?C) 97.8 ?F (36.6 ?C) (!) 96.6 ?F (35.9 ?C)  ?TempSrc:  Axillary Oral Axillary  ?SpO2:      ?Weight: 61.5 kg     ? ?Adult female, lying in bed, moderate psychomotor slowing, seems somewhat postictal still ?RRR, no murmurs, no peripheral edema, no JVD ?Respiratory effort normal, clear without rales or wheezes ?Abdomen soft no tenderness palpation or guarding ?Finger-nose testing normal, psychomotor slowing noted, speech slow and halting, but oriented to person, place, time, and situation, strength symmetric in upper and lower extremities bilaterally, mild right hemianopia. ? ?Data Reviewed: ?Discussed with neurology, nursing notes reviewed, vital signs reviewed ?Creatinine up to 2.99 ?TSH normal ?Glucose normal ? ?Family Communication: daughter at bedside ? ? ? ?Disposition: ?Status is: Inpatient ? ? ? ? ? ? ? ? ?Author: ?Edwin Dada, MD ?05/12/2021 5:36 PM ? ?For on call review [email or web address].  ? ? ?

## 2021-05-12 NOTE — Progress Notes (Signed)
Subjective: No further seizures. Daughter at bedside. Patient reports feeling light headed. Denies headache. States she doesn't like seeing doctors or tasking medications.  ? ?ROS: negative except above ? ?Examination ? ?Vital signs in last 24 hours: ?Temp:  [97.6 ?F (36.4 ?C)-98.5 ?F (36.9 ?C)] 97.8 ?F (36.6 ?C) (05/05 1134) ?Pulse Rate:  [66-85] 85 (05/05 1134) ?Resp:  [10-16] 14 (05/05 1134) ?BP: (116-167)/(62-97) 156/82 (05/05 1134) ?SpO2:  [97 %-100 %] 98 % (05/04 2315) ?Weight:  [61.5 kg] 61.5 kg (05/05 0500) ? ?General: lying in bed, NAD ?Neuro: awake, alert, oriented to self, place and time, no aphasia, patient at times is able to count fingers and movement appropriately but at other times appears to have right hemianopia, PERLA, EOMI with left gaze preference, no facial asymmetry, 5/5 in all extremities. Didn't appreciate any edema on fundoscopic exam but patient was able to cooperate so limited exam ? ?Basic Metabolic Panel: ?Recent Labs  ?Lab 05/10/21 ?1747 05/10/21 ?1752 05/10/21 ?2133 05/11/21 ?0430 05/12/21 ?2952  ?NA 135 133*  --  138 138  ?K 4.6 4.5  --  4.4 4.1  ?CL 100 102  --  104 107  ?CO2 25  --   --  23 24  ?GLUCOSE 306* 316*  --  203* 155*  ?BUN 33* 36*  --  28* 38*  ?CREATININE 2.86* 2.90*  --  2.67* 2.99*  ?CALCIUM 9.1  --   --  9.1 8.8*  ?MG  --   --  1.9 2.0  --   ? ? ?CBC: ?Recent Labs  ?Lab 05/10/21 ?1747 05/10/21 ?1752 05/11/21 ?0430  ?WBC 7.1  --  6.4  ?NEUTROABS 3.5  --  3.9  ?HGB 12.7 13.6 12.4  ?HCT 39.6 40.0 39.2  ?MCV 83.0  --  83.1  ?PLT 232  --  233  ? ? ? ?Coagulation Studies: ?Recent Labs  ?  05/10/21 ?1747  ?LABPROT 12.5  ?INR 0.9  ? ? ?Imaging ?NO new brain imaging ? ? ?ASSESSMENT AND PLAN:  61 year old female with a history of stroke as well as seizures presenting with focal status epilepticus due to medication non -compliance.  ?  ?Focal convulsive status epilepticus, resolved ?Epilepsy with breakthrough seizure ?Right hemianopia ?Empty sella ?Light headedness ?- No  further seizures overnight ?  ?Recommendations ?- Contiue keppra 500mg  BID, VPA 250mg  QAM and 500mg  QHS.  ?- Discussed importance of medication compliance ?- Continue seizure precautions including DO NOT drive ?- PRN IV ativan for GTC>2 mins ?- Management of rest of comorbidities per pri team ?- F/U with neuro in 2-3 months and ophthalmology asap to assess for visual fields and fundus examination ?- Discussed plan with Dr Loleta Books ?  ?I have spent a total of  26  minutes with the patient reviewing hospital notes,  test results, labs and examining the patient as well as establishing an assessment and plan that was discussed personally with the patient and daughter at bedside.  > 50% of time was spent in direct patient care. ?  ?Zeb Comfort ?Epilepsy ?[facility] ?For questions after 5pm please refer to AMION to reach the Neurologist on call ? ?

## 2021-05-13 ENCOUNTER — Encounter (HOSPITAL_COMMUNITY): Payer: Self-pay | Admitting: Family Medicine

## 2021-05-13 LAB — GLUCOSE, CAPILLARY
Glucose-Capillary: 76 mg/dL (ref 70–99)
Glucose-Capillary: 223 mg/dL — ABNORMAL HIGH (ref 70–99)
Glucose-Capillary: 173 mg/dL — ABNORMAL HIGH (ref 70–99)

## 2021-05-13 LAB — BASIC METABOLIC PANEL
Anion gap: 6 (ref 5–15)
BUN: 48 mg/dL — ABNORMAL HIGH (ref 6–20)
CO2: 24 mmol/L (ref 22–32)
Calcium: 8.5 mg/dL — ABNORMAL LOW (ref 8.9–10.3)
Chloride: 107 mmol/L (ref 98–111)
Creatinine, Ser: 3.15 mg/dL — ABNORMAL HIGH (ref 0.44–1.00)
GFR, Estimated: 16 mL/min — ABNORMAL LOW (ref >60)
Glucose, Bld: 99 mg/dL (ref 70–99)
Potassium: 4.3 mmol/L (ref 3.5–5.1)
Sodium: 137 mmol/L (ref 135–145)

## 2021-05-13 MED ORDER — OZEMPIC (1 MG/DOSE) 4 MG/3ML ~~LOC~~ SOPN: 1.0000 mg | PEN_INJECTOR | SUBCUTANEOUS | 11 refills | Status: DC

## 2021-05-13 MED ORDER — DIVALPROEX SODIUM ER 250 MG PO TB24: 750.0000 mg | ORAL_TABLET | Freq: Every day | ORAL | 11 refills | Status: DC

## 2021-05-13 MED ORDER — METFORMIN HCL 500 MG PO TABS: 1000.0000 mg | ORAL_TABLET | Freq: Two times a day (BID) | ORAL | 11 refills | Status: DC

## 2021-05-13 MED ORDER — CARVEDILOL 6.25 MG PO TABS: 6.2500 mg | ORAL_TABLET | Freq: Two times a day (BID) | ORAL | 3 refills | Status: DC

## 2021-05-13 MED ORDER — LEVETIRACETAM 500 MG PO TABS: 500.0000 mg | ORAL_TABLET | Freq: Two times a day (BID) | ORAL | 11 refills | Status: DC

## 2021-05-13 MED ORDER — CHLORTHALIDONE 25 MG PO TABS: 25.0000 mg | ORAL_TABLET | Freq: Every day | ORAL | 3 refills | Status: DC

## 2021-05-13 NOTE — Discharge Summary (Signed)
?Physician Discharge Summary ?  ?Patient: Jill Jefferson MRN: [id number] DOB: [birth year]  ?Admit date:     05/10/2021  ?Discharge date: 05/13/21  ?Discharge Physician: Edwin Dada  ? ?PCP: Nolene Ebbs, MD  ? ? ? ?Recommendations at discharge:  ?Follow up with [facility] as soon as scheduled, referral sent ?Follow up with Neurology in 2-3 months, referral sent ?Follow up with PCP Dr. Jeanie Cooks in 1 week ?Dr. Jeanie Cooks: Please check BMP on new chlorthalidone ?Dr. Jeanie Cooks: Please check BP and titrate antihypertensives as needed, attention to side effects ?Dr. Jeanie Cooks: Please follow up glucose log and titrate anti-diabetics  ?Dr. Jeanie Cooks: Please refer to Ophthalmology for dilated fundoscopic exam given right hemianopia ? ? ? ? ?Discharge Diagnoses: ?Principal Problem: ?  Focal convulsive status epilepticus ?Active Problems: ?  Epilepsy with breakthrough seizure ?  CKD (chronic kidney disease) stage 4, GFR 15-29 ml/min (HCC) ?  Uncontrolled type 2 diabetes mellitus with hyperglycemia, with long-term current use of insulin ([place]) ?  Acute metabolic encephalopathy ?  Seizure disorder ([place]) ?  Hypertension ?   ?  Empty [place] ?  Right hemianopia ?  ? ? ? ? ? ?Hospital Course: ?[name] is a 61 y.o. F with seizures (typically non-convulsive, presenting as aphasia), chronic kidney disease and DM who presented with decreased responsiveness. ? ?In USOH until day of admission, developed aphasia, increased right sided tone, abnormal eye movements. ? ?In the ER, given Ativan and underwent CTH which was normal, then loaded with Keppra.  EEG showed focal status epilepticus, and so she was loaded with Vimpat and Depakote as well, and admitted. ? ? ? ? ? ?* Status epilepticus ([place]) ?Suspect medication nonadherence.  Patient restarted on her home regimen of Depakote and Keppra and no further seizures.   ? ?Refills prescribed and Neurology referral sent. ? ? ?CKD (chronic kidney disease) stage 4, GFR 15-29 ml/min  (HCC) ?Baseline creatinine 2.1-2.8 over last 2-3 years.  Prior proteinuria noted, and here, no symptoms of myeloma and urine sediment bland.   ? ?Nephrology consulted who sent serologies and recommended outpatient follow up. ? 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   ? ?Nephrology referral sent. ?- Does not tolerate amlodipine ?- Discharged on Coreg and chlorthalidone with close PCP follow up ? ?   ? ?Uncontrolled type 2 diabetes mellitus with hyperglycemia, with long-term current use of insulin ([place]) ?A1c is 11.6%.  Here she was hyperglycemic to 300 on admission. ? ?Treated with low dose Lantus here and glucoses much better controlled.   ?- She has not tolerated Lantus in the past. ?- Discharged on metformin and Ozempic with close PCP follow up  ? ? ? ? ? ?  ? ? ? ? ? ?The [facility] Controlled Substances Registry was reviewed for this patient prior to discharge.  ? ?Consultants: Nephrology, Neurology ?Procedures performed: EEG, CT head  ?Disposition: Home ? ? ?DISCHARGE MEDICATION: ?Allergies as of 05/13/2021   ?No Known Allergies ?  ? ?  ?Medication List  ?  ? ?STOP taking these medications   ? ?amLODipine 10 MG tablet ?Commonly known as: Norvasc ?  ?atorvastatin 40 MG tablet ?Commonly known as: LIPITOR ?  ?Lantus 100 UNIT/ML injection ?Generic drug: insulin glargine ?  ?NovoLOG 100 UNIT/ML injection ?Generic drug: insulin aspart ?  ?TRUEplus Insulin Syringe 30G X 5/16" 0.3 ML Misc ?Generic drug: Insulin Syringe-Needle U-100 ?  ? ?  ? ?TAKE these medications   ? ?carvedilol 6.25 MG tablet ?  Commonly known as: COREG ?Take 1 tablet (6.25 mg total) by mouth 2 (two) times daily with a meal. ?  ?chlorthalidone 25 MG tablet ?Commonly known as: HYGROTON ?Take 1 tablet (25 mg total) by mouth daily. ?  ?divalproex 250 MG 24 hr tablet ?Commonly known as: DEPAKOTE ER ?Take 3 tablets (750 mg total) by mouth daily. ?What changed:  ?how much to take ?when to take this ?   ?levETIRAcetam 500 MG tablet ?Commonly known as: KEPPRA ?Take 1 tablet (500 mg total) by mouth 2 (two) times daily. ?  ?metFORMIN 500 MG tablet ?Commonly known as: Glucophage ?Take 2 tablets (1,000 mg total) by mouth 2 (two) times daily with a meal. ?  ?Ozempic (1 MG/DOSE) 4 MG/3ML Sopn ?Generic drug: Semaglutide (1 MG/DOSE) ?Inject 1 mg into the skin once a week. ?  ? ?  ? ? Follow-up Information   ? ? Nolene Ebbs, MD. Schedule an appointment as soon as possible for a visit in 1 week(s).   ?Specialty: Internal Medicine ?Contact information: ?[address] ?[place] [address] ?[phone] ? ? ?  ?  ? ? [facility] Follow up.   ?Why: They will call you for a Neurology appointment ?Contact information: ?[address] ?[place] ?[phone] ? ?  ?  ? ? [facility] [place] Follow up.   ?Why: They will call you for a Nephrology appointment ?Contact information: ?[address] ?[place] [address] ?[phone] ? ? ?  ?  ? ?  ?  ? ?  ? ? ?Discharge Instructions   ? ? Ambulatory referral to Neurology   Complete by: As directed ?  ? An appointment is requested in approximately: 8 weeks ?To epileptologist if possible for seizures  ? Discharge instructions   Complete by: As directed ?  ? From Dr. Loleta Books: ?You were admitted for seizure. ?You should take the seizure medicines Depakote and Keppra ?Take Depakote 750 mg (3 tabs) daily  ?You can take these all together ? ?Take Keppra 500 mg (1 tab) twice daily ? ?Follow up with the Neurologists at [facility] or [place] ?They will reach out to you for an appointment ?If you haven't heard from them in a week, call the number below and try to follow up  ? ? ? ?For the kidneys: ?[facility] will reach out to you for an appointment ?It is important you go to a Kidney specialist ?If you haven't heard from them, either call the number below or ask Dr. Jeanie Cooks to refer you ?Get your blood sugar and blood pressure controlled by any  means necessary ? ? ? ? ?For the blood pressure: ?Take carvedilol 6.25 mg twice daily ?Take chlorthalidone 25 mg daily ?Go see Dr. Jeanie Cooks in 1 week and have him check your labs ? ? ? ?For the blood sugar: ?I sent the Ozempic ?Take 1 ml once weekly ? ?Also, take metformin ?I recommend you slowly titrate this up. ?Take metformin 500 mg once daily for 7 days then take metformin 500 mg twice daily for 7 days ?Then take 1000 mg (two tabs) metformin in the morning and 500 at night for 7 days ?Ultimately, you should be taking 1000 mg (two tabs) in the morning and 1000 mg at night ?Dr. Jeanie Cooks can switch the pills to the 1000 mg size  ? Increase activity slowly   Complete by: As directed ?  ? ?  ? ? ?Discharge Exam: ?Filed Weights  ? 05/10/21 1747 05/12/21 0500 05/13/21 0500  ?  Weight: 62 kg 61.5 kg 61.9 kg  ? ? ?General: Pt is alert, awake, not in acute distress ?Cardiovascular: RRR, nl S1-S2, no murmurs appreciated.   No LE edema.   ?Respiratory: Normal respiratory rate and rhythm.  CTAB without rales or wheezes. ?Abdominal: Abdomen soft and non-tender.  No distension or HSM.   ?Neuro/Psych: Strength symmetric in upper and lower extremities.  Judgment and insight appear normal. ? ? ?Condition at discharge: good ? ?The results of significant diagnostics from this hospitalization (including imaging, microbiology, ancillary and laboratory) are listed below for reference.  ? ?Imaging Studies: ?EEG adult ? ?Result Date: 05/10/2021 ?Greta Doom, MD     05/10/2021  7:43 PM History: 61 yo F with seizures and no return to baseline. Sedation: Ativan prior to EEG Technique: This EEG was acquired with electrodes placed according to the International 10-20 electrode system (including Fp1, Fp2, F3, F4, C3, C4, P3, P4, O1, O2, T3, T4, T5, T6, A1, A2, Fz, Cz, Pz). The following electrodes were missing or displaced: none. Background: The background is markedly asymmetric with a posterior dominant rhythm on the right of 9 to 10 Hz.   On the left, there is occipitally predominant sharply contoured fast activity superimposed on rhythmic delta.  There is evolution in field, frequency, and morphology of this activity consistent with partial status epile

## 2021-05-13 NOTE — Plan of Care (Signed)
?Problem: Education: ?Goal: Expressions of having a comfortable level of knowledge regarding the disease process will increase ?05/13/2021 2002 by Henrine Screws, RN ?Outcome: Adequate for Discharge ?05/13/2021 0618 by Henrine Screws, RN ?Outcome: Progressing ?  ?Problem: Coping: ?Goal: Ability to adjust to condition or change in health will improve ?05/13/2021 2002 by Henrine Screws, RN ?Outcome: Adequate for Discharge ?05/13/2021 0618 by Henrine Screws, RN ?Outcome: Progressing ?Goal: Ability to identify appropriate support needs will improve ?05/13/2021 2002 by Henrine Screws, RN ?Outcome: Adequate for Discharge ?05/13/2021 0618 by Henrine Screws, RN ?Outcome: Progressing ?  ?Problem: Health Behavior/Discharge Planning: ?Goal: Compliance with prescribed medication regimen will improve ?05/13/2021 2002 by Henrine Screws, RN ?Outcome: Adequate for Discharge ?05/13/2021 0618 by Henrine Screws, RN ?Outcome: Progressing ?  ?Problem: Medication: ?Goal: Risk for medication side effects will decrease ?05/13/2021 2002 by Henrine Screws, RN ?Outcome: Adequate for Discharge ?05/13/2021 0618 by Henrine Screws, RN ?Outcome: Progressing ?  ?Problem: Clinical Measurements: ?Goal: Complications related to the disease process, condition or treatment will be avoided or minimized ?05/13/2021 2002 by Henrine Screws, RN ?Outcome: Adequate for Discharge ?05/13/2021 0618 by Henrine Screws, RN ?Outcome: Progressing ?Goal: Diagnostic test results will improve ?05/13/2021 2002 by Henrine Screws, RN ?Outcome: Adequate for Discharge ?05/13/2021 0618 by Henrine Screws, RN ?Outcome: Progressing ?  ?Problem: Safety: ?Goal: Verbalization of understanding the information provided will improve ?05/13/2021 2002 by Henrine Screws, RN ?Outcome: Adequate for Discharge ?05/13/2021 0618 by Henrine Screws, RN ?Outcome: Progressing ?  ?Problem: Self-Concept: ?Goal: Level of anxiety will  decrease ?05/13/2021 2002 by Henrine Screws, RN ?Outcome: Adequate for Discharge ?05/13/2021 0618 by Henrine Screws, RN ?Outcome: Progressing ?Goal: Ability to verbalize feelings about condition will improve ?05/13/2021 2002 by Henrine Screws, RN ?Outcome: Adequate for Discharge ?05/13/2021 0618 by Henrine Screws, RN ?Outcome: Progressing ?  ?Problem: Education: ?Goal: Knowledge of General Education information will improve ?Description: Including pain rating scale, medication(s)/side effects and non-pharmacologic comfort measures ?05/13/2021 2002 by Henrine Screws, RN ?Outcome: Adequate for Discharge ?05/13/2021 0618 by Henrine Screws, RN ?Outcome: Progressing ?  ?Problem: Health Behavior/Discharge Planning: ?Goal: Ability to manage health-related needs will improve ?05/13/2021 2002 by Henrine Screws, RN ?Outcome: Adequate for Discharge ?05/13/2021 0618 by Henrine Screws, RN ?Outcome: Progressing ?  ?Problem: Clinical Measurements: ?Goal: Ability to maintain clinical measurements within normal limits will improve ?05/13/2021 2002 by Henrine Screws, RN ?Outcome: Adequate for Discharge ?05/13/2021 0618 by Henrine Screws, RN ?Outcome: Progressing ?Goal: Will remain free from infection ?05/13/2021 2002 by Henrine Screws, RN ?Outcome: Adequate for Discharge ?05/13/2021 0618 by Henrine Screws, RN ?Outcome: Progressing ?Goal: Diagnostic test results will improve ?05/13/2021 2002 by Henrine Screws, RN ?Outcome: Adequate for Discharge ?05/13/2021 0618 by Henrine Screws, RN ?Outcome: Progressing ?Goal: Respiratory complications will improve ?05/13/2021 2002 by Henrine Screws, RN ?Outcome: Adequate for Discharge ?05/13/2021 0618 by Henrine Screws, RN ?Outcome: Progressing ?Goal: Cardiovascular complication will be avoided ?05/13/2021 2002 by Henrine Screws, RN ?Outcome: Adequate for Discharge ?05/13/2021 0618 by Henrine Screws, RN ?Outcome: Progressing ?   ?Problem: Activity: ?Goal: Risk for activity intolerance will decrease ?05/13/2021 2002 by Henrine Screws, RN ?Outcome: Adequate for  Discharge ?05/13/2021 0618 by Henrine Screws, RN ?Outcome: Progressing ?  ?Problem: Nutrition: ?Goal: Adequate nutrition will be maintained ?05/13/2021 2002 by Henrine Screws, RN ?Outcome: Adequate for Discharge ?05/13/2021 0618 by Henrine Screws, RN ?Outcome: Progressing ?  ?Problem: Coping: ?Goal: Level of anxiety will decrease ?05/13/2021 2002 by Henrine Screws, RN ?Outcome: Adequate for Discharge ?05/13/2021 0618 by Henrine Screws, RN ?Outcome: Progressing ?  ?Problem: Elimination: ?Goal: Will not experience complications related to bowel motility ?05/13/2021 2002 by Henrine Screws, RN ?Outcome: Adequate for Discharge ?05/13/2021 0618 by Henrine Screws, RN ?Outcome: Progressing ?Goal: Will not experience complications related to urinary retention ?05/13/2021 2002 by Henrine Screws, RN ?Outcome: Adequate for Discharge ?05/13/2021 0618 by Henrine Screws, RN ?Outcome: Progressing ?  ?Problem: Pain Managment: ?Goal: General experience of comfort will improve ?05/13/2021 2002 by Henrine Screws, RN ?Outcome: Adequate for Discharge ?05/13/2021 0618 by Henrine Screws, RN ?Outcome: Progressing ?  ?Problem: Safety: ?Goal: Ability to remain free from injury will improve ?05/13/2021 2002 by Henrine Screws, RN ?Outcome: Adequate for Discharge ?05/13/2021 0618 by Henrine Screws, RN ?Outcome: Progressing ?  ?Problem: Skin Integrity: ?Goal: Risk for impaired skin integrity will decrease ?05/13/2021 2002 by Henrine Screws, RN ?Outcome: Adequate for Discharge ?05/13/2021 0618 by Henrine Screws, RN ?Outcome: Progressing ?  ?Problem: Acute Rehab PT Goals(only PT should resolve) ?Goal: Patient Will Transfer Sit To/From Stand ?Outcome: Adequate for Discharge ?Goal: Pt Will Ambulate ?Outcome: Adequate for Discharge ?Goal: Pt Will Go Up/Down  Stairs ?Outcome: Adequate for Discharge ?Goal: Pt/caregiver will Perform Home Exercise Program ?Outcome: Adequate for Discharge ?  ?Problem: Acute Rehab OT Goals (only OT should resolve) ?Goal: Pt. Will Perform Grooming ?Outcome: Adequate for Discharge ?Goal: Pt. Will Perform Upper Body Dressing ?Outcome: Adequate for Discharge ?Goal: Pt. Will Perform Lower Body Dressing ?Outcome: Adequate for Discharge ?Goal: Pt. Will Transfer To Toilet ?Outcome: Adequate for Discharge ?  ?

## 2021-05-13 NOTE — Progress Notes (Signed)
Physical Therapy Treatment ?Patient Details ?Name: Jill Jefferson ?MRN: [id number] ?DOB: 03/24/1960 ?Today's Date: 05/13/2021 ? ? ?History of Present Illness 61 y/o female presented to ED on 05/10/21 as code stroke for R facial droop, R gaze, and aphasia. 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 PMH: cataracts, edema, diabetes, HTN, H.pylori infection, seizures ? ?  ?PT Comments  ? ? Patient continues to be limited by visual and cognitive deficits putting her at high risk for fall. Patient demonstrates difficulty with environmental scanning during mobility and not able to identify objects in her periphery. Patient requires max cues to find objects on walls and follow >1 step commands. Overall ambulating at supervision level. Educated daughter on visual and cognitive deficits and need for supervision for safety at home, daughter verbalized understanding. D/c plan remains appropriate.  ?   ?Recommendations for follow up therapy are one component of a multi-disciplinary discharge planning process, led by the attending physician.  Recommendations may be updated based on patient status, additional functional criteria and insurance authorization. ? ?Follow Up Recommendations ? Outpatient PT ?  ?  ?Assistance Recommended at Discharge Frequent or constant Supervision/Assistance  ?Patient can return home with the following Direct supervision/assist for financial management;Direct supervision/assist for medications management;Help with stairs or ramp for entrance;Assistance with cooking/housework ?  ?Equipment Recommendations ? None recommended by PT  ?  ?Recommendations for Other Services OT consult ? ? ?  ?Precautions / Restrictions Precautions ?Precautions: Fall ?Precaution Comments: visual deficits ?Restrictions ?Weight Bearing Restrictions: No  ?  ? ?Mobility ? Bed Mobility ?Overal bed mobility: Needs Assistance ?Bed Mobility: Supine  to Sit, Sit to Supine ?  ?  ?Supine to sit: Supervision ?Sit to supine: Supervision ?  ?  ?  ? ?Transfers ?Overall transfer level: Needs assistance ?Equipment used: None ?Transfers: Sit to/from Stand ?Sit to Stand: Supervision ?  ?  ?  ?  ?  ?  ?  ? ?Ambulation/Gait ?Ambulation/Gait assistance: Supervision ?Gait Distance (Feet): 200 Feet ?Assistive device: None ?Gait Pattern/deviations: Step-through pattern, Decreased stride length ?Gait velocity: decreased ?  ?  ?General Gait Details: hesitant gait pattern. Difficulty wayfinding without cueing. Unable to follow 2 step directions to return to room. Unable to find button on wall to open doors of unit without max cues ? ? ?Stairs ?Stairs: Yes ?Stairs assistance: Supervision ?Stair Management: One rail Right, Alternating pattern, Forwards ?Number of Stairs: 10 ?  ? ? ?Wheelchair Mobility ?  ? ?Modified Rankin (Stroke Patients Only) ?  ? ? ?  ?Balance Overall balance assessment: Mild deficits observed, not formally tested ?  ?  ?  ?  ?  ?  ?  ?  ?  ?  ?  ?  ?  ?  ?  ?  ?  ?  ?  ? ?  ?Cognition Arousal/Alertness: Awake/alert ?Behavior During Therapy: Flat affect ?Overall Cognitive Status: Impaired/Different from baseline ?Area of Impairment: Attention, Memory, Following commands, Safety/judgement, Awareness, Problem solving ?  ?  ?  ?  ?  ?  ?  ?  ?  ?Current Attention Level: Sustained ?Memory: Decreased short-term memory ?Following Commands: Follows one step commands inconsistently, Follows one step commands with increased time ?Safety/Judgement: Decreased awareness of safety, Decreased awareness of deficits ?Awareness: Emergent ?Problem Solving: Slow processing, Difficulty sequencing, Requires verbal cues ?General Comments: Decreased insight into deficits. Unable to follow 2 step directions to return to room ?  ?  ? ?  ?  Exercises   ? ?  ?General Comments   ?  ?  ? ?Pertinent Vitals/Pain Pain Assessment ?Pain Assessment: No/denies pain  ? ? ?Home Living   ?  ?  ?  ?  ?   ?  ?  ?  ?  ?   ?  ?Prior Function    ?  ?  ?   ? ?PT Goals (current goals can now be found in the care plan section) Acute Rehab PT Goals ?Patient Stated Goal: did not state ?PT Goal Formulation: With patient/family ?Time For Goal Achievement: 05/26/21 ?Potential to Achieve Goals: Good ?Progress towards PT goals: Progressing toward goals ? ?  ?Frequency ? ? ? Min 3X/week ? ? ? ?  ?PT Plan Current plan remains appropriate  ? ? ?Co-evaluation   ?  ?  ?  ?  ? ?  ?AM-PAC PT "6 Clicks" Mobility   ?Outcome Measure ? Help needed turning from your back to your side while in a flat bed without using bedrails?: None ?Help needed moving from lying on your back to sitting on the side of a flat bed without using bedrails?: None ?Help needed moving to and from a bed to a chair (including a wheelchair)?: A Little ?Help needed standing up from a chair using your arms (e.g., wheelchair or bedside chair)?: A Little ?Help needed to walk in hospital room?: A Little ?Help needed climbing 3-5 steps with a railing? : A Little ?6 Click Score: 20 ? ?  ?End of Session Equipment Utilized During Treatment: Gait belt ?Activity Tolerance: Patient tolerated treatment well ?Patient left: in bed;with call bell/phone within reach;with family/visitor present ?Nurse Communication: Mobility status ?PT Visit Diagnosis: Muscle weakness (generalized) (M62.81);Unsteadiness on feet (R26.81) ?  ? ? ?Time: 7628-3151 ?PT Time Calculation (min) (ACUTE ONLY): 16 min ? ?Charges:  $Gait Training: 8-22 mins          ?          ? ?Jill Jefferson A. Jill Jefferson, PT, DPT ?Acute Rehabilitation Services ?Pager [phone] ?Office [phone] ? ? ? ?Jill Jefferson A Jill Jefferson ?05/13/2021, 5:12 PM ? ?

## 2021-05-13 NOTE — Progress Notes (Signed)
AVS discussed with patient and her daughter. I educated her on her new medications as well as her upcoming appointments and referrals. IV had already been removed on dayshift, her belongings were packed, and this RN escorted the patient downstairs in a wheelchair to leave via private vehicle home with her daughter. ?

## 2021-05-13 NOTE — Plan of Care (Signed)
?  Problem: Coping: ?Goal: Ability to adjust to condition or change in health will improve ?Outcome: Progressing ?  ?Problem: Coping: ?Goal: Ability to identify appropriate support needs will improve ?Outcome: Progressing ?  ?Problem: Health Behavior/Discharge Planning: ?Goal: Compliance with prescribed medication regimen will improve ?Outcome: Progressing ?  ?

## 2021-05-13 NOTE — Consult Note (Signed)
Renal Service ?Consult Note ?[facility] ? ?Jill Jefferson ?05/13/2021 ?Sol Blazing, MD ?Requesting Physician: Dr. Loleta Books ? ?Reason for Consult: Renal failure ?HPI: The patient is a 61 y.o. year-old w/ hx of HTN, DM2 on insulin and seizure d/o on Keppra who presented 5/03 w/ confusion and other stroke-like symptoms. Code stroke called in the ED. EEG showed seizures and she was loaded w/ IV seizure meds and admitted. HTN was uncontrolled as well on admission around 200/ 105 range. Since admission her neuro symptoms have improved significantly. Creat here 2.8 >> 3.1 (b/l creat 2.1- 2.8). Asked to see for renal failure.  ? ?Pt seen in room, dtr present and a 2nd dtr on the phone. Pt has hx of IDDM > 10 yrs. Long hx of seizures.  She does not have a kidney doctor. She moved to [facility] from [place] about 5 yrs ago and sees her PCP on a regular basis.  ? ?Pt denies any voiding issues, heavy nsaids, change in urine color, no LE edema or SOB.  ? ? ?ROS - denies CP, no joint pain, no HA, no blurry vision, no rash, no diarrhea, no nausea/ vomiting, no dysuria, no difficulty voiding ? ? ?Past Medical History  ?Past Medical History:  ?Diagnosis Date  ? Diabetes ([place])   ? Edema   ? H. pylori infection 06/2019  ? H. pylori infection 06/2019  ? Hypertension   ? Proteinuria 11/2018  ? Seizures ([place])   ? ?Past Surgical History History reviewed. No pertinent surgical history. ?Family History  ?Family History  ?Problem Relation Age of Onset  ? Diabetes Mother   ? Hypertension Mother   ? ?Social History  reports that she has never smoked. She has never used smokeless tobacco. She reports that she does not drink alcohol and does not use drugs. ?Allergies No Known Allergies ?Home medications ?Prior to Admission medications   ?Medication Sig Start Date End Date Taking? Authorizing Provider  ?amLODipine (NORVASC) 10 MG tablet 1 PO QD ?Patient taking differently: Take 10 mg by mouth daily. 03/27/21  Yes   ?divalproex (DEPAKOTE ER)  250 MG 24 hr tablet take 1 tablet by mouth three times a day ?Patient taking differently: Take 250 mg by mouth 3 (three) times daily. 03/16/21 03/17/22 Yes   ?atorvastatin (LIPITOR) 40 MG tablet Take 1 tablet (40 mg total) by mouth daily. ?Patient not taking: Reported on 05/11/2021 09/07/20 10/07/20  Caren Griffins, MD  ?insulin aspart (NOVOLOG) 100 UNIT/ML injection Inject 5 Units into the skin 3 (three) times daily with meals. ?Patient not taking: Reported on 05/11/2021 09/07/20   Caren Griffins, MD  ?insulin glargine (LANTUS) 100 UNIT/ML injection Inject 0.1 mLs (10 Units total) into the skin at bedtime. ?Patient not taking: Reported on 05/11/2021 09/07/20   Caren Griffins, MD  ?Insulin Syringe-Needle U-100 30G X 5/16" 0.3 ML MISC use one syringe as directed up to 4 times daily 09/07/20   Caren Griffins, MD  ?levETIRAcetam (KEPPRA) 500 MG tablet Take 1 tablet (500 mg total) by mouth 2 (two) times daily. ?Patient not taking: Reported on 05/11/2021 09/07/20 05/11/21  Caren Griffins, MD  ? ? ? ?Vitals:  ? 05/12/21 2017 05/12/21 2351 05/13/21 0500 05/13/21 1208  ?BP: 124/71 (!) 152/81 (!) 145/73 138/76  ?Pulse:    85  ?Resp:  17  17  ?Temp: 99.3 ?F (37.4 ?C) 98.4 ?F (36.9 ?C) 97.7 ?F (36.5 ?C) (!) 97.5 ?F (36.4 ?C)  ?TempSrc: Oral Oral Oral Oral  ?  SpO2:    97%  ?Weight:   61.9 kg   ? ?Exam ?Gen alert, no distress ?No rash, cyanosis or gangrene ?Sclera anicteric, throat clear  ?No jvd or bruits ?Chest clear bilat to bases, no rales/ wheezing ?RRR no MRG ?Abd soft ntnd no bruits no mass or ascites +bs ?GU defer ?MS no joint effusions or deformity ?Ext no LE or UE edema, no wounds or ulcers ?Neuro is alert, Ox 3 , nf ?  ? ? Home meds include - norvasc, depakote, lipitor, insulin aspart+ lantus, keppra, prns/ vits/ supps ? ?   UA 5/4 - clear , 100 prot, rare bact, 0-5 rbc/ wbc ?   Renal US - pending ?   ?  ECHO 08/2020 - LVEF 60-65%, G1DD, mild LVH ?    ?   Na 1367  K 4.3  CO2 24  BUN 48  CR 3.15   Ca 8.5  AG 6  Alb 3.0  Mg  2.0 ?    LFT's okay  eGFR 16   WBC 6K HB 12 ? ?    Date   Creat  eGFR ?    Jan 2020  1.64  40 ?    June 2021  1.65  39, stage IIIb ?    Aug 2022  1.87- 2.42 19- 30, stage IV ?    05/10/21  2.86  18 ?    5/04   2.67 ?    5/05   2.99 ?    05/13/21  3.15  16 ? ? ? 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 Creat here is 2.8- 3.1 w/o any nephrotoxic medications.  She is a long-term diabetic on insulin and has long hx of HTN as well. UA is unremarkable. No signs of nephrotic syndrome or myeloma. She has significant renal failure advancing over the last few years. Suspect CKD due to HTN and IDDM. Will send off serologies and myeloma labs. Our office will call the pt this week to set up a f/u appt in 3-4 wks.  ?Seizure - acute on chronic, resolved ?HTN - cont meds ?DM2 - on insulin ?  ? ? ? ?Kelly Splinter  MD ?05/13/2021, 4:25 PM ?Recent Labs  ?Lab 05/10/21 ?1747 05/10/21 ?1752 05/11/21 ?0430 05/12/21 ?0160 05/13/21 ?0121  ?HGB 12.7 13.6 12.4  --   --   ?ALBUMIN 3.4*  --  3.0*  --   --   ?CALCIUM 9.1  --  9.1 8.8* 8.5*  ?CREATININE 2.86* 2.90* 2.67* 2.99* 3.15*  ?K 4.6 4.5 4.4 4.1 4.3  ? ? ?

## 2021-05-15 LAB — KAPPA/LAMBDA LIGHT CHAINS
Kappa free light chain: 82.1 mg/L — ABNORMAL HIGH (ref 3.3–19.4)
Kappa, lambda light chain ratio: 1.84 — ABNORMAL HIGH (ref 0.26–1.65)
Lambda free light chains: 44.7 mg/L — ABNORMAL HIGH (ref 5.7–26.3)

## 2021-05-16 LAB — ANTINUCLEAR ANTIBODIES, IFA: ANA Ab, IFA: NEGATIVE

## 2021-05-30 DIAGNOSIS — R569 Unspecified convulsions: Secondary | ICD-10-CM | POA: Diagnosis not present

## 2021-05-30 DIAGNOSIS — E7849 Other hyperlipidemia: Secondary | ICD-10-CM | POA: Diagnosis not present

## 2021-05-30 DIAGNOSIS — E1149 Type 2 diabetes mellitus with other diabetic neurological complication: Secondary | ICD-10-CM | POA: Diagnosis not present

## 2021-05-30 DIAGNOSIS — I10 Essential (primary) hypertension: Secondary | ICD-10-CM | POA: Diagnosis not present

## 2021-06-08 ENCOUNTER — Other Ambulatory Visit: Payer: Self-pay | Admitting: Internal Medicine

## 2021-06-08 ENCOUNTER — Other Ambulatory Visit (HOSPITAL_BASED_OUTPATIENT_CLINIC_OR_DEPARTMENT_OTHER): Payer: Self-pay

## 2021-06-08 DIAGNOSIS — N184 Chronic kidney disease, stage 4 (severe): Secondary | ICD-10-CM | POA: Diagnosis not present

## 2021-06-08 DIAGNOSIS — R569 Unspecified convulsions: Secondary | ICD-10-CM | POA: Diagnosis not present

## 2021-06-08 DIAGNOSIS — E559 Vitamin D deficiency, unspecified: Secondary | ICD-10-CM | POA: Diagnosis not present

## 2021-06-08 DIAGNOSIS — I10 Essential (primary) hypertension: Secondary | ICD-10-CM | POA: Diagnosis not present

## 2021-06-08 DIAGNOSIS — E7849 Other hyperlipidemia: Secondary | ICD-10-CM | POA: Diagnosis not present

## 2021-06-08 DIAGNOSIS — E1149 Type 2 diabetes mellitus with other diabetic neurological complication: Secondary | ICD-10-CM | POA: Diagnosis not present

## 2021-06-08 MED ORDER — TRESIBA FLEXTOUCH 100 UNIT/ML ~~LOC~~ SOPN
10.0000 [IU] | PEN_INJECTOR | Freq: Every evening | SUBCUTANEOUS | 2 refills | Status: DC
  Filled 2021-06-08 – 2021-12-13 (×4): qty 3, 30d supply, fill #0

## 2021-06-08 MED ORDER — DIVALPROEX SODIUM ER 250 MG PO TB24
ORAL_TABLET | ORAL | 5 refills | Status: DC
Start: 2021-06-08 — End: 2023-04-02

## 2021-06-08 MED ORDER — BD PEN NEEDLE NANO U/F 32G X 4 MM MISC: 5 refills | Status: AC

## 2021-06-09 ENCOUNTER — Other Ambulatory Visit (HOSPITAL_BASED_OUTPATIENT_CLINIC_OR_DEPARTMENT_OTHER): Payer: Self-pay

## 2021-06-09 LAB — COMPLETE METABOLIC PANEL WITH GFR
AG Ratio: 1.4 (calc) (ref 1.0–2.5)
ALT: 9 U/L (ref 6–29)
AST: 15 U/L (ref 10–35)
Albumin: 3.8 g/dL (ref 3.6–5.1)
Alkaline phosphatase (APISO): 47 U/L (ref 37–153)
BUN/Creatinine Ratio: 24 (calc) — ABNORMAL HIGH (ref 6–22)
BUN: 83 mg/dL — ABNORMAL HIGH (ref 7–25)
CO2: 23 mmol/L (ref 20–32)
Calcium: 9.3 mg/dL (ref 8.6–10.4)
Chloride: 100 mmol/L (ref 98–110)
Creat: 3.48 mg/dL — ABNORMAL HIGH (ref 0.50–1.03)
Globulin: 2.8 g/dL (calc) (ref 1.9–3.7)
Glucose, Bld: 86 mg/dL (ref 65–99)
Potassium: 4 mmol/L (ref 3.5–5.3)
Sodium: 140 mmol/L (ref 135–146)
Total Bilirubin: 0.4 mg/dL (ref 0.2–1.2)
Total Protein: 6.6 g/dL (ref 6.1–8.1)
eGFR: 15 mL/min/{1.73_m2} — ABNORMAL LOW (ref >=60)

## 2021-06-09 LAB — LIPID PANEL
Cholesterol: 383 mg/dL — ABNORMAL HIGH (ref <200)
HDL: 44 mg/dL — ABNORMAL LOW (ref >=50)
LDL Cholesterol (Calc): 283 mg/dL (calc) — ABNORMAL HIGH
Non-HDL Cholesterol (Calc): 339 mg/dL (calc) — ABNORMAL HIGH (ref <130)
Total CHOL/HDL Ratio: 8.7 (calc) — ABNORMAL HIGH (ref <5.0)
Triglycerides: 328 mg/dL — ABNORMAL HIGH (ref <150)

## 2021-06-09 LAB — TSH: TSH: 1.67 mIU/L (ref 0.40–4.50)

## 2021-06-09 LAB — VITAMIN D 25 HYDROXY (VIT D DEFICIENCY, FRACTURES): Vit D, 25-Hydroxy: 50 ng/mL (ref 30–100)

## 2021-06-09 LAB — VALPROIC ACID LEVEL: Valproic Acid Lvl: 80.1 mg/L (ref 50.0–100.0)

## 2021-06-26 DIAGNOSIS — N184 Chronic kidney disease, stage 4 (severe): Secondary | ICD-10-CM | POA: Diagnosis not present

## 2021-06-26 DIAGNOSIS — R569 Unspecified convulsions: Secondary | ICD-10-CM | POA: Diagnosis not present

## 2021-06-26 DIAGNOSIS — I10 Essential (primary) hypertension: Secondary | ICD-10-CM | POA: Diagnosis not present

## 2021-06-26 DIAGNOSIS — E1149 Type 2 diabetes mellitus with other diabetic neurological complication: Secondary | ICD-10-CM | POA: Diagnosis not present

## 2021-06-30 DIAGNOSIS — G40909 Epilepsy, unspecified, not intractable, without status epilepticus: Secondary | ICD-10-CM | POA: Diagnosis not present

## 2021-06-30 DIAGNOSIS — I129 Hypertensive chronic kidney disease with stage 1 through stage 4 chronic kidney disease, or unspecified chronic kidney disease: Secondary | ICD-10-CM | POA: Diagnosis not present

## 2021-06-30 DIAGNOSIS — N184 Chronic kidney disease, stage 4 (severe): Secondary | ICD-10-CM | POA: Diagnosis not present

## 2021-06-30 DIAGNOSIS — E1122 Type 2 diabetes mellitus with diabetic chronic kidney disease: Secondary | ICD-10-CM | POA: Diagnosis not present

## 2021-08-08 DIAGNOSIS — Z1211 Encounter for screening for malignant neoplasm of colon: Secondary | ICD-10-CM | POA: Diagnosis not present

## 2021-08-08 DIAGNOSIS — N184 Chronic kidney disease, stage 4 (severe): Secondary | ICD-10-CM | POA: Diagnosis not present

## 2021-08-08 DIAGNOSIS — R569 Unspecified convulsions: Secondary | ICD-10-CM | POA: Diagnosis not present

## 2021-08-08 DIAGNOSIS — E1122 Type 2 diabetes mellitus with diabetic chronic kidney disease: Secondary | ICD-10-CM | POA: Diagnosis not present

## 2021-12-06 ENCOUNTER — Other Ambulatory Visit: Payer: Self-pay

## 2021-12-11 ENCOUNTER — Other Ambulatory Visit: Payer: Self-pay

## 2021-12-11 MED ORDER — TRESIBA FLEXTOUCH 100 UNIT/ML ~~LOC~~ SOPN: PEN_INJECTOR | SUBCUTANEOUS | 5 refills | Status: DC

## 2021-12-12 ENCOUNTER — Other Ambulatory Visit: Payer: Self-pay

## 2021-12-13 ENCOUNTER — Other Ambulatory Visit: Payer: Self-pay

## 2021-12-18 ENCOUNTER — Other Ambulatory Visit: Payer: Self-pay

## 2022-05-30 ENCOUNTER — Encounter: Payer: Self-pay | Admitting: Neurology

## 2022-05-30 ENCOUNTER — Ambulatory Visit: Payer: Medicaid Other | Admitting: Neurology

## 2023-01-01 ENCOUNTER — Emergency Department (HOSPITAL_COMMUNITY)
Admission: EM | Admit: 2023-01-01 | Discharge: 2023-01-01 | Disposition: A | Discharge status: Home / Self Care | Payer: Medicaid Other | Attending: Emergency Medicine | Admitting: Emergency Medicine

## 2023-01-01 ENCOUNTER — Encounter (HOSPITAL_COMMUNITY): Payer: Self-pay

## 2023-01-01 ENCOUNTER — Emergency Department (HOSPITAL_COMMUNITY): Payer: Medicaid Other

## 2023-01-01 ENCOUNTER — Other Ambulatory Visit: Payer: Self-pay

## 2023-01-01 DIAGNOSIS — I10 Essential (primary) hypertension: Secondary | ICD-10-CM | POA: Insufficient documentation

## 2023-01-01 DIAGNOSIS — R42 Dizziness and giddiness: Secondary | ICD-10-CM | POA: Diagnosis not present

## 2023-01-01 DIAGNOSIS — R55 Syncope and collapse: Secondary | ICD-10-CM | POA: Insufficient documentation

## 2023-01-01 DIAGNOSIS — Z794 Long term (current) use of insulin: Secondary | ICD-10-CM | POA: Insufficient documentation

## 2023-01-01 DIAGNOSIS — Z79899 Other long term (current) drug therapy: Secondary | ICD-10-CM | POA: Insufficient documentation

## 2023-01-01 DIAGNOSIS — E162 Hypoglycemia, unspecified: Secondary | ICD-10-CM

## 2023-01-01 DIAGNOSIS — Z7982 Long term (current) use of aspirin: Secondary | ICD-10-CM | POA: Insufficient documentation

## 2023-01-01 DIAGNOSIS — W19XXXA Unspecified fall, initial encounter: Secondary | ICD-10-CM | POA: Diagnosis not present

## 2023-01-01 DIAGNOSIS — E11649 Type 2 diabetes mellitus with hypoglycemia without coma: Secondary | ICD-10-CM | POA: Diagnosis present

## 2023-01-01 LAB — CBC
HCT: 40.3 % (ref 36.0–46.0)
Hemoglobin: 12.3 g/dL (ref 12.0–15.0)
MCH: 26.9 pg (ref 26.0–34.0)
MCHC: 30.5 g/dL (ref 30.0–36.0)
MCV: 88 fL (ref 80.0–100.0)
Platelets: 205 10*3/uL (ref 150–400)
RBC: 4.58 MIL/uL (ref 3.87–5.11)
RDW: 12.4 % (ref 11.5–15.5)
WBC: 8.8 10*3/uL (ref 4.0–10.5)
nRBC: 0 % (ref 0.0–0.2)

## 2023-01-01 LAB — URINALYSIS, ROUTINE W REFLEX MICROSCOPIC
Bilirubin Urine: NEGATIVE
Glucose, UA: 50 mg/dL — AB
Ketones, ur: NEGATIVE mg/dL
Leukocytes,Ua: NEGATIVE
Nitrite: NEGATIVE
Protein, ur: >=300 mg/dL — AB
Specific Gravity, Urine: 1.01 (ref 1.005–1.030)
pH: 7 (ref 5.0–8.0)

## 2023-01-01 LAB — CBG MONITORING, ED
Glucose-Capillary: 166 mg/dL — ABNORMAL HIGH (ref 70–99)
Glucose-Capillary: 45 mg/dL — ABNORMAL LOW (ref 70–99)
Glucose-Capillary: 146 mg/dL — ABNORMAL HIGH (ref 70–99)
Glucose-Capillary: 131 mg/dL — ABNORMAL HIGH (ref 70–99)
Glucose-Capillary: 105 mg/dL — ABNORMAL HIGH (ref 70–99)
Glucose-Capillary: 152 mg/dL — ABNORMAL HIGH (ref 70–99)

## 2023-01-01 LAB — COMPREHENSIVE METABOLIC PANEL
ALT: 17 U/L (ref 0–44)
AST: 25 U/L (ref 15–41)
Albumin: 3.2 g/dL — ABNORMAL LOW (ref 3.5–5.0)
Alkaline Phosphatase: 107 U/L (ref 38–126)
Anion gap: 9 (ref 5–15)
BUN: 46 mg/dL — ABNORMAL HIGH (ref 6–20)
CO2: 24 mmol/L (ref 22–32)
Calcium: 8.7 mg/dL — ABNORMAL LOW (ref 8.9–10.3)
Chloride: 111 mmol/L (ref 98–111)
Creatinine, Ser: 4.17 mg/dL — ABNORMAL HIGH (ref 0.44–1.00)
GFR, Estimated: 12 mL/min — ABNORMAL LOW (ref >60)
Glucose, Bld: 57 mg/dL — ABNORMAL LOW (ref 70–99)
Potassium: 4.1 mmol/L (ref 3.5–5.1)
Sodium: 144 mmol/L (ref 135–145)
Total Bilirubin: 0.7 mg/dL (ref <1.2)
Total Protein: 6.8 g/dL (ref 6.5–8.1)

## 2023-01-01 LAB — CK: Total CK: 206 U/L (ref 38–234)

## 2023-01-01 MED ORDER — DEXTROSE 50 % IV SOLN
1.0000 | Freq: Once | INTRAVENOUS | Status: AC
Start: 2023-01-01 — End: 2023-01-01
  Administered 2023-01-01: 50 mL via INTRAVENOUS
  Filled 2023-01-01: qty 50

## 2023-01-01 MED ORDER — AMLODIPINE BESYLATE 5 MG PO TABS: 5.0000 mg | ORAL_TABLET | Freq: Every day | ORAL | 0 refills | Status: DC

## 2023-01-01 MED ORDER — AMLODIPINE BESYLATE 5 MG PO TABS
5.0000 mg | ORAL_TABLET | Freq: Once | ORAL | Status: AC
  Administered 2023-01-01: 5 mg via ORAL
  Filled 2023-01-01: qty 1

## 2023-01-01 NOTE — ED Provider Notes (Signed)
[facility] EMERGENCY DEPARTMENT AT [facility] Provider Note   CSN: [id number] Arrival date & time: 01/01/23  1546     History  Chief Complaint  Patient presents with   Fall    Jill Jefferson is a 62 y.o. female with PMH as listed below who presents with fall, hypoglycemia. Patient states that she got up this morning around 10 am and felt very dizzy when she stood up. She associates this feeling with hypoglycemia and normally she keep some sort of syrup or ginger ale by her bedside for when this happens but did not have any.  She fell onto the ground and is unsure if she lost consciousness.  She states that she could not yell out for her daughter to come help her and she laid on the ground until approximately 2:30 PM when her daughter came and found her.  She states she was "in and out" during this time.  She took her [name] insulin last night but did not eat dinner, she states because her daughter and her had [place] food for lunch and she was very full yesterday. Patient also takes ozempic and metformin. Did not lose bladder/bowel or bite her tongue, doesn't think she had a seizure. Blood sugar with EMS was 52 mg/dL, and she was given 1 mg glucagon and 15 g oral glucose, her GCS improved to 15. Doesn't take blood thinners. On arrival to ED her BG was 45 mg/dL, she was given 1 amp W09. Subsequently 166 mg/dL.    Past Medical History:  Diagnosis Date   Diabetes ([facility])    Edema    H. pylori infection 06/2019   H. pylori infection 06/2019   Hypertension    Proteinuria 11/2018   Seizures ([facility])        Home Medications Prior to Admission medications   Medication Sig Start Date End Date Taking? Authorizing Provider  amLODipine (NORVASC) 5 MG tablet Take 1 tablet (5 mg total) by mouth daily. 01/01/23  Yes Loetta Rough, MD  aspirin EC 81 MG tablet Take 81 mg by mouth daily. Swallow whole.   Yes [provider]  divalproex (DEPAKOTE ER) 250 MG 24 hr tablet 2 po  qhs Patient taking differently: Take 250 mg by mouth at bedtime. 06/08/21  Yes   insulin degludec (TRESIBA FLEXTOUCH) 100 UNIT/ML FlexTouch Pen inject 10 units every evening Patient taking differently: Inject 10-14 Units into the skin every evening. Inject 10-14 units with evening meal depending on blood sugar. 12/11/21  Yes   levETIRAcetam (KEPPRA) 500 MG tablet Take 1 tablet (500 mg total) by mouth 2 (two) times daily. 05/13/21  Yes Danford, Earl Lites, MD  divalproex (DEPAKOTE ER) 250 MG 24 hr tablet Take 3 tablets (750 mg total) by mouth daily. 05/13/21 05/14/22  DanfordEarl Lites, MD  Insulin Pen Needle (BD PEN NEEDLE NANO U/F) 32G X 4 MM MISC for insulin injections qdDx: E11.65 on insulin 06/08/21         Allergies    Patient has no known allergies.    Review of Systems   Review of Systems A 10 point review of systems was performed and is negative unless otherwise reported in HPI.  Physical Exam Updated Vital Signs BP (!) 193/79   Pulse 81   Temp 98.5 F (36.9 C) (Oral)   Resp 16   Ht 5' (1.524 m)   Wt 63 kg   SpO2 100%   BMI 27.15 kg/m  Physical Exam General: Normal appearing female,  lying in bed.  HEENT: PERRLA, Sclera anicteric, MMM, trachea midline. NCAT. No Midline C-spine TTP, deformities, or stepoffs.  Cardiology: RRR, no murmurs/rubs/gallops. BL radial and DP pulses equal bilaterally. No TTP or deformities of chest wall.  Resp: Normal respiratory rate and effort. CTAB, no wheezes, rhonchi, crackles.  Abd: Soft, non-tender, non-distended. No rebound tenderness or guarding.  Pelvis: Pelvis stable nontender. MSK: No peripheral edema or signs of trauma. Extremities without deformity or TTP. Skin: warm, dry Back: No CVA tenderness Neuro: A&Ox4, CNs II-XII grossly intact. MAEs. Sensation grossly intact.  Psych: Normal mood and affect.   ED Results / Procedures / Treatments   Labs (all labs ordered are listed, but only abnormal results are displayed) Labs Reviewed   URINALYSIS, ROUTINE W REFLEX MICROSCOPIC - Abnormal; Notable for the following components:      Result Value   Color, Urine STRAW (*)    Glucose, UA 50 (*)    Hgb urine dipstick SMALL (*)    Protein, ur >=300 (*)    Bacteria, UA RARE (*)    All other components within normal limits  COMPREHENSIVE METABOLIC PANEL - Abnormal; Notable for the following components:   Glucose, Bld 57 (*)    BUN 46 (*)    Creatinine, Ser 4.17 (*)    Calcium 8.7 (*)    Albumin 3.2 (*)    GFR, Estimated 12 (*)    All other components within normal limits  CBG MONITORING, ED - Abnormal; Notable for the following components:   Glucose-Capillary 45 (*)    All other components within normal limits  CBG MONITORING, ED - Abnormal; Notable for the following components:   Glucose-Capillary 166 (*)    All other components within normal limits  CBG MONITORING, ED - Abnormal; Notable for the following components:   Glucose-Capillary 146 (*)    All other components within normal limits  CBG MONITORING, ED - Abnormal; Notable for the following components:   Glucose-Capillary 131 (*)    All other components within normal limits  CBG MONITORING, ED - Abnormal; Notable for the following components:   Glucose-Capillary 105 (*)    All other components within normal limits  CBG MONITORING, ED - Abnormal; Notable for the following components:   Glucose-Capillary 152 (*)    All other components within normal limits  CBC  CK    EKG EKG Interpretation Date/Time:  Tuesday January 01 2023 15:59:04 EST Ventricular Rate:  84 PR Interval:  167 QRS Duration:  96 QT Interval:  445 QTC Calculation: 527 R Axis:   79  Text Interpretation: Sinus rhythm Artifact Confirmed by Gerhard Munch [phone] on 01/02/2023 11:37:11 PM  Radiology No results found.  Procedures Procedures    Medications Ordered in ED Medications  dextrose 50 % solution 50 mL (50 mLs Intravenous Given 01/01/23 1559)  amLODipine (NORVASC) tablet  5 mg (5 mg Oral Given 01/01/23 2039)    ED Course/ Medical Decision Making/ A&P                          Medical Decision Making Amount and/or Complexity of Data Reviewed Labs: ordered. Decision-making details documented in ED Course. Radiology: ordered.  Risk Prescription drug management.    This patient presents to the ED for concern of fall, possible LOC ,hypoglycemia, this involves an extensive number of treatment options, and is a complaint that carries with it a high risk of complications and morbidity.  I considered the following differential  and admission for this acute, potentially life threatening condition.   MDM:    DDX for syncope includes but is not limited to:  Patient's history most concerning with hypoglycemia. She improved with administration of glucose with EMS and in the ED. This is likely due to taking her long-acting insulin and not eating well. She is monitored in the ED with no recurrent symptoms stating she feels much improved. Her BP is also noted to be significantly elevated in the ED, up to 218/93, and so must also consider hypertensive emergency as possible cause of syncope. However her symptoms improved with glucose and so it is likely hypoglycemia instead. She has no CP/SOB to indicate ACS and EKG without signs of ischemia. She has no anemia or electrolyte abnormalities. She had no palpitations and had no arrhythmia while monitored on telemetry for 6 hours in the ED. She has no focal neuro deficits, no stroke-like symptoms, no loss of bladder/bowel to indicate hemorrhage vs CVA vs seizure. Considered PE or aortic dissection as well but also w/ no tachypnea/hypoxia/tachycardia/chest pain. She is found to have an AKI likely representing her poor PO intake. No rhabdomyolysis.   Clinical Course as of 01/07/23 2154  Tue Jan 01, 2023  1713 CBC wnl [HN]  1713 Urinalysis, Routine w reflex microscopic -Urine, Clean Catch(!) Proteinuria and glucosuria,  hemoglobinuria, no UTI or ketonuria [HN]  1743 Creatinine(!): 4.17 +AKI on CKD [HN]  1743 Glucose(!): 57 Will start D10 drip most likely. RN rechecking POC glucose now. [HN]  1748 CK Total: 206 No rhabdo [HN]  1759 Glucose-Capillary(!): 146 [HN]  1909 Glucose-Capillary(!): 131 [HN]  1911 Patient took her long-acting insulin approx 24 hours ago. Instructed to hold her insulin for tonight and to check her sugar at home [HN]  2032 Patient eating now, feeling much better. She states that awhile ago she was taken off her antihypertensives due to volatile and low blood pressures. [HN]  2133 Glucose-Capillary(!): 152 [HN]  2133 BP 192/72, improving. Will DC w/ amlodipine rx and f/u with PCP for hypoglycemia/hypertension. [HN]    Clinical Course User Index [HN] Loetta Rough, MD    Labs: I Ordered, and personally interpreted labs.  The pertinent results include:  those listed above  Imaging Studies ordered: I ordered imaging studies including CTH I independently visualized and interpreted imaging. I agree with the radiologist interpretation  Additional history obtained from chart review, EMS.    Cardiac Monitoring: The patient was maintained on a cardiac monitor.  I personally viewed and interpreted the cardiac monitored which showed an underlying rhythm of: NSr  Reevaluation: After the interventions noted above, I reevaluated the patient and found that they have :improved  Social Determinants of Health: Lives independently  Disposition:  DC w/ discharge instructions/return precautions. All questions answered to patient's satisfaction.    Co morbidities that complicate the patient evaluation  Past Medical History:  Diagnosis Date   Diabetes ([facility])    Edema    H. pylori infection 06/2019   H. pylori infection 06/2019   Hypertension    Proteinuria 11/2018   Seizures ([facility])      Medicines Meds ordered this encounter  Medications   dextrose 50 % solution 50 mL   amLODipine  (NORVASC) tablet 5 mg   amLODipine (NORVASC) 5 MG tablet    Sig: Take 1 tablet (5 mg total) by mouth daily.    Dispense:  30 tablet    Refill:  0    I have reviewed the patients home  medicines and have made adjustments as needed  Problem List / ED Course: Problem List Items Addressed This Visit   None Visit Diagnoses       Hypoglycemia    -  Primary     Syncope and collapse         Asymptomatic hypertension       Relevant Medications   aspirin EC 81 MG tablet   amLODipine (NORVASC) tablet 5 mg (Completed)   amLODipine (NORVASC) 5 MG tablet                   This note was created using dictation software, which may contain spelling or grammatical errors.    Loetta Rough, MD 01/07/23 [phone]

## 2023-01-01 NOTE — Discharge Instructions (Signed)
Thank you for coming to [facility] Emergency Department. You were seen for passing out with low blood sugar. We did an exam, labs, and imaging, and these showed low blood sugar, probably from taking your long-acting insulin without eating very well yesterday.  This improved with sugar given through your IV as well as eating.  You were observed in the emergency department for 5 hours without recurrence of your blood sugar being low.  Please check your blood sugar tonight and as long as it is greater than 200 you can take your insulin as prescribed.  Please check your insulin again in the morning and follow-up with your primary doctor about your insulin.  While in the ED you were also found to have significantly elevated blood pressure.  We we will restart your amlodipine 5 mg each day for your blood pressure.  Please follow-up with your primary care physician for your blood pressure.  Please take your blood pressure once per day each day in the afternoon after sitting calmly for 10 to 15 minutes.   Please follow up with your primary care provider within 1 week.   Do not hesitate to return to the ED or call 911 if you experience: -Worsening symptoms -Chest pain, shortness of breath -Strokelike symptoms -Lightheadedness, passing out -Fevers/chills -Anything else that concerns you

## 2023-01-01 NOTE — ED Triage Notes (Signed)
Pt BIB EMS from home. Pt fell 10am out of bed this morning and laid on floor until 2:30pm. Daughter found pt unconscious on the floor. BS 52. Hx of diabetes. 1mg  of glucagon. 15g of oral glucose, gcs 15. Denies blood thinners. HTN.

## 2023-01-01 NOTE — ED Notes (Signed)
Patient was given a dinner tray to eat. BP is elevated. Patient states she does not take a BP medication.

## 2023-02-15 ENCOUNTER — Other Ambulatory Visit: Payer: Self-pay

## 2023-02-15 ENCOUNTER — Ambulatory Visit
Admission: EM | Admit: 2023-02-15 | Discharge: 2023-02-15 | Disposition: A | Discharge status: Home / Self Care | Payer: Medicaid Other | Attending: Family Medicine | Admitting: Family Medicine

## 2023-02-15 DIAGNOSIS — R051 Acute cough: Secondary | ICD-10-CM | POA: Diagnosis not present

## 2023-02-15 DIAGNOSIS — Z760 Encounter for issue of repeat prescription: Secondary | ICD-10-CM | POA: Diagnosis not present

## 2023-02-15 MED ORDER — AMLODIPINE BESYLATE 5 MG PO TABS: 5.0000 mg | ORAL_TABLET | Freq: Every day | ORAL | 0 refills | Status: DC

## 2023-02-15 NOTE — ED Triage Notes (Addendum)
 Pt presents with complaints of dry, productive cough x 2 weeks. Pt would also like a medication refill on seizure medication, 500 mg Keppra . Pt is also needing a medication refill on BP medication, amlodipine  5 mg, has not taken in two weeks. Pt is in the process of finding a primary care. Pt currently denies pain. Robitussin taken OTC for cough with no relief.

## 2023-02-15 NOTE — Discharge Instructions (Addendum)
 You need to establish with a primary care doctor as soon as possible for treatment of your chronic conditions Go to the emergency department for worsening symptoms or development of new symptoms such as chest pain, shortness of breath Over-the-counter Delsym as directed on the package for cough

## 2023-02-15 NOTE — ED Provider Notes (Signed)
 [facility]    CSN: [id number] Arrival date & time: 02/15/23  1846      History   Chief Complaint Chief Complaint  Patient presents with   Cough    HPI Jill [facility] is a 63 y.o. female.   The history is provided by the patient.  Cough Associated symptoms: no chest pain, no chills, no fever, no headaches, no shortness of breath and no wheezing   Requesting refill of her blood pressure medicine amlodipine  Denies headache, change in vision, chest pain, shortness of breath.  Admits swelling in her legs which is a chronic condition.  Has been having a cough for 2 weeks worse at night with clear sputum.  Denies fever, chills, sweats, known contacts with illness pharmacy  Past Medical History:  Diagnosis Date   Diabetes (HCC)    Edema    H. pylori infection 06/2019   H. pylori infection 06/2019   Hypertension    Proteinuria 11/2018   Seizures (HCC)     Patient Active Problem List   Diagnosis Date Noted   Hypertension    CKD (chronic kidney disease) stage 4, GFR 15-29 ml/min (HCC)    Status epilepticus (HCC) 05/10/2021   Seizure disorder (HCC)    Acute metabolic encephalopathy 09/02/2020   Hyperosmolar hyperglycemic state (HHS) (HCC) 09/01/2020   Focal neurological deficit 09/01/2020   AKI (acute kidney injury) (HCC) 09/01/2020   Hemoglobin A1c less than 7.0% 11/26/2018   Hyperglycemia 11/26/2018   Proteinuria 11/26/2018   Joint pain in fingers of both hands 08/03/2018   Bilateral lower extremity edema 05/03/2018   Uncontrolled type 2 diabetes mellitus with hyperglycemia, with long-term current use of insulin  (HCC) 01/31/2018   Age-related cataract of both eyes 01/31/2018   Elevated glucose 01/31/2018    History reviewed. No pertinent surgical history.  OB History     Gravida  5   Para      Term      Preterm      AB      Living  5      SAB      IAB      Ectopic      Multiple      Live Births               Home  Medications    Prior to Admission medications   Medication Sig Start Date End Date Taking? Authorizing Provider  amLODipine  (NORVASC ) 5 MG tablet Take 1 tablet (5 mg total) by mouth daily. 02/15/23   Aseel Truxillo, PA  aspirin  EC 81 MG tablet Take 81 mg by mouth daily. Swallow whole.    [provider]  divalproex  (DEPAKOTE  ER) 250 MG 24 hr tablet Take 3 tablets (750 mg total) by mouth daily. 05/13/21 05/14/22  Jonel Lonni SQUIBB, MD  divalproex  (DEPAKOTE  ER) 250 MG 24 hr tablet 2 po qhs Patient taking differently: Take 250 mg by mouth at bedtime. 06/08/21     insulin  degludec (TRESIBA  FLEXTOUCH) 100 UNIT/ML FlexTouch Pen inject 10 units every evening Patient taking differently: Inject 10-14 Units into the skin every evening. Inject 10-14 units with evening meal depending on blood sugar. 12/11/21     Insulin  Pen Needle (BD PEN NEEDLE NANO U/F) 32G X 4 MM MISC for insulin  injections qdDx: E11.65 on insulin  06/08/21     levETIRAcetam  (KEPPRA ) 500 MG tablet Take 1 tablet (500 mg total) by mouth 2 (two) times daily. 05/13/21   Danford, Lonni SQUIBB, MD  Family History Family History  Problem Relation Age of Onset   Diabetes Mother    Hypertension Mother     Social History Social History   Tobacco Use   Smoking status: Never   Smokeless tobacco: Never  Vaping Use   Vaping status: Never Used  Substance Use Topics   Alcohol use: Never   Drug use: Never     Allergies   Patient has no known allergies.   Review of Systems Review of Systems  Constitutional:  Negative for chills, fatigue and fever.  Respiratory:  Positive for cough. Negative for chest tightness, shortness of breath and wheezing.   Cardiovascular:  Negative for chest pain.  Gastrointestinal:  Negative for diarrhea and vomiting.  Neurological:  Negative for headaches.     Physical Exam Triage Vital Signs ED Triage Vitals  Encounter Vitals Group     BP 02/15/23 1941 (!) 210/84     Systolic BP Percentile  --      Diastolic BP Percentile --      Pulse Rate 02/15/23 1941 69     Resp 02/15/23 1941 18     Temp 02/15/23 1941 97.9 F (36.6 C)     Temp Source 02/15/23 1941 Oral     SpO2 02/15/23 1941 98 %     Weight 02/15/23 1938 138 lb (62.6 kg)     Height 02/15/23 1938 5' (1.524 m)     Head Circumference --      Peak Flow --      Pain Score 02/15/23 1938 0     Pain Loc --      Pain Education --      Exclude from Growth Chart --    No data found.  Updated Vital Signs BP (!) 210/84 (BP Location: Right Arm)   Pulse 69   Temp 97.9 F (36.6 C) (Oral)   Resp 18   Ht 5' (1.524 m)   Wt 138 lb (62.6 kg)   SpO2 98%   BMI 26.95 kg/m   Visual Acuity Right Eye Distance:   Left Eye Distance:   Bilateral Distance:    Right Eye Near:   Left Eye Near:    Bilateral Near:     Physical Exam Constitutional:      Appearance: She is not ill-appearing.  HENT:     Head: Normocephalic.     Right Ear: Tympanic membrane and ear canal normal.     Left Ear: Tympanic membrane normal.     Nose: No rhinorrhea.     Mouth/Throat:     Pharynx: Oropharynx is clear.  Eyes:     Conjunctiva/sclera: Conjunctivae normal.  Cardiovascular:     Rate and Rhythm: Normal rate and regular rhythm.     Heart sounds: Normal heart sounds.  Pulmonary:     Effort: Pulmonary effort is normal. No respiratory distress.     Breath sounds: Normal breath sounds. No wheezing or rales.  Musculoskeletal:     Right lower leg: Edema present.     Left lower leg: Edema present.  Neurological:     Mental Status: She is alert.      UC Treatments / Results  Labs (all labs ordered are listed, but only abnormal results are displayed) Labs Reviewed - No data to display  EKG   Radiology No results found.  Procedures Procedures (including critical care time)  Medications Ordered in UC Medications - No data to display  Initial Impression / Assessment and Plan / UC Course  I have reviewed the triage vital signs and  the nursing notes.  Pertinent labs & imaging results that were available during my care of the patient were reviewed by me and considered in my medical decision making (see chart for details).     63 year old with history of hypertension, chronic kidney disease, seizures presents requesting refill of her blood pressure and seizure medicines.  Also complaining of cough with clear sputum for 2 weeks without fever She denies chest pain, headache, shortness of breath.  She does have bilateral pedal edema, chart was reviewed she was prescribed amlodipine  in the emergency department 01/01/2023 given a 30-day supply I do not see any prescriptions for seizure medicines for over a year She was given a refill of the amlodipine , I put in a request for PCP assistance, she was given strict ED precautions  Final Clinical Impressions(s) / UC Diagnoses   Final diagnoses:  Acute cough  Medication refill     Discharge Instructions      You need to establish with a primary care doctor as soon as possible for treatment of your chronic conditions Go to the emergency department for worsening symptoms or development of new symptoms such as chest pain, shortness of breath Over-the-counter Delsym as directed on the package for cough   ED Prescriptions     Medication Sig Dispense Auth. Provider   amLODipine  (NORVASC ) 5 MG tablet Take 1 tablet (5 mg total) by mouth daily. 30 tablet Deland Slocumb, [place]      PDMP not reviewed this encounter.   Isaura Schiller, [place] 02/15/23 2006

## 2023-02-28 ENCOUNTER — Ambulatory Visit: Payer: Medicaid Other | Admitting: Family Medicine

## 2023-02-28 DIAGNOSIS — Z0000 Encounter for general adult medical examination without abnormal findings: Secondary | ICD-10-CM

## 2023-02-28 DIAGNOSIS — E1165 Type 2 diabetes mellitus with hyperglycemia: Secondary | ICD-10-CM

## 2023-03-14 ENCOUNTER — Ambulatory Visit: Payer: Medicaid Other | Admitting: Family Medicine

## 2023-03-16 ENCOUNTER — Ambulatory Visit
Admission: EM | Admit: 2023-03-16 | Discharge: 2023-03-16 | Disposition: A | Discharge status: Home / Self Care | Attending: Family Medicine | Admitting: Family Medicine

## 2023-03-16 ENCOUNTER — Other Ambulatory Visit: Payer: Self-pay

## 2023-03-16 DIAGNOSIS — R519 Headache, unspecified: Secondary | ICD-10-CM | POA: Diagnosis not present

## 2023-03-16 DIAGNOSIS — I160 Hypertensive urgency: Secondary | ICD-10-CM

## 2023-03-16 NOTE — ED Notes (Signed)
 Patient is being discharged from the Urgent Care and sent to the Emergency Department via private vehicle . Per Marylene Land PA, patient is in need of higher level of care due to severe headache and hypertension. Patient is aware and verbalizes understanding of plan of care.  Vitals:   03/16/23 1406 03/16/23 1428  BP: (!) 210/91 (!) 203/97  Pulse: 78   Resp: 16   Temp: 98 F (36.7 C)   SpO2: 97%

## 2023-03-16 NOTE — Discharge Instructions (Signed)
 Go to the hospital for further evaluation of your elevated blood pressure and headache

## 2023-03-16 NOTE — ED Provider Notes (Signed)
 [facility]    CSN: [id number] Arrival date & time: 03/16/23  1348      History   Chief Complaint Chief Complaint  Patient presents with   Headache   Hypertension    HPI Jill Jefferson is a 63 y.o. female.   The history is provided by the patient.  Headache Associated symptoms: dizziness, fatigue and fever   Associated symptoms: no congestion, no nausea, no sore throat and no vomiting   Hypertension Associated symptoms include headaches and shortness of breath. Pertinent negatives include no chest pain.  Evaded blood pressure this is an ongoing problem, ran out of her medication.  Admits headache that started yesterday day gradual onset frontal 8 out of 10, states she felt off balance yesterday and had to go lay down.  Admits blurred vision, had some shortness of breath when she was laying down.  Denies chest pain, palpitations, nausea or vomiting.  Past Medical History:  Diagnosis Date   Diabetes (HCC)    Edema    H. pylori infection 06/2019   H. pylori infection 06/2019   Hypertension    Proteinuria 11/2018   Seizures (HCC)     Patient Active Problem List   Diagnosis Date Noted   Hypertension    CKD (chronic kidney disease) stage 4, GFR 15-29 ml/min (HCC)    Status epilepticus (HCC) 05/10/2021   Seizure disorder (HCC)    Acute metabolic encephalopathy 09/02/2020   Hyperosmolar hyperglycemic state (HHS) (HCC) 09/01/2020   Focal neurological deficit 09/01/2020   AKI (acute kidney injury) (HCC) 09/01/2020   Hemoglobin A1c less than 7.0% 11/26/2018   Hyperglycemia 11/26/2018   Proteinuria 11/26/2018   Joint pain in fingers of both hands 08/03/2018   Bilateral lower extremity edema 05/03/2018   Uncontrolled type 2 diabetes mellitus with hyperglycemia, with long-term current use of insulin (HCC) 01/31/2018   Age-related cataract of both eyes 01/31/2018   Elevated glucose 01/31/2018    History reviewed. No pertinent surgical history.  OB History      Gravida  5   Para      Term      Preterm      AB      Living  5      SAB      IAB      Ectopic      Multiple      Live Births               Home Medications    Prior to Admission medications   Medication Sig Start Date End Date Taking? Authorizing Provider  amLODipine (NORVASC) 5 MG tablet Take 1 tablet (5 mg total) by mouth daily. 02/15/23   Meliton Rattan, PA  aspirin EC 81 MG tablet Take 81 mg by mouth daily. Swallow whole.    [provider]  divalproex (DEPAKOTE ER) 250 MG 24 hr tablet Take 3 tablets (750 mg total) by mouth daily. 05/13/21 05/14/22  Alberteen Sam, MD  divalproex (DEPAKOTE ER) 250 MG 24 hr tablet 2 po qhs Patient taking differently: Take 250 mg by mouth at bedtime. 06/08/21     insulin degludec (TRESIBA FLEXTOUCH) 100 UNIT/ML FlexTouch Pen inject 10 units every evening Patient taking differently: Inject 10-14 Units into the skin every evening. Inject 10-14 units with evening meal depending on blood sugar. 12/11/21     Insulin Pen Needle (BD PEN NEEDLE NANO U/F) 32G X 4 MM MISC for insulin injections qdDx: E11.65 on insulin 06/08/21  levETIRAcetam (KEPPRA) 500 MG tablet Take 1 tablet (500 mg total) by mouth 2 (two) times daily. 05/13/21   Danford, Earl Lites, MD    Family History Family History  Problem Relation Age of Onset   Diabetes Mother    Hypertension Mother     Social History Social History   Tobacco Use   Smoking status: Never   Smokeless tobacco: Never  Vaping Use   Vaping status: Never Used  Substance Use Topics   Alcohol use: Never   Drug use: Never     Allergies   Patient has no known allergies.   Review of Systems Review of Systems  Constitutional:  Positive for fatigue and fever.  HENT:  Negative for congestion, rhinorrhea and sore throat.   Eyes:  Positive for visual disturbance.  Respiratory:  Positive for shortness of breath.   Cardiovascular:  Negative for chest pain.   Gastrointestinal:  Negative for nausea and vomiting.  Neurological:  Positive for dizziness and headaches.     Physical Exam Triage Vital Signs ED Triage Vitals  Encounter Vitals Group     BP 03/16/23 1406 (!) 210/91     Systolic BP Percentile --      Diastolic BP Percentile --      Pulse Rate 03/16/23 1406 78     Resp 03/16/23 1406 16     Temp 03/16/23 1406 98 F (36.7 C)     Temp Source 03/16/23 1406 Oral     SpO2 03/16/23 1406 97 %     Weight 03/16/23 1406 128 lb (58.1 kg)     Height 03/16/23 1406 5' (1.524 m)     Head Circumference --      Peak Flow --      Pain Score 03/16/23 1426 8     Pain Loc --      Pain Education --      Exclude from Growth Chart --    No data found.  Updated Vital Signs BP (!) 203/97 (BP Location: Right Arm)   Pulse 78   Temp 98 F (36.7 C) (Oral)   Resp 16   Ht 5' (1.524 m)   Wt 128 lb (58.1 kg)   SpO2 97%   BMI 25.00 kg/m   Visual Acuity Right Eye Distance:   Left Eye Distance:   Bilateral Distance:    Right Eye Near:   Left Eye Near:    Bilateral Near:     Physical Exam Vitals and nursing note reviewed.  Constitutional:      Appearance: She is not ill-appearing.  HENT:     Head: Normocephalic and atraumatic.  Eyes:     Extraocular Movements: Extraocular movements intact.     Pupils: Pupils are equal, round, and reactive to light.  Cardiovascular:     Rate and Rhythm: Normal rate.  Pulmonary:     Effort: Pulmonary effort is normal. No respiratory distress.  Musculoskeletal:     Cervical back: Neck supple.  Neurological:     Mental Status: She is alert.     Cranial Nerves: No cranial nerve deficit.     Motor: No weakness.     Gait: Gait normal.  Psychiatric:        Speech: Speech normal.        Behavior: Behavior normal.      UC Treatments / Results  Labs (all labs ordered are listed, but only abnormal results are displayed) Labs Reviewed - No data to display  EKG   Radiology  No results  found.  Procedures Procedures (including critical care time)  Medications Ordered in UC Medications - No data to display  Initial Impression / Assessment and Plan / UC Course  I have reviewed the triage vital signs and the nursing notes.  Pertinent labs & imaging results that were available during my care of the patient were reviewed by me and considered in my medical decision making (see chart for details).     63 year old female history of hypertension presents with a pressure 203/97, she reports headache, blurred vision, dizziness yesterday and some shortness of breath.  Recommend ED evaluation, neurologic exam was normal she was stable upon discharge Final Clinical Impressions(s) / UC Diagnoses   Final diagnoses:  Hypertensive urgency  Acute intractable headache, unspecified headache type     Discharge Instructions      Go to the hospital for further evaluation of your elevated blood pressure and headache   ED Prescriptions   None    PDMP not reviewed this encounter.   Meliton Rattan, [place] 03/16/23 [phone]

## 2023-03-16 NOTE — ED Triage Notes (Addendum)
 Pt presents with complaints of elevated BP and headaches x 1 day. Pt states she is on amlodipine and only has one pill left with no more refills. Pt does have a primary care appointment in two weeks. Pt currently rates her headache pain an 8/10. Tylenol taken yesterday with no relief. Amlodipine taken today at 1030 AM.

## 2023-04-02 ENCOUNTER — Encounter: Payer: Self-pay | Admitting: Family Medicine

## 2023-04-02 ENCOUNTER — Ambulatory Visit: Admitting: Family Medicine

## 2023-04-02 ENCOUNTER — Telehealth: Payer: Self-pay

## 2023-04-02 VITALS — BP 221/95 | HR 67 | Temp 97.6°F | Resp 16 | Ht 60.5 in | Wt 142.0 lb

## 2023-04-02 DIAGNOSIS — Z1159 Encounter for screening for other viral diseases: Secondary | ICD-10-CM

## 2023-04-02 DIAGNOSIS — R011 Cardiac murmur, unspecified: Secondary | ICD-10-CM

## 2023-04-02 DIAGNOSIS — E1165 Type 2 diabetes mellitus with hyperglycemia: Secondary | ICD-10-CM | POA: Diagnosis not present

## 2023-04-02 DIAGNOSIS — R600 Localized edema: Secondary | ICD-10-CM | POA: Diagnosis not present

## 2023-04-02 DIAGNOSIS — N184 Chronic kidney disease, stage 4 (severe): Secondary | ICD-10-CM | POA: Diagnosis not present

## 2023-04-02 DIAGNOSIS — G40909 Epilepsy, unspecified, not intractable, without status epilepticus: Secondary | ICD-10-CM | POA: Diagnosis not present

## 2023-04-02 DIAGNOSIS — I10 Essential (primary) hypertension: Secondary | ICD-10-CM

## 2023-04-02 DIAGNOSIS — Z0000 Encounter for general adult medical examination without abnormal findings: Secondary | ICD-10-CM

## 2023-04-02 DIAGNOSIS — Z794 Long term (current) use of insulin: Secondary | ICD-10-CM

## 2023-04-02 LAB — LIPID PANEL
Cholesterol: 271 mg/dL — ABNORMAL HIGH (ref 0–200)
HDL: 69 mg/dL (ref >39.00)
LDL Cholesterol: 187 mg/dL — ABNORMAL HIGH (ref 0–99)
NonHDL: 202.41
Total CHOL/HDL Ratio: 4
Triglycerides: 77 mg/dL (ref 0.0–149.0)
VLDL: 15.4 mg/dL (ref 0.0–40.0)

## 2023-04-02 LAB — COMPREHENSIVE METABOLIC PANEL
ALT: 13 U/L (ref 0–35)
AST: 14 U/L (ref 0–37)
Albumin: 3.8 g/dL (ref 3.5–5.2)
Alkaline Phosphatase: 93 U/L (ref 39–117)
BUN: 75 mg/dL — ABNORMAL HIGH (ref 6–23)
CO2: 27 meq/L (ref 19–32)
Calcium: 8.8 mg/dL (ref 8.4–10.5)
Chloride: 105 meq/L (ref 96–112)
Creatinine, Ser: 6.86 mg/dL (ref 0.40–1.20)
GFR: 6.05 mL/min — CL (ref >60.00)
Glucose, Bld: 84 mg/dL (ref 70–99)
Potassium: 4.2 meq/L (ref 3.5–5.1)
Sodium: 141 meq/L (ref 135–145)
Total Bilirubin: 0.4 mg/dL (ref 0.2–1.2)
Total Protein: 6.5 g/dL (ref 6.0–8.3)

## 2023-04-02 LAB — CBC WITH DIFFERENTIAL/PLATELET
Basophils Absolute: 0 10*3/uL (ref 0.0–0.1)
Basophils Relative: 0.4 % (ref 0.0–3.0)
Eosinophils Absolute: 0.1 10*3/uL (ref 0.0–0.7)
Eosinophils Relative: 2.6 % (ref 0.0–5.0)
HCT: 32.7 % — ABNORMAL LOW (ref 36.0–46.0)
Hemoglobin: 10.5 g/dL — ABNORMAL LOW (ref 12.0–15.0)
Lymphocytes Relative: 27.2 % (ref 12.0–46.0)
Lymphs Abs: 1.4 10*3/uL (ref 0.7–4.0)
MCHC: 32 g/dL (ref 30.0–36.0)
MCV: 83.2 fl (ref 78.0–100.0)
Monocytes Absolute: 0.4 10*3/uL (ref 0.1–1.0)
Monocytes Relative: 7.3 % (ref 3.0–12.0)
Neutro Abs: 3.2 10*3/uL (ref 1.4–7.7)
Neutrophils Relative %: 62.5 % (ref 43.0–77.0)
Platelets: 242 10*3/uL (ref 150.0–400.0)
RBC: 3.93 Mil/uL (ref 3.87–5.11)
RDW: 13.4 % (ref 11.5–15.5)
WBC: 5.1 10*3/uL (ref 4.0–10.5)

## 2023-04-02 LAB — MICROALBUMIN / CREATININE URINE RATIO
Creatinine,U: 54.3 mg/dL
Microalb Creat Ratio: 5330.1 mg/g — ABNORMAL HIGH (ref 0.0–30.0)
Microalb, Ur: 289.5 mg/dL — ABNORMAL HIGH (ref 0.0–1.9)

## 2023-04-02 LAB — HEMOGLOBIN A1C: Hgb A1c MFr Bld: 7.4 % — ABNORMAL HIGH (ref 4.6–6.5)

## 2023-04-02 LAB — TSH: TSH: 2.29 u[IU]/mL (ref 0.35–5.50)

## 2023-04-02 MED ORDER — LEVETIRACETAM 500 MG PO TABS: 500.0000 mg | ORAL_TABLET | Freq: Two times a day (BID) | ORAL | 11 refills | Status: DC

## 2023-04-02 MED ORDER — HYDRALAZINE HCL 10 MG PO TABS: 10.0000 mg | ORAL_TABLET | Freq: Three times a day (TID) | ORAL | 3 refills | Status: DC

## 2023-04-02 MED ORDER — AMLODIPINE BESYLATE 5 MG PO TABS: 5.0000 mg | ORAL_TABLET | Freq: Every day | ORAL | 3 refills | Status: DC

## 2023-04-02 NOTE — Telephone Encounter (Signed)
 Advise patient: Needs to go to the ER now. Kidney function is severely decreased. BP at this office visit was 221/95.  She is at very high risk of major complications such as heart problems, strokes, death. I will be happy to talk to her if needed

## 2023-04-02 NOTE — Assessment & Plan Note (Signed)
 Seizure disorder managed with Keppra. No seizures in years, occasional sensations. No recent neurology consultation. - Consult neurology for seizure management.

## 2023-04-02 NOTE — Assessment & Plan Note (Signed)
 4+ pitting bilaterally today, chronic per patient Labs today

## 2023-04-02 NOTE — Telephone Encounter (Signed)
 I left a message for the patient emphasizing the need to go to the emergency room. Suggest we check on her tomorrow.

## 2023-04-02 NOTE — Assessment & Plan Note (Signed)
 Severe hypertension with BP 221/95 mmHg due to lack of amlodipine. Asymptomatic, refused ER visit. Emphasized monitoring for symptoms and seeking immediate care if symptoms develop.. - Advise ER visit for BP management. - Patient declined.  - Restart amlodipine. Add hydralazine.  - Order labs for kidney function. - Schedule follow-up for later this week to reevaluate after resuming meds.  - Instruct to monitor for symptoms and seek immediate care if she develops any new symptoms.  - EKG stable. Murmur (no hx per patient) - order echo. Cardiology referral placed.

## 2023-04-02 NOTE — Patient Instructions (Signed)
 Recommended you go to the Emergency Department, but you declined.  Starting workup and sending in your refills, plus adding another blood pressure pill (hydralazine).  We will need you to come back on Friday to make sure blood pressure is responding. Referral placed to cardiology, nephrology, and neurology - they will be calling you.  If you develop any new/worsening symptoms (chest pain, headaches, vision changes, trouble breathing, etc) then you need to go straight to the ED for prompt evaluation.

## 2023-04-02 NOTE — Telephone Encounter (Signed)
 Called pt back and let her know the severity of her condition. She told me she would let her family know and then go to the ER.

## 2023-04-02 NOTE — Progress Notes (Signed)
 New Patient Office Visit  Subjective    Patient ID: Jill Jefferson, female    DOB: [birth year]  Age: 63 y.o. MRN: [id number]  CC:  Chief Complaint  Patient presents with   New Patient (Initial Visit)    HPI Jill Jefferson presents to establish care. She was previously at [facility] last year.    Discussed the use of AI scribe software for clinical note transcription with the patient, who gave verbal consent to proceed.  History of Present Illness Jill Jefferson is a 63 year old female with hypertension, diabetes, and a history of seizures who presents for a new primary care provider due to issues with medication management.  She has a history of hypertension with dangerously high blood pressure readings, such as 221/95 mmHg. She has visited urgent care twice this month for elevated blood pressure but declined emergency room visits. She does not monitor her blood pressure at home and experiences no symptoms like headaches or vision changes, although she has cataracts in one eye. She reports chronic swelling in her feet since childhood and has been out of her blood pressure medication, amlodipine, for almost two months.  Her diabetes is managed with [name], recently reduced to 6 units due to hypoglycemia, with blood sugar readings as low as 44 mg/dL. She does not recall her last A1c but mentions it was in the 7s. She was switched from metformin to [place] due to kidney concerns. She reports frequent urination during the day and night.  She has a history of seizures and underwent brain surgery in the 1990s. She is on Keppra 500 mg twice daily for seizure prevention and has not had a full seizure in years, though she occasionally experiences sensations that prompt her to continue the medication. She does not have a neurologist.  She is seeking a new primary care provider due to difficulties in scheduling appointments for medication management at her previous provider, [facility],  and last saw her previous provider in 2024.  She has a family history of diabetes and high blood pressure in her mother. She denies alcohol, drug, nicotine, or tobacco use. She entered menopause last year at age 63. She has no known drug allergies.        Outpatient Encounter Medications as of 04/02/2023  Medication Sig   aspirin EC 81 MG tablet Take 81 mg by mouth daily. Swallow whole.   hydrALAZINE (APRESOLINE) 10 MG tablet Take 1 tablet (10 mg total) by mouth 3 (three) times daily.   insulin degludec (TRESIBA FLEXTOUCH) 100 UNIT/ML FlexTouch Pen inject 10 units every evening (Patient taking differently: Inject 10-14 Units into the skin every evening. Inject 10-14 units with evening meal depending on blood sugar.)   Insulin Pen Needle (BD PEN NEEDLE NANO U/F) 32G X 4 MM MISC for insulin injections qdDx: E11.65 on insulin   [DISCONTINUED] amLODipine (NORVASC) 5 MG tablet Take 1 tablet (5 mg total) by mouth daily.   [DISCONTINUED] levETIRAcetam (KEPPRA) 500 MG tablet Take 1 tablet (500 mg total) by mouth 2 (two) times daily.   amLODipine (NORVASC) 5 MG tablet Take 1 tablet (5 mg total) by mouth daily.   levETIRAcetam (KEPPRA) 500 MG tablet Take 1 tablet (500 mg total) by mouth 2 (two) times daily.   [DISCONTINUED] divalproex (DEPAKOTE ER) 250 MG 24 hr tablet Take 3 tablets (750 mg total) by mouth daily.   [DISCONTINUED] divalproex (DEPAKOTE ER) 250 MG 24 hr tablet 2 po qhs (Patient taking differently: Take 250 mg by  mouth at bedtime.)   No facility-administered encounter medications on file as of 04/02/2023.    Past Medical History:  Diagnosis Date   Diabetes (HCC)    Edema    H. pylori infection 06/2019   H. pylori infection 06/2019   Hypertension    Proteinuria 11/2018   Seizures (HCC)     Past Surgical History:  Procedure Laterality Date   BRAIN SURGERY Left    1990's   CESAREAN SECTION     history of 4 c-sections    Family History  Problem Relation Age of Onset    Diabetes Mother    Hypertension Mother     Social History   Socioeconomic History   Marital status: Single    Spouse name: Not on file   Number of children: Not on file   Years of education: Not on file   Highest education level: Not on file  Occupational History   Not on file  Tobacco Use   Smoking status: Never   Smokeless tobacco: Never  Vaping Use   Vaping status: Never Used  Substance and Sexual Activity   Alcohol use: Never   Drug use: Never   Sexual activity: Not Currently  Other Topics Concern   Not on file  Social History Narrative   Not on file   Social Drivers of Health   Financial Resource Strain: Not on file  Food Insecurity: Not on file  Transportation Needs: Not on file  Physical Activity: Not on file  Stress: Not on file  Social Connections: Not on file  Intimate Partner Violence: Not on file    ROS All review of systems negative except what is listed in the HPI      Objective    BP (!) 221/95   Pulse 67   Temp 97.6 F (36.4 C) (Oral)   Resp 16   Ht 5' 0.5" (1.537 m)   Wt 142 lb (64.4 kg)   SpO2 100%   BMI 27.28 kg/m   Physical Exam Vitals reviewed.  Constitutional:      General: She is not in acute distress.    Appearance: Normal appearance.  Eyes:     Comments: Mild exophthalmus bilaterally   Cardiovascular:     Rate and Rhythm: Normal rate and regular rhythm.     Heart sounds: Murmur heard.  Pulmonary:     Effort: Pulmonary effort is normal.     Breath sounds: Normal breath sounds.  Musculoskeletal:     Right lower leg: 4+ Edema present.     Left lower leg: 4+ Edema present.  Skin:    General: Skin is warm and dry.  Neurological:     General: No focal deficit present.     Mental Status: She is alert and oriented to person, place, and time.  Psychiatric:        Mood and Affect: Mood normal.        Behavior: Behavior normal.        Thought Content: Thought content normal.        Judgment: Judgment normal.    EKG ST  67 bpm; no ischemic changes; compared to previous       Assessment & Plan:   Problem List Items Addressed This Visit       Active Problems   Uncontrolled type 2 diabetes mellitus with hyperglycemia, with long-term current use of insulin (HCC) - Primary (Chronic)   Diabetes managed with [name], adjusted to 6 units. Last A1c in the 7s,  recent values unavailable. Not on oral medications due to kidney concerns. - Order labs to assess current diabetes control, including A1c. - Continue [name] as per current regimen. Adjust regimen pending labs - some limitations due to poor renal function (see below)      Relevant Orders   Hemoglobin A1c   Lipid panel   Microalbumin / creatinine urine ratio   Seizure disorder (HCC) (Chronic)   Seizure disorder managed with Keppra. No seizures in years, occasional sensations. No recent neurology consultation. - Consult neurology for seizure management.      Relevant Medications   levETIRAcetam (KEPPRA) 500 MG tablet   Other Relevant Orders   Ambulatory referral to Neurology   Hypertension (Chronic)   Severe hypertension with BP 221/95 mmHg due to lack of amlodipine. Asymptomatic, refused ER visit. Emphasized monitoring for symptoms and seeking immediate care if symptoms develop.. - Advise ER visit for BP management. - Patient declined.  - Restart amlodipine. Add hydralazine.  - Order labs for kidney function. - Schedule follow-up for later this week to reevaluate after resuming meds.  - Instruct to monitor for symptoms and seek immediate care if she develops any new symptoms.  - EKG stable. Murmur (no hx per patient) - order echo. Cardiology referral placed.      Relevant Medications   amLODipine (NORVASC) 5 MG tablet   hydrALAZINE (APRESOLINE) 10 MG tablet   Other Relevant Orders   EKG 12-Lead (Completed)   Ambulatory referral to Cardiology   Lipid panel   TSH   ECHOCARDIOGRAM COMPLETE   CKD (chronic kidney disease) stage 4, GFR 15-29  ml/min (HCC) (Chronic)   Declining kidney function based on ED labs from 3 months ago. Discussed importance of nephrology consultation.  - Consult nephrology for kidney function management. - Order labs to assess current kidney function. - Update renal US      Relevant Orders   EKG 12-Lead (Completed)   Ambulatory referral to Nephrology   Comprehensive metabolic panel   Microalbumin / creatinine urine ratio   US Renal   Bilateral lower extremity edema   4+ pitting bilaterally today, chronic per patient Labs today       Relevant Orders   EKG 12-Lead (Completed)   Ambulatory referral to Cardiology   CBC with Differential/Platelet   Comprehensive metabolic panel   TSH   ECHOCARDIOGRAM COMPLETE   Brain natriuretic peptide   Murmur, cardiac   Patient unaware of ever being told she has a murmur. Further evaluation needed. - EKG stable. Echo ordered. - Consult cardiology for further evaluation of heart murmur.       Relevant Orders   EKG 12-Lead (Completed)   Ambulatory referral to Cardiology   ECHOCARDIOGRAM COMPLETE   Other Visit Diagnoses       Encounter for medical examination to establish care         Encounter for hepatitis C screening test for low risk patient       Relevant Orders   Hepatitis C antibody             Return in about 3 days (around 04/05/2023) for HTN follow-up.   Clayborne Dana, NP

## 2023-04-02 NOTE — Assessment & Plan Note (Signed)
 Declining kidney function based on ED labs from 3 months ago. Discussed importance of nephrology consultation.  - Consult nephrology for kidney function management. - Order labs to assess current kidney function. - Update renal US

## 2023-04-02 NOTE — Assessment & Plan Note (Signed)
 Diabetes managed with [name], adjusted to 6 units. Last A1c in the 7s, recent values unavailable. Not on oral medications due to kidney concerns. - Order labs to assess current diabetes control, including A1c. - Continue [name] as per current regimen. Adjust regimen pending labs - some limitations due to poor renal function (see below)

## 2023-04-02 NOTE — Telephone Encounter (Signed)
 CRITICAL VALUE STICKER  CRITICAL VALUE: GFR: 6.05 Creatinine: 6.86  RECEIVER (on-site recipient of call): Melton Alar, RMA  DATE & TIME NOTIFIED:  4:45 PM 04/02/23  MESSENGER (representative from lab): Curley Spice  MD NOTIFIED: PCP and DOD: Drue Novel  TIME OF NOTIFICATION: 4:45 PM, 04/02/23  RESPONSE:  pending

## 2023-04-02 NOTE — Assessment & Plan Note (Signed)
 Patient unaware of ever being told she has a murmur. Further evaluation needed. - EKG stable. Echo ordered. - Consult cardiology for further evaluation of heart murmur.

## 2023-04-03 LAB — BRAIN NATRIURETIC PEPTIDE: Brain Natriuretic Peptide: 290 pg/mL — ABNORMAL HIGH (ref <100)

## 2023-04-03 LAB — HEPATITIS C ANTIBODY: Hepatitis C Ab: NONREACTIVE

## 2023-04-03 NOTE — Telephone Encounter (Signed)
 Called and LVM letting patient know how important it is for her to go to the ED for evaluation.

## 2023-04-03 NOTE — Telephone Encounter (Signed)
 FYI- Pt did not go to ED.

## 2023-04-03 NOTE — Telephone Encounter (Signed)
 Please call her this morning and let her know we really need her to go. Even though she is not having major symptoms, her kidneys are not working properly and she needs urgent workup/treatment.

## 2023-04-03 NOTE — Telephone Encounter (Signed)
 Tried to call again with no answer, also tried emergency contact (daughter) with no answer.

## 2023-04-04 NOTE — Telephone Encounter (Signed)
 Unable to reach patient or emergency contact, except by voicemail. Jill Jefferson - FYI. Still has not gone to the hospital per chart review.

## 2023-04-04 NOTE — Telephone Encounter (Signed)
 Thanks. Looks like she is scheduled to see me again tomorrow.

## 2023-04-05 ENCOUNTER — Ambulatory Visit: Admitting: Family Medicine

## 2023-04-08 ENCOUNTER — Ambulatory Visit (HOSPITAL_BASED_OUTPATIENT_CLINIC_OR_DEPARTMENT_OTHER): Admission: RE | Admit: 2023-04-08 | Source: Ambulatory Visit

## 2023-04-22 ENCOUNTER — Other Ambulatory Visit (HOSPITAL_BASED_OUTPATIENT_CLINIC_OR_DEPARTMENT_OTHER)

## 2023-04-24 ENCOUNTER — Ambulatory Visit (HOSPITAL_BASED_OUTPATIENT_CLINIC_OR_DEPARTMENT_OTHER)
Admission: RE | Admit: 2023-04-24 | Discharge: 2023-04-24 | Disposition: A | Discharge status: Home / Self Care | Source: Ambulatory Visit | Attending: Family Medicine | Admitting: Family Medicine

## 2023-04-24 DIAGNOSIS — N184 Chronic kidney disease, stage 4 (severe): Secondary | ICD-10-CM | POA: Insufficient documentation

## 2023-05-02 ENCOUNTER — Encounter: Payer: Self-pay | Admitting: Family Medicine

## 2023-05-08 ENCOUNTER — Encounter: Payer: Self-pay | Admitting: Neurology

## 2023-06-28 ENCOUNTER — Other Ambulatory Visit: Payer: Self-pay | Admitting: Family Medicine

## 2023-06-28 DIAGNOSIS — I10 Essential (primary) hypertension: Secondary | ICD-10-CM

## 2023-07-11 ENCOUNTER — Inpatient Hospital Stay (HOSPITAL_BASED_OUTPATIENT_CLINIC_OR_DEPARTMENT_OTHER)
Admission: EM | Admit: 2023-07-11 | Discharge: 2023-07-18 | DRG: 682 | Disposition: A | Discharge status: Home / Self Care | Source: Ambulatory Visit | Attending: Internal Medicine | Admitting: Internal Medicine

## 2023-07-11 ENCOUNTER — Emergency Department (HOSPITAL_BASED_OUTPATIENT_CLINIC_OR_DEPARTMENT_OTHER)

## 2023-07-11 ENCOUNTER — Encounter (HOSPITAL_BASED_OUTPATIENT_CLINIC_OR_DEPARTMENT_OTHER): Payer: Self-pay | Admitting: Emergency Medicine

## 2023-07-11 ENCOUNTER — Other Ambulatory Visit: Payer: Self-pay

## 2023-07-11 DIAGNOSIS — Z1152 Encounter for screening for COVID-19: Secondary | ICD-10-CM

## 2023-07-11 DIAGNOSIS — N289 Disorder of kidney and ureter, unspecified: Secondary | ICD-10-CM

## 2023-07-11 DIAGNOSIS — I5032 Chronic diastolic (congestive) heart failure: Secondary | ICD-10-CM

## 2023-07-11 DIAGNOSIS — N185 Chronic kidney disease, stage 5: Secondary | ICD-10-CM | POA: Diagnosis present

## 2023-07-11 DIAGNOSIS — E1122 Type 2 diabetes mellitus with diabetic chronic kidney disease: Secondary | ICD-10-CM | POA: Diagnosis present

## 2023-07-11 DIAGNOSIS — D631 Anemia in chronic kidney disease: Secondary | ICD-10-CM | POA: Diagnosis present

## 2023-07-11 DIAGNOSIS — Z7982 Long term (current) use of aspirin: Secondary | ICD-10-CM

## 2023-07-11 DIAGNOSIS — E1165 Type 2 diabetes mellitus with hyperglycemia: Secondary | ICD-10-CM | POA: Diagnosis present

## 2023-07-11 DIAGNOSIS — H259 Unspecified age-related cataract: Secondary | ICD-10-CM | POA: Diagnosis present

## 2023-07-11 DIAGNOSIS — N184 Chronic kidney disease, stage 4 (severe): Secondary | ICD-10-CM | POA: Diagnosis present

## 2023-07-11 DIAGNOSIS — I132 Hypertensive heart and chronic kidney disease with heart failure and with stage 5 chronic kidney disease, or end stage renal disease: Secondary | ICD-10-CM | POA: Diagnosis present

## 2023-07-11 DIAGNOSIS — Z8249 Family history of ischemic heart disease and other diseases of the circulatory system: Secondary | ICD-10-CM

## 2023-07-11 DIAGNOSIS — J9601 Acute respiratory failure with hypoxia: Secondary | ICD-10-CM | POA: Diagnosis present

## 2023-07-11 DIAGNOSIS — I509 Heart failure, unspecified: Principal | ICD-10-CM

## 2023-07-11 DIAGNOSIS — R0603 Acute respiratory distress: Secondary | ICD-10-CM

## 2023-07-11 DIAGNOSIS — R339 Retention of urine, unspecified: Secondary | ICD-10-CM | POA: Diagnosis present

## 2023-07-11 DIAGNOSIS — D509 Iron deficiency anemia, unspecified: Secondary | ICD-10-CM | POA: Diagnosis present

## 2023-07-11 DIAGNOSIS — N186 End stage renal disease: Secondary | ICD-10-CM | POA: Diagnosis present

## 2023-07-11 DIAGNOSIS — Z79899 Other long term (current) drug therapy: Secondary | ICD-10-CM

## 2023-07-11 DIAGNOSIS — I5043 Acute on chronic combined systolic (congestive) and diastolic (congestive) heart failure: Secondary | ICD-10-CM | POA: Diagnosis present

## 2023-07-11 DIAGNOSIS — G40909 Epilepsy, unspecified, not intractable, without status epilepticus: Secondary | ICD-10-CM | POA: Diagnosis present

## 2023-07-11 DIAGNOSIS — M7989 Other specified soft tissue disorders: Secondary | ICD-10-CM | POA: Diagnosis present

## 2023-07-11 DIAGNOSIS — I10 Essential (primary) hypertension: Secondary | ICD-10-CM | POA: Diagnosis present

## 2023-07-11 DIAGNOSIS — N179 Acute kidney failure, unspecified: Principal | ICD-10-CM | POA: Diagnosis present

## 2023-07-11 DIAGNOSIS — Z833 Family history of diabetes mellitus: Secondary | ICD-10-CM

## 2023-07-11 DIAGNOSIS — I5033 Acute on chronic diastolic (congestive) heart failure: Secondary | ICD-10-CM | POA: Insufficient documentation

## 2023-07-11 DIAGNOSIS — R188 Other ascites: Secondary | ICD-10-CM | POA: Diagnosis present

## 2023-07-11 DIAGNOSIS — Z794 Long term (current) use of insulin: Secondary | ICD-10-CM

## 2023-07-11 DIAGNOSIS — E1136 Type 2 diabetes mellitus with diabetic cataract: Secondary | ICD-10-CM | POA: Diagnosis present

## 2023-07-11 DIAGNOSIS — J9811 Atelectasis: Secondary | ICD-10-CM | POA: Diagnosis present

## 2023-07-11 LAB — COMPREHENSIVE METABOLIC PANEL WITH GFR
ALT: 32 U/L (ref 0–44)
AST: 24 U/L (ref 15–41)
Albumin: 3.3 g/dL — ABNORMAL LOW (ref 3.5–5.0)
Alkaline Phosphatase: 79 U/L (ref 38–126)
Anion gap: 13 (ref 5–15)
BUN: 86 mg/dL — ABNORMAL HIGH (ref 8–23)
CO2: 21 mmol/L — ABNORMAL LOW (ref 22–32)
Calcium: 8.8 mg/dL — ABNORMAL LOW (ref 8.9–10.3)
Chloride: 107 mmol/L (ref 98–111)
Creatinine, Ser: 11.5 mg/dL — ABNORMAL HIGH (ref 0.44–1.00)
GFR, Estimated: 3 mL/min — ABNORMAL LOW (ref >60)
Glucose, Bld: 103 mg/dL — ABNORMAL HIGH (ref 70–99)
Potassium: 4.4 mmol/L (ref 3.5–5.1)
Sodium: 141 mmol/L (ref 135–145)
Total Bilirubin: 0.2 mg/dL (ref 0.0–1.2)
Total Protein: 6.2 g/dL — ABNORMAL LOW (ref 6.5–8.1)

## 2023-07-11 LAB — CBC WITH DIFFERENTIAL/PLATELET
Abs Immature Granulocytes: 0.03 10*3/uL (ref 0.00–0.07)
Basophils Absolute: 0 10*3/uL (ref 0.0–0.1)
Basophils Relative: 0 %
Eosinophils Absolute: 0 10*3/uL (ref 0.0–0.5)
Eosinophils Relative: 1 %
HCT: 27.5 % — ABNORMAL LOW (ref 36.0–46.0)
Hemoglobin: 8.4 g/dL — ABNORMAL LOW (ref 12.0–15.0)
Immature Granulocytes: 0 %
Lymphocytes Relative: 10 %
Lymphs Abs: 0.7 10*3/uL (ref 0.7–4.0)
MCH: 24.2 pg — ABNORMAL LOW (ref 26.0–34.0)
MCHC: 30.5 g/dL (ref 30.0–36.0)
MCV: 79.3 fL — ABNORMAL LOW (ref 80.0–100.0)
Monocytes Absolute: 0.5 10*3/uL (ref 0.1–1.0)
Monocytes Relative: 7 %
Neutro Abs: 5.6 10*3/uL (ref 1.7–7.7)
Neutrophils Relative %: 82 %
Platelets: 293 10*3/uL (ref 150–400)
RBC: 3.47 MIL/uL — ABNORMAL LOW (ref 3.87–5.11)
RDW: 14.4 % (ref 11.5–15.5)
WBC: 6.8 10*3/uL (ref 4.0–10.5)
nRBC: 0 % (ref 0.0–0.2)

## 2023-07-11 LAB — I-STAT VENOUS BLOOD GAS, ED
Acid-base deficit: 4 mmol/L — ABNORMAL HIGH (ref 0.0–2.0)
Bicarbonate: 21.1 mmol/L (ref 20.0–28.0)
Calcium, Ion: 1.1 mmol/L — ABNORMAL LOW (ref 1.15–1.40)
HCT: 26 % — ABNORMAL LOW (ref 36.0–46.0)
Hemoglobin: 8.8 g/dL — ABNORMAL LOW (ref 12.0–15.0)
O2 Saturation: 97 %
Potassium: 4.3 mmol/L (ref 3.5–5.1)
Sodium: 140 mmol/L (ref 135–145)
TCO2: 22 mmol/L (ref 22–32)
pCO2, Ven: 38.5 mmHg — ABNORMAL LOW (ref 44–60)
pH, Ven: 7.347 (ref 7.25–7.43)
pO2, Ven: 93 mmHg — ABNORMAL HIGH (ref 32–45)

## 2023-07-11 LAB — IRON AND TIBC
Iron: 17 ug/dL — ABNORMAL LOW (ref 28–170)
Saturation Ratios: 5 % — ABNORMAL LOW (ref 10.4–31.8)
TIBC: 321 ug/dL (ref 250–450)
UIBC: 304 ug/dL

## 2023-07-11 LAB — RETICULOCYTES
Immature Retic Fract: 17 % — ABNORMAL HIGH (ref 2.3–15.9)
RBC.: 3.72 MIL/uL — ABNORMAL LOW (ref 3.87–5.11)
Retic Count, Absolute: 61 10*3/uL (ref 19.0–186.0)
Retic Ct Pct: 1.6 % (ref 0.4–3.1)

## 2023-07-11 LAB — CBG MONITORING, ED: Glucose-Capillary: 93 mg/dL (ref 70–99)

## 2023-07-11 LAB — TROPONIN T, HIGH SENSITIVITY
Troponin T High Sensitivity: 108 ng/L (ref <19)
Troponin T High Sensitivity: 99 ng/L (ref <19)

## 2023-07-11 LAB — RESP PANEL BY RT-PCR (RSV, FLU A&B, COVID)  RVPGX2
Influenza A by PCR: NEGATIVE
Influenza B by PCR: NEGATIVE
Resp Syncytial Virus by PCR: NEGATIVE
SARS Coronavirus 2 by RT PCR: NEGATIVE

## 2023-07-11 LAB — VITAMIN B12: Vitamin B-12: 1362 pg/mL — ABNORMAL HIGH (ref 180–914)

## 2023-07-11 LAB — FERRITIN: Ferritin: 41 ng/mL (ref 11–307)

## 2023-07-11 LAB — FOLATE: Folate: 16.8 ng/mL (ref >5.9)

## 2023-07-11 LAB — PRO BRAIN NATRIURETIC PEPTIDE: Pro Brain Natriuretic Peptide: 33608 pg/mL — ABNORMAL HIGH (ref <300.0)

## 2023-07-11 MED ORDER — FUROSEMIDE 10 MG/ML IJ SOLN
80.0000 mg | Freq: Once | INTRAMUSCULAR | Status: AC
  Administered 2023-07-11: 80 mg via INTRAVENOUS
  Filled 2023-07-11: qty 8

## 2023-07-11 MED ORDER — NITROGLYCERIN IN D5W 200-5 MCG/ML-% IV SOLN
0.0000 ug/min | INTRAVENOUS | Status: DC
  Administered 2023-07-11: 5 ug/min via INTRAVENOUS
  Administered 2023-07-12: 200 ug/min via INTRAVENOUS
  Filled 2023-07-11 (×4): qty 250

## 2023-07-11 MED ORDER — DIAZEPAM 5 MG/ML IJ SOLN
2.5000 mg | Freq: Once | INTRAMUSCULAR | Status: AC
  Administered 2023-07-11: 2.5 mg via INTRAVENOUS
  Filled 2023-07-11: qty 2

## 2023-07-11 MED ORDER — NITROGLYCERIN 0.4 MG SL SUBL
0.4000 mg | SUBLINGUAL_TABLET | Freq: Once | SUBLINGUAL | Status: AC
  Administered 2023-07-11: 0.4 mg via SUBLINGUAL
  Filled 2023-07-11: qty 1

## 2023-07-11 MED ORDER — DIAZEPAM 5 MG PO TABS
5.0000 mg | ORAL_TABLET | Freq: Once | ORAL | Status: DC
  Filled 2023-07-11: qty 1

## 2023-07-11 NOTE — ED Triage Notes (Signed)
 2 weeks ago noticed swelling in legs. Has spread to whole body. Denies swelling of tongue or airway. Some SOB. CBG 65 this AM. Doreen, lasix  daily.

## 2023-07-11 NOTE — Progress Notes (Signed)
 Jill Jefferson is a 63 y.o. female pt has not been eating , blood sugars have been low , and having some swallowing

## 2023-07-11 NOTE — Plan of Care (Signed)
 Plan of Care Note for accepted transfer   Patient name: Oneida Mckamey FMW:[id number] DOB: Mar 13, 1960  Facility requesting transfer: [facility] ED Requesting Provider: Dr. Elnor Facility course: 63 year old female with history of seizure disorder, CKD stage IV, type 2 diabetes, hypertension presented to the ED with complaints of dyspnea, leg swelling, weight gain, and orthopnea.  Hypertensive with SBP up to 180s.  Placed on BiPAP due to hypoxia (SpO2 81% on room air) and increased work of breathing.  Labs notable for WBC count 6.8, hemoglobin 8.4 (previously 10.5 in March 2025), BUN 86, creatinine 11.5 (previously 6.8 in March 2025 and 4.1 in December 2024), potassium 4.4, bicarb 21, proBNP 33,608, troponin 108> 99, COVID/influenza/RSV PCR negative, VBG with pH 7.34 and pCO2 38.5.  Chest x-ray showing interval increase in the moderate left and mild right basilar homogenous opacities likely representing pleural effusions with associated likely atelectasis.  CT abdomen pelvis showing: IMPRESSION: 1. Moderate bilateral pleural effusions with associated atelectasis. 2. Findings suggestive of third spacing with marked generalized subcutaneous edema, confluent in the flanks. Mild edema of the intra-abdominal fat. Trace ascites in the pelvis. 3. Right renal atrophy. No hydronephrosis.   Aortic Atherosclerosis (ICD10-I70.0).  Patient was given Valium, IV Lasix  80 mg, sublingual nitroglycerin and subsequently started on nitroglycerin infusion. ED physician discussed the case with Dr. Dennise and nephrology will consult in the morning.  Recommended aggressive diuresis with Lasix  120 mg BID or TID depending on severity of volume status.  Likely ESRD at this point therefore suspecting she will need to start HD during this admission.   Plan of care: The patient is accepted for admission to Progressive unit at [facility].  [facility] will assume care on arrival to accepting facility. Until arrival, care as  per EDP. However, [facility] available 24/7 for questions and assistance.  Check [email or web address] for on-call coverage.  Nursing staff, please call [facility] Admits & Consults System-Wide number under Amion on patient's arrival so appropriate admitting provider can evaluate the pt.

## 2023-07-11 NOTE — ED Notes (Signed)
 Pt remains on BP at this time. Pt respiratory status stable w/no distress noted at this time. Pt states her breathing has improved and appears to be more comfortable. RT performed oral care on pt for dry mouth at pt request. Pt transported to CT from RM#10 and back w/out complication. RT will continue to monitor while @ [facility].    07/11/23 1945  BiPAP/CPAP/SIPAP  BiPAP/CPAP/SIPAP Pt Type Adult  BiPAP/CPAP/SIPAP SERVO  Mask Type Full face mask  Dentures removed? Not applicable  Mask Size Medium  Set Rate 17 breaths/min  Respiratory Rate 2 breaths/min  IPAP 12 cmH20  EPAP 5 cmH2O  Pressure Support 7 cmH20  PEEP 5 cmH20  FiO2 (%) 40 %  Minute Ventilation 11  Leak 7  Peak Inspiratory Pressure (PIP) 13  Tidal Volume (Vt) 403  Patient Home Machine No  Patient Home Mask No  Patient Home Tubing No  Auto Titrate No  Press High Alarm 25 cmH2O  BiPAP/CPAP /SiPAP Vitals  Pulse Rate 84  Resp (!) 25  BP (!) 174/93  SpO2 99 %  MEWS Score/Color  MEWS Score 1  MEWS Score Color Green

## 2023-07-11 NOTE — ED Notes (Signed)
 No urine output yet at this time. EDP notified and aware.

## 2023-07-11 NOTE — ED Notes (Signed)
 Pt reports she feels like she has to urinate but can't. Bladder scan performed, 308 mL in bladder. EDP notified.

## 2023-07-11 NOTE — ED Notes (Signed)
 PT placed on bipap due to SOB and WOB. PT currently on 12/5 and 50%.  Pt tolerating well.  HR 88, RR 22, sats 100%.  RN at bedside.

## 2023-07-11 NOTE — ED Notes (Signed)
 RT titrated oxygen from 40% to 21% on BP. Pt respiratory status stable and resting comfortably. RT will continue to monitor while @ [facility].    07/11/23 2159  Therapy Vitals  Pulse Rate 79  Resp 20  MEWS Score/Color  MEWS Score 0  MEWS Score Color Green  Respiratory Assessment  Assessment Type Assess only  Respiratory Pattern Labored;Dyspnea at rest;Symmetrical;Tachypnea  Chest Assessment Chest expansion symmetrical  Cough None  Bilateral Breath Sounds Diminished  R Upper  Breath Sounds Diminished  L Upper Breath Sounds Diminished  R Lower Breath Sounds Diminished  L Lower Breath Sounds Diminished  Oxygen Therapy/Pulse Ox  O2 Device Bi-PAP  O2 Therapy Oxygen  FiO2 (%) (S)  21 %  SpO2 100 %

## 2023-07-11 NOTE — ED Notes (Signed)
 EDP notified of no urine output at this time. Bladder scan to be performed.

## 2023-07-11 NOTE — ED Provider Notes (Signed)
 [place] EMERGENCY DEPARTMENT AT [facility] Provider Note  CSN: [id number] Arrival date & time: 07/11/23 1503  Chief Complaint(s) Shortness of Breath and Hypoglycemia  HPI Jill Jefferson is a 63 y.o. female with past medical history as below, significant for DM, CKD, seizure disorder on keppra  who presents to the ED with complaint of dib  Pt reports worsening dib x2 wks, worsened leg swelling, abd swelling, weight gain, orthopnea, exertional dyspnea. She has been compliant w/ her lasix  and with her fluid restriction but despite this has gained around 10 lbs from her dry weight per pt  Does not wear home o2  Past Medical History Past Medical History:  Diagnosis Date   Diabetes (HCC)    Edema    H. pylori infection 06/2019   H. pylori infection 06/2019   Hypertension    Proteinuria 11/2018   Seizures (HCC)    Patient Active Problem List   Diagnosis Date Noted   Murmur, cardiac 04/02/2023   Hypertension    CKD (chronic kidney disease) stage 4, GFR 15-29 ml/min (HCC)    Status epilepticus (HCC) 05/10/2021   Seizure disorder (HCC)    Acute metabolic encephalopathy 09/02/2020   Hyperosmolar hyperglycemic state (HHS) (HCC) 09/01/2020   Focal neurological deficit 09/01/2020   AKI (acute kidney injury) (HCC) 09/01/2020   Hemoglobin A1c less than 7.0% 11/26/2018   Hyperglycemia 11/26/2018   Proteinuria 11/26/2018   Joint pain in fingers of both hands 08/03/2018   Bilateral lower extremity edema 05/03/2018   Uncontrolled type 2 diabetes mellitus with hyperglycemia, with long-term current use of insulin  (HCC) 01/31/2018   Age-related cataract of both eyes 01/31/2018   Elevated glucose 01/31/2018   Home Medication(s) Prior to Admission medications   Medication Sig Start Date End Date Taking? Authorizing Provider  furosemide  (LASIX ) 20 MG tablet Take 20 mg by mouth 2 (two) times daily. 06/12/23  Yes [provider]  amLODipine  (NORVASC ) 5 MG tablet Take 1 tablet  (5 mg total) by mouth daily. 04/02/23   Almarie Waddell [name], NP  aspirin  EC 81 MG tablet Take 81 mg by mouth daily. Swallow whole.    [provider]  hydrALAZINE  (APRESOLINE ) 10 MG tablet Take 1 tablet (10 mg total) by mouth 3 (three) times daily. 04/02/23   Almarie Waddell [name], NP  insulin  degludec (TRESIBA  FLEXTOUCH) 100 UNIT/ML FlexTouch Pen inject 10 units every evening Patient taking differently: Inject 10-14 Units into the skin every evening. Inject 10-14 units with evening meal depending on blood sugar. 12/11/21     Insulin  Pen Needle (BD PEN NEEDLE NANO U/F) 32G X 4 MM MISC for insulin  injections qdDx: E11.65 on insulin  06/08/21     levETIRAcetam  (KEPPRA ) 500 MG tablet Take 1 tablet (500 mg total) by mouth 2 (two) times daily. 04/02/23   Almarie Waddell [name], NP  Past Surgical History Past Surgical History:  Procedure Laterality Date   BRAIN SURGERY Left    1990's   CESAREAN SECTION     history of 4 c-sections   Family History Family History  Problem Relation Age of Onset   Diabetes Mother    Hypertension Mother     Social History Social History   Tobacco Use   Smoking status: Never   Smokeless tobacco: Never  Vaping Use   Vaping status: Never Used  Substance Use Topics   Alcohol use: Never   Drug use: Never   Allergies Patient has no known allergies.  Review of Systems A thorough review of systems was obtained and all systems are negative except as noted in the HPI and PMH.   Physical Exam Vital Signs  I have reviewed the triage vital signs BP (!) 172/100   Pulse 83   Temp 97.6 F (36.4 C) (Oral)   Resp (!) 23   SpO2 100%  Physical Exam Vitals and nursing note reviewed.  Constitutional:      Appearance: Normal appearance. She is ill-appearing.  HENT:     Head: Normocephalic and atraumatic.     Right Ear: External ear normal.     Left  Ear: External ear normal.     Nose: Nose normal.     Mouth/Throat:     Mouth: Mucous membranes are moist.  Eyes:     General: No scleral icterus.       Right eye: No discharge.        Left eye: No discharge.  Cardiovascular:     Rate and Rhythm: Normal rate and regular rhythm.     Pulses: Normal pulses.     Heart sounds: Normal heart sounds.  Pulmonary:     Effort: Tachypnea, accessory muscle usage and respiratory distress present.     Breath sounds: No stridor. Rales present.  Abdominal:     General: Abdomen is flat. There is no distension.     Palpations: Abdomen is soft.     Tenderness: There is no abdominal tenderness.  Musculoskeletal:     Cervical back: No rigidity.     Right lower leg: Edema present.     Left lower leg: Edema present.     Comments: 3+ edema to thigh b/l  Skin:    General: Skin is warm and dry.     Capillary Refill: Capillary refill takes less than 2 seconds.  Neurological:     Mental Status: She is alert.  Psychiatric:        Mood and Affect: Mood normal.        Behavior: Behavior normal. Behavior is cooperative.     ED Results and Treatments Labs (all labs ordered are listed, but only abnormal results are displayed) Labs Reviewed  PRO BRAIN NATRIURETIC PEPTIDE - Abnormal; Notable for the following components:      Result Value   Pro Brain Natriuretic Peptide 33,608.0 (*)    All other components within normal limits  CBC WITH DIFFERENTIAL/PLATELET - Abnormal; Notable for the following components:   RBC 3.47 (*)    Hemoglobin 8.4 (*)    HCT 27.5 (*)    MCV 79.3 (*)    MCH 24.2 (*)    All other components within normal limits  COMPREHENSIVE METABOLIC PANEL WITH GFR - Abnormal; Notable for the following components:   CO2 21 (*)    Glucose, Bld 103 (*)    BUN 86 (*)    Creatinine, Ser 11.50 (*)  Calcium  8.8 (*)    Total Protein 6.2 (*)    Albumin 3.3 (*)    GFR, Estimated 3 (*)    All other components within normal limits   RETICULOCYTES - Abnormal; Notable for the following components:   RBC. 3.72 (*)    Immature Retic Fract 17.0 (*)    All other components within normal limits  I-STAT VENOUS BLOOD GAS, ED - Abnormal; Notable for the following components:   pCO2, Ven 38.5 (*)    pO2, Ven 93 (*)    Acid-base deficit 4.0 (*)    Calcium , Ion 1.10 (*)    HCT 26.0 (*)    Hemoglobin 8.8 (*)    All other components within normal limits  TROPONIN T, HIGH SENSITIVITY - Abnormal; Notable for the following components:   Troponin T High Sensitivity 108 (*)    All other components within normal limits  TROPONIN T, HIGH SENSITIVITY - Abnormal; Notable for the following components:   Troponin T High Sensitivity 99 (*)    All other components within normal limits  RESP PANEL BY RT-PCR (RSV, FLU A&B, COVID)  RVPGX2  LEVETIRACETAM  LEVEL  VITAMIN B12  FOLATE  IRON AND TIBC  FERRITIN  CBG MONITORING, ED                                                                                                                          Radiology CT ABDOMEN PELVIS WO CONTRAST Result Date: 07/11/2023 CLINICAL DATA:  Acute kidney failure. EXAM: CT ABDOMEN AND PELVIS WITHOUT CONTRAST TECHNIQUE: Multidetector CT imaging of the abdomen and pelvis was performed following the standard protocol without IV contrast. RADIATION DOSE REDUCTION: This exam was performed according to the departmental dose-optimization program which includes automated exposure control, adjustment of the mA and/or kV according to patient size and/or use of iterative reconstruction technique. COMPARISON:  Renal ultrasound 04/24/2023 FINDINGS: Lower chest: Moderate bilateral pleural effusions with associated atelectasis. The heart is upper normal in size. Decreased density of the blood pool suggests anemia. Hepatobiliary: Elongated right lobe of the liver. No evidence of focal liver lesion on this unenhanced exam. Gallbladder physiologically distended, no calcified stone. No  biliary dilatation. Pancreas: No ductal dilatation or inflammation. Spleen: Normal in size without focal abnormality. Adrenals/Urinary Tract: No adrenal nodule. Right renal atrophy. No hydronephrosis. Small bilateral renal cysts. No further follow-up imaging is recommended. No renal calculi. Partially distended urinary bladder, no wall thickening for degree of distension. Stomach/Bowel: No bowel obstruction or inflammatory change. Moderate colonic stool burden. Normal appendix. Vascular/Lymphatic: Aortic and branch atherosclerosis. No aortic aneurysm. No bulky abdominopelvic adenopathy. Reproductive: Uterus and bilateral adnexa are unremarkable. Other: There is marked generalized subcutaneous edema, confluent in the flanks. Mild edema of the intra-abdominal fat. Trace ascites in the pelvis. No free air. Minimal fat containing umbilical hernia. Musculoskeletal: There are no acute or suspicious osseous abnormalities. IMPRESSION: 1. Moderate bilateral pleural effusions with associated atelectasis. 2. Findings suggestive of third spacing with marked generalized  subcutaneous edema, confluent in the flanks. Mild edema of the intra-abdominal fat. Trace ascites in the pelvis. 3. Right renal atrophy. No hydronephrosis. Aortic Atherosclerosis (ICD10-I70.0). Electronically Signed   By: Andrea Gasman M.D.   On: 07/11/2023 19:55   DG Chest Port 1 View Result Date: 07/11/2023 CLINICAL DATA:  2nd leg now spread to whole body.  On daily 6. EXAM: PORTABLE CHEST 1 VIEW COMPARISON:  Chest radiographs 06/12/2023 and 09/04/2020 FINDINGS: Interval increase in now moderate left and mild right basilar homogeneous opacities likely representing pleural effusions with associated likely atelectasis. No pneumothorax is seen. The visualized cardiac silhouette is unchanged and again may be mildly enlarged. No acute skeletal abnormality. IMPRESSION: Interval increase in now moderate left and mild right basilar homogeneous opacities likely  representing pleural effusions with associated likely atelectasis. Electronically Signed   By: Tanda Lyons M.D.   On: 07/11/2023 16:30    Pertinent labs & imaging results that were available during my care of the patient were reviewed by me and considered in my medical decision making (see MDM for details).  Medications Ordered in ED Medications  nitroGLYCERIN 50 mg in dextrose  5 % 250 mL (0.2 mg/mL) infusion (60 mcg/min Intravenous Rate/Dose Change 07/11/23 2016)  furosemide  (LASIX ) injection 80 mg (80 mg Intravenous Given 07/11/23 1628)  nitroGLYCERIN (NITROSTAT) SL tablet 0.4 mg (0.4 mg Sublingual Given 07/11/23 1619)  diazepam (VALIUM) injection 2.5 mg (2.5 mg Intravenous Given 07/11/23 1751)                                                                                                                                     Procedures .Critical Care  Performed by: Elnor Jayson LABOR, DO Authorized by: Elnor Jayson LABOR, DO   Critical care provider statement:    Critical care time (minutes):  90   Critical care time was exclusive of:  Separately billable procedures and treating other patients   Critical care was necessary to treat or prevent imminent or life-threatening deterioration of the following conditions:  Cardiac failure, respiratory failure and renal failure   Critical care was time spent personally by me on the following activities:  Development of treatment plan with patient or surrogate, discussions with consultants, evaluation of patient's response to treatment, examination of patient, ordering and review of laboratory studies, ordering and review of radiographic studies, ordering and performing treatments and interventions, pulse oximetry, re-evaluation of patient's condition, review of old charts and obtaining history from patient or surrogate   Care discussed with: admitting provider     (including critical care time)  Medical Decision Making / ED Course    Medical Decision  Making:    Jill Jefferson is a 63 y.o. female with past medical history as below, significant for DM, CKD, seizure disorder on keppra  who presents to the ED with complaint of dib. The complaint involves an extensive differential diagnosis and also carries with it a high risk  of complications and morbidity.  Serious etiology was considered. Ddx includes but is not limited to: In my evaluation of this patient's dyspnea my DDx includes, but is not limited to, pneumonia, pulmonary embolism, pneumothorax, pulmonary edema, metabolic acidosis, asthma, COPD, cardiac cause, anemia, anxiety, etc.    Complete initial physical exam performed, notably the patient was in acute respiratory distress, hypoxia on RA at 84%.    Reviewed and confirmed nursing documentation for past medical history, family history, social history.  Vital signs reviewed.     Brief summary:  63 yo/f w/ hx as above here with dib/leg swelling  Hypoxia on arrival, no home o2, improved to 95% on 6L [place], still having tachypnea   Clinical Course as of 07/11/23 2017  Thu Jul 11, 2023  1650 Hemoglobin(!): 8.4 Down from prior, 10.5 three mos ago. She denies bleeding. Concern anemia chronic dz, send anemia panel [SG]  1750 CXR c/w acute HF [SG]  1924 Urinary retention noted, foley placed, voiding well [SG]    Clinical Course User Index [SG] Elnor Savant A, DO   Respiratory distress likely 2/2 chf exacerbation Hypertensive emergency> -Fluid overloaded on exam, rales b/l, resp distress; pleural eff on XR, hypoxia 84% on RA (no home o2) -Lasix  80mg  IV -NTG gtt -improved w/ BIPAP - BNP over 30,000, troponin is also elevated likely demand ischemia in setting of acute CHF  Renal insufficiency> - History of CKD - Acutely worsening renal function today, potassium is stable, bicarb is reduced - Consulted nephrology Dr Dennise who will see in consult in am - CT abdomen   Admit hospitalist              Additional history  obtained: -Additional history obtained from sister -External records from outside source obtained and reviewed including: Chart review including previous notes, labs, imaging, consultation notes including  Prior echo   Lab Tests: -I ordered, reviewed, and interpreted labs.   The pertinent results include:   Labs Reviewed  PRO BRAIN NATRIURETIC PEPTIDE - Abnormal; Notable for the following components:      Result Value   Pro Brain Natriuretic Peptide 33,608.0 (*)    All other components within normal limits  CBC WITH DIFFERENTIAL/PLATELET - Abnormal; Notable for the following components:   RBC 3.47 (*)    Hemoglobin 8.4 (*)    HCT 27.5 (*)    MCV 79.3 (*)    MCH 24.2 (*)    All other components within normal limits  COMPREHENSIVE METABOLIC PANEL WITH GFR - Abnormal; Notable for the following components:   CO2 21 (*)    Glucose, Bld 103 (*)    BUN 86 (*)    Creatinine, Ser 11.50 (*)    Calcium  8.8 (*)    Total Protein 6.2 (*)    Albumin 3.3 (*)    GFR, Estimated 3 (*)    All other components within normal limits  RETICULOCYTES - Abnormal; Notable for the following components:   RBC. 3.72 (*)    Immature Retic Fract 17.0 (*)    All other components within normal limits  I-STAT VENOUS BLOOD GAS, ED - Abnormal; Notable for the following components:   pCO2, Ven 38.5 (*)    pO2, Ven 93 (*)    Acid-base deficit 4.0 (*)    Calcium , Ion 1.10 (*)    HCT 26.0 (*)    Hemoglobin 8.8 (*)    All other components within normal limits  TROPONIN T, HIGH SENSITIVITY - Abnormal; Notable for the following  components:   Troponin T High Sensitivity 108 (*)    All other components within normal limits  TROPONIN T, HIGH SENSITIVITY - Abnormal; Notable for the following components:   Troponin T High Sensitivity 99 (*)    All other components within normal limits  RESP PANEL BY RT-PCR (RSV, FLU A&B, COVID)  RVPGX2  LEVETIRACETAM  LEVEL  VITAMIN B12  FOLATE  IRON AND TIBC  FERRITIN  CBG  MONITORING, ED    Notable for as above  EKG   EKG Interpretation Date/Time:  Thursday July 11 2023 15:22:24 EDT Ventricular Rate:  89 PR Interval:  150 QRS Duration:  68 QT Interval:  358 QTC Calculation: 435 R Axis:   48  Text Interpretation: Normal sinus rhythm Low voltage QRS Septal infarct , age undetermined Abnormal ECG When compared with ECG of 01-Jan-2023 15:59, PREVIOUS ECG IS PRESENT Confirmed by Elnor Savant ([id number]) on 07/11/2023 4:18:25 PM         Imaging Studies ordered: I ordered imaging studies including cxr ctap I independently visualized the following imaging with scope of interpretation limited to determining acute life threatening conditions related to emergency care; findings noted above I agree with the radiologist interpretation If any imaging was obtained with contrast I closely monitored patient for any possible adverse reaction a/w contrast administration in the emergency department   Medicines ordered and prescription drug management: Meds ordered this encounter  Medications   furosemide  (LASIX ) injection 80 mg   nitroGLYCERIN (NITROSTAT) SL tablet 0.4 mg   DISCONTD: diazepam (VALIUM) tablet 5 mg   diazepam (VALIUM) injection 2.5 mg   nitroGLYCERIN 50 mg in dextrose  5 % 250 mL (0.2 mg/mL) infusion    -I have reviewed the patients home medicines and have made adjustments as needed   Consultations Obtained: I requested consultation with the nephrology,  and discussed lab and imaging findings as well as pertinent plan   Cardiac Monitoring: The patient was maintained on a cardiac monitor.  I personally viewed and interpreted the cardiac monitored which showed an underlying rhythm of: nsr Continuous pulse oximetry interpreted by myself, 84% on RA.    Social Determinants of Health:  Diagnosis or treatment significantly limited by social determinants of health: na   Reevaluation: After the interventions noted above, I reevaluated the patient and  found that they have improved  Co morbidities that complicate the patient evaluation  Past Medical History:  Diagnosis Date   Diabetes (HCC)    Edema    H. pylori infection 06/2019   H. pylori infection 06/2019   Hypertension    Proteinuria 11/2018   Seizures (HCC)       Dispostion: Disposition decision including need for hospitalization was considered, and patient admitted to the hospital.    Final Clinical Impression(s) / ED Diagnoses Final diagnoses:  Acute congestive heart failure, unspecified heart failure type [facility])  Respiratory distress  Renal insufficiency        Elnor Savant LABOR, DO 07/11/23 2017

## 2023-07-11 NOTE — ED Notes (Signed)
 FIO2 decreased to 40%, sats 100%

## 2023-07-11 NOTE — Progress Notes (Signed)
 Informed of patient at [facility] ER. Discussed w/ Dr. Elnor. Patient presents with volume overload and worsening CKD. Was previously followed by Dr. Prescilla but last seen in 2023. 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 Was supposed to go to the ER back in March for worsening renal function but seems that she did not go.  Recommendations: -full consult to follow in AM -recommend aggressive diuresis, can consider lasix  120mg  BID or TID depending on severity of volume status -Likely ESRD at this point therefore, I suspect she will need to start HD during this admission -please call with any questions/concerns  Ephriam Stank, MD [facility]

## 2023-07-12 ENCOUNTER — Encounter (HOSPITAL_COMMUNITY): Payer: Self-pay | Admitting: Student

## 2023-07-12 DIAGNOSIS — J9601 Acute respiratory failure with hypoxia: Secondary | ICD-10-CM

## 2023-07-12 DIAGNOSIS — R0602 Shortness of breath: Secondary | ICD-10-CM | POA: Diagnosis present

## 2023-07-12 DIAGNOSIS — Z833 Family history of diabetes mellitus: Secondary | ICD-10-CM | POA: Diagnosis not present

## 2023-07-12 DIAGNOSIS — I10 Essential (primary) hypertension: Secondary | ICD-10-CM | POA: Diagnosis not present

## 2023-07-12 DIAGNOSIS — R339 Retention of urine, unspecified: Secondary | ICD-10-CM | POA: Diagnosis present

## 2023-07-12 DIAGNOSIS — I132 Hypertensive heart and chronic kidney disease with heart failure and with stage 5 chronic kidney disease, or end stage renal disease: Secondary | ICD-10-CM | POA: Diagnosis present

## 2023-07-12 DIAGNOSIS — Z794 Long term (current) use of insulin: Secondary | ICD-10-CM | POA: Diagnosis not present

## 2023-07-12 DIAGNOSIS — I5043 Acute on chronic combined systolic (congestive) and diastolic (congestive) heart failure: Secondary | ICD-10-CM | POA: Diagnosis present

## 2023-07-12 DIAGNOSIS — Z79899 Other long term (current) drug therapy: Secondary | ICD-10-CM | POA: Diagnosis not present

## 2023-07-12 DIAGNOSIS — D509 Iron deficiency anemia, unspecified: Secondary | ICD-10-CM | POA: Diagnosis present

## 2023-07-12 DIAGNOSIS — I5032 Chronic diastolic (congestive) heart failure: Secondary | ICD-10-CM

## 2023-07-12 DIAGNOSIS — Z8669 Personal history of other diseases of the nervous system and sense organs: Secondary | ICD-10-CM | POA: Insufficient documentation

## 2023-07-12 DIAGNOSIS — R188 Other ascites: Secondary | ICD-10-CM | POA: Diagnosis present

## 2023-07-12 DIAGNOSIS — N185 Chronic kidney disease, stage 5: Secondary | ICD-10-CM | POA: Diagnosis not present

## 2023-07-12 DIAGNOSIS — Z8249 Family history of ischemic heart disease and other diseases of the circulatory system: Secondary | ICD-10-CM | POA: Diagnosis not present

## 2023-07-12 DIAGNOSIS — I5033 Acute on chronic diastolic (congestive) heart failure: Secondary | ICD-10-CM | POA: Diagnosis not present

## 2023-07-12 DIAGNOSIS — D631 Anemia in chronic kidney disease: Secondary | ICD-10-CM | POA: Diagnosis present

## 2023-07-12 DIAGNOSIS — G40909 Epilepsy, unspecified, not intractable, without status epilepticus: Secondary | ICD-10-CM | POA: Diagnosis present

## 2023-07-12 DIAGNOSIS — E1136 Type 2 diabetes mellitus with diabetic cataract: Secondary | ICD-10-CM | POA: Diagnosis present

## 2023-07-12 DIAGNOSIS — M7989 Other specified soft tissue disorders: Secondary | ICD-10-CM | POA: Diagnosis present

## 2023-07-12 DIAGNOSIS — N179 Acute kidney failure, unspecified: Secondary | ICD-10-CM | POA: Diagnosis present

## 2023-07-12 DIAGNOSIS — E1165 Type 2 diabetes mellitus with hyperglycemia: Secondary | ICD-10-CM

## 2023-07-12 DIAGNOSIS — H259 Unspecified age-related cataract: Secondary | ICD-10-CM | POA: Diagnosis present

## 2023-07-12 DIAGNOSIS — Z1152 Encounter for screening for COVID-19: Secondary | ICD-10-CM | POA: Diagnosis not present

## 2023-07-12 DIAGNOSIS — J9811 Atelectasis: Secondary | ICD-10-CM | POA: Diagnosis present

## 2023-07-12 DIAGNOSIS — Z7982 Long term (current) use of aspirin: Secondary | ICD-10-CM | POA: Diagnosis not present

## 2023-07-12 DIAGNOSIS — N186 End stage renal disease: Secondary | ICD-10-CM | POA: Diagnosis present

## 2023-07-12 DIAGNOSIS — N184 Chronic kidney disease, stage 4 (severe): Secondary | ICD-10-CM | POA: Diagnosis not present

## 2023-07-12 DIAGNOSIS — E1122 Type 2 diabetes mellitus with diabetic chronic kidney disease: Secondary | ICD-10-CM | POA: Diagnosis present

## 2023-07-12 LAB — HIV ANTIBODY (ROUTINE TESTING W REFLEX): HIV Screen 4th Generation wRfx: NONREACTIVE

## 2023-07-12 LAB — CREATININE, SERUM
Creatinine, Ser: 11.04 mg/dL — ABNORMAL HIGH (ref 0.44–1.00)
GFR, Estimated: 4 mL/min — ABNORMAL LOW (ref >60)

## 2023-07-12 LAB — LEVETIRACETAM LEVEL: Levetiracetam Lvl: 26.6 ug/mL (ref 10.0–40.0)

## 2023-07-12 LAB — CBG MONITORING, ED: Glucose-Capillary: 81 mg/dL (ref 70–99)

## 2023-07-12 MED ORDER — FUROSEMIDE 10 MG/ML IJ SOLN
120.0000 mg | Freq: Three times a day (TID) | INTRAVENOUS | Status: DC
  Administered 2023-07-12 – 2023-07-17 (×15): 120 mg via INTRAVENOUS
  Filled 2023-07-12: qty 2
  Filled 2023-07-12 (×3): qty 10
  Filled 2023-07-12: qty 12
  Filled 2023-07-12: qty 10
  Filled 2023-07-12: qty 2
  Filled 2023-07-12: qty 10
  Filled 2023-07-12: qty 12
  Filled 2023-07-12: qty 100
  Filled 2023-07-12: qty 10
  Filled 2023-07-12 (×3): qty 12
  Filled 2023-07-12: qty 10
  Filled 2023-07-12: qty 4
  Filled 2023-07-12: qty 2

## 2023-07-12 MED ORDER — HEPARIN SODIUM (PORCINE) 5000 UNIT/ML IJ SOLN
5000.0000 [IU] | Freq: Three times a day (TID) | INTRAMUSCULAR | Status: DC
  Administered 2023-07-12 – 2023-07-18 (×18): 5000 [IU] via SUBCUTANEOUS
  Filled 2023-07-12 (×17): qty 1

## 2023-07-12 MED ORDER — ACETAMINOPHEN 325 MG PO TABS
650.0000 mg | ORAL_TABLET | Freq: Four times a day (QID) | ORAL | Status: DC | PRN
  Administered 2023-07-14 – 2023-07-18 (×4): 650 mg via ORAL
  Filled 2023-07-12 (×4): qty 2

## 2023-07-12 MED ORDER — INSULIN ASPART 100 UNIT/ML IJ SOLN
0.0000 [IU] | Freq: Every day | INTRAMUSCULAR | Status: DC
  Administered 2023-07-13 – 2023-07-15 (×3): 2 [IU] via SUBCUTANEOUS

## 2023-07-12 MED ORDER — IRON SUCROSE 200 MG IVPB - SIMPLE MED
200.0000 mg | Status: DC
  Administered 2023-07-12: 200 mg via INTRAVENOUS
  Filled 2023-07-12 (×2): qty 110

## 2023-07-12 MED ORDER — HYDRALAZINE HCL 20 MG/ML IJ SOLN: 5.0000 mg | Freq: Four times a day (QID) | INTRAMUSCULAR | Status: DC | PRN

## 2023-07-12 MED ORDER — METOLAZONE 5 MG PO TABS
5.0000 mg | ORAL_TABLET | Freq: Once | ORAL | Status: AC
  Administered 2023-07-12: 5 mg via ORAL
  Filled 2023-07-12: qty 1

## 2023-07-12 MED ORDER — LEVETIRACETAM 500 MG PO TABS
500.0000 mg | ORAL_TABLET | Freq: Two times a day (BID) | ORAL | Status: DC
  Administered 2023-07-12 – 2023-07-18 (×12): 500 mg via ORAL
  Filled 2023-07-12 (×12): qty 1

## 2023-07-12 MED ORDER — POLYETHYLENE GLYCOL 3350 17 G PO PACK: 17.0000 g | PACK | Freq: Every day | ORAL | Status: DC | PRN

## 2023-07-12 MED ORDER — AMLODIPINE BESYLATE 5 MG PO TABS
5.0000 mg | ORAL_TABLET | Freq: Every day | ORAL | Status: DC
  Administered 2023-07-13 – 2023-07-14 (×2): 5 mg via ORAL
  Filled 2023-07-12 (×2): qty 1

## 2023-07-12 MED ORDER — INSULIN ASPART 100 UNIT/ML IJ SOLN
0.0000 [IU] | Freq: Three times a day (TID) | INTRAMUSCULAR | Status: DC
  Administered 2023-07-13: 2 [IU] via SUBCUTANEOUS
  Administered 2023-07-13 – 2023-07-14 (×2): 1 [IU] via SUBCUTANEOUS
  Administered 2023-07-14 – 2023-07-15 (×2): 2 [IU] via SUBCUTANEOUS
  Administered 2023-07-15 – 2023-07-16 (×2): 1 [IU] via SUBCUTANEOUS
  Administered 2023-07-16: 4 [IU] via SUBCUTANEOUS
  Administered 2023-07-16 – 2023-07-17 (×3): 1 [IU] via SUBCUTANEOUS
  Administered 2023-07-17: 3 [IU] via SUBCUTANEOUS
  Administered 2023-07-18: 1 [IU] via SUBCUTANEOUS

## 2023-07-12 MED ORDER — ACETAMINOPHEN 650 MG RE SUPP: 650.0000 mg | Freq: Four times a day (QID) | RECTAL | Status: DC | PRN

## 2023-07-12 MED ORDER — SODIUM CHLORIDE 0.9% FLUSH
3.0000 mL | Freq: Two times a day (BID) | INTRAVENOUS | Status: DC
  Administered 2023-07-12 – 2023-07-17 (×9): 3 mL via INTRAVENOUS

## 2023-07-12 NOTE — ED Notes (Signed)
 Attempted to call report, nurse not available, sent message she would call back.

## 2023-07-12 NOTE — ED Notes (Signed)
 Pt ask for a another pillow to support her neck, provided.

## 2023-07-12 NOTE — ED Notes (Signed)
 Fall risk bundle is currently in place.

## 2023-07-12 NOTE — Plan of Care (Signed)
 Notified by EDP that patient is stable of off BIPAP. Vitals stable as well. Patient was accepted to progressive bed last night. EDP asking if she can be changed to less acute bed so she can be transferred to [facility] quicker. Changed from progressive to medical tele.  See plan of care note by Dr. Alfornia

## 2023-07-12 NOTE — ED Notes (Signed)
 Report called to Luke Meissner, RN; nurse

## 2023-07-12 NOTE — Progress Notes (Signed)
 Report received from Devere ORN, RN.

## 2023-07-12 NOTE — ED Notes (Signed)
 RT removed BP for trial. Pt states she is breathing much better. Pt respiratory status is stable on RA at this time w/no distress noted or increase in WOB. RT will continue to monitor while at [facility].

## 2023-07-12 NOTE — ED Notes (Signed)
 Foley bag emptied of clear yellow urine.

## 2023-07-12 NOTE — Progress Notes (Signed)
 Patient trialed off 2L. [name]. When on room air patient oxygen saturation is 87%. 2L. [name]. re-applied and oxygen saturation 92% or greater.

## 2023-07-12 NOTE — ED Notes (Signed)
 Verbal order received for 1 nitroglycerin  paste topical to left chest wall, applied as directed.  Nitroglycerin  drip discontinued per order.

## 2023-07-12 NOTE — ED Notes (Signed)
 Called [facility] for transport, pt bed assignment is ready

## 2023-07-12 NOTE — Consult Note (Signed)
 [facility] Nephrology Consultation Note  Requesting MD: Dr. Gonfa, Taye Reason for consult: CKD with fluid overload.  HPI:  Jill Jefferson is a 63 y.o. female with past medical history significant for hypertension, diabetes, CKD 5 with poor outpatient follow-up presented with worsening shortness of breath, peripheral edema for last few weeks, seen as a consultation for the management of CKD with fluid overload. It seems like the patient has longstanding CKD with proteinuria thought to be due to diabetic nephropathy.  She used to go to [place] but not since 2023.  The creatinine level was around 6.8 with BUN 75 back in 03/2023.  Now patient came with generalized weakness, shortness of breath, dyspnea on exertion and worsening lower extremity edema.  The family including her daughter advised her to go to the ER.  The patient went to [facility] where the initial labs showed BUN 86, creatinine 11.5, potassium 4.4, CO2 21, albumin 3.3, proBNP 33,000, iron  saturation 5% with hemoglobin around 8.  The CT scan of abdomen pelvis showed moderate bilateral pleural effusion with third spacing of fluid and renal atrophy.  Chest x-ray consistent with pleural effusion and atelectasis.  The patient initially required BiPAP, received IV diuretics in the ER.  Currently off of BiPAP and on nasal cannula.  Patient was transferred to [facility] for further care. She denies nausea, vomiting, dysgeusia however reports dyspnea on exertion with minimal walking, generalized fatigue and concern about weight gain and swelling. She is supposed to be on furosemide  orally at home but not sure she is really taking it. The patient is hesitant to start dialysis and she wants to try high-dose diuretics before initiating HD.  PMHx:   Past Medical History:  Diagnosis Date   Diabetes (HCC)    Edema    H. pylori infection 06/2019   H. pylori infection 06/2019   Hypertension    Proteinuria 11/2018    Seizures (HCC)     Past Surgical History:  Procedure Laterality Date   BRAIN SURGERY Left    1990's   CESAREAN SECTION     history of 4 c-sections    Family Hx:  Family History  Problem Relation Age of Onset   Diabetes Mother    Hypertension Mother     Social History:  reports that she has never smoked. She has never used smokeless tobacco. She reports that she does not drink alcohol and does not use drugs.  Allergies: No Known Allergies  Medications: Prior to Admission medications   Medication Sig Start Date End Date Taking? Authorizing Provider  furosemide  (LASIX ) 20 MG tablet Take 20 mg by mouth 2 (two) times daily. 06/12/23  Yes [provider]  amLODipine  (NORVASC ) 5 MG tablet Take 1 tablet (5 mg total) by mouth daily. 04/02/23   Almarie Waddell [name], NP  aspirin  EC 81 MG tablet Take 81 mg by mouth daily. Swallow whole.    [provider]  hydrALAZINE  (APRESOLINE ) 10 MG tablet Take 1 tablet (10 mg total) by mouth 3 (three) times daily. 04/02/23   Almarie Waddell [name], NP  insulin  degludec (TRESIBA  FLEXTOUCH) 100 UNIT/ML FlexTouch Pen inject 10 units every evening Patient taking differently: Inject 10-14 Units into the skin every evening. Inject 10-14 units with evening meal depending on blood sugar. 12/11/21     Insulin  Pen Needle (BD PEN NEEDLE NANO U/F) 32G X 4 MM MISC for insulin  injections qdDx: E11.65 on insulin  06/08/21     levETIRAcetam  (KEPPRA ) 500 MG tablet  Take 1 tablet (500 mg total) by mouth 2 (two) times daily. 04/02/23   Almarie Waddell [name], NP    I have reviewed the patient's current medications.  Labs: Renal Panel: Recent Labs  Lab 07/11/23 1625 07/11/23 1632  NA 141 140  K 4.4 4.3  CL 107  --   CO2 21*  --   GLUCOSE 103*  --   BUN 86*  --   CREATININE 11.50*  --   CALCIUM  8.8*  --      CBC:    Latest Ref Rng & Units 07/11/2023    4:32 PM 07/11/2023    4:25 PM 04/02/2023   10:32 AM  CBC  WBC 4.0 - 10.5 K/uL  6.8  5.1   Hemoglobin 12.0 - 15.0  g/dL 8.8  8.4  89.4   Hematocrit 36.0 - 46.0 % 26.0  27.5  32.7   Platelets 150 - 400 K/uL  293  242.0      Anemia Panel:  Recent Labs    01/01/23 1601 04/02/23 1032 07/11/23 1625 07/11/23 1632 07/11/23 1953  HGB 12.3 10.5* 8.4* 8.8*  --   MCV 88.0 83.2 79.3*  --   --   VITAMINB12  --   --   --   --  1,362*  FOLATE  --   --   --   --  16.8  FERRITIN  --   --   --   --  41  TIBC  --   --   --   --  321  IRON   --   --   --   --  17*  RETICCTPCT  --   --   --   --  1.6    Recent Labs  Lab 07/11/23 1625  AST 24  ALT 32  ALKPHOS 79  BILITOT 0.2  PROT 6.2*  ALBUMIN 3.3*    Lab Results  Component Value Date   HGBA1C 7.4 (H) 04/02/2023    ROS:  Pertinent items noted in HPI and remainder of comprehensive ROS otherwise negative.  Physical Exam: Vitals:   07/12/23 1330 07/12/23 1425  BP: (!) 156/83 (!) 160/84  Pulse:  92  Resp:  16  Temp:    SpO2:  95%     General exam: Looks comfortable while sitting, on nasal cannula oxygen. Respiratory system: Bibasilar reduced breath sound, no wheezing. Cardiovascular system: S1 & S2 heard, RRR.  No rub Gastrointestinal system: Abdomen is soft and nontender. Central nervous system: Alert and oriented. No focal neurological deficits. Extremities: Significant upper and lower extremities pitting edema+++ Skin: No rashes, lesions or ulcers Psychiatry: Judgement and insight appear normal. Mood & affect appropriate.   Assessment/Plan:  # CKD5 with fluid overload and early sign of uremia: Patient with longstanding CKD thought to be due to diabetic nephropathy.  She has poor outpatient follow-up, last seen by nephrologist in 2023. We discussed about starting dialysis after placement of HD catheter, permanent dialysis access etc. at length.  Patient is very hesitant to start dialysis however willing to try high-dose IV diuretics first.  She would like to see the response with diuretics before initiating dialysis. I am going to start  her on furosemide  IV and metoalzone, strict I/O and monitor lab.  She understands that she will need dialysis if no improvement in her renal function with diuretics.  # Acute on chronic diastolic CHF with fluid overload: Elevated BNP level.  Starting diuretics as above.  # Hypertension/volume: Diuretics should help  optimize blood pressure.  May need dialysis soon.  # CKD-MBD: Check phosphorus, PTH level.  # Anemia of chronic disease/iron  deficiency anemia: Iron  saturation 5% with serum iron  of 17 and ferritin 41.  I am ordering IV Venofer .  Will order Aranesp  after at least a dose of IV iron .  Discussed with the nursing staff.   Lakaisha Danish Amelie Romney 07/12/2023, 2:48 PM  [facility].

## 2023-07-12 NOTE — ED Notes (Signed)
 Foley emptied of clear yellow urine, prior to departure

## 2023-07-12 NOTE — Progress Notes (Signed)
 Baseline circumference measurements to assist in monitoring IV site changes.   #20 anterior left upper arm IV placed on 07/11/2023: arm circumference measures 30cm.   #20 anterior left forearm IV placed on 07/11/2023: arm circumference measures 29cm.

## 2023-07-12 NOTE — ED Provider Notes (Addendum)
 Patient now stable off of BiPAP.  I have left the inpatient team now.  We have discontinued the nitro drip as systolic blood pressure has remained in 140s.   Pamella Ozell LABOR, DO 07/12/23 1247    Pamella Ozell LABOR, DO 07/12/23 1326

## 2023-07-12 NOTE — H&P (Signed)
 History and Physical   Jill Jefferson FMW:[id number] DOB: 03/19/1960 DOA: 07/11/2023  PCP: Almarie Waddell [name], NP   Patient coming from: Home  Chief Complaint: Shortness of breath, hypoglycemia  HPI: Jill Jefferson is a 63 y.o. female with medical history significant of hypertension, CKD 4/5, seizure disorder, cataract, diastolic CHF presenting with shortness of breath and hypoglycemia.  [facility] with 2 weeks of worsening shortness of breath that was even worse on exertion.  Also noted to have worsening edema of lower extremities and abdomen.  Reports weight gain of 10 pounds and orthopnea as well.  Has followed with nephrology in the past but intermittently and has not been seen in a while.  Denies fevers, chills, chest pain, abdominal pain, constipation, diarrhea, nausea, vomiting.  ED Course: Vital signs in the ED notable for blood pressure in the 130s-190 systolic, initially hypoxic requiring BiPAP and then weaned to 6 L.  Now on 2L  Lab workup in the ED notable for CMP with bicarb 21, BUN 86, creatinine 11.5 increased from 6.8 three months ago and 4 six months ago.  CBC with hemoglobin 8.4 down from 10-11 at baseline.  proBNP elevated to 33,608.  Troponin T flat at 108, 99.  Respiratory panel for flu COVID RSV negative.  Keppra  level normal.  VBG with pH normal but pCO2 38.5.  B12 normal, folate normal, iron  17, ferritin low normal at 41.  Reticulocyte count inappropriately normal.  Chest x-ray showed increased left moderate and right mild effusions.  CTA abdomen pelvis showed moderate bilateral pulmonary effusions and changes consistent with third spacing with generalized subcutaneous edema.  Trace ascites also noted.  Patient received 40 mg IV Lasix , nitro tablet, Valium  in the ED.  Nephrology consulted recommended 120 mg IV Lasix  twice daily to 3 times daily while patient was awaiting transfer. (High dose Lasix  not yet received)  Review of  Systems: As per HPI otherwise all other systems reviewed and are negative.  Past Medical History:  Diagnosis Date   Diabetes (HCC)    Edema    Focal neurological deficit 09/01/2020   H. pylori infection 06/2019   H. pylori infection 06/2019   Hyperosmolar hyperglycemic state (HHS) (HCC) 09/01/2020   Hypertension    Proteinuria 11/2018   Seizures (HCC)    Status epilepticus (HCC) 05/10/2021    Past Surgical History:  Procedure Laterality Date   BRAIN SURGERY Left    1990's   CESAREAN SECTION     history of 4 c-sections    Social History  reports that she has never smoked. She has never used smokeless tobacco. She reports that she does not drink alcohol and does not use drugs.  No Known Allergies  Family History  Problem Relation Age of Onset   Diabetes Mother    Hypertension Mother   Reviewed on admission  Prior to Admission medications   Medication Sig Start Date End Date Taking? Authorizing Provider  amLODipine  (NORVASC ) 5 MG tablet Take 1 tablet (5 mg total) by mouth daily. 04/02/23   Almarie Waddell [name], NP  aspirin  EC 81 MG tablet Take 81 mg by mouth daily. Swallow whole.    [provider]  furosemide  (LASIX ) 20 MG tablet Take 20 mg by mouth 2 (two) times daily. 06/12/23   [provider]  hydrALAZINE  (APRESOLINE ) 10 MG tablet Take 1 tablet (10 mg total) by mouth 3 (three) times daily. 04/02/23   Almarie Waddell [name], NP  insulin  degludec (TRESIBA   FLEXTOUCH) 100 UNIT/ML FlexTouch Pen inject 10 units every evening Patient taking differently: Inject 10-14 Units into the skin every evening. Inject 10-14 units with evening meal depending on blood sugar. 12/11/21     Insulin  Pen Needle (BD PEN NEEDLE NANO U/F) 32G X 4 MM MISC for insulin  injections qdDx: E11.65 on insulin  06/08/21     levETIRAcetam  (KEPPRA ) 500 MG tablet Take 1 tablet (500 mg total) by mouth 2 (two) times daily. 04/02/23   Almarie Waddell [name], NP    Physical Exam: Vitals:   07/12/23 1300 07/12/23 1330  07/12/23 1425 07/12/23 1508  BP: (!) 147/86 (!) 156/83 (!) 160/84   Pulse: 82  92 89  Resp: 16  16 16   Temp: 97.7 F (36.5 C)   97.7 F (36.5 C)  TempSrc: Oral   Oral  SpO2: 95%  95%   Weight:   71.1 kg   Height:    5' (1.524 m)    Physical Exam Constitutional:      General: She is not in acute distress.    Appearance: Normal appearance.  HENT:     Head: Normocephalic and atraumatic.     Mouth/Throat:     Mouth: Mucous membranes are moist.     Pharynx: Oropharynx is clear.  Eyes:     Extraocular Movements: Extraocular movements intact.     Pupils: Pupils are equal, round, and reactive to light.  Cardiovascular:     Rate and Rhythm: Normal rate and regular rhythm.     Pulses: Normal pulses.     Heart sounds: Normal heart sounds.  Pulmonary:     Effort: Pulmonary effort is normal. No respiratory distress.     Breath sounds: Rales present.  Abdominal:     General: Bowel sounds are normal. There is no distension.     Palpations: Abdomen is soft.     Tenderness: There is no abdominal tenderness.  Musculoskeletal:        General: No swelling or deformity.     Right lower leg: Edema present.     Left lower leg: Edema present.  Skin:    General: Skin is warm and dry.  Neurological:     General: No focal deficit present.     Mental Status: Mental status is at baseline.    Labs on Admission: I have personally reviewed following labs and imaging studies  CBC: Recent Labs  Lab 07/11/23 1625 07/11/23 1632  WBC 6.8  --   NEUTROABS 5.6  --   HGB 8.4* 8.8*  HCT 27.5* 26.0*  MCV 79.3*  --   PLT 293  --     Basic Metabolic Panel: Recent Labs  Lab 07/11/23 1625 07/11/23 1632  NA 141 140  K 4.4 4.3  CL 107  --   CO2 21*  --   GLUCOSE 103*  --   BUN 86*  --   CREATININE 11.50*  --   CALCIUM  8.8*  --     GFR: Estimated Creatinine Clearance: 4.5 mL/min (A) (by C-G formula based on SCr of 11.5 mg/dL (H)).  Liver Function Tests: Recent Labs  Lab 07/11/23 1625   AST 24  ALT 32  ALKPHOS 79  BILITOT 0.2  PROT 6.2*  ALBUMIN 3.3*    Urine analysis:    Component Value Date/Time   COLORURINE STRAW (A) 01/01/2023 1601   APPEARANCEUR CLEAR 01/01/2023 1601   LABSPEC 1.010 01/01/2023 1601   PHURINE 7.0 01/01/2023 1601   GLUCOSEU 50 (A) 01/01/2023 1601  HGBUR SMALL (A) 01/01/2023 1601   BILIRUBINUR NEGATIVE 01/01/2023 1601   BILIRUBINUR neg 06/23/2019 1148   KETONESUR NEGATIVE 01/01/2023 1601   PROTEINUR >=300 (A) 01/01/2023 1601   UROBILINOGEN 0.2 06/23/2019 1148   NITRITE NEGATIVE 01/01/2023 1601   LEUKOCYTESUR NEGATIVE 01/01/2023 1601    Radiological Exams on Admission: CT ABDOMEN PELVIS WO CONTRAST Result Date: 07/11/2023 CLINICAL DATA:  Acute kidney failure. EXAM: CT ABDOMEN AND PELVIS WITHOUT CONTRAST TECHNIQUE: Multidetector CT imaging of the abdomen and pelvis was performed following the standard protocol without IV contrast. RADIATION DOSE REDUCTION: This exam was performed according to the departmental dose-optimization program which includes automated exposure control, adjustment of the mA and/or kV according to patient size and/or use of iterative reconstruction technique. COMPARISON:  Renal ultrasound 04/24/2023 FINDINGS: Lower chest: Moderate bilateral pleural effusions with associated atelectasis. The heart is upper normal in size. Decreased density of the blood pool suggests anemia. Hepatobiliary: Elongated right lobe of the liver. No evidence of focal liver lesion on this unenhanced exam. Gallbladder physiologically distended, no calcified stone. No biliary dilatation. Pancreas: No ductal dilatation or inflammation. Spleen: Normal in size without focal abnormality. Adrenals/Urinary Tract: No adrenal nodule. Right renal atrophy. No hydronephrosis. Small bilateral renal cysts. No further follow-up imaging is recommended. No renal calculi. Partially distended urinary bladder, no wall thickening for degree of distension. Stomach/Bowel: No  bowel obstruction or inflammatory change. Moderate colonic stool burden. Normal appendix. Vascular/Lymphatic: Aortic and branch atherosclerosis. No aortic aneurysm. No bulky abdominopelvic adenopathy. Reproductive: Uterus and bilateral adnexa are unremarkable. Other: There is marked generalized subcutaneous edema, confluent in the flanks. Mild edema of the intra-abdominal fat. Trace ascites in the pelvis. No free air. Minimal fat containing umbilical hernia. Musculoskeletal: There are no acute or suspicious osseous abnormalities. IMPRESSION: 1. Moderate bilateral pleural effusions with associated atelectasis. 2. Findings suggestive of third spacing with marked generalized subcutaneous edema, confluent in the flanks. Mild edema of the intra-abdominal fat. Trace ascites in the pelvis. 3. Right renal atrophy. No hydronephrosis. Aortic Atherosclerosis (ICD10-I70.0). Electronically Signed   By: Andrea Gasman M.D.   On: 07/11/2023 19:55   DG Chest Port 1 View Result Date: 07/11/2023 CLINICAL DATA:  2nd leg now spread to whole body.  On daily 6. EXAM: PORTABLE CHEST 1 VIEW COMPARISON:  Chest radiographs 06/12/2023 and 09/04/2020 FINDINGS: Interval increase in now moderate left and mild right basilar homogeneous opacities likely representing pleural effusions with associated likely atelectasis. No pneumothorax is seen. The visualized cardiac silhouette is unchanged and again may be mildly enlarged. No acute skeletal abnormality. IMPRESSION: Interval increase in now moderate left and mild right basilar homogeneous opacities likely representing pleural effusions with associated likely atelectasis. Electronically Signed   By: Tanda Lyons M.D.   On: 07/11/2023 16:30   EKG: Independently reviewed.  Sinus rhythm at 89 bpm.  Low voltage in multiple leads.  Nonspecific T wave changes.  Baseline artifact and wander in V4.  Mild baseline wander otherwise.  Assessment/Plan Principal Problem:   Acute renal failure  superimposed on stage 4 chronic kidney disease (HCC) Active Problems:   Uncontrolled type 2 diabetes mellitus with hyperglycemia, with long-term current use of insulin  (HCC)   Age-related cataract of both eyes   Seizure disorder (HCC)   Hypertension   Acute respiratory failure with hypoxia (HCC)   Acute respiratory failure with hypoxia Volume overload AKI on CKD 4/5 > Patient presenting with worsening shortness of breath and edema.  Known history of CKD 4/5. > Noted to initially required  BiPAP, then weaned to 6 L of oxygen, now 2L.  Significant edema and volume overload on exam. > Creatinine 6 months ago was 4 and 3 months ago creatinine was 6.8.  Now creatinine noted to be 11.5. > AKI-concern for progression to ESRD. > Has been seen by nephrology and at this time is hesitant to start on dialysis.  Would like like trial of high-dose diuretics first and then will rediscuss. - Monitor on telemetry - Continue with high-dose Lasix  and metolazone  per nephrology - Trend renal function and electrolytes - Continue supplemental oxygen, wean as tolerated  Acute on chronic diastolic CHF > In setting of worsening renal function and volume overload as above. > Echo in 2022 with EF 65%, G1 DD, normal RV function. - Echocardiogram - Trend renal function and electrolytes - Strict I's and O's, daily weights - Diuresis as above  Hypertension - Continue home amlodipine  - Diuretic as above - Hold hydralazine  with high dose diuretics  Seizure disorder - Continue home Keppra   DVT prophylaxis: Heparin  Code Status:   Full Family Communication:  None on admission  Disposition Plan:   Patient is from:  Home  Anticipated DC to:  Home  Anticipated DC date:  4 to 7 days  Anticipated DC barriers: None  Consults called:  Nephrology Admission status:  Inpatient, telemetry  Severity of Illness: The appropriate patient status for this patient is INPATIENT. Inpatient status is judged to be reasonable  and necessary in order to provide the required intensity of service to ensure the patient's safety. The patient's presenting symptoms, physical exam findings, and initial radiographic and laboratory data in the context of their chronic comorbidities is felt to place them at high risk for further clinical deterioration. Furthermore, it is not anticipated that the patient will be medically stable for discharge from the hospital within 2 midnights of admission.   * I certify that at the point of admission it is my clinical judgment that the patient will require inpatient hospital care spanning beyond 2 midnights from the point of admission due to high intensity of service, high risk for further deterioration and high frequency of surveillance required.Jill Marsa KATHEE Seena MD [facility]  How to contact the [facility] Attending or Consulting provider 7A - 7P or covering provider during after hours 7P -7A, for this patient?   Check the care team in [facility] and look for a) attending/consulting [facility] provider listed and b) the [facility] team listed Log into [email or web address] and use [place]'s universal password to access. If you do not have the password, please contact the hospital operator. Locate the [facility] provider you are looking for under [facility] and page to a number that you can be directly reached. If you still have difficulty reaching the provider, please page the [facility] (Director on Call) for the Hospitalists listed on amion for assistance.  07/12/2023, 4:09 PM

## 2023-07-13 ENCOUNTER — Inpatient Hospital Stay (HOSPITAL_COMMUNITY)

## 2023-07-13 DIAGNOSIS — I5033 Acute on chronic diastolic (congestive) heart failure: Secondary | ICD-10-CM | POA: Diagnosis not present

## 2023-07-13 DIAGNOSIS — N184 Chronic kidney disease, stage 4 (severe): Secondary | ICD-10-CM | POA: Diagnosis not present

## 2023-07-13 DIAGNOSIS — N179 Acute kidney failure, unspecified: Secondary | ICD-10-CM | POA: Diagnosis not present

## 2023-07-13 LAB — RENAL FUNCTION PANEL
Albumin: 2.1 g/dL — ABNORMAL LOW (ref 3.5–5.0)
Anion gap: 9 (ref 5–15)
BUN: 84 mg/dL — ABNORMAL HIGH (ref 8–23)
CO2: 22 mmol/L (ref 22–32)
Calcium: 7.9 mg/dL — ABNORMAL LOW (ref 8.9–10.3)
Chloride: 106 mmol/L (ref 98–111)
Creatinine, Ser: 11.2 mg/dL — ABNORMAL HIGH (ref 0.44–1.00)
GFR, Estimated: 4 mL/min — ABNORMAL LOW (ref >60)
Glucose, Bld: 143 mg/dL — ABNORMAL HIGH (ref 70–99)
Phosphorus: 6.2 mg/dL — ABNORMAL HIGH (ref 2.5–4.6)
Potassium: 3.9 mmol/L (ref 3.5–5.1)
Sodium: 137 mmol/L (ref 135–145)

## 2023-07-13 LAB — CBC
HCT: 26.3 % — ABNORMAL LOW (ref 36.0–46.0)
Hemoglobin: 8.2 g/dL — ABNORMAL LOW (ref 12.0–15.0)
MCH: 24.9 pg — ABNORMAL LOW (ref 26.0–34.0)
MCHC: 31.2 g/dL (ref 30.0–36.0)
MCV: 79.9 fL — ABNORMAL LOW (ref 80.0–100.0)
Platelets: 289 K/uL (ref 150–400)
RBC: 3.29 MIL/uL — ABNORMAL LOW (ref 3.87–5.11)
RDW: 14.2 % (ref 11.5–15.5)
WBC: 6.8 K/uL (ref 4.0–10.5)
nRBC: 0 % (ref 0.0–0.2)

## 2023-07-13 LAB — ECHOCARDIOGRAM COMPLETE
Area-P 1/2: 5.97 cm2
Calc EF: 52.3 %
Height: 60 in
S' Lateral: 3 cm
Single Plane A2C EF: 56 %
Single Plane A4C EF: 45.6 %
Weight: 2458.57 [oz_av]

## 2023-07-13 LAB — GLUCOSE, CAPILLARY
Glucose-Capillary: 128 mg/dL — ABNORMAL HIGH (ref 70–99)
Glucose-Capillary: 185 mg/dL — ABNORMAL HIGH (ref 70–99)
Glucose-Capillary: 197 mg/dL — ABNORMAL HIGH (ref 70–99)
Glucose-Capillary: 200 mg/dL — ABNORMAL HIGH (ref 70–99)
Glucose-Capillary: 209 mg/dL — ABNORMAL HIGH (ref 70–99)
Glucose-Capillary: 237 mg/dL — ABNORMAL HIGH (ref 70–99)

## 2023-07-13 MED ORDER — CHLORHEXIDINE GLUCONATE CLOTH 2 % EX PADS
6.0000 | MEDICATED_PAD | Freq: Every day | CUTANEOUS | Status: DC
  Administered 2023-07-13 – 2023-07-16 (×4): 6 via TOPICAL

## 2023-07-13 MED ORDER — SODIUM CHLORIDE 0.9 % IV SOLN
200.0000 mg | INTRAVENOUS | Status: AC
  Administered 2023-07-13 – 2023-07-14 (×2): 200 mg via INTRAVENOUS
  Filled 2023-07-13 (×2): qty 10

## 2023-07-13 MED ORDER — DARBEPOETIN ALFA 100 MCG/0.5ML IJ SOSY
100.0000 ug | PREFILLED_SYRINGE | INTRAMUSCULAR | Status: DC
  Administered 2023-07-13: 100 ug via SUBCUTANEOUS
  Filled 2023-07-13 (×2): qty 0.5

## 2023-07-13 MED ORDER — METOLAZONE 5 MG PO TABS
5.0000 mg | ORAL_TABLET | Freq: Once | ORAL | Status: AC
  Administered 2023-07-13: 5 mg via ORAL
  Filled 2023-07-13: qty 1

## 2023-07-13 NOTE — Plan of Care (Signed)
   Problem: Education: Goal: Knowledge of General Education information will improve Description Including pain rating scale, medication(s)/side effects and non-pharmacologic comfort measures Outcome: Progressing

## 2023-07-13 NOTE — Progress Notes (Signed)
 During AM assessment, patient's left arm noted to be swollen.  Alan RN (who admitted and assessed patient on 07/12/23) came to assess, she states measurements are the same as yesterday but hand is more swollen.    Dr. Fairy notified via secure chat and IV team to assess.

## 2023-07-13 NOTE — Progress Notes (Signed)
 [facility] NEPHROLOGY PROGRESS NOTE  Assessment/ Plan: Pt is a 63 y.o. yo female  with PMH significant for hypertension, diabetes, CKD 5 with poor outpatient follow-up presented with worsening shortness of breath, peripheral edema for last few weeks.  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  She has poor outpatient follow-up, last seen by nephrologist in 2023. Patient is hesitant to start dialysis therefore attempting high-dose IV diuretics.  She understands that she will most likely need dialysis during this hospitalization. Continue Lasix  IV, metolazone  and strict ins and outs.  Urine output around 2 L however no improvement in renal function.   # Acute on chronic diastolic CHF with fluid overload: Elevated BNP level.  Getting echo today.  Starting diuretics as above.   # Hypertension/volume: Diuretics should help optimize blood pressure.  May need dialysis soon.   # CKD-MBD/hyperphosphatemia: Start sevelamer, PTH level pending.   # Anemia of chronic disease/iron  deficiency anemia: Iron  saturation 5% with serum iron  of 17 and ferritin 41.  Start IV iron  and Aranesp .  Subjective: Seen and examined at bedside.  Getting echocardiogram.  Reports her breathing is better.  Urine output around 2 L.  No nausea, vomiting.  Feels tired.  Objective Vital signs in last 24 hours: Vitals:   07/12/23 2046 07/13/23 0051 07/13/23 0548 07/13/23 0741  BP: (!) 156/87 (!) 160/89 (!) 167/89 (!) 155/91  Pulse: 87 86 88 90  Resp: 17 15 16 17   Temp: 98.5 F (36.9 C) 98.8 F (37.1 C) 98.4 F (36.9 C) 98.5 F (36.9 C)  TempSrc: Oral Oral Oral Oral  SpO2: 100% 99% 98% 100%  Weight:   69.7 kg   Height:       Weight change:   Intake/Output Summary (Last 24 hours) at 07/13/2023 1112 Last data filed at 07/13/2023 0800 Gross per 24 hour  Intake 866.19 ml  Output 1860 ml  Net -993.81  ml       Labs: RENAL PANEL Recent Labs  Lab 07/11/23 1625 07/11/23 1632 07/12/23 1618 07/13/23 0257  NA 141 140  --  137  K 4.4 4.3  --  3.9  CL 107  --   --  106  CO2 21*  --   --  22  GLUCOSE 103*  --   --  143*  BUN 86*  --   --  84*  CREATININE 11.50*  --  11.04* 11.20*  CALCIUM  8.8*  --   --  7.9*  PHOS  --   --   --  6.2*  ALBUMIN 3.3*  --   --  2.1*    Liver Function Tests: Recent Labs  Lab 07/11/23 1625 07/13/23 0257  AST 24  --   ALT 32  --   ALKPHOS 79  --   BILITOT 0.2  --   PROT 6.2*  --   ALBUMIN 3.3* 2.1*   No results for input(s): LIPASE, AMYLASE in the last 168 hours. No results for input(s): AMMONIA in the last 168 hours. CBC: Recent Labs    01/01/23 1601 04/02/23 1032 07/11/23 1625 07/11/23 1632 07/11/23 1953 07/13/23 0257  HGB 12.3 10.5* 8.4* 8.8*  --  8.2*  MCV 88.0 83.2 79.3*  --   --  79.9*  VITAMINB12  --   --   --   --  1,362*  --   FOLATE  --   --   --   --  16.8  --   FERRITIN  --   --   --   --  41  --   TIBC  --   --   --   --  321  --   IRON   --   --   --   --  17*  --   RETICCTPCT  --   --   --   --  1.6  --     Cardiac Enzymes: No results for input(s): CKTOTAL, CKMB, CKMBINDEX, TROPONINI in the last 168 hours. CBG: Recent Labs  Lab 07/11/23 1517 07/12/23 0453 07/13/23 0008 07/13/23 0733  GLUCAP 93 81 209* 128*    Iron  Studies:  Recent Labs    07/11/23 1953  IRON  17*  TIBC 321  FERRITIN 41   Studies/Results: CT ABDOMEN PELVIS WO CONTRAST Result Date: 07/11/2023 CLINICAL DATA:  Acute kidney failure. EXAM: CT ABDOMEN AND PELVIS WITHOUT CONTRAST TECHNIQUE: Multidetector CT imaging of the abdomen and pelvis was performed following the standard protocol without IV contrast. RADIATION DOSE REDUCTION: This exam was performed according to the departmental dose-optimization program which includes automated exposure control, adjustment of the mA and/or kV according to patient size and/or use of iterative  reconstruction technique. COMPARISON:  Renal ultrasound 04/24/2023 FINDINGS: Lower chest: Moderate bilateral pleural effusions with associated atelectasis. The heart is upper normal in size. Decreased density of the blood pool suggests anemia. Hepatobiliary: Elongated right lobe of the liver. No evidence of focal liver lesion on this unenhanced exam. Gallbladder physiologically distended, no calcified stone. No biliary dilatation. Pancreas: No ductal dilatation or inflammation. Spleen: Normal in size without focal abnormality. Adrenals/Urinary Tract: No adrenal nodule. Right renal atrophy. No hydronephrosis. Small bilateral renal cysts. No further follow-up imaging is recommended. No renal calculi. Partially distended urinary bladder, no wall thickening for degree of distension. Stomach/Bowel: No bowel obstruction or inflammatory change. Moderate colonic stool burden. Normal appendix. Vascular/Lymphatic: Aortic and branch atherosclerosis. No aortic aneurysm. No bulky abdominopelvic adenopathy. Reproductive: Uterus and bilateral adnexa are unremarkable. Other: There is marked generalized subcutaneous edema, confluent in the flanks. Mild edema of the intra-abdominal fat. Trace ascites in the pelvis. No free air. Minimal fat containing umbilical hernia. Musculoskeletal: There are no acute or suspicious osseous abnormalities. IMPRESSION: 1. Moderate bilateral pleural effusions with associated atelectasis. 2. Findings suggestive of third spacing with marked generalized subcutaneous edema, confluent in the flanks. Mild edema of the intra-abdominal fat. Trace ascites in the pelvis. 3. Right renal atrophy. No hydronephrosis. Aortic Atherosclerosis (ICD10-I70.0). Electronically Signed   By: Andrea Gasman M.D.   On: 07/11/2023 19:55   DG Chest Port 1 View Result Date: 07/11/2023 CLINICAL DATA:  2nd leg now spread to whole body.  On daily 6. EXAM: PORTABLE CHEST 1 VIEW COMPARISON:  Chest radiographs 06/12/2023 and  09/04/2020 FINDINGS: Interval increase in now moderate left and mild right basilar homogeneous opacities likely representing pleural effusions with associated likely atelectasis. No pneumothorax is seen. The visualized cardiac silhouette is unchanged and again may be mildly enlarged. No acute skeletal abnormality. IMPRESSION: Interval increase in now moderate left and mild right basilar homogeneous opacities likely representing pleural effusions with associated likely atelectasis. Electronically Signed   By: Tanda Lyons M.D.   On: 07/11/2023 16:30    Medications: Infusions:  furosemide  120 mg (07/13/23 0609)   iron  sucrose Stopped (07/12/23 1704)    Scheduled Medications:  amLODipine   5 mg Oral Daily   Chlorhexidine  Gluconate Cloth  6 each Topical Daily   heparin   5,000 Units Subcutaneous Q8H   insulin  aspart  0-5 Units Subcutaneous QHS   insulin  aspart  0-6 Units Subcutaneous TID WC   levETIRAcetam   500 mg Oral BID   sodium chloride  flush  3 mL Intravenous Q12H    have reviewed scheduled and prn medications.  Physical Exam: General:NAD, comfortable Heart:RRR, s1s2 nl Lungs: Bilateral reduced breath sound Abdomen:soft, Non-tender, non-distended Extremities: Both upper and lower extremities edema Dialysis Access: No access available  Nayah Lukens Amelie Romney 07/13/2023,11:12 AM  LOS: 1 day

## 2023-07-13 NOTE — Progress Notes (Signed)
 PROGRESS NOTE    Jill Jefferson  FMW:[id number] DOB: 12-09-1960 DOA: 07/11/2023 PCP: Almarie Waddell [name], NP  63/F with history of CKD 5, seizure disorder, chronic diastolic CHF, cataract presented to the ED with shortness of breath and hypoglycemia, has not followed up with nephrology in the last 1 to 2 years.  Compliant with home dose of Lasix , in the ER hypertensive, hypoxic placed on BiPAP initially and then weaned down to 6 L O2, creatinine 11.5, BUN 80, hemoglobin 8.4, proBNP 33608, troponin 108, 99 chest x-ray with increased moderate left and small right effusions, CT abdomen pelvis noted bilateral pleural effusions, generalized subcutaneous edema, ascites, admitted, started on diuretics -Nephrology consulting   Subjective: Feels a little better  Assessment and Plan:  Acute respiratory failure with hypoxia Anasarca AKI on CKD 5 - Admitted with significant volume overload, progressive CKD 5, near ESRD -Weaned off BiPAP -Nephrology consulting, on high-dose diuretics, some response noted, anticipate need to start dialysis this admission, discussed with patient today  Acute on chronic diastolic CHF -In setting of progressive CKD 5/ESRD -Echo in 2022 with EF 65%, G1 DD, normal RV function. - Follow-up repeat echo, diuretics as above, see   Hypertension - Continue home amlodipine  - Diuretics as above   Seizure disorder - Continue Keppra   DVT prophylaxis: Heparin  subcutaneous Code Status: Full code Family Communication: None present Disposition Plan:   Consultants:    Procedures:   Antimicrobials:    Objective: Vitals:   07/12/23 2046 07/13/23 0051 07/13/23 0548 07/13/23 0741  BP: (!) 156/87 (!) 160/89 (!) 167/89 (!) 155/91  Pulse: 87 86 88 90  Resp: 17 15 16 17   Temp: 98.5 F (36.9 C) 98.8 F (37.1 C) 98.4 F (36.9 C) 98.5 F (36.9 C)  TempSrc: Oral Oral Oral Oral  SpO2: 100% 99% 98% 100%  Weight:   69.7 kg   Height:        Intake/Output Summary (Last 24  hours) at 07/13/2023 1026 Last data filed at 07/13/2023 0800 Gross per 24 hour  Intake 866.19 ml  Output 1860 ml  Net -993.81 ml   Filed Weights   07/12/23 1425 07/13/23 0548  Weight: 71.1 kg 69.7 kg    Examination:  General exam: Appears calm and comfortable  HEENT: Positive JVD Respiratory system: Decreased breath sounds at the bases Cardiovascular system: S1 & S2 heard, RRR.  Abd: nondistended, soft and nontender.Normal bowel sounds heard. Central nervous system: Alert and oriented. No focal neurological deficits. Extremities: Plus edema Skin: No rashes Psychiatry:  Mood & affect appropriate.     Data Reviewed:   CBC: Recent Labs  Lab 07/11/23 1625 07/11/23 1632 07/13/23 0257  WBC 6.8  --  6.8  NEUTROABS 5.6  --   --   HGB 8.4* 8.8* 8.2*  HCT 27.5* 26.0* 26.3*  MCV 79.3*  --  79.9*  PLT 293  --  289   Basic Metabolic Panel: Recent Labs  Lab 07/11/23 1625 07/11/23 1632 07/12/23 1618 07/13/23 0257  NA 141 140  --  137  K 4.4 4.3  --  3.9  CL 107  --   --  106  CO2 21*  --   --  22  GLUCOSE 103*  --   --  143*  BUN 86*  --   --  84*  CREATININE 11.50*  --  11.04* 11.20*  CALCIUM  8.8*  --   --  7.9*  PHOS  --   --   --  6.2*  GFR: Estimated Creatinine Clearance: 4.6 mL/min (A) (by C-G formula based on SCr of 11.2 mg/dL (H)). Liver Function Tests: Recent Labs  Lab 07/11/23 1625 07/13/23 0257  AST 24  --   ALT 32  --   ALKPHOS 79  --   BILITOT 0.2  --   PROT 6.2*  --   ALBUMIN 3.3* 2.1*   No results for input(s): LIPASE, AMYLASE in the last 168 hours. No results for input(s): AMMONIA in the last 168 hours. Coagulation Profile: No results for input(s): INR, PROTIME in the last 168 hours. Cardiac Enzymes: No results for input(s): CKTOTAL, CKMB, CKMBINDEX, TROPONINI in the last 168 hours. BNP (last 3 results) Recent Labs    07/11/23 1625  PROBNP 33,608.0*   HbA1C: No results for input(s): HGBA1C in the last 72  hours. CBG: Recent Labs  Lab 07/11/23 1517 07/12/23 0453 07/13/23 0008 07/13/23 0733  GLUCAP 93 81 209* 128*   Lipid Profile: No results for input(s): CHOL, HDL, LDLCALC, TRIG, CHOLHDL, LDLDIRECT in the last 72 hours. Thyroid  Function Tests: No results for input(s): TSH, T4TOTAL, FREET4, T3FREE, THYROIDAB in the last 72 hours. Anemia Panel: Recent Labs    07/11/23 1953  VITAMINB12 1,362*  FOLATE 16.8  FERRITIN 41  TIBC 321  IRON  17*  RETICCTPCT 1.6   Urine analysis:    Component Value Date/Time   COLORURINE STRAW (A) 01/01/2023 1601   APPEARANCEUR CLEAR 01/01/2023 1601   LABSPEC 1.010 01/01/2023 1601   PHURINE 7.0 01/01/2023 1601   GLUCOSEU 50 (A) 01/01/2023 1601   HGBUR SMALL (A) 01/01/2023 1601   BILIRUBINUR NEGATIVE 01/01/2023 1601   BILIRUBINUR neg 06/23/2019 1148   KETONESUR NEGATIVE 01/01/2023 1601   PROTEINUR >=300 (A) 01/01/2023 1601   UROBILINOGEN 0.2 06/23/2019 1148   NITRITE NEGATIVE 01/01/2023 1601   LEUKOCYTESUR NEGATIVE 01/01/2023 1601   Sepsis Labs: @LABRCNTIP (procalcitonin:4,lacticidven:4)  ) Recent Results (from the past 240 hours)  Resp panel by RT-PCR (RSV, Flu A&B, Covid) Anterior Nasal Swab     Status: None   Collection Time: 07/11/23  4:25 PM   Specimen: Anterior Nasal Swab  Result Value Ref Range Status   SARS Coronavirus 2 by RT PCR NEGATIVE NEGATIVE Final    Comment: (NOTE) SARS-CoV-2 target nucleic acids are NOT DETECTED.  The SARS-CoV-2 RNA is generally detectable in upper respiratory specimens during the acute phase of infection. The lowest concentration of SARS-CoV-2 viral copies this assay can detect is 138 copies/mL. A negative result does not preclude SARS-Cov-2 infection and should not be used as the sole basis for treatment or other patient management decisions. A negative result may occur with  improper specimen collection/handling, submission of specimen other than nasopharyngeal swab, presence of  viral mutation(s) within the areas targeted by this assay, and inadequate number of viral copies(<138 copies/mL). A negative result must be combined with clinical observations, patient history, and epidemiological information. The expected result is Negative.  Fact Sheet for Patients:  BloggerCourse.com  Fact Sheet for Healthcare Providers:  SeriousBroker.it  This test is no t yet approved or cleared by the [place]  FDA and  has been authorized for detection and/or diagnosis of SARS-CoV-2 by FDA under an Emergency Use Authorization (EUA). This EUA will remain  in effect (meaning this test can be used) for the duration of the COVID-19 declaration under Section 564(b)(1) of the Act, 21 U.S.C.section 360bbb-3(b)(1), unless the authorization is terminated  or revoked sooner.       Influenza A by PCR NEGATIVE NEGATIVE Final  Influenza B by PCR NEGATIVE NEGATIVE Final    Comment: (NOTE) The Xpert Xpress SARS-CoV-2/FLU/RSV plus assay is intended as an aid in the diagnosis of influenza from Nasopharyngeal swab specimens and should not be used as a sole basis for treatment. Nasal washings and aspirates are unacceptable for Xpert Xpress SARS-CoV-2/FLU/RSV testing.  Fact Sheet for Patients: BloggerCourse.com  Fact Sheet for Healthcare Providers: SeriousBroker.it  This test is not yet approved or cleared by the [place]  FDA and has been authorized for detection and/or diagnosis of SARS-CoV-2 by FDA under an Emergency Use Authorization (EUA). This EUA will remain in effect (meaning this test can be used) for the duration of the COVID-19 declaration under Section 564(b)(1) of the Act, 21 U.S.C. section 360bbb-3(b)(1), unless the authorization is terminated or revoked.     Resp Syncytial Virus by PCR NEGATIVE NEGATIVE Final    Comment: (NOTE) Fact Sheet for  Patients: BloggerCourse.com  Fact Sheet for Healthcare Providers: SeriousBroker.it  This test is not yet approved or cleared by the [place]  FDA and has been authorized for detection and/or diagnosis of SARS-CoV-2 by FDA under an Emergency Use Authorization (EUA). This EUA will remain in effect (meaning this test can be used) for the duration of the COVID-19 declaration under Section 564(b)(1) of the Act, 21 U.S.C. section 360bbb-3(b)(1), unless the authorization is terminated or revoked.  Performed at [facility], [address] [place], [place] [address]      Radiology Studies: CT ABDOMEN PELVIS WO CONTRAST Result Date: 07/11/2023 CLINICAL DATA:  Acute kidney failure. EXAM: CT ABDOMEN AND PELVIS WITHOUT CONTRAST TECHNIQUE: Multidetector CT imaging of the abdomen and pelvis was performed following the standard protocol without IV contrast. RADIATION DOSE REDUCTION: This exam was performed according to the departmental dose-optimization program which includes automated exposure control, adjustment of the mA and/or kV according to patient size and/or use of iterative reconstruction technique. COMPARISON:  Renal ultrasound 04/24/2023 FINDINGS: Lower chest: Moderate bilateral pleural effusions with associated atelectasis. The heart is upper normal in size. Decreased density of the blood pool suggests anemia. Hepatobiliary: Elongated right lobe of the liver. No evidence of focal liver lesion on this unenhanced exam. Gallbladder physiologically distended, no calcified stone. No biliary dilatation. Pancreas: No ductal dilatation or inflammation. Spleen: Normal in size without focal abnormality. Adrenals/Urinary Tract: No adrenal nodule. Right renal atrophy. No hydronephrosis. Small bilateral renal cysts. No further follow-up imaging is recommended. No renal calculi. Partially distended urinary bladder, no wall thickening for  degree of distension. Stomach/Bowel: No bowel obstruction or inflammatory change. Moderate colonic stool burden. Normal appendix. Vascular/Lymphatic: Aortic and branch atherosclerosis. No aortic aneurysm. No bulky abdominopelvic adenopathy. Reproductive: Uterus and bilateral adnexa are unremarkable. Other: There is marked generalized subcutaneous edema, confluent in the flanks. Mild edema of the intra-abdominal fat. Trace ascites in the pelvis. No free air. Minimal fat containing umbilical hernia. Musculoskeletal: There are no acute or suspicious osseous abnormalities. IMPRESSION: 1. Moderate bilateral pleural effusions with associated atelectasis. 2. Findings suggestive of third spacing with marked generalized subcutaneous edema, confluent in the flanks. Mild edema of the intra-abdominal fat. Trace ascites in the pelvis. 3. Right renal atrophy. No hydronephrosis. Aortic Atherosclerosis (ICD10-I70.0). Electronically Signed   By: Andrea Gasman M.D.   On: 07/11/2023 19:55   DG Chest Port 1 View Result Date: 07/11/2023 CLINICAL DATA:  2nd leg now spread to whole body.  On daily 6. EXAM: PORTABLE CHEST 1 VIEW COMPARISON:  Chest radiographs 06/12/2023 and 09/04/2020 FINDINGS: Interval increase in now  moderate left and mild right basilar homogeneous opacities likely representing pleural effusions with associated likely atelectasis. No pneumothorax is seen. The visualized cardiac silhouette is unchanged and again may be mildly enlarged. No acute skeletal abnormality. IMPRESSION: Interval increase in now moderate left and mild right basilar homogeneous opacities likely representing pleural effusions with associated likely atelectasis. Electronically Signed   By: Tanda Lyons M.D.   On: 07/11/2023 16:30     Scheduled Meds:  amLODipine   5 mg Oral Daily   Chlorhexidine  Gluconate Cloth  6 each Topical Daily   heparin   5,000 Units Subcutaneous Q8H   insulin  aspart  0-5 Units Subcutaneous QHS   insulin  aspart  0-6  Units Subcutaneous TID WC   levETIRAcetam   500 mg Oral BID   sodium chloride  flush  3 mL Intravenous Q12H   Continuous Infusions:  furosemide  120 mg (07/13/23 0609)   iron  sucrose Stopped (07/12/23 1704)     LOS: 1 day    Time spent:    Sigurd Pac, MD [facility]   07/13/2023, 10:26 AM

## 2023-07-13 NOTE — Progress Notes (Signed)
 There was a consult for checking patient's PIV access on Lt. Arm. Assessed both arms with US . Both arms were swollen Rt. > Lt. (+ 2-3/ +3-4. Lt.). Assessed Rt. Lower arm - there were no suitable veins ( too small and deep). Rt. Upper arm- identified only one brachial vein. Assessed Lt. Lower arm- there were no suitable veins ( too small and deep). Lt. Upper arm- identified only one brachial vein. There wasn't S/S of infiltration, but since Lt. Arm was swollen than Rt. Arm, it is better resting on Lt. Arm. Patient's GFR was lower with following nephrology team. If patient need a IV access, recommend Midline or PICC with permission from nephrology team. Informed patient's RN regarding assessment and recommendation ( keep the PIV on Lt. Arm at this time). Dr. Fairy also made aware of this matter. HS [employer]

## 2023-07-14 DIAGNOSIS — N179 Acute kidney failure, unspecified: Secondary | ICD-10-CM

## 2023-07-14 DIAGNOSIS — N184 Chronic kidney disease, stage 4 (severe): Secondary | ICD-10-CM

## 2023-07-14 LAB — RENAL FUNCTION PANEL
Albumin: 2.2 g/dL — ABNORMAL LOW (ref 3.5–5.0)
Anion gap: 11 (ref 5–15)
BUN: 86 mg/dL — ABNORMAL HIGH (ref 8–23)
CO2: 23 mmol/L (ref 22–32)
Calcium: 7.7 mg/dL — ABNORMAL LOW (ref 8.9–10.3)
Chloride: 101 mmol/L (ref 98–111)
Creatinine, Ser: 10.9 mg/dL — ABNORMAL HIGH (ref 0.44–1.00)
GFR, Estimated: 4 mL/min — ABNORMAL LOW (ref >60)
Glucose, Bld: 84 mg/dL (ref 70–99)
Phosphorus: 6.4 mg/dL — ABNORMAL HIGH (ref 2.5–4.6)
Potassium: 3.8 mmol/L (ref 3.5–5.1)
Sodium: 135 mmol/L (ref 135–145)

## 2023-07-14 LAB — GLUCOSE, CAPILLARY
Glucose-Capillary: 86 mg/dL (ref 70–99)
Glucose-Capillary: 177 mg/dL — ABNORMAL HIGH (ref 70–99)
Glucose-Capillary: 247 mg/dL — ABNORMAL HIGH (ref 70–99)
Glucose-Capillary: 210 mg/dL — ABNORMAL HIGH (ref 70–99)

## 2023-07-14 LAB — PARATHYROID HORMONE, INTACT (NO CA): PTH: 433 pg/mL — ABNORMAL HIGH (ref 15–65)

## 2023-07-14 MED ORDER — CARVEDILOL 6.25 MG PO TABS
6.2500 mg | ORAL_TABLET | Freq: Two times a day (BID) | ORAL | Status: DC
  Administered 2023-07-14 – 2023-07-18 (×8): 6.25 mg via ORAL
  Filled 2023-07-14 (×8): qty 1

## 2023-07-14 MED ORDER — ASPIRIN 81 MG PO TBEC
81.0000 mg | DELAYED_RELEASE_TABLET | Freq: Every day | ORAL | Status: DC
  Administered 2023-07-14 – 2023-07-18 (×5): 81 mg via ORAL
  Filled 2023-07-14 (×5): qty 1

## 2023-07-14 MED ORDER — METOLAZONE 5 MG PO TABS
5.0000 mg | ORAL_TABLET | Freq: Every day | ORAL | Status: DC
  Administered 2023-07-14 – 2023-07-18 (×5): 5 mg via ORAL
  Filled 2023-07-14 (×5): qty 1

## 2023-07-14 NOTE — Progress Notes (Addendum)
 PROGRESS NOTE    Jill Jefferson  FMW:[id number] DOB: December 08, 1960 DOA: 07/11/2023 PCP: Jill Waddell [name], NP  63/F with history of CKD 5, seizure disorder, chronic diastolic CHF, cataract presented to the ED with shortness of breath and hypoglycemia, has not followed up with nephrology in the last 1 to 2 years.  Compliant with home dose of Lasix , in the ER hypertensive, hypoxic placed on BiPAP initially and then weaned down to 6 L O2, creatinine 11.5, BUN 80, hemoglobin 8.4, proBNP 33608, troponin 108, 99 chest x-ray with increased moderate left and small right effusions, CT abdomen pelvis noted bilateral pleural effusions, generalized subcutaneous edema, ascites, admitted, started on diuretics -Nephrology consulting   Subjective: Reports dyspnea, orthopnea, appears to be undecided regarding dialysis  Assessment and Plan:  Acute respiratory failure with hypoxia Anasarca AKI on CKD 5 - Admitted with significant volume overload, progressive CKD 5, near ESRD -Weaned off BiPAP -Nephrology consulting, on high-dose diuretics, limited response  - Remains significantly volume overloaded, discussed with patient regarding need to start dialysis this admission  Acute on chronic systolic and diastolic CHF -In setting of progressive CKD 5/ESRD -Echo in 2022 with EF 65%, G1 DD, normal RV function. -Repeat echo with EF down to 45%, some wall motion abnormalities noted, not appropriate for cardiac cath at this setting with advanced CKD 5 - Medical management, diuretics as above, will need cardiology follow-up - Change amlodipine  to Coreg , add aspirin    Hypertension - See above   Seizure disorder - Continue Keppra   DVT prophylaxis: Heparin  subcutaneous Code Status: Full code Family Communication: None present Disposition Plan:   Consultants:    Procedures:   Antimicrobials:    Objective: Vitals:   07/14/23 0427 07/14/23 0500 07/14/23 0757 07/14/23 1007  BP: (!) 155/75  (!) 141/79 (!)  144/68  Pulse: 84     Resp: 18  17   Temp: 98.4 F (36.9 C)  98.3 F (36.8 C)   TempSrc: Oral  Oral   SpO2: 97%     Weight:  68.5 kg    Height:        Intake/Output Summary (Last 24 hours) at 07/14/2023 1048 Last data filed at 07/14/2023 0900 Gross per 24 hour  Intake 642.28 ml  Output 1800 ml  Net -1157.72 ml   Filed Weights   07/12/23 1425 07/13/23 0548 07/14/23 0500  Weight: 71.1 kg 69.7 kg 68.5 kg    Examination:  General exam: Appears calm and comfortable, AO x 3 HEENT: Positive JVD CVS: S1-S2, regular rhythm Lungs: Decreased breath sounds at the bases Abdomen: Soft, nontender, bowel sounds present, abdominal wall edema Extremities: 2+ edema Skin: No rashes Psychiatry:  Mood & affect appropriate.     Data Reviewed:   CBC: Recent Labs  Lab 07/11/23 1625 07/11/23 1632 07/13/23 0257  WBC 6.8  --  6.8  NEUTROABS 5.6  --   --   HGB 8.4* 8.8* 8.2*  HCT 27.5* 26.0* 26.3*  MCV 79.3*  --  79.9*  PLT 293  --  289   Basic Metabolic Panel: Recent Labs  Lab 07/11/23 1625 07/11/23 1632 07/12/23 1618 07/13/23 0257 07/14/23 0826  NA 141 140  --  137 135  K 4.4 4.3  --  3.9 3.8  CL 107  --   --  106 101  CO2 21*  --   --  22 23  GLUCOSE 103*  --   --  143* 84  BUN 86*  --   --  84* 86*  CREATININE 11.50*  --  11.04* 11.20* 10.90*  CALCIUM  8.8*  --   --  7.9* 7.7*  PHOS  --   --   --  6.2* 6.4*   GFR: Estimated Creatinine Clearance: 4.7 mL/min (A) (by C-G formula based on SCr of 10.9 mg/dL (H)). Liver Function Tests: Recent Labs  Lab 07/11/23 1625 07/13/23 0257 07/14/23 0826  AST 24  --   --   ALT 32  --   --   ALKPHOS 79  --   --   BILITOT 0.2  --   --   PROT 6.2*  --   --   ALBUMIN 3.3* 2.1* 2.2*   No results for input(s): LIPASE, AMYLASE in the last 168 hours. No results for input(s): AMMONIA in the last 168 hours. Coagulation Profile: No results for input(s): INR, PROTIME in the last 168 hours. Cardiac Enzymes: No results for  input(s): CKTOTAL, CKMB, CKMBINDEX, TROPONINI in the last 168 hours. BNP (last 3 results) Recent Labs    07/11/23 1625  PROBNP 33,608.0*   HbA1C: No results for input(s): HGBA1C in the last 72 hours. CBG: Recent Labs  Lab 07/13/23 1153 07/13/23 1316 07/13/23 1633 07/13/23 2054 07/14/23 0812  GLUCAP 200* 185* 237* 197* 86   Lipid Profile: No results for input(s): CHOL, HDL, LDLCALC, TRIG, CHOLHDL, LDLDIRECT in the last 72 hours. Thyroid  Function Tests: No results for input(s): TSH, T4TOTAL, FREET4, T3FREE, THYROIDAB in the last 72 hours. Anemia Panel: Recent Labs    07/11/23 1953  VITAMINB12 1,362*  FOLATE 16.8  FERRITIN 41  TIBC 321  IRON  17*  RETICCTPCT 1.6   Urine analysis:    Component Value Date/Time   COLORURINE STRAW (A) 01/01/2023 1601   APPEARANCEUR CLEAR 01/01/2023 1601   LABSPEC 1.010 01/01/2023 1601   PHURINE 7.0 01/01/2023 1601   GLUCOSEU 50 (A) 01/01/2023 1601   HGBUR SMALL (A) 01/01/2023 1601   BILIRUBINUR NEGATIVE 01/01/2023 1601   BILIRUBINUR neg 06/23/2019 1148   KETONESUR NEGATIVE 01/01/2023 1601   PROTEINUR >=300 (A) 01/01/2023 1601   UROBILINOGEN 0.2 06/23/2019 1148   NITRITE NEGATIVE 01/01/2023 1601   LEUKOCYTESUR NEGATIVE 01/01/2023 1601   Sepsis Labs: @LABRCNTIP (procalcitonin:4,lacticidven:4)  ) Recent Results (from the past 240 hours)  Resp panel by RT-PCR (RSV, Flu A&B, Covid) Anterior Nasal Swab     Status: None   Collection Time: 07/11/23  4:25 PM   Specimen: Anterior Nasal Swab  Result Value Ref Range Status   SARS Coronavirus 2 by RT PCR NEGATIVE NEGATIVE Final    Comment: (NOTE) SARS-CoV-2 target nucleic acids are NOT DETECTED.  The SARS-CoV-2 RNA is generally detectable in upper respiratory specimens during the acute phase of infection. The lowest concentration of SARS-CoV-2 viral copies this assay can detect is 138 copies/mL. A negative result does not preclude SARS-Cov-2 infection and  should not be used as the sole basis for treatment or other patient management decisions. A negative result may occur with  improper specimen collection/handling, submission of specimen other than nasopharyngeal swab, presence of viral mutation(s) within the areas targeted by this assay, and inadequate number of viral copies(<138 copies/mL). A negative result must be combined with clinical observations, patient history, and epidemiological information. The expected result is Negative.  Fact Sheet for Patients:  BloggerCourse.com  Fact Sheet for Healthcare Providers:  SeriousBroker.it  This test is no t yet approved or cleared by the [place]  FDA and  has been authorized for detection and/or diagnosis of SARS-CoV-2 by FDA under an  Emergency Use Authorization (EUA). This EUA will remain  in effect (meaning this test can be used) for the duration of the COVID-19 declaration under Section 564(b)(1) of the Act, 21 U.S.C.section 360bbb-3(b)(1), unless the authorization is terminated  or revoked sooner.       Influenza A by PCR NEGATIVE NEGATIVE Final   Influenza B by PCR NEGATIVE NEGATIVE Final    Comment: (NOTE) The Xpert Xpress SARS-CoV-2/FLU/RSV plus assay is intended as an aid in the diagnosis of influenza from Nasopharyngeal swab specimens and should not be used as a sole basis for treatment. Nasal washings and aspirates are unacceptable for Xpert Xpress SARS-CoV-2/FLU/RSV testing.  Fact Sheet for Patients: BloggerCourse.com  Fact Sheet for Healthcare Providers: SeriousBroker.it  This test is not yet approved or cleared by the [place]  FDA and has been authorized for detection and/or diagnosis of SARS-CoV-2 by FDA under an Emergency Use Authorization (EUA). This EUA will remain in effect (meaning this test can be used) for the duration of the COVID-19 declaration  under Section 564(b)(1) of the Act, 21 U.S.C. section 360bbb-3(b)(1), unless the authorization is terminated or revoked.     Resp Syncytial Virus by PCR NEGATIVE NEGATIVE Final    Comment: (NOTE) Fact Sheet for Patients: BloggerCourse.com  Fact Sheet for Healthcare Providers: SeriousBroker.it  This test is not yet approved or cleared by the [place]  FDA and has been authorized for detection and/or diagnosis of SARS-CoV-2 by FDA under an Emergency Use Authorization (EUA). This EUA will remain in effect (meaning this test can be used) for the duration of the COVID-19 declaration under Section 564(b)(1) of the Act, 21 U.S.C. section 360bbb-3(b)(1), unless the authorization is terminated or revoked.  Performed at [facility], [address], [place], [place] [address]      Radiology Studies: ECHOCARDIOGRAM COMPLETE Result Date: 07/13/2023    ECHOCARDIOGRAM REPORT   Patient Name:   Jill Jefferson Date of Exam: 07/13/2023 Medical Rec #:  [id number]       Height:       60.0 in Accession #:    [id number]      Weight:       153.7 lb Date of Birth:  1960/02/12        BSA:          1.669 m Patient Age:    63 years        BP:           167/89 mmHg Patient Gender: F               HR:           88 bpm. Exam Location:  Inpatient Procedure: 2D Echo, 3D Echo, Cardiac Doppler, Color Doppler and Strain Analysis            (Both Spectral and Color Flow Doppler were utilized during            procedure). Indications:    CHF  History:        Patient has prior history of Echocardiogram examinations, most                 recent 09/02/2020. Risk Factors:Hypertension and Diabetes.  Sonographer:    Therisa Crouch Referring Phys: [id number] MARSA Jefferson MELVIN IMPRESSIONS  1. Left ventricular ejection fraction, by estimation, is 40 to 45%. The left ventricle has mildly decreased function. The left ventricle demonstrates regional wall motion  abnormalities (see scoring diagram/findings for description). Apical akinesis. There is mild left ventricular hypertrophy.  Left ventricular diastolic parameters are indeterminate.  2. Right ventricular systolic function is normal. The right ventricular size is normal. There is moderately elevated pulmonary artery systolic pressure. The estimated right ventricular systolic pressure is 45.2 mmHg.  3. Left atrial size was mildly dilated.  4. Moderate pleural effusion in the left lateral region.  5. The mitral valve is normal in structure. Trivial mitral valve regurgitation. No evidence of mitral stenosis.  6. The aortic valve is tricuspid. Aortic valve regurgitation is mild. No aortic stenosis is present.  7. The inferior vena cava is normal in size with greater than 50% respiratory variability, suggesting right atrial pressure of 3 mmHg. FINDINGS  Left Ventricle: Left ventricular ejection fraction, by estimation, is 40 to 45%. The left ventricle has mildly decreased function. The left ventricle demonstrates regional wall motion abnormalities. The left ventricular internal cavity size was normal in size. There is mild left ventricular hypertrophy. Left ventricular diastolic parameters are indeterminate.  LV Wall Scoring: The entire apex is akinetic. The anterior wall, antero-lateral wall, anterior septum, inferior wall, posterior wall, mid inferoseptal segment, and basal inferoseptal segment are normal. Right Ventricle: The right ventricular size is normal. No increase in right ventricular wall thickness. Right ventricular systolic function is normal. There is moderately elevated pulmonary artery systolic pressure. The tricuspid regurgitant velocity is 3.25 m/s, and with an assumed right atrial pressure of 3 mmHg, the estimated right ventricular systolic pressure is 45.2 mmHg. Left Atrium: Left atrial size was mildly dilated. Right Atrium: Right atrial size was normal in size. Pericardium: Trivial pericardial effusion  is present. Mitral Valve: The mitral valve is normal in structure. Trivial mitral valve regurgitation. No evidence of mitral valve stenosis. Tricuspid Valve: The tricuspid valve is normal in structure. Tricuspid valve regurgitation is mild. Aortic Valve: The aortic valve is tricuspid. Aortic valve regurgitation is mild. No aortic stenosis is present. Pulmonic Valve: The pulmonic valve was not well visualized. Pulmonic valve regurgitation is trivial. Aorta: The aortic root and ascending aorta are structurally normal, with no evidence of dilitation. Venous: The inferior vena cava is normal in size with greater than 50% respiratory variability, suggesting right atrial pressure of 3 mmHg. IAS/Shunts: The interatrial septum was not well visualized. Additional Comments: There is a moderate pleural effusion in the left lateral region.  LEFT VENTRICLE PLAX 2D LVIDd:         4.60 cm      Diastology LVIDs:         3.00 cm      LV e' medial:    6.31 cm/s LV PW:         1.00 cm      LV E/e' medial:  12.2 LV IVS:        1.20 cm      LV e' lateral:   9.90 cm/s LVOT diam:     2.10 cm      LV E/e' lateral: 7.8 LVOT Area:     3.46 cm                              3D Volume EF LV Volumes (MOD)            LV 3D EDV:   79.43 ml LV vol d, MOD A2C: 101.0 ml LV 3D ESV:   42.68 ml LV vol d, MOD A4C: 66.0 ml LV vol s, MOD A2C: 44.4 ml LV vol s, MOD A4C: 35.9 ml LV SV  MOD A2C:     56.6 ml LV SV MOD A4C:     66.0 ml LV SV MOD BP:      44.8 ml RIGHT VENTRICLE            IVC RV Basal diam:  4.10 cm    IVC diam: 1.70 cm RV S prime:     8.49 cm/s TAPSE (M-mode): 2.1 cm LEFT ATRIUM             Index LA diam:        3.90 cm 2.34 cm/m LA Vol (A2C):   66.4 ml 39.79 ml/m LA Vol (A4C):   56.4 ml 33.80 ml/m LA Biplane Vol: 65.3 ml 39.13 ml/m   AORTA Ao Root diam: 2.60 cm Ao Asc diam:  2.80 cm MITRAL VALVE               TRICUSPID VALVE MV Area (PHT): 5.97 cm    TR Peak grad:   42.2 mmHg MV Decel Time: 127 msec    TR Vmax:        325.00 cm/s MV E  velocity: 77.00 cm/s MV A velocity: 83.30 cm/s  SHUNTS MV E/A ratio:  0.92        Systemic Diam: 2.10 cm Lonni Nanas MD Electronically signed by Lonni Nanas MD Signature Date/Time: 07/13/2023/2:51:51 PM    Final      Scheduled Meds:  amLODipine   5 mg Oral Daily   Chlorhexidine  Gluconate Cloth  6 each Topical Daily   darbepoetin (ARANESP ) injection - DIALYSIS  100 mcg Subcutaneous Q Sat-1800   heparin   5,000 Units Subcutaneous Q8H   insulin  aspart  0-5 Units Subcutaneous QHS   insulin  aspart  0-6 Units Subcutaneous TID WC   levETIRAcetam   500 mg Oral BID   sodium chloride  flush  3 mL Intravenous Q12H   Continuous Infusions:  furosemide  120 mg (07/14/23 0557)   iron  sucrose Stopped (07/13/23 1802)     LOS: 2 days    Time spent:    Sigurd Pac, MD [facility]   07/14/2023, 10:48 AM

## 2023-07-14 NOTE — Progress Notes (Signed)
  [facility] PROGRESS NOTE  Assessment/ Plan: Pt is a 63 y.o. yo female  with PMH significant for hypertension, diabetes, CKD 5 with poor outpatient follow-up presented with worsening shortness of breath, peripheral edema for last few weeks.  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  She has poor outpatient follow-up, last seen by nephrologist in 2023. Patient is hesitant to start dialysis therefore attempting high-dose IV diuretics.  She understands that she will most likely need dialysis during this hospitalization. Continue Lasix  IV, metolazone  and strict ins and outs.   - Again emphasized that she will benefit from dialysis, she is not ready to start HD at this time.  We will continue current diuretic regimen to manage volume.  # Acute on chronic diastolic CHF with fluid overload: Elevated BNP level.  Echo with EF of 40 to 45%.  Mild LVH.  Diuretics as above.   # Hypertension/volume: Diuretics should help optimize blood pressure.  May need dialysis soon.   # CKD-MBD/hyperphosphatemia: Started sevelamer, PTH level pending.   # Anemia of chronic disease/iron  deficiency anemia: Iron  saturation 5% with serum iron  of 17 and ferritin 41.  Started IV iron  and Aranesp .  Subjective: Seen and examined at bedside.  Urine output is around 2 L.  She is encouraged that the diuretics is helping some improvement of peripheral edema.  She is not ready to do dialysis at this time.  Prolonged discussion about dialysis etc. with the patient today.  Objective Vital signs in last 24 hours: Vitals:   07/14/23 0427 07/14/23 0500 07/14/23 0757 07/14/23 1007  BP: (!) 155/75  (!) 141/79 (!) 144/68  Pulse: 84     Resp: 18  17   Temp: 98.4 F (36.9 C)  98.3 F (36.8 C)   TempSrc: Oral  Oral   SpO2: 97%     Weight:  68.5 kg    Height:       Weight change: -2.607  kg  Intake/Output Summary (Last 24 hours) at 07/14/2023 1305 Last data filed at 07/14/2023 0900 Gross per 24 hour  Intake 642.28 ml  Output 1800 ml  Net -1157.72 ml       Labs: RENAL PANEL Recent Labs  Lab 07/11/23 1625 07/11/23 1632 07/12/23 1618 07/13/23 0257 07/14/23 0826  NA 141 140  --  137 135  K 4.4 4.3  --  3.9 3.8  CL 107  --   --  106 101  CO2 21*  --   --  22 23  GLUCOSE 103*  --   --  143* 84  BUN 86*  --   --  84* 86*  CREATININE 11.50*  --  11.04* 11.20* 10.90*  CALCIUM  8.8*  --   --  7.9* 7.7*  PHOS  --   --   --  6.2* 6.4*  ALBUMIN 3.3*  --   --  2.1* 2.2*    Liver Function Tests: Recent Labs  Lab 07/11/23 1625 07/13/23 0257 07/14/23 0826  AST 24  --   --   ALT 32  --   --   ALKPHOS 79  --   --   BILITOT 0.2  --   --   PROT 6.2*  --   --   ALBUMIN 3.3* 2.1* 2.2*   No results for input(s): LIPASE, AMYLASE in the last 168 hours. No results for input(s): AMMONIA in the  last 168 hours. CBC: Recent Labs    01/01/23 1601 04/02/23 1032 07/11/23 1625 07/11/23 1632 07/11/23 1953 07/13/23 0257  HGB 12.3 10.5* 8.4* 8.8*  --  8.2*  MCV 88.0 83.2 79.3*  --   --  79.9*  VITAMINB12  --   --   --   --  1,362*  --   FOLATE  --   --   --   --  16.8  --   FERRITIN  --   --   --   --  41  --   TIBC  --   --   --   --  321  --   IRON   --   --   --   --  17*  --   RETICCTPCT  --   --   --   --  1.6  --     Cardiac Enzymes: No results for input(s): CKTOTAL, CKMB, CKMBINDEX, TROPONINI in the last 168 hours. CBG: Recent Labs  Lab 07/13/23 1316 07/13/23 1633 07/13/23 2054 07/14/23 0812 07/14/23 1054  GLUCAP 185* 237* 197* 86 177*    Iron  Studies:  Recent Labs    07/11/23 1953  IRON  17*  TIBC 321  FERRITIN 41   Studies/Results: ECHOCARDIOGRAM COMPLETE Result Date: 07/13/2023    ECHOCARDIOGRAM REPORT   Patient Name:   Jill Jefferson Date of Exam: 07/13/2023 Medical Rec #:  [id number]       Height:       60.0 in Accession #:     [id number]      Weight:       153.7 lb Date of Birth:  [birth year]        BSA:          1.669 m Patient Age:    63 years        BP:           167/89 mmHg Patient Gender: F               HR:           88 bpm. Exam Location:  Inpatient Procedure: 2D Echo, 3D Echo, Cardiac Doppler, Color Doppler and Strain Analysis            (Both Spectral and Color Flow Doppler were utilized during            procedure). Indications:    CHF  History:        Patient has prior history of Echocardiogram examinations, most                 recent 09/02/2020. Risk Factors:Hypertension and Diabetes.  Sonographer:    Therisa Crouch Referring Phys: [id number] MARSA [name] MELVIN IMPRESSIONS  1. Left ventricular ejection fraction, by estimation, is 40 to 45%. The left ventricle has mildly decreased function. The left ventricle demonstrates regional wall motion abnormalities (see scoring diagram/findings for description). Apical akinesis. There is mild left ventricular hypertrophy. Left ventricular diastolic parameters are indeterminate.  2. Right ventricular systolic function is normal. The right ventricular size is normal. There is moderately elevated pulmonary artery systolic pressure. The estimated right ventricular systolic pressure is 45.2 mmHg.  3. Left atrial size was mildly dilated.  4. Moderate pleural effusion in the left lateral region.  5. The mitral valve is normal in structure. Trivial mitral valve regurgitation. No evidence of mitral stenosis.  6. The aortic valve is tricuspid. Aortic valve regurgitation is mild. No aortic stenosis is present.  7. The inferior  vena cava is normal in size with greater than 50% respiratory variability, suggesting right atrial pressure of 3 mmHg. FINDINGS  Left Ventricle: Left ventricular ejection fraction, by estimation, is 40 to 45%. The left ventricle has mildly decreased function. The left ventricle demonstrates regional wall motion abnormalities. The left ventricular internal cavity size was normal in  size. There is mild left ventricular hypertrophy. Left ventricular diastolic parameters are indeterminate.  LV Wall Scoring: The entire apex is akinetic. The anterior wall, antero-lateral wall, anterior septum, inferior wall, posterior wall, mid inferoseptal segment, and basal inferoseptal segment are normal. Right Ventricle: The right ventricular size is normal. No increase in right ventricular wall thickness. Right ventricular systolic function is normal. There is moderately elevated pulmonary artery systolic pressure. The tricuspid regurgitant velocity is 3.25 m/s, and with an assumed right atrial pressure of 3 mmHg, the estimated right ventricular systolic pressure is 45.2 mmHg. Left Atrium: Left atrial size was mildly dilated. Right Atrium: Right atrial size was normal in size. Pericardium: Trivial pericardial effusion is present. Mitral Valve: The mitral valve is normal in structure. Trivial mitral valve regurgitation. No evidence of mitral valve stenosis. Tricuspid Valve: The tricuspid valve is normal in structure. Tricuspid valve regurgitation is mild. Aortic Valve: The aortic valve is tricuspid. Aortic valve regurgitation is mild. No aortic stenosis is present. Pulmonic Valve: The pulmonic valve was not well visualized. Pulmonic valve regurgitation is trivial. Aorta: The aortic root and ascending aorta are structurally normal, with no evidence of dilitation. Venous: The inferior vena cava is normal in size with greater than 50% respiratory variability, suggesting right atrial pressure of 3 mmHg. IAS/Shunts: The interatrial septum was not well visualized. Additional Comments: There is a moderate pleural effusion in the left lateral region.  LEFT VENTRICLE PLAX 2D LVIDd:         4.60 cm      Diastology LVIDs:         3.00 cm      LV e' medial:    6.31 cm/s LV PW:         1.00 cm      LV E/e' medial:  12.2 LV IVS:        1.20 cm      LV e' lateral:   9.90 cm/s LVOT diam:     2.10 cm      LV E/e' lateral: 7.8  LVOT Area:     3.46 cm                              3D Volume EF LV Volumes (MOD)            LV 3D EDV:   79.43 ml LV vol d, MOD A2C: 101.0 ml LV 3D ESV:   42.68 ml LV vol d, MOD A4C: 66.0 ml LV vol s, MOD A2C: 44.4 ml LV vol s, MOD A4C: 35.9 ml LV SV MOD A2C:     56.6 ml LV SV MOD A4C:     66.0 ml LV SV MOD BP:      44.8 ml RIGHT VENTRICLE            IVC RV Basal diam:  4.10 cm    IVC diam: 1.70 cm RV S prime:     8.49 cm/s TAPSE (M-mode): 2.1 cm LEFT ATRIUM             Index LA diam:  3.90 cm 2.34 cm/m LA Vol (A2C):   66.4 ml 39.79 ml/m LA Vol (A4C):   56.4 ml 33.80 ml/m LA Biplane Vol: 65.3 ml 39.13 ml/m   AORTA Ao Root diam: 2.60 cm Ao Asc diam:  2.80 cm MITRAL VALVE               TRICUSPID VALVE MV Area (PHT): 5.97 cm    TR Peak grad:   42.2 mmHg MV Decel Time: 127 msec    TR Vmax:        325.00 cm/s MV E velocity: 77.00 cm/s MV A velocity: 83.30 cm/s  SHUNTS MV E/A ratio:  0.92        Systemic Diam: 2.10 cm Lonni Nanas MD Electronically signed by Lonni Nanas MD Signature Date/Time: 07/13/2023/2:51:51 PM    Final     Medications: Infusions:  furosemide  120 mg (07/14/23 0557)   iron  sucrose Stopped (07/13/23 1802)    Scheduled Medications:  aspirin  EC  81 mg Oral Daily   carvedilol   6.25 mg Oral BID WC   Chlorhexidine  Gluconate Cloth  6 each Topical Daily   darbepoetin (ARANESP ) injection - DIALYSIS  100 mcg Subcutaneous Q Sat-1800   heparin   5,000 Units Subcutaneous Q8H   insulin  aspart  0-5 Units Subcutaneous QHS   insulin  aspart  0-6 Units Subcutaneous TID WC   levETIRAcetam   500 mg Oral BID   metolazone   5 mg Oral Daily   sodium chloride  flush  3 mL Intravenous Q12H    have reviewed scheduled and prn medications.  Physical Exam: General:NAD, comfortable Heart:RRR, s1s2 nl Lungs: Bilateral reduced breath sound Abdomen:soft, Non-tender, non-distended Extremities: Both upper and lower extremities edema Dialysis Access: No access available  Lex Linhares Prasad  Deyvi Bonanno 07/14/2023,1:05 PM  LOS: 2 days

## 2023-07-14 NOTE — Progress Notes (Signed)
 Patient complained of pain in left arm iv site.  Iron  infusion completed, iv flush.  Patient states pain is gone.  Left arm elevated.  Spoke to Dr Fairy, she states as long as it is not hurting now and flushing, to secure it as best as possible.

## 2023-07-14 NOTE — Plan of Care (Signed)
 ?  Problem: Clinical Measurements: ?Goal: Will remain free from infection ?Outcome: Progressing ?  ?

## 2023-07-15 DIAGNOSIS — N184 Chronic kidney disease, stage 4 (severe): Secondary | ICD-10-CM | POA: Diagnosis not present

## 2023-07-15 DIAGNOSIS — N179 Acute kidney failure, unspecified: Secondary | ICD-10-CM | POA: Diagnosis not present

## 2023-07-15 LAB — RENAL FUNCTION PANEL
Albumin: 2.1 g/dL — ABNORMAL LOW (ref 3.5–5.0)
Anion gap: 12 (ref 5–15)
BUN: 91 mg/dL — ABNORMAL HIGH (ref 8–23)
CO2: 24 mmol/L (ref 22–32)
Calcium: 7.7 mg/dL — ABNORMAL LOW (ref 8.9–10.3)
Chloride: 102 mmol/L (ref 98–111)
Creatinine, Ser: 11.1 mg/dL — ABNORMAL HIGH (ref 0.44–1.00)
GFR, Estimated: 4 mL/min — ABNORMAL LOW (ref >60)
Glucose, Bld: 110 mg/dL — ABNORMAL HIGH (ref 70–99)
Phosphorus: 6.6 mg/dL — ABNORMAL HIGH (ref 2.5–4.6)
Potassium: 4 mmol/L (ref 3.5–5.1)
Sodium: 138 mmol/L (ref 135–145)

## 2023-07-15 LAB — GLUCOSE, CAPILLARY
Glucose-Capillary: 89 mg/dL (ref 70–99)
Glucose-Capillary: 162 mg/dL — ABNORMAL HIGH (ref 70–99)
Glucose-Capillary: 246 mg/dL — ABNORMAL HIGH (ref 70–99)
Glucose-Capillary: 248 mg/dL — ABNORMAL HIGH (ref 70–99)

## 2023-07-15 LAB — CBC
HCT: 25.9 % — ABNORMAL LOW (ref 36.0–46.0)
Hemoglobin: 7.9 g/dL — ABNORMAL LOW (ref 12.0–15.0)
MCH: 24.4 pg — ABNORMAL LOW (ref 26.0–34.0)
MCHC: 30.5 g/dL (ref 30.0–36.0)
MCV: 79.9 fL — ABNORMAL LOW (ref 80.0–100.0)
Platelets: 278 K/uL (ref 150–400)
RBC: 3.24 MIL/uL — ABNORMAL LOW (ref 3.87–5.11)
RDW: 14.1 % (ref 11.5–15.5)
WBC: 6.8 K/uL (ref 4.0–10.5)
nRBC: 0 % (ref 0.0–0.2)

## 2023-07-15 MED ORDER — CALCITRIOL 0.25 MCG PO CAPS
0.2500 ug | ORAL_CAPSULE | Freq: Every day | ORAL | Status: DC
  Administered 2023-07-15 – 2023-07-18 (×4): 0.25 ug via ORAL
  Filled 2023-07-15 (×4): qty 1

## 2023-07-15 NOTE — Progress Notes (Signed)
 [facility] NEPHROLOGY PROGRESS NOTE  Assessment/ Plan: Pt is a 63 y.o. yo female  with PMH significant for hypertension, diabetes, CKD 5 with poor outpatient follow-up presented with worsening shortness of breath, peripheral edema for last few weeks.  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   -Patient is unwilling to start dialysis at this time.  She is responding to high-dose IV diuretics and has no frank uremic symptoms so ok to hold  -Continue Lasix  IV, metolazone  and strict ins and outs.   - Again emphasized that she will benefit from dialysis, she is not ready to start HD at this time.  We will continue current diuretic regimen to manage volume. -she is working full time as a Geophysicist/field seismologist. Discussed dialysis options with her today as well as future transplant planning.  Encouraged her she if she doesn't need HD this admission it will be in near future and she will need f/u with [facility] or other nephrologist.  # Acute on chronic diastolic CHF with fluid overload: Elevated BNP level.  Echo with EF of 40 to 45%.  Mild LVH.  Diuretics as above - diuresing    # Hypertension/volume: BP improving with diuresis.    # CKD-MBD/hyperphosphatemia: phos 6.6, corr ca 9.3, PTH 433, start calcitriol  0.25 daily.  On sevelemer.    # Anemia of chronic disease/iron  deficiency anemia: Iron  saturation 5% with serum iron  of 17 and ferritin 41.  On IV iron  and Aranesp .  Subjective: Seen and examined at bedside.  Urine output is around 2 L.  Feels edema improving.  No further orthopnea.  Denies dysgeusia, hiccups, nausea, difficulty with cognition.   Objective Vital signs in last 24 hours: Vitals:   07/14/23 2254 07/15/23 0500 07/15/23 0700 07/15/23 0854  BP: (!) 146/79 (!) 148/75 133/73   Pulse: 76  80   Resp: 17 17 20    Temp: 97.9 F  (36.6 C) 97.8 F (36.6 C) 98 F (36.7 C)   TempSrc: Oral Oral Oral   SpO2: 100%  98%   Weight:    67.8 kg  Height:       Weight change:   Intake/Output Summary (Last 24 hours) at 07/15/2023 1123 Last data filed at 07/15/2023 0942 Gross per 24 hour  Intake 368.24 ml  Output 2225 ml  Net -1856.76 ml       Labs: RENAL PANEL Recent Labs  Lab 07/11/23 1625 07/11/23 1632 07/12/23 1618 07/13/23 0257 07/14/23 0826 07/15/23 0412  NA 141 140  --  137 135 138  K 4.4 4.3  --  3.9 3.8 4.0  CL 107  --   --  106 101 102  CO2 21*  --   --  22 23 24   GLUCOSE 103*  --   --  143* 84 110*  BUN 86*  --   --  84* 86* 91*  CREATININE 11.50*  --  11.04* 11.20* 10.90* 11.10*  CALCIUM  8.8*  --   --  7.9* 7.7* 7.7*  PHOS  --   --   --  6.2* 6.4* 6.6*  ALBUMIN 3.3*  --   --  2.1* 2.2* 2.1*    Liver Function Tests: Recent Labs  Lab 07/11/23 1625 07/13/23 0257 07/14/23 0826 07/15/23 0412  AST 24  --   --   --   ALT 32  --   --   --  ALKPHOS 79  --   --   --   BILITOT 0.2  --   --   --   PROT 6.2*  --   --   --   ALBUMIN 3.3* 2.1* 2.2* 2.1*   No results for input(s): LIPASE, AMYLASE in the last 168 hours. No results for input(s): AMMONIA in the last 168 hours. CBC: Recent Labs    04/02/23 1032 07/11/23 1625 07/11/23 1632 07/11/23 1953 07/13/23 0257 07/15/23 0412  HGB 10.5* 8.4* 8.8*  --  8.2* 7.9*  MCV 83.2 79.3*  --   --  79.9* 79.9*  VITAMINB12  --   --   --  1,362*  --   --   FOLATE  --   --   --  16.8  --   --   FERRITIN  --   --   --  41  --   --   TIBC  --   --   --  321  --   --   IRON   --   --   --  17*  --   --   RETICCTPCT  --   --   --  1.6  --   --     Cardiac Enzymes: No results for input(s): CKTOTAL, CKMB, CKMBINDEX, TROPONINI in the last 168 hours. CBG: Recent Labs  Lab 07/14/23 0812 07/14/23 1054 07/14/23 1609 07/14/23 2108 07/15/23 0824  GLUCAP 86 177* 247* 210* 89    Iron  Studies:  No results for input(s): IRON , TIBC,  TRANSFERRIN, FERRITIN in the last 72 hours.  Studies/Results: No results found.   Medications: Infusions:  furosemide  120 mg (07/15/23 0855)    Scheduled Medications:  aspirin  EC  81 mg Oral Daily   carvedilol   6.25 mg Oral BID WC   Chlorhexidine  Gluconate Cloth  6 each Topical Daily   darbepoetin (ARANESP ) injection - DIALYSIS  100 mcg Subcutaneous Q Sat-1800   heparin   5,000 Units Subcutaneous Q8H   insulin  aspart  0-5 Units Subcutaneous QHS   insulin  aspart  0-6 Units Subcutaneous TID WC   levETIRAcetam   500 mg Oral BID   metolazone   5 mg Oral Daily   sodium chloride  flush  3 mL Intravenous Q12H    have reviewed scheduled and prn medications.  Physical Exam: General:NAD, comfortable Heart:RRR, s1s2 nl Lungs: Bilateral reduced breath sound, no rales Abdomen:soft, Non-tender, non-distended Extremities: Both upper and lower extremities edema 1-2+ pitting Dialysis Access: No access available  [name] 07/15/2023,11:23 AM  LOS: 3 days

## 2023-07-15 NOTE — Progress Notes (Addendum)
 PROGRESS NOTE    Jill Jefferson  FMW:[id number] DOB: 1960/04/25 DOA: 07/11/2023 PCP: Almarie Waddell [name], NP  63/F with history of CKD 5, seizure disorder, chronic diastolic CHF, cataract presented to the ED with shortness of breath and hypoglycemia, has not followed up with nephrology in the last 1 to 2 years.  Compliant with home dose of Lasix , in the ER hypertensive, hypoxic placed on BiPAP initially and then weaned down to 6 L O2, creatinine 11.5, BUN 80, hemoglobin 8.4, proBNP 33608, troponin 108, 99 chest x-ray with increased moderate left and small right effusions, CT abdomen pelvis noted bilateral pleural effusions, generalized subcutaneous edema, ascites, admitted, started on diuretics -Nephrology consulting   Subjective: Feels better, breathing is little better, still with significant swelling  Assessment and Plan:  Acute respiratory failure with hypoxia Anasarca AKI on CKD 5 - Admitted with significant volume overload, progressive CKD 5, near ESRD -Weaned off BiPAP -Nephrology consulting, on high-dose IV Lasix , metolazone , limited response  - Remains significantly volume overloaded, discussed with patient regarding need to start dialysis this admission, seems to be reluctant  Acute on chronic systolic and diastolic CHF -In setting of progressive CKD 5/ESRD -Echo in 2022 with EF 65%, G1 DD, normal RV function. -Repeat echo with EF down to 45%, some wall motion abnormalities noted, not appropriate for cardiac cath at this setting with advanced CKD 5 - Medical management, diuretics as above, will need cardiology follow-up - Changed amlodipine  to Coreg , started aspirin    Hypertension - See above   Seizure disorder - Continue Keppra   DVT prophylaxis: Heparin  subcutaneous Code Status: Full code Family Communication: None present, left msg for daughter Disposition Plan: Home pending improvement, ideally starting HD   Objective: Vitals:   07/15/23 0500 07/15/23 0700 07/15/23  0854 07/15/23 1201  BP: (!) 148/75 133/73  128/68  Pulse:  80  70  Resp: 17 20  17   Temp: 97.8 F (36.6 C) 98 F (36.7 C)  97.8 F (36.6 C)  TempSrc: Oral Oral  Oral  SpO2:  98%  98%  Weight:   67.8 kg   Height:        Intake/Output Summary (Last 24 hours) at 07/15/2023 1239 Last data filed at 07/15/2023 1153 Gross per 24 hour  Intake 1254.73 ml  Output 2025 ml  Net -770.27 ml   Filed Weights   07/13/23 0548 07/14/23 0500 07/15/23 0854  Weight: 69.7 kg 68.5 kg 67.8 kg    Examination:  General exam: Appears calm and comfortable, AO x 3 HEENT: Positive JVD CVS: S1-S2, regular rhythm Lungs: Decreased breath sounds at the bases Abdomen: Soft, nontender, bowel sounds present, abdominal wall edema Extremities: Diffuse 2+ edema Skin: No rashes Psychiatry:  Mood & affect appropriate.     Data Reviewed:   CBC: Recent Labs  Lab 07/11/23 1625 07/11/23 1632 07/13/23 0257 07/15/23 0412  WBC 6.8  --  6.8 6.8  NEUTROABS 5.6  --   --   --   HGB 8.4* 8.8* 8.2* 7.9*  HCT 27.5* 26.0* 26.3* 25.9*  MCV 79.3*  --  79.9* 79.9*  PLT 293  --  289 278   Basic Metabolic Panel: Recent Labs  Lab 07/11/23 1625 07/11/23 1632 07/12/23 1618 07/13/23 0257 07/14/23 0826 07/15/23 0412  NA 141 140  --  137 135 138  K 4.4 4.3  --  3.9 3.8 4.0  CL 107  --   --  106 101 102  CO2 21*  --   --  22  23 24  GLUCOSE 103*  --   --  143* 84 110*  BUN 86*  --   --  84* 86* 91*  CREATININE 11.50*  --  11.04* 11.20* 10.90* 11.10*  CALCIUM  8.8*  --   --  7.9* 7.7* 7.7*  PHOS  --   --   --  6.2* 6.4* 6.6*   GFR: Estimated Creatinine Clearance: 4.6 mL/min (A) (by C-G formula based on SCr of 11.1 mg/dL (H)). Liver Function Tests: Recent Labs  Lab 07/11/23 1625 07/13/23 0257 07/14/23 0826 07/15/23 0412  AST 24  --   --   --   ALT 32  --   --   --   ALKPHOS 79  --   --   --   BILITOT 0.2  --   --   --   PROT 6.2*  --   --   --   ALBUMIN 3.3* 2.1* 2.2* 2.1*   No results for input(s):  LIPASE, AMYLASE in the last 168 hours. No results for input(s): AMMONIA in the last 168 hours. Coagulation Profile: No results for input(s): INR, PROTIME in the last 168 hours. Cardiac Enzymes: No results for input(s): CKTOTAL, CKMB, CKMBINDEX, TROPONINI in the last 168 hours. BNP (last 3 results) Recent Labs    07/11/23 1625  PROBNP 33,608.0*   HbA1C: No results for input(s): HGBA1C in the last 72 hours. CBG: Recent Labs  Lab 07/14/23 1054 07/14/23 1609 07/14/23 2108 07/15/23 0824 07/15/23 1203  GLUCAP 177* 247* 210* 89 162*   Lipid Profile: No results for input(s): CHOL, HDL, LDLCALC, TRIG, CHOLHDL, LDLDIRECT in the last 72 hours. Thyroid  Function Tests: No results for input(s): TSH, T4TOTAL, FREET4, T3FREE, THYROIDAB in the last 72 hours. Anemia Panel: No results for input(s): VITAMINB12, FOLATE, FERRITIN, TIBC, IRON , RETICCTPCT in the last 72 hours.  Urine analysis:    Component Value Date/Time   COLORURINE STRAW (A) 01/01/2023 1601   APPEARANCEUR CLEAR 01/01/2023 1601   LABSPEC 1.010 01/01/2023 1601   PHURINE 7.0 01/01/2023 1601   GLUCOSEU 50 (A) 01/01/2023 1601   HGBUR SMALL (A) 01/01/2023 1601   BILIRUBINUR NEGATIVE 01/01/2023 1601   BILIRUBINUR neg 06/23/2019 1148   KETONESUR NEGATIVE 01/01/2023 1601   PROTEINUR >=300 (A) 01/01/2023 1601   UROBILINOGEN 0.2 06/23/2019 1148   NITRITE NEGATIVE 01/01/2023 1601   LEUKOCYTESUR NEGATIVE 01/01/2023 1601   Sepsis Labs: @LABRCNTIP (procalcitonin:4,lacticidven:4)  ) Recent Results (from the past 240 hours)  Resp panel by RT-PCR (RSV, Flu A&B, Covid) Anterior Nasal Swab     Status: None   Collection Time: 07/11/23  4:25 PM   Specimen: Anterior Nasal Swab  Result Value Ref Range Status   SARS Coronavirus 2 by RT PCR NEGATIVE NEGATIVE Final    Comment: (NOTE) SARS-CoV-2 target nucleic acids are NOT DETECTED.  The SARS-CoV-2 RNA is generally detectable in upper  respiratory specimens during the acute phase of infection. The lowest concentration of SARS-CoV-2 viral copies this assay can detect is 138 copies/mL. A negative result does not preclude SARS-Cov-2 infection and should not be used as the sole basis for treatment or other patient management decisions. A negative result may occur with  improper specimen collection/handling, submission of specimen other than nasopharyngeal swab, presence of viral mutation(s) within the areas targeted by this assay, and inadequate number of viral copies(<138 copies/mL). A negative result must be combined with clinical observations, patient history, and epidemiological information. The expected result is Negative.  Fact Sheet for Patients:  BloggerCourse.com  Fact Sheet for Healthcare Providers:  SeriousBroker.it  This test is no t yet approved or cleared by the [place]  FDA and  has been authorized for detection and/or diagnosis of SARS-CoV-2 by FDA under an Emergency Use Authorization (EUA). This EUA will remain  in effect (meaning this test can be used) for the duration of the COVID-19 declaration under Section 564(b)(1) of the Act, 21 U.S.C.section 360bbb-3(b)(1), unless the authorization is terminated  or revoked sooner.       Influenza A by PCR NEGATIVE NEGATIVE Final   Influenza B by PCR NEGATIVE NEGATIVE Final    Comment: (NOTE) The Xpert Xpress SARS-CoV-2/FLU/RSV plus assay is intended as an aid in the diagnosis of influenza from Nasopharyngeal swab specimens and should not be used as a sole basis for treatment. Nasal washings and aspirates are unacceptable for Xpert Xpress SARS-CoV-2/FLU/RSV testing.  Fact Sheet for Patients: BloggerCourse.com  Fact Sheet for Healthcare Providers: SeriousBroker.it  This test is not yet approved or cleared by the [place]  FDA and has been  authorized for detection and/or diagnosis of SARS-CoV-2 by FDA under an Emergency Use Authorization (EUA). This EUA will remain in effect (meaning this test can be used) for the duration of the COVID-19 declaration under Section 564(b)(1) of the Act, 21 U.S.C. section 360bbb-3(b)(1), unless the authorization is terminated or revoked.     Resp Syncytial Virus by PCR NEGATIVE NEGATIVE Final    Comment: (NOTE) Fact Sheet for Patients: BloggerCourse.com  Fact Sheet for Healthcare Providers: SeriousBroker.it  This test is not yet approved or cleared by the [place]  FDA and has been authorized for detection and/or diagnosis of SARS-CoV-2 by FDA under an Emergency Use Authorization (EUA). This EUA will remain in effect (meaning this test can be used) for the duration of the COVID-19 declaration under Section 564(b)(1) of the Act, 21 U.S.C. section 360bbb-3(b)(1), unless the authorization is terminated or revoked.  Performed at [facility], [address], [place], [place] [address]      Radiology Studies: No results found.    Scheduled Meds:  aspirin  EC  81 mg Oral Daily   calcitRIOL   0.25 mcg Oral Daily   carvedilol   6.25 mg Oral BID WC   Chlorhexidine  Gluconate Cloth  6 each Topical Daily   darbepoetin (ARANESP ) injection - DIALYSIS  100 mcg Subcutaneous Q Sat-1800   heparin   5,000 Units Subcutaneous Q8H   insulin  aspart  0-5 Units Subcutaneous QHS   insulin  aspart  0-6 Units Subcutaneous TID WC   levETIRAcetam   500 mg Oral BID   metolazone   5 mg Oral Daily   sodium chloride  flush  3 mL Intravenous Q12H   Continuous Infusions:  furosemide  Stopped (07/15/23 0955)     LOS: 3 days    Time spent:    Sigurd Pac, MD [facility]   07/15/2023, 12:39 PM

## 2023-07-15 NOTE — TOC Initial Note (Signed)
 Transition of Care [facility]) - Initial/Assessment Note    Patient Details  Name: Jill Jefferson MRN: [id number] Date of Birth: 1960/11/08  Transition of Care [facility]) CM/SW Contact:    Sudie Erminio Deems, RN Phone Number: 07/15/2023, 2:58 PM  Clinical Narrative: Patient presented for shortness of breath and hypoglycemia. PTA patient reports that she is independent from home with daughter. Patient does not use any DME at this time. Patient states she is without a PCP at this time and wants to choose the PCP that she is comfortable with. She states daughter is helping with choosing PCP. Nephrology is following the patient. Case Manager will continue to follow for additional transition of care needs as the patient progresses.                   Expected Discharge Plan: Home/Self Care Barriers to Discharge: Continued Medical Work up   Patient Goals and CMS Choice Patient states their goals for this hospitalization and ongoing recovery are:: patient plans to return home once stable.   Choice offered to / list presented to : NA      Expected Discharge Plan and Services In-house Referral: NA Discharge Planning Services: CM Consult Post Acute Care Choice: NA Living arrangements for the past 2 months: Apartment                   DME Agency: NA       HH Arranged: NA          Prior Living Arrangements/Services Living arrangements for the past 2 months: Apartment Lives with:: Self, Adult Children Patient language and need for interpreter reviewed:: Yes Do you feel safe going back to the place where you live?: Yes      Need for Family Participation in Patient Care: No (Comment) Care giver support system in place?: No (comment)   Criminal Activity/Legal Involvement Pertinent to Current Situation/Hospitalization: No - Comment as needed  Activities of Daily Living   ADL Screening (condition at time of admission) Independently performs ADLs?: Yes (appropriate for developmental  age) Is the patient deaf or have difficulty hearing?: No Does the patient have difficulty seeing, even when wearing glasses/contacts?: No Does the patient have difficulty concentrating, remembering, or making decisions?: No  Permission Sought/Granted Permission sought to share information with : Family Supports, Case Manager                Emotional Assessment Appearance:: Appears stated age Attitude/Demeanor/Rapport: Engaged Affect (typically observed): Appropriate Orientation: : Oriented to Self, Oriented to Place, Oriented to  Time, Oriented to Situation Alcohol / Substance Use: Not Applicable Psych Involvement: No (comment)  Admission diagnosis:  Respiratory distress [R06.03] Renal insufficiency [N28.9] Acute respiratory failure with hypoxia (HCC) [J96.01] AKI (acute kidney injury) (HCC) [N17.9] Acute congestive heart failure, unspecified heart failure type (HCC) [I50.9] Patient Active Problem List   Diagnosis Date Noted   Acute respiratory failure with hypoxia (HCC) 07/12/2023   History of status epilepticus 07/12/2023   Acute on chronic diastolic CHF (congestive heart failure) (HCC) 07/12/2023   Murmur, cardiac 04/02/2023   Hypertension    CKD (chronic kidney disease) stage 4, GFR 15-29 ml/min (HCC)    Seizure disorder (HCC)    Acute renal failure superimposed on stage 4 chronic kidney disease (HCC) 09/01/2020   Hemoglobin A1c less than 7.0% 11/26/2018   Hyperglycemia 11/26/2018   Proteinuria 11/26/2018   Joint pain in fingers of both hands 08/03/2018   Bilateral lower extremity edema 05/03/2018   Uncontrolled type  2 diabetes mellitus with hyperglycemia, with long-term current use of insulin  (HCC) 01/31/2018   Age-related cataract of both eyes 01/31/2018   PCP:  Almarie Waddell [name], NP Pharmacy:   [facility] [phone] [address], [place] - [address] [place] [address] Phone: [phone] Fax: [phone]     Social Drivers of  Health (SDOH) Social History: SDOH Screenings   Food Insecurity: No Food Insecurity (07/12/2023)  Housing: Patient Declined (07/12/2023)  Transportation Needs: Patient Declined (07/12/2023)  Utilities: Patient Declined (07/12/2023)  Depression (PHQ2-9): Low Risk  (02/25/2019)  Tobacco Use: Low Risk  (07/11/2023)   SDOH Interventions:     Readmission Risk Interventions     No data to display

## 2023-07-16 DIAGNOSIS — N184 Chronic kidney disease, stage 4 (severe): Secondary | ICD-10-CM | POA: Diagnosis not present

## 2023-07-16 DIAGNOSIS — N179 Acute kidney failure, unspecified: Secondary | ICD-10-CM | POA: Diagnosis not present

## 2023-07-16 LAB — RENAL FUNCTION PANEL
Albumin: 2.1 g/dL — ABNORMAL LOW (ref 3.5–5.0)
Anion gap: 12 (ref 5–15)
BUN: 97 mg/dL — ABNORMAL HIGH (ref 8–23)
CO2: 23 mmol/L (ref 22–32)
Calcium: 7.3 mg/dL — ABNORMAL LOW (ref 8.9–10.3)
Chloride: 98 mmol/L (ref 98–111)
Creatinine, Ser: 10.94 mg/dL — ABNORMAL HIGH (ref 0.44–1.00)
GFR, Estimated: 4 mL/min — ABNORMAL LOW (ref >60)
Glucose, Bld: 180 mg/dL — ABNORMAL HIGH (ref 70–99)
Phosphorus: 6.8 mg/dL — ABNORMAL HIGH (ref 2.5–4.6)
Potassium: 3.9 mmol/L (ref 3.5–5.1)
Sodium: 133 mmol/L — ABNORMAL LOW (ref 135–145)

## 2023-07-16 LAB — GLUCOSE, CAPILLARY
Glucose-Capillary: 185 mg/dL — ABNORMAL HIGH (ref 70–99)
Glucose-Capillary: 182 mg/dL — ABNORMAL HIGH (ref 70–99)
Glucose-Capillary: 301 mg/dL — ABNORMAL HIGH (ref 70–99)
Glucose-Capillary: 172 mg/dL — ABNORMAL HIGH (ref 70–99)

## 2023-07-16 LAB — CBC
HCT: 25.9 % — ABNORMAL LOW (ref 36.0–46.0)
Hemoglobin: 8 g/dL — ABNORMAL LOW (ref 12.0–15.0)
MCH: 24.1 pg — ABNORMAL LOW (ref 26.0–34.0)
MCHC: 30.9 g/dL (ref 30.0–36.0)
MCV: 78 fL — ABNORMAL LOW (ref 80.0–100.0)
Platelets: 248 K/uL (ref 150–400)
RBC: 3.32 MIL/uL — ABNORMAL LOW (ref 3.87–5.11)
RDW: 14.4 % (ref 11.5–15.5)
WBC: 7.3 K/uL (ref 4.0–10.5)
nRBC: 0 % (ref 0.0–0.2)

## 2023-07-16 NOTE — Progress Notes (Addendum)
 Jill Jefferson  Assessment/ Plan: Pt is a 63 y.o. yo female  with PMH significant for hypertension, diabetes, CKD 5 with poor outpatient follow-up presented with worsening shortness of breath, peripheral edema for last few weeks.  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   -Patient is unwilling to start dialysis at this time.  She is responding to high-dose IV diuretics and has no frank uremic symptoms so ok to hold but my concern is ability to maintain euvolemia on oral diuretics - she wishes to try to avoid dialysis still so we'll attempt -Continue Lasix  IV, metolazone  and strict ins and outs.  In next few days plan transition to oral diuretics to see if she can maintain.  She still has some volume on so I prefer IV at least 1 more day.  -she is working full time as a Geophysicist/field seismologist. Discussed dialysis options with her 7/7 as well as future transplant planning.  Encouraged her she if she doesn't need HD this admission it will be in near future and she will need f/u with [facility] or other nephrologist. -d/c foley today, PVR qshift x 1 day, still needs strict I/Os done - d/w patient  # Acute on chronic diastolic CHF with fluid overload: Elevated BNP level.  Echo with EF of 40 to 45%.  Mild LVH.  Diuretics as above.  Meds limited by GFR.    # Hypertension/volume: BP improving with diuresis.    # CKD-MBD/hyperphosphatemia: phos 6.6, corr ca 9.3, PTH 433, started calcitriol  0.25 daily.  On sevelemer.    # Anemia of chronic disease/iron  deficiency anemia: Iron  saturation 5% with serum iron  of 17 and ferritin 41.  On IV iron  and Aranesp .  Subjective: Seen and examined at bedside.  Urine output is around 1.9 L, net neg 4.6 for admission per I/Os.  Feels edema improving.  No further orthopnea.  Denies dysgeusia,  hiccups, nausea, difficulty with cognition.   Objective Vital signs in last 24 hours: Vitals:   07/15/23 1643 07/15/23 2058 07/16/23 0700 07/16/23 0835  BP: (!) 143/80 130/75 (!) 141/63   Pulse: 76  73   Resp: 20 20 18    Temp: 97.8 F (36.6 C) 98.8 F (37.1 C) 98.3 F (36.8 C)   TempSrc: Oral Oral Oral   SpO2: 100%  100%   Weight:    67.5 kg  Height:       Weight change:   Intake/Output Summary (Last 24 hours) at 07/16/2023 0954 Last data filed at 07/16/2023 0646 Gross per 24 hour  Intake 766.49 ml  Output 1625 ml  Net -858.51 ml       Labs: RENAL PANEL Recent Labs  Lab 07/11/23 1625 07/11/23 1632 07/12/23 1618 07/13/23 0257 07/14/23 0826 07/15/23 0412 07/16/23 0540  NA 141 140  --  137 135 138 133*  K 4.4 4.3  --  3.9 3.8 4.0 3.9  CL 107  --   --  106 101 102 98  CO2 21*  --   --  22 23 24 23   GLUCOSE 103*  --   --  143* 84 110* 180*  BUN 86*  --   --  84* 86* 91* 97*  CREATININE 11.50*  --  11.04* 11.20* 10.90* 11.10* 10.94*  CALCIUM  8.8*  --   --  7.9* 7.7* 7.7*  7.3*  PHOS  --   --   --  6.2* 6.4* 6.6* 6.8*  ALBUMIN 3.3*  --   --  2.1* 2.2* 2.1* 2.1*    Liver Function Tests: Recent Labs  Lab 07/11/23 1625 07/13/23 0257 07/14/23 0826 07/15/23 0412 07/16/23 0540  AST 24  --   --   --   --   ALT 32  --   --   --   --   ALKPHOS 79  --   --   --   --   BILITOT 0.2  --   --   --   --   PROT 6.2*  --   --   --   --   ALBUMIN 3.3*   < > 2.2* 2.1* 2.1*   < > = values in this interval not displayed.   No results for input(s): LIPASE, AMYLASE in the last 168 hours. No results for input(s): AMMONIA in the last 168 hours. CBC: Recent Labs    07/11/23 1625 07/11/23 1632 07/11/23 1953 07/13/23 0257 07/15/23 0412 07/16/23 0540  HGB 8.4* 8.8*  --  8.2* 7.9* 8.0*  MCV 79.3*  --   --  79.9* 79.9* 78.0*  VITAMINB12  --   --  1,362*  --   --   --   FOLATE  --   --  16.8  --   --   --   FERRITIN  --   --  41  --   --   --   TIBC  --   --  321  --    --   --   IRON   --   --  17*  --   --   --   RETICCTPCT  --   --  1.6  --   --   --     Cardiac Enzymes: No results for input(s): CKTOTAL, CKMB, CKMBINDEX, TROPONINI in the last 168 hours. CBG: Recent Labs  Lab 07/15/23 0824 07/15/23 1203 07/15/23 1647 07/15/23 2118 07/16/23 0755  GLUCAP 89 162* 246* 248* 185*    Iron  Studies:  No results for input(s): IRON , TIBC, TRANSFERRIN, FERRITIN in the last 72 hours.  Studies/Results: No results found.   Medications: Infusions:  furosemide  120 mg (07/16/23 0832)    Scheduled Medications:  aspirin  EC  81 mg Oral Daily   calcitRIOL   0.25 mcg Oral Daily   carvedilol   6.25 mg Oral BID WC   Chlorhexidine  Gluconate Cloth  6 each Topical Daily   darbepoetin (ARANESP ) injection - DIALYSIS  100 mcg Subcutaneous Q Sat-1800   heparin   5,000 Units Subcutaneous Q8H   insulin  aspart  0-5 Units Subcutaneous QHS   insulin  aspart  0-6 Units Subcutaneous TID WC   levETIRAcetam   500 mg Oral BID   metolazone   5 mg Oral Daily   sodium chloride  flush  3 mL Intravenous Q12H    have reviewed scheduled and prn medications.  Physical Exam: General:NAD, comfortable Heart:RRR, s1s2 nl Lungs: Bilateral reduced breath sound, no rales Abdomen:soft, Non-tender, non-distended Extremities: Both upper and lower extremities edema 1-2+ pitting - now just mainly ankles and feet Dialysis Access: No access available Foley in place  Jill Jefferson 07/16/2023,9:54 AM  LOS: 4 days

## 2023-07-16 NOTE — Progress Notes (Addendum)
 PROGRESS NOTE    Jill Jefferson  FMW:[id number] DOB: 02-11-1960 DOA: 07/11/2023 PCP: Almarie Waddell [name], NP  63/F with history of CKD 5, seizure disorder, chronic diastolic CHF, cataract presented to the ED with shortness of breath and hypoglycemia, has not followed up with nephrology in the last 1 to 2 years.  Compliant with home dose of Lasix , in the ER hypertensive, hypoxic placed on BiPAP initially and then weaned down to 6 L O2, creatinine 11.5, BUN 80, hemoglobin 8.4, proBNP 33608, troponin 108, 99 chest x-ray with increased moderate left and small right effusions, CT abdomen pelvis noted bilateral pleural effusions, generalized subcutaneous edema, ascites, admitted, started on diuretics -Nephrology consulting - Extremely reluctant to start dialysis this admission   Subjective: Feels fair, reports that her dyspnea is a little better, denies nausea or vomiting  Assessment and Plan:  Acute respiratory failure with hypoxia Anasarca AKI on CKD 5 - Admitted with significant volume overload, progressive CKD 5, near ESRD -Weaned off BiPAP -Nephrology consulting, on high-dose IV Lasix , metolazone , 4.6L negative, some response but not optimal  - Remains volume overloaded, discussed with patient regarding need to start dialysis this admission, seems to be reluctant, nephrology following  Acute on chronic systolic and diastolic CHF -In setting of progressive CKD 5/ESRD -Echo in 2022 with EF 65%, G1 DD, normal RV function. -Repeat echo with EF down to 45%, some wall motion abnormalities noted, not appropriate for cardiac cath at this setting with advanced CKD 5 - Medical management, diuretics as above, will need cardiology follow-up - Changed amlodipine  to Coreg , started aspirin   Left arm swelling -Has anasarca all over but more pronounced in the left arm near site of IV, check Dopplers, old IV removed   Hypertension - See above   Seizure disorder - Continue Keppra   DVT prophylaxis:  Heparin  subcutaneous Code Status: Full code Family Communication: None present, left msg for daughter Byron today and yesterday Disposition Plan: Home pending improvement,   Objective: Vitals:   07/15/23 2058 07/16/23 0700 07/16/23 0835 07/16/23 1157  BP: 130/75 (!) 141/63  138/73  Pulse:  73  74  Resp: 20 18  18   Temp: 98.8 F (37.1 C) 98.3 F (36.8 C)  98.6 F (37 C)  TempSrc: Oral Oral  Oral  SpO2:  100%  95%  Weight:   67.5 kg   Height:        Intake/Output Summary (Last 24 hours) at 07/16/2023 1249 Last data filed at 07/16/2023 1000 Gross per 24 hour  Intake 718 ml  Output 1900 ml  Net -1182 ml   Filed Weights   07/14/23 0500 07/15/23 0854 07/16/23 0835  Weight: 68.5 kg 67.8 kg 67.5 kg    Examination:  General exam: Appears calm and comfortable, AO x 3 HEENT: Positive JVD CVS: S1-S2, regular rhythm Lungs: Decreased breath sounds at the bases Abdomen: Soft, nontender, bowel sounds present, abdominal wall edema Extremities: Diffuse 2+ edema, more prominent left arm edema Skin: No rashes Psychiatry:  Mood & affect appropriate.     Data Reviewed:   CBC: Recent Labs  Lab 07/11/23 1625 07/11/23 1632 07/13/23 0257 07/15/23 0412 07/16/23 0540  WBC 6.8  --  6.8 6.8 7.3  NEUTROABS 5.6  --   --   --   --   HGB 8.4* 8.8* 8.2* 7.9* 8.0*  HCT 27.5* 26.0* 26.3* 25.9* 25.9*  MCV 79.3*  --  79.9* 79.9* 78.0*  PLT 293  --  289 278 248   Basic Metabolic Panel:  Recent Labs  Lab 07/11/23 1625 07/11/23 1632 07/12/23 1618 07/13/23 0257 07/14/23 0826 07/15/23 0412 07/16/23 0540  NA 141 140  --  137 135 138 133*  K 4.4 4.3  --  3.9 3.8 4.0 3.9  CL 107  --   --  106 101 102 98  CO2 21*  --   --  22 23 24 23   GLUCOSE 103*  --   --  143* 84 110* 180*  BUN 86*  --   --  84* 86* 91* 97*  CREATININE 11.50*  --  11.04* 11.20* 10.90* 11.10* 10.94*  CALCIUM  8.8*  --   --  7.9* 7.7* 7.7* 7.3*  PHOS  --   --   --  6.2* 6.4* 6.6* 6.8*   GFR: Estimated Creatinine  Clearance: 4.6 mL/min (A) (by C-G formula based on SCr of 10.94 mg/dL (H)). Liver Function Tests: Recent Labs  Lab 07/11/23 1625 07/13/23 0257 07/14/23 0826 07/15/23 0412 07/16/23 0540  AST 24  --   --   --   --   ALT 32  --   --   --   --   ALKPHOS 79  --   --   --   --   BILITOT 0.2  --   --   --   --   PROT 6.2*  --   --   --   --   ALBUMIN 3.3* 2.1* 2.2* 2.1* 2.1*   No results for input(s): LIPASE, AMYLASE in the last 168 hours. No results for input(s): AMMONIA in the last 168 hours. Coagulation Profile: No results for input(s): INR, PROTIME in the last 168 hours. Cardiac Enzymes: No results for input(s): CKTOTAL, CKMB, CKMBINDEX, TROPONINI in the last 168 hours. BNP (last 3 results) Recent Labs    07/11/23 1625  PROBNP 33,608.0*   HbA1C: No results for input(s): HGBA1C in the last 72 hours. CBG: Recent Labs  Lab 07/15/23 1203 07/15/23 1647 07/15/23 2118 07/16/23 0755 07/16/23 1159  GLUCAP 162* 246* 248* 185* 182*   Lipid Profile: No results for input(s): CHOL, HDL, LDLCALC, TRIG, CHOLHDL, LDLDIRECT in the last 72 hours. Thyroid  Function Tests: No results for input(s): TSH, T4TOTAL, FREET4, T3FREE, THYROIDAB in the last 72 hours. Anemia Panel: No results for input(s): VITAMINB12, FOLATE, FERRITIN, TIBC, IRON , RETICCTPCT in the last 72 hours.  Urine analysis:    Component Value Date/Time   COLORURINE STRAW (A) 01/01/2023 1601   APPEARANCEUR CLEAR 01/01/2023 1601   LABSPEC 1.010 01/01/2023 1601   PHURINE 7.0 01/01/2023 1601   GLUCOSEU 50 (A) 01/01/2023 1601   HGBUR SMALL (A) 01/01/2023 1601   BILIRUBINUR NEGATIVE 01/01/2023 1601   BILIRUBINUR neg 06/23/2019 1148   KETONESUR NEGATIVE 01/01/2023 1601   PROTEINUR >=300 (A) 01/01/2023 1601   UROBILINOGEN 0.2 06/23/2019 1148   NITRITE NEGATIVE 01/01/2023 1601   LEUKOCYTESUR NEGATIVE 01/01/2023 1601   Sepsis  Labs: @LABRCNTIP (procalcitonin:4,lacticidven:4)  ) Recent Results (from the past 240 hours)  Resp panel by RT-PCR (RSV, Flu A&B, Covid) Anterior Nasal Swab     Status: None   Collection Time: 07/11/23  4:25 PM   Specimen: Anterior Nasal Swab  Result Value Ref Range Status   SARS Coronavirus 2 by RT PCR NEGATIVE NEGATIVE Final    Comment: (NOTE) SARS-CoV-2 target nucleic acids are NOT DETECTED.  The SARS-CoV-2 RNA is generally detectable in upper respiratory specimens during the acute phase of infection. The lowest concentration of SARS-CoV-2 viral copies this assay can detect is 138  copies/mL. A negative result does not preclude SARS-Cov-2 infection and should not be used as the sole basis for treatment or other patient management decisions. A negative result may occur with  improper specimen collection/handling, submission of specimen other than nasopharyngeal swab, presence of viral mutation(s) within the areas targeted by this assay, and inadequate number of viral copies(<138 copies/mL). A negative result must be combined with clinical observations, patient history, and epidemiological information. The expected result is Negative.  Fact Sheet for Patients:  BloggerCourse.com  Fact Sheet for Healthcare Providers:  SeriousBroker.it  This test is no t yet approved or cleared by the [place]  FDA and  has been authorized for detection and/or diagnosis of SARS-CoV-2 by FDA under an Emergency Use Authorization (EUA). This EUA will remain  in effect (meaning this test can be used) for the duration of the COVID-19 declaration under Section 564(b)(1) of the Act, 21 U.S.C.section 360bbb-3(b)(1), unless the authorization is terminated  or revoked sooner.       Influenza A by PCR NEGATIVE NEGATIVE Final   Influenza B by PCR NEGATIVE NEGATIVE Final    Comment: (NOTE) The Xpert Xpress SARS-CoV-2/FLU/RSV plus assay is intended as  an aid in the diagnosis of influenza from Nasopharyngeal swab specimens and should not be used as a sole basis for treatment. Nasal washings and aspirates are unacceptable for Xpert Xpress SARS-CoV-2/FLU/RSV testing.  Fact Sheet for Patients: BloggerCourse.com  Fact Sheet for Healthcare Providers: SeriousBroker.it  This test is not yet approved or cleared by the [place]  FDA and has been authorized for detection and/or diagnosis of SARS-CoV-2 by FDA under an Emergency Use Authorization (EUA). This EUA will remain in effect (meaning this test can be used) for the duration of the COVID-19 declaration under Section 564(b)(1) of the Act, 21 U.S.C. section 360bbb-3(b)(1), unless the authorization is terminated or revoked.     Resp Syncytial Virus by PCR NEGATIVE NEGATIVE Final    Comment: (NOTE) Fact Sheet for Patients: BloggerCourse.com  Fact Sheet for Healthcare Providers: SeriousBroker.it  This test is not yet approved or cleared by the [place]  FDA and has been authorized for detection and/or diagnosis of SARS-CoV-2 by FDA under an Emergency Use Authorization (EUA). This EUA will remain in effect (meaning this test can be used) for the duration of the COVID-19 declaration under Section 564(b)(1) of the Act, 21 U.S.C. section 360bbb-3(b)(1), unless the authorization is terminated or revoked.  Performed at [facility], [address] [place], [place] [address]      Radiology Studies: No results found.    Scheduled Meds:  aspirin  EC  81 mg Oral Daily   calcitRIOL   0.25 mcg Oral Daily   carvedilol   6.25 mg Oral BID WC   darbepoetin (ARANESP ) injection - DIALYSIS  100 mcg Subcutaneous Q Sat-1800   heparin   5,000 Units Subcutaneous Q8H   insulin  aspart  0-5 Units Subcutaneous QHS   insulin  aspart  0-6 Units Subcutaneous TID WC    levETIRAcetam   500 mg Oral BID   metolazone   5 mg Oral Daily   sodium chloride  flush  3 mL Intravenous Q12H   Continuous Infusions:  furosemide  120 mg (07/16/23 0832)     LOS: 4 days    Time spent:    Sigurd Pac, MD [facility]   07/16/2023, 12:49 PM

## 2023-07-17 ENCOUNTER — Inpatient Hospital Stay (HOSPITAL_COMMUNITY)

## 2023-07-17 DIAGNOSIS — M7989 Other specified soft tissue disorders: Secondary | ICD-10-CM

## 2023-07-17 DIAGNOSIS — N185 Chronic kidney disease, stage 5: Secondary | ICD-10-CM

## 2023-07-17 DIAGNOSIS — N179 Acute kidney failure, unspecified: Secondary | ICD-10-CM | POA: Diagnosis not present

## 2023-07-17 LAB — GLUCOSE, CAPILLARY
Glucose-Capillary: 151 mg/dL — ABNORMAL HIGH (ref 70–99)
Glucose-Capillary: 194 mg/dL — ABNORMAL HIGH (ref 70–99)
Glucose-Capillary: 277 mg/dL — ABNORMAL HIGH (ref 70–99)
Glucose-Capillary: 185 mg/dL — ABNORMAL HIGH (ref 70–99)

## 2023-07-17 LAB — RENAL FUNCTION PANEL
Albumin: 2.2 g/dL — ABNORMAL LOW (ref 3.5–5.0)
Anion gap: 13 (ref 5–15)
BUN: 100 mg/dL — ABNORMAL HIGH (ref 8–23)
CO2: 23 mmol/L (ref 22–32)
Calcium: 7.7 mg/dL — ABNORMAL LOW (ref 8.9–10.3)
Chloride: 97 mmol/L — ABNORMAL LOW (ref 98–111)
Creatinine, Ser: 11.27 mg/dL — ABNORMAL HIGH (ref 0.44–1.00)
GFR, Estimated: 4 mL/min — ABNORMAL LOW (ref >60)
Glucose, Bld: 162 mg/dL — ABNORMAL HIGH (ref 70–99)
Phosphorus: 6.5 mg/dL — ABNORMAL HIGH (ref 2.5–4.6)
Potassium: 3.7 mmol/L (ref 3.5–5.1)
Sodium: 133 mmol/L — ABNORMAL LOW (ref 135–145)

## 2023-07-17 LAB — CBC
HCT: 24 % — ABNORMAL LOW (ref 36.0–46.0)
Hemoglobin: 7.7 g/dL — ABNORMAL LOW (ref 12.0–15.0)
MCH: 25.2 pg — ABNORMAL LOW (ref 26.0–34.0)
MCHC: 32.1 g/dL (ref 30.0–36.0)
MCV: 78.7 fL — ABNORMAL LOW (ref 80.0–100.0)
Platelets: 248 K/uL (ref 150–400)
RBC: 3.05 MIL/uL — ABNORMAL LOW (ref 3.87–5.11)
RDW: 14.4 % (ref 11.5–15.5)
WBC: 7 K/uL (ref 4.0–10.5)
nRBC: 0 % (ref 0.0–0.2)

## 2023-07-17 LAB — TROPONIN I (HIGH SENSITIVITY)
Troponin I (High Sensitivity): 25 ng/L — ABNORMAL HIGH
Troponin I (High Sensitivity): 24 ng/L — ABNORMAL HIGH (ref <18)

## 2023-07-17 MED ORDER — TORSEMIDE 20 MG PO TABS
100.0000 mg | ORAL_TABLET | Freq: Two times a day (BID) | ORAL | Status: DC
  Administered 2023-07-17 – 2023-07-18 (×2): 100 mg via ORAL
  Filled 2023-07-17 (×2): qty 5

## 2023-07-17 NOTE — Progress Notes (Signed)
 Pt states she is having a full, pressure like sensation in her chest. She states she is not having any pain or shortness of breath at this time. Just feel like there may be some fluid there. MD notified.

## 2023-07-17 NOTE — Progress Notes (Signed)
 [facility] NEPHROLOGY PROGRESS NOTE  Assessment/ Plan: Pt is a 63 y.o. yo female  with PMH significant for hypertension, diabetes, CKD 5 with poor outpatient follow-up presented with worsening shortness of breath, peripheral edema for last few weeks.  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   -Patient is unwilling to start dialysis at this time.  She is responding to high dose diuretics and has no frank uremic symptoms so ok to hold but my concern is ability to maintain euvolemia on oral diuretics - she wishes to try to avoid dialysis still so we'll attempt.  Start torsemide  100 BID and cont metolazone  5 daily -she is working full time as a Geophysicist/field seismologist. Discussed dialysis options with her 7/7 as well as future transplant planning.  Encouraged her she if she doesn't need HD this admission it will be in near future and she will need f/u with [facility] or other nephrologist.  # Acute on chronic diastolic CHF with fluid overload: Elevated BNP level.  Echo with EF of 40 to 45%.  Mild LVH.  Diuretics as above.  Meds limited by GFR.    # Hypertension/volume: BP improving with diuresis.    # CKD-MBD/hyperphosphatemia: phos 6.6, corr ca 9.3, PTH 433, started calcitriol  0.25 daily.  On sevelemer.    # Anemia of chronic disease/iron  deficiency anemia: Iron  saturation 5% with serum iron  of 17 and ferritin 41.  On IV iron  and Aranesp .  Subjective: Seen and examined at bedside.  Urine output is around 1.5 L, net neg 5 for admission per I/Os.  Edema improving.  No further orthopnea.  Foley out yesterday, no difficulty voiding.  Denies dysgeusia, hiccups, nausea, difficulty with cognition.   Objective Vital signs in last 24 hours: Vitals:   07/16/23 2035 07/17/23 0207 07/17/23 0413 07/17/23 0751  BP: 132/66 130/62 137/63 (!) 139/59  Pulse:  73  79 80  Resp: 18 18 20 20   Temp: 98.7 F (37.1 C) 98.7 F (37.1 C) 98.6 F (37 C)   TempSrc: Oral Oral Oral Oral  SpO2: 94% 96% 97% 99%  Weight:  67.9 kg    Height:       Weight change: -0.272 kg  Intake/Output Summary (Last 24 hours) at 07/17/2023 0856 Last data filed at 07/17/2023 0845 Gross per 24 hour  Intake 1450 ml  Output 1475 ml  Net -25 ml       Labs: RENAL PANEL Recent Labs  Lab 07/13/23 0257 07/14/23 0826 07/15/23 0412 07/16/23 0540 07/17/23 0416  NA 137 135 138 133* 133*  K 3.9 3.8 4.0 3.9 3.7  CL 106 101 102 98 97*  CO2 22 23 24 23 23   GLUCOSE 143* 84 110* 180* 162*  BUN 84* 86* 91* 97* 100*  CREATININE 11.20* 10.90* 11.10* 10.94* 11.27*  CALCIUM  7.9* 7.7* 7.7* 7.3* 7.7*  PHOS 6.2* 6.4* 6.6* 6.8* 6.5*  ALBUMIN 2.1* 2.2* 2.1* 2.1* 2.2*    Liver Function Tests: Recent Labs  Lab 07/11/23 1625 07/13/23 0257 07/15/23 0412 07/16/23 0540 07/17/23 0416  AST 24  --   --   --   --   ALT 32  --   --   --   --   ALKPHOS 79  --   --   --   --   BILITOT 0.2  --   --   --   --  PROT 6.2*  --   --   --   --   ALBUMIN 3.3*   < > 2.1* 2.1* 2.2*   < > = values in this interval not displayed.   No results for input(s): LIPASE, AMYLASE in the last 168 hours. No results for input(s): AMMONIA in the last 168 hours. CBC: Recent Labs    07/11/23 1632 07/11/23 1953 07/13/23 0257 07/15/23 0412 07/16/23 0540 07/17/23 0416  HGB 8.8*  --  8.2* 7.9* 8.0* 7.7*  MCV  --   --  79.9* 79.9* 78.0* 78.7*  VITAMINB12  --  1,362*  --   --   --   --   FOLATE  --  16.8  --   --   --   --   FERRITIN  --  41  --   --   --   --   TIBC  --  321  --   --   --   --   IRON   --  17*  --   --   --   --   RETICCTPCT  --  1.6  --   --   --   --     Cardiac Enzymes: No results for input(s): CKTOTAL, CKMB, CKMBINDEX, TROPONINI in the last 168 hours. CBG: Recent Labs  Lab 07/16/23 0755 07/16/23 1159 07/16/23 1600 07/16/23 2134 07/17/23 0821  GLUCAP 185*  182* 301* 172* 151*    Iron  Studies:  No results for input(s): IRON , TIBC, TRANSFERRIN, FERRITIN in the last 72 hours.  Studies/Results: No results found.   Medications: Infusions:    Scheduled Medications:  aspirin  EC  81 mg Oral Daily   calcitRIOL   0.25 mcg Oral Daily   carvedilol   6.25 mg Oral BID WC   darbepoetin (ARANESP ) injection - DIALYSIS  100 mcg Subcutaneous Q Sat-1800   heparin   5,000 Units Subcutaneous Q8H   insulin  aspart  0-5 Units Subcutaneous QHS   insulin  aspart  0-6 Units Subcutaneous TID WC   levETIRAcetam   500 mg Oral BID   metolazone   5 mg Oral Daily   sodium chloride  flush  3 mL Intravenous Q12H   torsemide   100 mg Oral BID    have reviewed scheduled and prn medications.  Physical Exam: General:NAD, comfortable Heart:RRR, s1s2 nl Lungs: Bilateral reduced breath sound, no rales Abdomen:soft, Non-tender, non-distended Extremities: trace to 1+ edema now Dialysis Access: No access available   [name] 07/17/2023,8:56 AM  LOS: 5 days

## 2023-07-17 NOTE — Progress Notes (Signed)
 PROGRESS NOTE    Jill Jefferson  FMW:[id number] DOB: 01-24-1960 DOA: 07/11/2023 PCP: Almarie Waddell [name], NP     Brief Narrative:  Jill Jefferson is a 63/F with history of CKD 5, seizure disorder, chronic diastolic CHF, cataract presented to the ED with shortness of breath and hypoglycemia, has not followed up with nephrology in the last 1 to 2 years.  Compliant with home dose of Lasix , in the ER hypertensive, hypoxic placed on BiPAP initially and then weaned down to 6 L O2, creatinine 11.5, BUN 80, hemoglobin 8.4, proBNP 33608, troponin 108, 99. Chest x-ray with increased moderate left and small right effusions, CT abdomen pelvis noted bilateral pleural effusions, generalized subcutaneous edema, ascites, admitted, started on diuretics.  Nephrology has been following due to her AKI and CKD stage V.  Patient has been very reluctant to commit to hemodialysis.  New events last 24 hours / Subjective: Had chest pressure overnight, states tylenol  helped. No further chest pressure this morning.   Assessment & Plan:  Principal Problem:   Acute renal failure superimposed on stage 5 chronic kidney disease, not on chronic dialysis (HCC) Active Problems:   Uncontrolled type 2 diabetes mellitus with hyperglycemia, with long-term current use of insulin  (HCC)   Age-related cataract of both eyes   Seizure disorder (HCC)   Hypertension   Acute respiratory failure with hypoxia (HCC)   Acute on chronic diastolic CHF (congestive heart failure) (HCC)   Acute hypoxemic respiratory failure - Now off BiPAP. Now on room air   AKI on CKD stage V - Nephrology following. Patient hesitant to commit to HD at this time  - Torsemide , metolazone   - Remains volume overloaded  Acute on chronic systolic and diastolic CHF - Torsemide , coreg   Chest pain - EKG reviewed independently, has T wave inv in lateral leads, Troponin 25, 24. Resolved with tylenol .   Bilateral upper extremity swelling - Doppler negative    Seizure disorder  - Keppra    DM type 2 - SSI - Check A1c     DVT prophylaxis:  heparin  injection 5,000 Units Start: 07/12/23 1645 Code Status: Full Family Communication: None at bedside Disposition Plan: Home  Status is: Inpatient Remains inpatient appropriate because: renal failure     Antimicrobials:  Anti-infectives (From admission, onward)    None        Objective: Vitals:   07/17/23 0207 07/17/23 0413 07/17/23 0751 07/17/23 1222  BP: 130/62 137/63 (!) 139/59 128/68  Pulse:  79 80 70  Resp: 18 20 20    Temp: 98.7 F (37.1 C) 98.6 F (37 C)  97.9 F (36.6 C)  TempSrc: Oral Oral Oral Oral  SpO2: 96% 97% 99% 99%  Weight: 67.9 kg     Height:        Intake/Output Summary (Last 24 hours) at 07/17/2023 1550 Last data filed at 07/17/2023 0845 Gross per 24 hour  Intake 972 ml  Output 1200 ml  Net -228 ml   Filed Weights   07/15/23 0854 07/16/23 0835 07/17/23 0207  Weight: 67.8 kg 67.5 kg 67.9 kg    Examination:  General exam: Appears calm and comfortable  Respiratory system: Diminished breath sounds bilaterally, without respiratory distress Cardiovascular system: S1 & S2 heard, RRR. No murmurs.  Bilateral pitting pedal edema. Gastrointestinal system: Abdomen is nondistended, soft and nontender. Normal bowel sounds heard. Central nervous system: Alert and oriented. No focal neurological deficits. Speech clear.  Extremities: Symmetric in appearance  Skin: No rashes, lesions or ulcers on exposed skin  Psychiatry: Judgement and insight appear normal. Mood & affect appropriate.   Data Reviewed: I have personally reviewed following labs and imaging studies  CBC: Recent Labs  Lab 07/11/23 1625 07/11/23 1632 07/13/23 0257 07/15/23 0412 07/16/23 0540 07/17/23 0416  WBC 6.8  --  6.8 6.8 7.3 7.0  NEUTROABS 5.6  --   --   --   --   --   HGB 8.4* 8.8* 8.2* 7.9* 8.0* 7.7*  HCT 27.5* 26.0* 26.3* 25.9* 25.9* 24.0*  MCV 79.3*  --  79.9* 79.9* 78.0* 78.7*   PLT 293  --  289 278 248 248   Basic Metabolic Panel: Recent Labs  Lab 07/13/23 0257 07/14/23 0826 07/15/23 0412 07/16/23 0540 07/17/23 0416  NA 137 135 138 133* 133*  K 3.9 3.8 4.0 3.9 3.7  CL 106 101 102 98 97*  CO2 22 23 24 23 23   GLUCOSE 143* 84 110* 180* 162*  BUN 84* 86* 91* 97* 100*  CREATININE 11.20* 10.90* 11.10* 10.94* 11.27*  CALCIUM  7.9* 7.7* 7.7* 7.3* 7.7*  PHOS 6.2* 6.4* 6.6* 6.8* 6.5*   GFR: Estimated Creatinine Clearance: 4.5 mL/min (A) (by C-G formula based on SCr of 11.27 mg/dL (H)). Liver Function Tests: Recent Labs  Lab 07/11/23 1625 07/13/23 0257 07/14/23 0826 07/15/23 0412 07/16/23 0540 07/17/23 0416  AST 24  --   --   --   --   --   ALT 32  --   --   --   --   --   ALKPHOS 79  --   --   --   --   --   BILITOT 0.2  --   --   --   --   --   PROT 6.2*  --   --   --   --   --   ALBUMIN 3.3* 2.1* 2.2* 2.1* 2.1* 2.2*   No results for input(s): LIPASE, AMYLASE in the last 168 hours. No results for input(s): AMMONIA in the last 168 hours. Coagulation Profile: No results for input(s): INR, PROTIME in the last 168 hours. Cardiac Enzymes: No results for input(s): CKTOTAL, CKMB, CKMBINDEX, TROPONINI in the last 168 hours. BNP (last 3 results) Recent Labs    07/11/23 1625  PROBNP 33,608.0*   HbA1C: No results for input(s): HGBA1C in the last 72 hours. CBG: Recent Labs  Lab 07/16/23 1159 07/16/23 1600 07/16/23 2134 07/17/23 0821 07/17/23 1227  GLUCAP 182* 301* 172* 151* 194*   Lipid Profile: No results for input(s): CHOL, HDL, LDLCALC, TRIG, CHOLHDL, LDLDIRECT in the last 72 hours. Thyroid  Function Tests: No results for input(s): TSH, T4TOTAL, FREET4, T3FREE, THYROIDAB in the last 72 hours. Anemia Panel: No results for input(s): VITAMINB12, FOLATE, FERRITIN, TIBC, IRON , RETICCTPCT in the last 72 hours. Sepsis Labs: No results for input(s): PROCALCITON, LATICACIDVEN in the last 168  hours.  Recent Results (from the past 240 hours)  Resp panel by RT-PCR (RSV, Flu A&B, Covid) Anterior Nasal Swab     Status: None   Collection Time: 07/11/23  4:25 PM   Specimen: Anterior Nasal Swab  Result Value Ref Range Status   SARS Coronavirus 2 by RT PCR NEGATIVE NEGATIVE Final    Comment: (NOTE) SARS-CoV-2 target nucleic acids are NOT DETECTED.  The SARS-CoV-2 RNA is generally detectable in upper respiratory specimens during the acute phase of infection. The lowest concentration of SARS-CoV-2 viral copies this assay can detect is 138 copies/mL. A negative result does not preclude SARS-Cov-2 infection and  should not be used as the sole basis for treatment or other patient management decisions. A negative result may occur with  improper specimen collection/handling, submission of specimen other than nasopharyngeal swab, presence of viral mutation(s) within the areas targeted by this assay, and inadequate number of viral copies(<138 copies/mL). A negative result must be combined with clinical observations, patient history, and epidemiological information. The expected result is Negative.  Fact Sheet for Patients:  BloggerCourse.com  Fact Sheet for Healthcare Providers:  SeriousBroker.it  This test is no t yet approved or cleared by the [place]  FDA and  has been authorized for detection and/or diagnosis of SARS-CoV-2 by FDA under an Emergency Use Authorization (EUA). This EUA will remain  in effect (meaning this test can be used) for the duration of the COVID-19 declaration under Section 564(b)(1) of the Act, 21 U.S.C.section 360bbb-3(b)(1), unless the authorization is terminated  or revoked sooner.       Influenza A by PCR NEGATIVE NEGATIVE Final   Influenza B by PCR NEGATIVE NEGATIVE Final    Comment: (NOTE) The Xpert Xpress SARS-CoV-2/FLU/RSV plus assay is intended as an aid in the diagnosis of influenza from  Nasopharyngeal swab specimens and should not be used as a sole basis for treatment. Nasal washings and aspirates are unacceptable for Xpert Xpress SARS-CoV-2/FLU/RSV testing.  Fact Sheet for Patients: BloggerCourse.com  Fact Sheet for Healthcare Providers: SeriousBroker.it  This test is not yet approved or cleared by the [place]  FDA and has been authorized for detection and/or diagnosis of SARS-CoV-2 by FDA under an Emergency Use Authorization (EUA). This EUA will remain in effect (meaning this test can be used) for the duration of the COVID-19 declaration under Section 564(b)(1) of the Act, 21 U.S.C. section 360bbb-3(b)(1), unless the authorization is terminated or revoked.     Resp Syncytial Virus by PCR NEGATIVE NEGATIVE Final    Comment: (NOTE) Fact Sheet for Patients: BloggerCourse.com  Fact Sheet for Healthcare Providers: SeriousBroker.it  This test is not yet approved or cleared by the [place]  FDA and has been authorized for detection and/or diagnosis of SARS-CoV-2 by FDA under an Emergency Use Authorization (EUA). This EUA will remain in effect (meaning this test can be used) for the duration of the COVID-19 declaration under Section 564(b)(1) of the Act, 21 U.S.C. section 360bbb-3(b)(1), unless the authorization is terminated or revoked.  Performed at [facility], [address], [place], [place] [address]       Radiology Studies: VAS US  UPPER EXTREMITY VENOUS DUPLEX Result Date: 07/17/2023 UPPER VENOUS STUDY  Patient Name:  Jill Jefferson  Date of Exam:   07/17/2023 Medical Rec #: [id number]        Accession #:    [id number] Date of Birth: [birth year]         Patient Gender: F Patient Age:   17 years Exam Location:  [facility] Procedure:      VAS US  UPPER EXTREMITY VENOUS DUPLEX Referring Phys: SIGURD PAC  --------------------------------------------------------------------------------  Indications: Swelling, and left worse than right Other Indications: CHF with SOB. Comparison Study: No priors. Performing Technologist: Ricka Sturdivant-Jones RDMS, RVT  Examination Guidelines: A complete evaluation includes B-mode imaging, spectral Doppler, color Doppler, and power Doppler as needed of all accessible portions of each vessel. Bilateral testing is considered an integral part of a complete examination. Limited examinations for reoccurring indications may be performed as noted.  Right Findings: +----------+------------+---------+-----------+----------+-------+ RIGHT     CompressiblePhasicitySpontaneousPropertiesSummary +----------+------------+---------+-----------+----------+-------+ IJV  Full       Yes       Yes                      +----------+------------+---------+-----------+----------+-------+ Subclavian    Full       Yes       Yes                      +----------+------------+---------+-----------+----------+-------+ Axillary      Full       Yes       Yes                      +----------+------------+---------+-----------+----------+-------+ Brachial      Full                                          +----------+------------+---------+-----------+----------+-------+ Radial        Full                                          +----------+------------+---------+-----------+----------+-------+ Ulnar         Full                                          +----------+------------+---------+-----------+----------+-------+ Cephalic      Full                                          +----------+------------+---------+-----------+----------+-------+ Basilic       Full                                          +----------+------------+---------+-----------+----------+-------+  Left Findings:  +----------+------------+---------+-----------+----------+-------+ LEFT      CompressiblePhasicitySpontaneousPropertiesSummary +----------+------------+---------+-----------+----------+-------+ IJV           Full       Yes       Yes                      +----------+------------+---------+-----------+----------+-------+ Subclavian               Yes       Yes                      +----------+------------+---------+-----------+----------+-------+ Axillary      Full       Yes       Yes                      +----------+------------+---------+-----------+----------+-------+ Brachial      Full                                          +----------+------------+---------+-----------+----------+-------+ Radial        Full                                          +----------+------------+---------+-----------+----------+-------+  Cephalic      Full                                          +----------+------------+---------+-----------+----------+-------+ Basilic       Full                                          +----------+------------+---------+-----------+----------+-------+  Summary: No evidence of deep vein or superficial vein thrombosis involving the right and left upper extremities.  *See table(s) above for measurements and observations.    Preliminary       Scheduled Meds:  aspirin  EC  81 mg Oral Daily   calcitRIOL   0.25 mcg Oral Daily   carvedilol   6.25 mg Oral BID WC   darbepoetin (ARANESP ) injection - DIALYSIS  100 mcg Subcutaneous Q Sat-1800   heparin   5,000 Units Subcutaneous Q8H   insulin  aspart  0-5 Units Subcutaneous QHS   insulin  aspart  0-6 Units Subcutaneous TID WC   levETIRAcetam   500 mg Oral BID   metolazone   5 mg Oral Daily   sodium chloride  flush  3 mL Intravenous Q12H   torsemide   100 mg Oral BID   Continuous Infusions:   LOS: 5 days   Time spent: 35 minutes   Delon Hoe, DO [facility] 07/17/2023, 3:50 PM    Available via Epic secure chat 7am-7pm After these hours, please refer to coverage provider listed on [email or web address]

## 2023-07-17 NOTE — Inpatient Diabetes Management (Signed)
 Inpatient Diabetes Program Recommendations  AACE/ADA: New Consensus Statement on Inpatient Glycemic Control (2015)  Target Ranges:  Prepandial:   less than 140 mg/dL      Peak postprandial:   less than 180 mg/dL (1-2 hours)      Critically ill patients:  140 - 180 mg/dL   Lab Results  Component Value Date   GLUCAP 151 (H) 07/17/2023   HGBA1C 7.4 (H) 04/02/2023    Review of Glycemic Control  Diabetes history: DM2 Outpatient Diabetes medications: Tresiba  10-14 units QPM Current orders for Inpatient glycemic control: Novolog  0-6 TID with meals and 0-5 HS  Inpatient Diabetes Program Recommendations:    Please order HgbA1C to assess glycemic control prior to admission.  Continue to follow.  Thank you. Shona Brandy, RD, LDN, CDCES Inpatient Diabetes Coordinator [phone]

## 2023-07-18 ENCOUNTER — Encounter (HOSPITAL_COMMUNITY)

## 2023-07-18 ENCOUNTER — Other Ambulatory Visit (HOSPITAL_COMMUNITY): Payer: Self-pay

## 2023-07-18 DIAGNOSIS — N185 Chronic kidney disease, stage 5: Secondary | ICD-10-CM | POA: Diagnosis not present

## 2023-07-18 DIAGNOSIS — N179 Acute kidney failure, unspecified: Secondary | ICD-10-CM | POA: Diagnosis not present

## 2023-07-18 LAB — BASIC METABOLIC PANEL WITH GFR
Anion gap: 13 (ref 5–15)
BUN: 103 mg/dL — ABNORMAL HIGH (ref 8–23)
CO2: 23 mmol/L (ref 22–32)
Calcium: 7.8 mg/dL — ABNORMAL LOW (ref 8.9–10.3)
Chloride: 96 mmol/L — ABNORMAL LOW (ref 98–111)
Creatinine, Ser: 11.41 mg/dL — ABNORMAL HIGH (ref 0.44–1.00)
GFR, Estimated: 3 mL/min — ABNORMAL LOW (ref >60)
Glucose, Bld: 173 mg/dL — ABNORMAL HIGH (ref 70–99)
Potassium: 3.7 mmol/L (ref 3.5–5.1)
Sodium: 132 mmol/L — ABNORMAL LOW (ref 135–145)

## 2023-07-18 LAB — HEMOGLOBIN A1C
Hgb A1c MFr Bld: 7.1 % — ABNORMAL HIGH (ref 4.8–5.6)
Mean Plasma Glucose: 157 mg/dL

## 2023-07-18 LAB — GLUCOSE, CAPILLARY: Glucose-Capillary: 152 mg/dL — ABNORMAL HIGH (ref 70–99)

## 2023-07-18 MED ORDER — METOLAZONE 5 MG PO TABS
5.0000 mg | ORAL_TABLET | Freq: Every day | ORAL | 0 refills | Status: DC
  Filled 2023-07-18: qty 60, 60d supply, fill #0

## 2023-07-18 MED ORDER — TORSEMIDE 100 MG PO TABS
100.0000 mg | ORAL_TABLET | Freq: Two times a day (BID) | ORAL | 0 refills | Status: DC
  Filled 2023-07-18: qty 120, 60d supply, fill #0

## 2023-07-18 MED ORDER — CARVEDILOL 6.25 MG PO TABS
6.2500 mg | ORAL_TABLET | Freq: Two times a day (BID) | ORAL | 0 refills | Status: AC
  Filled 2023-07-18: qty 180, 90d supply, fill #0

## 2023-07-18 NOTE — Progress Notes (Signed)
 [facility] NEPHROLOGY PROGRESS NOTE  Assessment/ Plan: Pt is a 63 y.o. yo female  with PMH significant for hypertension, diabetes, CKD 5 with poor outpatient follow-up presented with worsening shortness of breath, peripheral edema for last few weeks.  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   -Patient is unwilling to start dialysis at this time.  She is responding to high dose diuretics and has no frank uremic symptoms so ok to hold.  Cont torsemide  100 BID and metolazone  5 daily -discussed high risk for rehospitalization and need for dialysis in the near furture - she acknowledges this -she is working full time as a Geophysicist/field seismologist. Discussed dialysis options with her as well as future transplant planning.  She doesn't particularly engage with this conversation.  -d/c today w f/u [facility] 1-2 weeks  # Acute on chronic diastolic CHF with fluid overload: Elevated BNP level.  Echo with EF of 40 to 45%.  Mild LVH.  Diuretics as above.  Meds limited by GFR.    # Hypertension/volume: BP improving with diuresis.    # CKD-MBD/hyperphosphatemia: phos 6.6, corr ca 9.3, PTH 433, started calcitriol  0.25 daily.  On sevelemer.    # Anemia of chronic disease/iron  deficiency anemia: Iron  saturation 5% with serum iron  of 17 and ferritin 41.  On IV iron  and Aranesp .  Will f/u outpt and make arrangements for such.   Declining dialysis and clinically improved.  OK for d/c today.  Will arrange for outpt f/u at [facility] in 1-2 weeks.   Subjective: Seen and examined at bedside.  Urine output is around 2.1 L, net neg 6.2 for admission per I/Os.  Edema improving.  No further orthopnea. Denies dysgeusia, hiccups, nausea, difficulty with cognition.   Objective Vital signs in last 24 hours: Vitals:   07/17/23 2300 07/18/23 0339 07/18/23 0450 07/18/23 0728   BP: 120/62 134/66  139/76  Pulse: 75     Resp: 18 18  16   Temp: 98.6 F (37 C) 98.9 F (37.2 C)  98.3 F (36.8 C)  TempSrc: Oral Oral  Oral  SpO2: 94%     Weight:   67.3 kg   Height:       Weight change: -0.227 kg  Intake/Output Summary (Last 24 hours) at 07/18/2023 0842 Last data filed at 07/18/2023 0453 Gross per 24 hour  Intake 972 ml  Output 1850 ml  Net -878 ml       Labs: RENAL PANEL Recent Labs  Lab 07/13/23 0257 07/14/23 0826 07/15/23 0412 07/16/23 0540 07/17/23 0416 07/18/23 0434  NA 137 135 138 133* 133* 132*  K 3.9 3.8 4.0 3.9 3.7 3.7  CL 106 101 102 98 97* 96*  CO2 22 23 24 23 23 23   GLUCOSE 143* 84 110* 180* 162* 173*  BUN 84* 86* 91* 97* 100* 103*  CREATININE 11.20* 10.90* 11.10* 10.94* 11.27* 11.41*  CALCIUM  7.9* 7.7* 7.7* 7.3* 7.7* 7.8*  PHOS 6.2* 6.4* 6.6* 6.8* 6.5*  --   ALBUMIN 2.1* 2.2* 2.1* 2.1* 2.2*  --     Liver Function Tests: Recent Labs  Lab 07/11/23 1625 07/13/23 0257 07/15/23 0412 07/16/23 0540 07/17/23 0416  AST 24  --   --   --   --   ALT 32  --   --   --   --   CICERO  79  --   --   --   --   BILITOT 0.2  --   --   --   --   PROT 6.2*  --   --   --   --   ALBUMIN 3.3*   < > 2.1* 2.1* 2.2*   < > = values in this interval not displayed.   No results for input(s): LIPASE, AMYLASE in the last 168 hours. No results for input(s): AMMONIA in the last 168 hours. CBC: Recent Labs    07/11/23 1632 07/11/23 1953 07/13/23 0257 07/15/23 0412 07/16/23 0540 07/17/23 0416  HGB 8.8*  --  8.2* 7.9* 8.0* 7.7*  MCV  --   --  79.9* 79.9* 78.0* 78.7*  VITAMINB12  --  1,362*  --   --   --   --   FOLATE  --  16.8  --   --   --   --   FERRITIN  --  41  --   --   --   --   TIBC  --  321  --   --   --   --   IRON   --  17*  --   --   --   --   RETICCTPCT  --  1.6  --   --   --   --     Cardiac Enzymes: No results for input(s): CKTOTAL, CKMB, CKMBINDEX, TROPONINI in the last 168 hours. CBG: Recent Labs  Lab  07/17/23 0821 07/17/23 1227 07/17/23 1644 07/17/23 2131 07/18/23 0725  GLUCAP 151* 194* 277* 185* 152*    Iron  Studies:  No results for input(s): IRON , TIBC, TRANSFERRIN, FERRITIN in the last 72 hours.  Studies/Results: VAS US  UPPER EXTREMITY VENOUS DUPLEX Result Date: 07/17/2023 UPPER VENOUS STUDY  Patient Name:  DREAM HARMAN  Date of Exam:   07/17/2023 Medical Rec #: [id number]        Accession #:    [id number] Date of Birth: November 28, 1960         Patient Gender: F Patient Age:   22 years Exam Location:  [facility] Procedure:      VAS US  UPPER EXTREMITY VENOUS DUPLEX Referring Phys: SIGURD PAC --------------------------------------------------------------------------------  Indications: Swelling, and left worse than right Other Indications: CHF with SOB. Comparison Study: No priors. Performing Technologist: Ricka Sturdivant-Jones RDMS, RVT  Examination Guidelines: A complete evaluation includes B-mode imaging, spectral Doppler, color Doppler, and power Doppler as needed of all accessible portions of each vessel. Bilateral testing is considered an integral part of a complete examination. Limited examinations for reoccurring indications may be performed as noted.  Right Findings: +----------+------------+---------+-----------+----------+-------+ RIGHT     CompressiblePhasicitySpontaneousPropertiesSummary +----------+------------+---------+-----------+----------+-------+ IJV           Full       Yes       Yes                      +----------+------------+---------+-----------+----------+-------+ Subclavian    Full       Yes       Yes                      +----------+------------+---------+-----------+----------+-------+ Axillary      Full       Yes       Yes                      +----------+------------+---------+-----------+----------+-------+ Brachial  Full                                           +----------+------------+---------+-----------+----------+-------+ Radial        Full                                          +----------+------------+---------+-----------+----------+-------+ Ulnar         Full                                          +----------+------------+---------+-----------+----------+-------+ Cephalic      Full                                          +----------+------------+---------+-----------+----------+-------+ Basilic       Full                                          +----------+------------+---------+-----------+----------+-------+  Left Findings: +----------+------------+---------+-----------+----------+-------+ LEFT      CompressiblePhasicitySpontaneousPropertiesSummary +----------+------------+---------+-----------+----------+-------+ IJV           Full       Yes       Yes                      +----------+------------+---------+-----------+----------+-------+ Subclavian               Yes       Yes                      +----------+------------+---------+-----------+----------+-------+ Axillary      Full       Yes       Yes                      +----------+------------+---------+-----------+----------+-------+ Brachial      Full                                          +----------+------------+---------+-----------+----------+-------+ Radial        Full                                          +----------+------------+---------+-----------+----------+-------+ Cephalic      Full                                          +----------+------------+---------+-----------+----------+-------+ Basilic       Full                                          +----------+------------+---------+-----------+----------+-------+  Summary: No evidence of deep vein  or superficial vein thrombosis involving the right and left upper extremities.  *See table(s) above for measurements and observations.  Diagnosing  physician: Debby Robertson Electronically signed by Debby Robertson on 07/17/2023 at 5:05:01 PM.    Final      Medications: Infusions:    Scheduled Medications:  aspirin  EC  81 mg Oral Daily   calcitRIOL   0.25 mcg Oral Daily   carvedilol   6.25 mg Oral BID WC   darbepoetin (ARANESP ) injection - DIALYSIS  100 mcg Subcutaneous Q Sat-1800   heparin   5,000 Units Subcutaneous Q8H   insulin  aspart  0-5 Units Subcutaneous QHS   insulin  aspart  0-6 Units Subcutaneous TID WC   levETIRAcetam   500 mg Oral BID   metolazone   5 mg Oral Daily   sodium chloride  flush  3 mL Intravenous Q12H   torsemide   100 mg Oral BID    have reviewed scheduled and prn medications.  Physical Exam: General:NAD, comfortable Heart:RRR, s1s2 nl Lungs: Bilateral reduced breath sound, no rales Abdomen:soft, Non-tender, non-distended Extremities: trace to 1+ edema now Dialysis Access: No access available   [name] 07/18/2023,8:42 AM  LOS: 6 days

## 2023-07-18 NOTE — Discharge Summary (Signed)
 Physician Discharge Summary  Karla Pavone FMW:[id number] DOB: [birth year] DOA: 07/11/2023  PCP: Almarie Waddell [name], NP  Admit date: 07/11/2023 Discharge date: 07/18/2023  Admitted From: Home Disposition:  Home  Recommendations for Outpatient Follow-up:  Follow up with PCP Follow-up with [facility] in 1 to 2 weeks Hemoglobin A1c was ordered and pending at time of discharge. Please follow up outpatient.   Discharge Condition: Stable.  At risk for rehospitalization and progressing to ESRD and needing dialysis in near future. CODE STATUS: Full code Diet recommendation: Renal  Brief/Interim Summary: Jill Jefferson is a 63/F with history of CKD 5, seizure disorder, chronic diastolic CHF, cataract presented to the ED with shortness of breath and hypoglycemia, has not followed up with nephrology in the last 1 to 2 years.  Compliant with home dose of Lasix , in the ER hypertensive, hypoxic placed on BiPAP initially and then weaned down to 6 L O2, creatinine 11.5, BUN 80, hemoglobin 8.4, proBNP 33608, troponin 108, 99. Chest x-ray with increased moderate left and small right effusions, CT abdomen pelvis noted bilateral pleural effusions, generalized subcutaneous edema, ascites, admitted, started on diuretics.  Nephrology has been following due to her AKI and CKD stage V.  Patient has been very reluctant to commit to hemodialysis.  Discharge Diagnoses:   Principal Problem:   Acute renal failure superimposed on stage 5 chronic kidney disease, not on chronic dialysis (HCC) Active Problems:   Uncontrolled type 2 diabetes mellitus with hyperglycemia, with long-term current use of insulin  (HCC)   Age-related cataract of both eyes   Seizure disorder (HCC)   Hypertension   Acute respiratory failure with hypoxia (HCC)   Acute on chronic diastolic CHF (congestive heart failure) (HCC)    Acute hypoxemic respiratory failure - Now off BiPAP. Now on room air    AKI on CKD stage V - Nephrology  following. Patient hesitant to commit to HD at this time  - Torsemide , metolazone     Acute on chronic systolic and diastolic CHF - Torsemide , coreg    Chest pain - EKG reviewed independently, has T wave inv in lateral leads, Troponin 25, 24. Resolved with tylenol . No further chest pain.    Bilateral upper extremity swelling - Doppler negative    Seizure disorder  - Keppra     DM type 2 - SSI - Check A1c  - pending. Follow up outpatient.     Discharge Instructions  Discharge Instructions     Call MD for:  difficulty breathing, headache or visual disturbances   Complete by: As directed    Call MD for:  extreme fatigue   Complete by: As directed    Call MD for:  persistant dizziness or light-headedness   Complete by: As directed    Call MD for:  persistant nausea and vomiting   Complete by: As directed    Call MD for:  severe uncontrolled pain   Complete by: As directed    Call MD for:  temperature >100.4   Complete by: As directed    Discharge instructions   Complete by: As directed    You were cared for by a hospitalist during your hospital stay. If you have any questions about your discharge medications or the care you received while you were in the hospital after you are discharged, you can call the unit and ask to speak with the hospitalist on call if the hospitalist that took care of you is not available. Once you are discharged, your primary care physician will handle any  further medical issues. Please note that NO REFILLS for any discharge medications will be authorized once you are discharged, as it is imperative that you return to your primary care physician (or establish a relationship with a primary care physician if you do not have one) for your aftercare needs so that they can reassess your need for medications and monitor your lab values.   Increase activity slowly   Complete by: As directed       Allergies as of 07/18/2023   No Known Allergies       Medication List     STOP taking these medications    amLODipine  5 MG tablet Commonly known as: NORVASC    furosemide  20 MG tablet Commonly known as: LASIX    hydrALAZINE  10 MG tablet Commonly known as: APRESOLINE        TAKE these medications    aspirin  EC 81 MG tablet Take 81 mg by mouth daily. Swallow whole.   BD Pen Needle Nano U/F 32G X 4 MM Misc Generic drug: Insulin  Pen Needle Use for insulin  injections once daily. (for insulin  injections qdDx: E11.65 on insulin )   carvedilol  6.25 MG tablet Commonly known as: COREG  Take 1 tablet (6.25 mg total) by mouth 2 (two) times daily with a meal.   levETIRAcetam  500 MG tablet Commonly known as: KEPPRA  Take 1 tablet (500 mg total) by mouth 2 (two) times daily.   metolazone  5 MG tablet Commonly known as: ZAROXOLYN  Take 1 tablet (5 mg total) by mouth daily.   torsemide  100 MG tablet Commonly known as: DEMADEX  Take 1 tablet (100 mg total) by mouth 2 (two) times daily.   Tresiba  FlexTouch 100 UNIT/ML FlexTouch Pen Generic drug: insulin  degludec inject 10 units every evening What changed:  how much to take when to take this additional instructions        Follow-up Information     Almarie Waddell [name], NP Follow up.   Specialties: Family Medicine, Emergency Medicine Contact information: [address] [place] [address] [phone]         [place], [place]. Schedule an appointment as soon as possible for a visit in 1 week(s).   Contact information: [address] [place] [address] [phone]                No Known Allergies   Procedures/Studies: VAS US  UPPER EXTREMITY VENOUS DUPLEX Result Date: 07/17/2023 UPPER VENOUS STUDY  Patient Name:  Jill Jefferson  Date of Exam:   07/17/2023 Medical Rec #: [id number]        Accession #:    [id number] Date of Birth: December 31, 1960         Patient Gender: F Patient Age:   63 years Exam Location:  [facility] Procedure:       VAS US  UPPER EXTREMITY VENOUS DUPLEX Referring Phys: SIGURD PAC --------------------------------------------------------------------------------  Indications: Swelling, and left worse than right Other Indications: CHF with SOB. Comparison Study: No priors. Performing Technologist: Ricka Sturdivant-Jones RDMS, RVT  Examination Guidelines: A complete evaluation includes B-mode imaging, spectral Doppler, color Doppler, and power Doppler as needed of all accessible portions of each vessel. Bilateral testing is considered an integral part of a complete examination. Limited examinations for reoccurring indications may be performed as noted.  Right Findings: +----------+------------+---------+-----------+----------+-------+ RIGHT     CompressiblePhasicitySpontaneousPropertiesSummary +----------+------------+---------+-----------+----------+-------+ IJV           Full       Yes       Yes                      +----------+------------+---------+-----------+----------+-------+  Subclavian    Full       Yes       Yes                      +----------+------------+---------+-----------+----------+-------+ Axillary      Full       Yes       Yes                      +----------+------------+---------+-----------+----------+-------+ Brachial      Full                                          +----------+------------+---------+-----------+----------+-------+ Radial        Full                                          +----------+------------+---------+-----------+----------+-------+ Ulnar         Full                                          +----------+------------+---------+-----------+----------+-------+ Cephalic      Full                                          +----------+------------+---------+-----------+----------+-------+ Basilic       Full                                          +----------+------------+---------+-----------+----------+-------+  Left  Findings: +----------+------------+---------+-----------+----------+-------+ LEFT      CompressiblePhasicitySpontaneousPropertiesSummary +----------+------------+---------+-----------+----------+-------+ IJV           Full       Yes       Yes                      +----------+------------+---------+-----------+----------+-------+ Subclavian               Yes       Yes                      +----------+------------+---------+-----------+----------+-------+ Axillary      Full       Yes       Yes                      +----------+------------+---------+-----------+----------+-------+ Brachial      Full                                          +----------+------------+---------+-----------+----------+-------+ Radial        Full                                          +----------+------------+---------+-----------+----------+-------+ Cephalic      Full                                          +----------+------------+---------+-----------+----------+-------+  Basilic       Full                                          +----------+------------+---------+-----------+----------+-------+  Summary: No evidence of deep vein or superficial vein thrombosis involving the right and left upper extremities.  *See table(s) above for measurements and observations.  Diagnosing physician: Debby Robertson Electronically signed by Debby Robertson on 07/17/2023 at 5:05:01 PM.    Final    ECHOCARDIOGRAM COMPLETE Result Date: 07/13/2023    ECHOCARDIOGRAM REPORT   Patient Name:   DENEISE GETTY Date of Exam: 07/13/2023 Medical Rec #:  [id number]       Height:       60.0 in Accession #:    [id number]      Weight:       153.7 lb Date of Birth:  [birth year]        BSA:          1.669 m Patient Age:    63 years        BP:           167/89 mmHg Patient Gender: F               HR:           88 bpm. Exam Location:  Inpatient Procedure: 2D Echo, 3D Echo, Cardiac Doppler, Color Doppler and Strain Analysis             (Both Spectral and Color Flow Doppler were utilized during            procedure). Indications:    CHF  History:        Patient has prior history of Echocardiogram examinations, most                 recent 09/02/2020. Risk Factors:Hypertension and Diabetes.  Sonographer:    Therisa Crouch Referring Phys: [id number] MARSA [name] MELVIN IMPRESSIONS  1. Left ventricular ejection fraction, by estimation, is 40 to 45%. The left ventricle has mildly decreased function. The left ventricle demonstrates regional wall motion abnormalities (see scoring diagram/findings for description). Apical akinesis. There is mild left ventricular hypertrophy. Left ventricular diastolic parameters are indeterminate.  2. Right ventricular systolic function is normal. The right ventricular size is normal. There is moderately elevated pulmonary artery systolic pressure. The estimated right ventricular systolic pressure is 45.2 mmHg.  3. Left atrial size was mildly dilated.  4. Moderate pleural effusion in the left lateral region.  5. The mitral valve is normal in structure. Trivial mitral valve regurgitation. No evidence of mitral stenosis.  6. The aortic valve is tricuspid. Aortic valve regurgitation is mild. No aortic stenosis is present.  7. The inferior vena cava is normal in size with greater than 50% respiratory variability, suggesting right atrial pressure of 3 mmHg. FINDINGS  Left Ventricle: Left ventricular ejection fraction, by estimation, is 40 to 45%. The left ventricle has mildly decreased function. The left ventricle demonstrates regional wall motion abnormalities. The left ventricular internal cavity size was normal in size. There is mild left ventricular hypertrophy. Left ventricular diastolic parameters are indeterminate.  LV Wall Scoring: The entire apex is akinetic. The anterior wall, antero-lateral wall, anterior septum, inferior wall, posterior wall, mid inferoseptal segment, and basal inferoseptal segment are normal.  Right Ventricle: The right ventricular size is normal. No increase in right ventricular wall thickness. Right ventricular systolic  function is normal. There is moderately elevated pulmonary artery systolic pressure. The tricuspid regurgitant velocity is 3.25 m/s, and with an assumed right atrial pressure of 3 mmHg, the estimated right ventricular systolic pressure is 45.2 mmHg. Left Atrium: Left atrial size was mildly dilated. Right Atrium: Right atrial size was normal in size. Pericardium: Trivial pericardial effusion is present. Mitral Valve: The mitral valve is normal in structure. Trivial mitral valve regurgitation. No evidence of mitral valve stenosis. Tricuspid Valve: The tricuspid valve is normal in structure. Tricuspid valve regurgitation is mild. Aortic Valve: The aortic valve is tricuspid. Aortic valve regurgitation is mild. No aortic stenosis is present. Pulmonic Valve: The pulmonic valve was not well visualized. Pulmonic valve regurgitation is trivial. Aorta: The aortic root and ascending aorta are structurally normal, with no evidence of dilitation. Venous: The inferior vena cava is normal in size with greater than 50% respiratory variability, suggesting right atrial pressure of 3 mmHg. IAS/Shunts: The interatrial septum was not well visualized. Additional Comments: There is a moderate pleural effusion in the left lateral region.  LEFT VENTRICLE PLAX 2D LVIDd:         4.60 cm      Diastology LVIDs:         3.00 cm      LV e' medial:    6.31 cm/s LV PW:         1.00 cm      LV E/e' medial:  12.2 LV IVS:        1.20 cm      LV e' lateral:   9.90 cm/s LVOT diam:     2.10 cm      LV E/e' lateral: 7.8 LVOT Area:     3.46 cm                              3D Volume EF LV Volumes (MOD)            LV 3D EDV:   79.43 ml LV vol d, MOD A2C: 101.0 ml LV 3D ESV:   42.68 ml LV vol d, MOD A4C: 66.0 ml LV vol s, MOD A2C: 44.4 ml LV vol s, MOD A4C: 35.9 ml LV SV MOD A2C:     56.6 ml LV SV MOD A4C:     66.0 ml LV SV MOD  BP:      44.8 ml RIGHT VENTRICLE            IVC RV Basal diam:  4.10 cm    IVC diam: 1.70 cm RV S prime:     8.49 cm/s TAPSE (M-mode): 2.1 cm LEFT ATRIUM             Index LA diam:        3.90 cm 2.34 cm/m LA Vol (A2C):   66.4 ml 39.79 ml/m LA Vol (A4C):   56.4 ml 33.80 ml/m LA Biplane Vol: 65.3 ml 39.13 ml/m   AORTA Ao Root diam: 2.60 cm Ao Asc diam:  2.80 cm MITRAL VALVE               TRICUSPID VALVE MV Area (PHT): 5.97 cm    TR Peak grad:   42.2 mmHg MV Decel Time: 127 msec    TR Vmax:        325.00 cm/s MV E velocity: 77.00 cm/s MV A velocity: 83.30 cm/s  SHUNTS MV E/A ratio:  0.92  Systemic Diam: 2.10 cm Lonni Nanas MD Electronically signed by Lonni Nanas MD Signature Date/Time: 07/13/2023/2:51:51 PM    Final    CT ABDOMEN PELVIS WO CONTRAST Result Date: 07/11/2023 CLINICAL DATA:  Acute kidney failure. EXAM: CT ABDOMEN AND PELVIS WITHOUT CONTRAST TECHNIQUE: Multidetector CT imaging of the abdomen and pelvis was performed following the standard protocol without IV contrast. RADIATION DOSE REDUCTION: This exam was performed according to the departmental dose-optimization program which includes automated exposure control, adjustment of the mA and/or kV according to patient size and/or use of iterative reconstruction technique. COMPARISON:  Renal ultrasound 04/24/2023 FINDINGS: Lower chest: Moderate bilateral pleural effusions with associated atelectasis. The heart is upper normal in size. Decreased density of the blood pool suggests anemia. Hepatobiliary: Elongated right lobe of the liver. No evidence of focal liver lesion on this unenhanced exam. Gallbladder physiologically distended, no calcified stone. No biliary dilatation. Pancreas: No ductal dilatation or inflammation. Spleen: Normal in size without focal abnormality. Adrenals/Urinary Tract: No adrenal nodule. Right renal atrophy. No hydronephrosis. Small bilateral renal cysts. No further follow-up imaging is recommended. No  renal calculi. Partially distended urinary bladder, no wall thickening for degree of distension. Stomach/Bowel: No bowel obstruction or inflammatory change. Moderate colonic stool burden. Normal appendix. Vascular/Lymphatic: Aortic and branch atherosclerosis. No aortic aneurysm. No bulky abdominopelvic adenopathy. Reproductive: Uterus and bilateral adnexa are unremarkable. Other: There is marked generalized subcutaneous edema, confluent in the flanks. Mild edema of the intra-abdominal fat. Trace ascites in the pelvis. No free air. Minimal fat containing umbilical hernia. Musculoskeletal: There are no acute or suspicious osseous abnormalities. IMPRESSION: 1. Moderate bilateral pleural effusions with associated atelectasis. 2. Findings suggestive of third spacing with marked generalized subcutaneous edema, confluent in the flanks. Mild edema of the intra-abdominal fat. Trace ascites in the pelvis. 3. Right renal atrophy. No hydronephrosis. Aortic Atherosclerosis (ICD10-I70.0). Electronically Signed   By: Andrea Gasman M.D.   On: 07/11/2023 19:55   DG Chest Port 1 View Result Date: 07/11/2023 CLINICAL DATA:  2nd leg now spread to whole body.  On daily 6. EXAM: PORTABLE CHEST 1 VIEW COMPARISON:  Chest radiographs 06/12/2023 and 09/04/2020 FINDINGS: Interval increase in now moderate left and mild right basilar homogeneous opacities likely representing pleural effusions with associated likely atelectasis. No pneumothorax is seen. The visualized cardiac silhouette is unchanged and again may be mildly enlarged. No acute skeletal abnormality. IMPRESSION: Interval increase in now moderate left and mild right basilar homogeneous opacities likely representing pleural effusions with associated likely atelectasis. Electronically Signed   By: Tanda Lyons M.D.   On: 07/11/2023 16:30       Discharge Exam: Vitals:   07/18/23 0339 07/18/23 0728  BP: 134/66 139/76  Pulse:    Resp: 18 16  Temp: 98.9 F (37.2 C) 98.3  F (36.8 C)  SpO2:      General: Pt is alert, awake, not in acute distress Cardiovascular: RRR, S1/S2 +, + edema Respiratory: CTA bilaterally, no wheezing, no rhonchi, no respiratory distress, no conversational dyspnea  Abdominal: Soft, NT, ND, bowel sounds + Extremities: + edema, no cyanosis Psych: Normal mood and affect, stable judgement and insight     The results of significant diagnostics from this hospitalization (including imaging, microbiology, ancillary and laboratory) are listed below for reference.     Microbiology: Recent Results (from the past 240 hours)  Resp panel by RT-PCR (RSV, Flu A&B, Covid) Anterior Nasal Swab     Status: None   Collection Time: 07/11/23  4:25 PM  Specimen: Anterior Nasal Swab  Result Value Ref Range Status   SARS Coronavirus 2 by RT PCR NEGATIVE NEGATIVE Final    Comment: (NOTE) SARS-CoV-2 target nucleic acids are NOT DETECTED.  The SARS-CoV-2 RNA is generally detectable in upper respiratory specimens during the acute phase of infection. The lowest concentration of SARS-CoV-2 viral copies this assay can detect is 138 copies/mL. A negative result does not preclude SARS-Cov-2 infection and should not be used as the sole basis for treatment or other patient management decisions. A negative result may occur with  improper specimen collection/handling, submission of specimen other than nasopharyngeal swab, presence of viral mutation(s) within the areas targeted by this assay, and inadequate number of viral copies(<138 copies/mL). A negative result must be combined with clinical observations, patient history, and epidemiological information. The expected result is Negative.  Fact Sheet for Patients:  BloggerCourse.com  Fact Sheet for Healthcare Providers:  SeriousBroker.it  This test is no t yet approved or cleared by the [place]  FDA and  has been authorized for detection and/or  diagnosis of SARS-CoV-2 by FDA under an Emergency Use Authorization (EUA). This EUA will remain  in effect (meaning this test can be used) for the duration of the COVID-19 declaration under Section 564(b)(1) of the Act, 21 U.S.C.section 360bbb-3(b)(1), unless the authorization is terminated  or revoked sooner.       Influenza A by PCR NEGATIVE NEGATIVE Final   Influenza B by PCR NEGATIVE NEGATIVE Final    Comment: (NOTE) The Xpert Xpress SARS-CoV-2/FLU/RSV plus assay is intended as an aid in the diagnosis of influenza from Nasopharyngeal swab specimens and should not be used as a sole basis for treatment. Nasal washings and aspirates are unacceptable for Xpert Xpress SARS-CoV-2/FLU/RSV testing.  Fact Sheet for Patients: BloggerCourse.com  Fact Sheet for Healthcare Providers: SeriousBroker.it  This test is not yet approved or cleared by the [place]  FDA and has been authorized for detection and/or diagnosis of SARS-CoV-2 by FDA under an Emergency Use Authorization (EUA). This EUA will remain in effect (meaning this test can be used) for the duration of the COVID-19 declaration under Section 564(b)(1) of the Act, 21 U.S.C. section 360bbb-3(b)(1), unless the authorization is terminated or revoked.     Resp Syncytial Virus by PCR NEGATIVE NEGATIVE Final    Comment: (NOTE) Fact Sheet for Patients: BloggerCourse.com  Fact Sheet for Healthcare Providers: SeriousBroker.it  This test is not yet approved or cleared by the [place]  FDA and has been authorized for detection and/or diagnosis of SARS-CoV-2 by FDA under an Emergency Use Authorization (EUA). This EUA will remain in effect (meaning this test can be used) for the duration of the COVID-19 declaration under Section 564(b)(1) of the Act, 21 U.S.C. section 360bbb-3(b)(1), unless the authorization is terminated  or revoked.  Performed at [facility], [address], [place], [place] [address]      Labs: BNP (last 3 results) Recent Labs    04/02/23 1032  BNP 290*   Basic Metabolic Panel: Recent Labs  Lab 07/13/23 0257 07/14/23 0826 07/15/23 0412 07/16/23 0540 07/17/23 0416 07/18/23 0434  NA 137 135 138 133* 133* 132*  K 3.9 3.8 4.0 3.9 3.7 3.7  CL 106 101 102 98 97* 96*  CO2 22 23 24 23 23 23   GLUCOSE 143* 84 110* 180* 162* 173*  BUN 84* 86* 91* 97* 100* 103*  CREATININE 11.20* 10.90* 11.10* 10.94* 11.27* 11.41*  CALCIUM  7.9* 7.7* 7.7* 7.3* 7.7* 7.8*  PHOS 6.2* 6.4*  6.6* 6.8* 6.5*  --    Liver Function Tests: Recent Labs  Lab 07/11/23 1625 07/13/23 0257 07/14/23 0826 07/15/23 0412 07/16/23 0540 07/17/23 0416  AST 24  --   --   --   --   --   ALT 32  --   --   --   --   --   ALKPHOS 79  --   --   --   --   --   BILITOT 0.2  --   --   --   --   --   PROT 6.2*  --   --   --   --   --   ALBUMIN 3.3* 2.1* 2.2* 2.1* 2.1* 2.2*   No results for input(s): LIPASE, AMYLASE in the last 168 hours. No results for input(s): AMMONIA in the last 168 hours. CBC: Recent Labs  Lab 07/11/23 1625 07/11/23 1632 07/13/23 0257 07/15/23 0412 07/16/23 0540 07/17/23 0416  WBC 6.8  --  6.8 6.8 7.3 7.0  NEUTROABS 5.6  --   --   --   --   --   HGB 8.4* 8.8* 8.2* 7.9* 8.0* 7.7*  HCT 27.5* 26.0* 26.3* 25.9* 25.9* 24.0*  MCV 79.3*  --  79.9* 79.9* 78.0* 78.7*  PLT 293  --  289 278 248 248   Cardiac Enzymes: No results for input(s): CKTOTAL, CKMB, CKMBINDEX, TROPONINI in the last 168 hours. BNP: Invalid input(s): POCBNP CBG: Recent Labs  Lab 07/17/23 0821 07/17/23 1227 07/17/23 1644 07/17/23 2131 07/18/23 0725  GLUCAP 151* 194* 277* 185* 152*   D-Dimer No results for input(s): DDIMER in the last 72 hours. Hgb A1c No results for input(s): HGBA1C in the last 72 hours. Lipid Profile No results for input(s): CHOL, HDL, LDLCALC,  TRIG, CHOLHDL, LDLDIRECT in the last 72 hours. Thyroid  function studies No results for input(s): TSH, T4TOTAL, T3FREE, THYROIDAB in the last 72 hours.  Invalid input(s): FREET3 Anemia work up No results for input(s): VITAMINB12, FOLATE, FERRITIN, TIBC, IRON , RETICCTPCT in the last 72 hours. Urinalysis    Component Value Date/Time   COLORURINE STRAW (A) 01/01/2023 1601   APPEARANCEUR CLEAR 01/01/2023 1601   LABSPEC 1.010 01/01/2023 1601   PHURINE 7.0 01/01/2023 1601   GLUCOSEU 50 (A) 01/01/2023 1601   HGBUR SMALL (A) 01/01/2023 1601   BILIRUBINUR NEGATIVE 01/01/2023 1601   BILIRUBINUR neg 06/23/2019 1148   KETONESUR NEGATIVE 01/01/2023 1601   PROTEINUR >=300 (A) 01/01/2023 1601   UROBILINOGEN 0.2 06/23/2019 1148   NITRITE NEGATIVE 01/01/2023 1601   LEUKOCYTESUR NEGATIVE 01/01/2023 1601   Sepsis Labs Recent Labs  Lab 07/13/23 0257 07/15/23 0412 07/16/23 0540 07/17/23 0416  WBC 6.8 6.8 7.3 7.0   Microbiology Recent Results (from the past 240 hours)  Resp panel by RT-PCR (RSV, Flu A&B, Covid) Anterior Nasal Swab     Status: None   Collection Time: 07/11/23  4:25 PM   Specimen: Anterior Nasal Swab  Result Value Ref Range Status   SARS Coronavirus 2 by RT PCR NEGATIVE NEGATIVE Final    Comment: (NOTE) SARS-CoV-2 target nucleic acids are NOT DETECTED.  The SARS-CoV-2 RNA is generally detectable in upper respiratory specimens during the acute phase of infection. The lowest concentration of SARS-CoV-2 viral copies this assay can detect is 138 copies/mL. A negative result does not preclude SARS-Cov-2 infection and should not be used as the sole basis for treatment or other patient management decisions. A negative result may occur with  improper specimen collection/handling,  submission of specimen other than nasopharyngeal swab, presence of viral mutation(s) within the areas targeted by this assay, and inadequate number of viral copies(<138  copies/mL). A negative result must be combined with clinical observations, patient history, and epidemiological information. The expected result is Negative.  Fact Sheet for Patients:  BloggerCourse.com  Fact Sheet for Healthcare Providers:  SeriousBroker.it  This test is no t yet approved or cleared by the [place]  FDA and  has been authorized for detection and/or diagnosis of SARS-CoV-2 by FDA under an Emergency Use Authorization (EUA). This EUA will remain  in effect (meaning this test can be used) for the duration of the COVID-19 declaration under Section 564(b)(1) of the Act, 21 U.S.C.section 360bbb-3(b)(1), unless the authorization is terminated  or revoked sooner.       Influenza A by PCR NEGATIVE NEGATIVE Final   Influenza B by PCR NEGATIVE NEGATIVE Final    Comment: (NOTE) The Xpert Xpress SARS-CoV-2/FLU/RSV plus assay is intended as an aid in the diagnosis of influenza from Nasopharyngeal swab specimens and should not be used as a sole basis for treatment. Nasal washings and aspirates are unacceptable for Xpert Xpress SARS-CoV-2/FLU/RSV testing.  Fact Sheet for Patients: BloggerCourse.com  Fact Sheet for Healthcare Providers: SeriousBroker.it  This test is not yet approved or cleared by the [place]  FDA and has been authorized for detection and/or diagnosis of SARS-CoV-2 by FDA under an Emergency Use Authorization (EUA). This EUA will remain in effect (meaning this test can be used) for the duration of the COVID-19 declaration under Section 564(b)(1) of the Act, 21 U.S.C. section 360bbb-3(b)(1), unless the authorization is terminated or revoked.     Resp Syncytial Virus by PCR NEGATIVE NEGATIVE Final    Comment: (NOTE) Fact Sheet for Patients: BloggerCourse.com  Fact Sheet for Healthcare  Providers: SeriousBroker.it  This test is not yet approved or cleared by the [place]  FDA and has been authorized for detection and/or diagnosis of SARS-CoV-2 by FDA under an Emergency Use Authorization (EUA). This EUA will remain in effect (meaning this test can be used) for the duration of the COVID-19 declaration under Section 564(b)(1) of the Act, 21 U.S.C. section 360bbb-3(b)(1), unless the authorization is terminated or revoked.  Performed at [facility], [address], [place], [place] [address]      Patient was seen and examined on the day of discharge and was found to be in stable condition. Time coordinating discharge: 35 minutes including assessment and coordination of care, as well as examination of the patient.   SIGNED:  Delon Hoe, DO [facility] 07/18/2023, 10:26 AM

## 2023-07-18 NOTE — Plan of Care (Signed)
  Problem: Education: Goal: Knowledge of General Education information will improve Description: Including pain rating scale, medication(s)/side effects and non-pharmacologic comfort measures Outcome: Adequate for Discharge   Problem: Health Behavior/Discharge Planning: Goal: Ability to manage health-related needs will improve Outcome: Adequate for Discharge   Problem: Clinical Measurements: Goal: Ability to maintain clinical measurements within normal limits will improve Outcome: Adequate for Discharge Goal: Will remain free from infection Outcome: Adequate for Discharge Goal: Diagnostic test results will improve Outcome: Adequate for Discharge Goal: Respiratory complications will improve Outcome: Adequate for Discharge Goal: Cardiovascular complication will be avoided Outcome: Adequate for Discharge   Problem: Activity: Goal: Risk for activity intolerance will decrease Outcome: Adequate for Discharge   Problem: Nutrition: Goal: Adequate nutrition will be maintained Outcome: Adequate for Discharge   Problem: Coping: Goal: Level of anxiety will decrease Outcome: Adequate for Discharge   Problem: Elimination: Goal: Will not experience complications related to bowel motility Outcome: Adequate for Discharge Goal: Will not experience complications related to urinary retention Outcome: Adequate for Discharge   Problem: Pain Managment: Goal: General experience of comfort will improve and/or be controlled Outcome: Adequate for Discharge   Problem: Safety: Goal: Ability to remain free from injury will improve Outcome: Adequate for Discharge   Problem: Skin Integrity: Goal: Risk for impaired skin integrity will decrease Outcome: Adequate for Discharge   Problem: Education: Goal: Ability to demonstrate management of disease process will improve Outcome: Adequate for Discharge Goal: Ability to verbalize understanding of medication therapies will improve Outcome: Adequate  for Discharge Goal: Individualized Educational Video(s) Outcome: Adequate for Discharge   Problem: Activity: Goal: Capacity to carry out activities will improve Outcome: Adequate for Discharge   Problem: Cardiac: Goal: Ability to achieve and maintain adequate cardiopulmonary perfusion will improve Outcome: Adequate for Discharge   Problem: Education: Goal: Ability to describe self-care measures that may prevent or decrease complications (Diabetes Survival Skills Education) will improve Outcome: Adequate for Discharge Goal: Individualized Educational Video(s) Outcome: Adequate for Discharge   Problem: Coping: Goal: Ability to adjust to condition or change in health will improve Outcome: Adequate for Discharge   Problem: Fluid Volume: Goal: Ability to maintain a balanced intake and output will improve Outcome: Adequate for Discharge   Problem: Health Behavior/Discharge Planning: Goal: Ability to identify and utilize available resources and services will improve Outcome: Adequate for Discharge Goal: Ability to manage health-related needs will improve Outcome: Adequate for Discharge   Problem: Metabolic: Goal: Ability to maintain appropriate glucose levels will improve Outcome: Adequate for Discharge   Problem: Nutritional: Goal: Maintenance of adequate nutrition will improve Outcome: Adequate for Discharge Goal: Progress toward achieving an optimal weight will improve Outcome: Adequate for Discharge   Problem: Skin Integrity: Goal: Risk for impaired skin integrity will decrease Outcome: Adequate for Discharge   Problem: Tissue Perfusion: Goal: Adequacy of tissue perfusion will improve Outcome: Adequate for Discharge

## 2023-07-18 NOTE — Progress Notes (Signed)
 Heart Failure Navigator Progress Note  Assessed for Heart & Vascular TOC clinic readiness.  Patient is discharging home with plans for Renal follow up. Creatinine 11.5.Patient declining hemodialysis at this time. No HF TOC.   Navigator will sign off at this time.   Stephane Haddock, BSN, Scientist, clinical (histocompatibility and immunogenetics) Only

## 2023-07-18 NOTE — TOC Transition Note (Signed)
 Transition of Care [facility]) - Discharge Note   Patient Details  Name: Isel Skufca MRN: [id number] Date of Birth: 20-Oct-1960  [facility] CM/SW Contact:  Sudie Erminio Deems, RN Phone Number: 07/18/2023, 11:28 AM   Clinical Narrative: Patient will transition home today; no further needs identified at this time.      Final next level of care: Home/Self Care Barriers to Discharge: No Barriers Identified   Patient Goals and CMS Choice Patient states their goals for this hospitalization and ongoing recovery are:: patient plans to return home once stable.   Choice offered to / list presented to : NA     Discharge Plan and Services Additional resources added to the After Visit Summary for   In-house Referral: NA Discharge Planning Services: CM Consult Post Acute Care Choice: NA            DME Agency: NA       HH Arranged: NA     Social Drivers of Health (SDOH) Interventions SDOH Screenings   Food Insecurity: No Food Insecurity (07/12/2023)  Housing: Patient Declined (07/12/2023)  Transportation Needs: Patient Declined (07/12/2023)  Utilities: Patient Declined (07/12/2023)  Depression (PHQ2-9): Low Risk  (02/25/2019)  Tobacco Use: Low Risk  (07/11/2023)     Readmission Risk Interventions     No data to display

## 2023-07-19 ENCOUNTER — Ambulatory Visit: Payer: Self-pay | Admitting: Family Medicine

## 2023-07-22 MED FILL — Nitroglycerin IV Soln 200 MCG/ML in D5W: INTRAVENOUS | Qty: 250 | Status: AC

## 2023-07-24 ENCOUNTER — Emergency Department (HOSPITAL_COMMUNITY)

## 2023-07-24 ENCOUNTER — Emergency Department (HOSPITAL_COMMUNITY)
Admission: EM | Admit: 2023-07-24 | Discharge: 2023-07-24 | Disposition: A | Discharge status: Home / Self Care | Attending: Emergency Medicine | Admitting: Emergency Medicine

## 2023-07-24 ENCOUNTER — Encounter (HOSPITAL_COMMUNITY): Payer: Self-pay

## 2023-07-24 ENCOUNTER — Other Ambulatory Visit: Payer: Self-pay

## 2023-07-24 DIAGNOSIS — N185 Chronic kidney disease, stage 5: Secondary | ICD-10-CM | POA: Diagnosis not present

## 2023-07-24 DIAGNOSIS — Z7982 Long term (current) use of aspirin: Secondary | ICD-10-CM | POA: Insufficient documentation

## 2023-07-24 DIAGNOSIS — E162 Hypoglycemia, unspecified: Secondary | ICD-10-CM

## 2023-07-24 DIAGNOSIS — E1122 Type 2 diabetes mellitus with diabetic chronic kidney disease: Secondary | ICD-10-CM | POA: Diagnosis not present

## 2023-07-24 DIAGNOSIS — Z794 Long term (current) use of insulin: Secondary | ICD-10-CM | POA: Diagnosis not present

## 2023-07-24 DIAGNOSIS — I509 Heart failure, unspecified: Secondary | ICD-10-CM | POA: Diagnosis not present

## 2023-07-24 DIAGNOSIS — R600 Localized edema: Secondary | ICD-10-CM | POA: Diagnosis not present

## 2023-07-24 DIAGNOSIS — I132 Hypertensive heart and chronic kidney disease with heart failure and with stage 5 chronic kidney disease, or end stage renal disease: Secondary | ICD-10-CM | POA: Diagnosis not present

## 2023-07-24 DIAGNOSIS — E876 Hypokalemia: Secondary | ICD-10-CM | POA: Diagnosis not present

## 2023-07-24 DIAGNOSIS — E11649 Type 2 diabetes mellitus with hypoglycemia without coma: Secondary | ICD-10-CM | POA: Diagnosis not present

## 2023-07-24 DIAGNOSIS — Z79899 Other long term (current) drug therapy: Secondary | ICD-10-CM | POA: Diagnosis not present

## 2023-07-24 LAB — CBC
HCT: 30.1 % — ABNORMAL LOW (ref 36.0–46.0)
Hemoglobin: 9 g/dL — ABNORMAL LOW (ref 12.0–15.0)
MCH: 24.3 pg — ABNORMAL LOW (ref 26.0–34.0)
MCHC: 29.9 g/dL — ABNORMAL LOW (ref 30.0–36.0)
MCV: 81.4 fL (ref 80.0–100.0)
Platelets: 272 K/uL (ref 150–400)
RBC: 3.7 MIL/uL — ABNORMAL LOW (ref 3.87–5.11)
RDW: 16.1 % — ABNORMAL HIGH (ref 11.5–15.5)
WBC: 6.9 K/uL (ref 4.0–10.5)
nRBC: 0 % (ref 0.0–0.2)

## 2023-07-24 LAB — URINALYSIS, ROUTINE W REFLEX MICROSCOPIC
Bacteria, UA: NONE SEEN
Bilirubin Urine: NEGATIVE
Glucose, UA: 150 mg/dL — AB
Hgb urine dipstick: NEGATIVE
Ketones, ur: NEGATIVE mg/dL
Leukocytes,Ua: NEGATIVE
Nitrite: NEGATIVE
Protein, ur: >=300 mg/dL — AB
Specific Gravity, Urine: 1.009 (ref 1.005–1.030)
pH: 7 (ref 5.0–8.0)

## 2023-07-24 LAB — BASIC METABOLIC PANEL WITH GFR
Anion gap: 19 — ABNORMAL HIGH (ref 5–15)
BUN: 110 mg/dL — ABNORMAL HIGH (ref 8–23)
CO2: 24 mmol/L (ref 22–32)
Calcium: 7.4 mg/dL — ABNORMAL LOW (ref 8.9–10.3)
Chloride: 94 mmol/L — ABNORMAL LOW (ref 98–111)
Creatinine, Ser: 12.32 mg/dL — ABNORMAL HIGH (ref 0.44–1.00)
GFR, Estimated: 3 mL/min — ABNORMAL LOW (ref >60)
Glucose, Bld: 68 mg/dL — ABNORMAL LOW (ref 70–99)
Potassium: 3.4 mmol/L — ABNORMAL LOW (ref 3.5–5.1)
Sodium: 137 mmol/L (ref 135–145)

## 2023-07-24 LAB — CBG MONITORING, ED
Glucose-Capillary: 101 mg/dL — ABNORMAL HIGH (ref 70–99)
Glucose-Capillary: 80 mg/dL (ref 70–99)
Glucose-Capillary: 110 mg/dL — ABNORMAL HIGH (ref 70–99)
Glucose-Capillary: 131 mg/dL — ABNORMAL HIGH (ref 70–99)
Glucose-Capillary: 229 mg/dL — ABNORMAL HIGH (ref 70–99)

## 2023-07-24 MED ORDER — LEVETIRACETAM 500 MG PO TABS
500.0000 mg | ORAL_TABLET | Freq: Once | ORAL | Status: AC
  Administered 2023-07-24: 500 mg via ORAL
  Filled 2023-07-24: qty 1

## 2023-07-24 MED ORDER — SODIUM CHLORIDE 0.9% FLUSH: 3.0000 mL | INTRAVENOUS | Status: DC | PRN

## 2023-07-24 MED ORDER — SODIUM CHLORIDE 0.9% FLUSH
3.0000 mL | Freq: Two times a day (BID) | INTRAVENOUS | Status: DC
  Administered 2023-07-24: 3 mL via INTRAVENOUS

## 2023-07-24 MED ORDER — SODIUM CHLORIDE 0.9 % IV SOLN: 250.0000 mL | INTRAVENOUS | Status: DC | PRN

## 2023-07-24 NOTE — Discharge Instructions (Addendum)
Eat small, frequent meals high in protein. °

## 2023-07-24 NOTE — ED Notes (Signed)
 Pt awake, alert and eating a sandwich, and drinking orange juice

## 2023-07-24 NOTE — ED Provider Notes (Signed)
 [place] EMERGENCY DEPARTMENT AT [facility] Provider Note   CSN: [id number] Arrival date & time: 07/24/23  1003     Patient presents with: Hypoglycemia   Jill Jefferson is a 63 y.o. female.   Pt is a 63 yo female with pmhx significant for htn, dm2, seizures, CKD V (reluctant to start dialysis), CHF, and cataracts.  Pt was admitted from 7/3-7/10 with a CHF exacerbation.  She has not had much of an appetite since d/c.  She took her insulin  (tresiba ) last night and ate a few crackers.  Family found her in the home yelling in the bathroom.  When EMS arrived, she was unresponsive.  Initial CBG was 31.  Pt given 1 mg glucagon and 30g of D10.  BS now up to 101.  Pt is awake and alert and denies any pain or sob.       Prior to Admission medications   Medication Sig Start Date End Date Taking? Authorizing Provider  aspirin  EC 81 MG tablet Take 81 mg by mouth daily. Swallow whole.    [provider]  carvedilol  (COREG ) 6.25 MG tablet Take 1 tablet (6.25 mg total) by mouth 2 (two) times daily with a meal. 07/18/23   Rojelio Nest, DO  insulin  degludec (TRESIBA  FLEXTOUCH) 100 UNIT/ML FlexTouch Pen inject 10 units every evening Patient taking differently: Inject 10-14 Units into the skin every evening. Inject 10-14 units with evening meal depending on blood sugar. 12/11/21     Insulin  Pen Needle (BD PEN NEEDLE NANO U/F) 32G X 4 MM MISC for insulin  injections qdDx: E11.65 on insulin  06/08/21     levETIRAcetam  (KEPPRA ) 500 MG tablet Take 1 tablet (500 mg total) by mouth 2 (two) times daily. 04/02/23   Almarie Waddell [name], NP  metolazone  (ZAROXOLYN ) 5 MG tablet Take 1 tablet (5 mg total) by mouth daily. 07/18/23   Rojelio Nest, DO  torsemide  (DEMADEX ) 100 MG tablet Take 1 tablet (100 mg total) by mouth 2 (two) times daily. 07/18/23 10/16/23  Rojelio Nest, DO    Allergies: Patient has no known allergies.    Review of Systems  All other systems reviewed and are negative.   Updated  Vital Signs BP (!) 156/74   Pulse 73   Temp (!) 97.4 F (36.3 C) (Oral)   Resp 15   Ht 5' (1.524 m)   Wt 67 kg   SpO2 100%   BMI 28.85 kg/m   Physical Exam Vitals and nursing note reviewed.  Constitutional:      Appearance: Normal appearance.  HENT:     Head: Normocephalic and atraumatic.     Right Ear: External ear normal.     Left Ear: External ear normal.     Nose: Nose normal.     Mouth/Throat:     Mouth: Mucous membranes are moist.     Pharynx: Oropharynx is clear.  Eyes:     Extraocular Movements: Extraocular movements intact.     Conjunctiva/sclera: Conjunctivae normal.     Pupils: Pupils are equal, round, and reactive to light.  Cardiovascular:     Rate and Rhythm: Normal rate and regular rhythm.     Pulses: Normal pulses.     Heart sounds: Normal heart sounds.  Pulmonary:     Effort: Pulmonary effort is normal.     Breath sounds: Normal breath sounds.  Abdominal:     General: Abdomen is flat. Bowel sounds are normal.     Palpations: Abdomen is soft.  Musculoskeletal:  Cervical back: Normal range of motion and neck supple.     Right lower leg: Edema present.     Left lower leg: Edema present.  Skin:    Capillary Refill: Capillary refill takes less than 2 seconds.  Neurological:     General: No focal deficit present.     Mental Status: She is alert and oriented to person, place, and time.  Psychiatric:        Mood and Affect: Mood normal.        Behavior: Behavior normal.     (all labs ordered are listed, but only abnormal results are displayed) Labs Reviewed  BASIC METABOLIC PANEL WITH GFR - Abnormal; Notable for the following components:      Result Value   Potassium 3.4 (*)    Chloride 94 (*)    Glucose, Bld 68 (*)    BUN 110 (*)    Creatinine, Ser 12.32 (*)    Calcium  7.4 (*)    GFR, Estimated 3 (*)    Anion gap 19 (*)    All other components within normal limits  CBC - Abnormal; Notable for the following components:   RBC 3.70 (*)     Hemoglobin 9.0 (*)    HCT 30.1 (*)    MCH 24.3 (*)    MCHC 29.9 (*)    RDW 16.1 (*)    All other components within normal limits  URINALYSIS, ROUTINE W REFLEX MICROSCOPIC - Abnormal; Notable for the following components:   Color, Urine STRAW (*)    Glucose, UA 150 (*)    Protein, ur >=300 (*)    All other components within normal limits  CBG MONITORING, ED - Abnormal; Notable for the following components:   Glucose-Capillary 101 (*)    All other components within normal limits  CBG MONITORING, ED - Abnormal; Notable for the following components:   Glucose-Capillary 110 (*)    All other components within normal limits  CBG MONITORING, ED - Abnormal; Notable for the following components:   Glucose-Capillary 131 (*)    All other components within normal limits  CBG MONITORING, ED - Abnormal; Notable for the following components:   Glucose-Capillary 229 (*)    All other components within normal limits  CBG MONITORING, ED    EKG: EKG Interpretation Date/Time:  Wednesday July 24 2023 10:40:02 EDT Ventricular Rate:  67 PR Interval:  172 QRS Duration:  107 QT Interval:  489 QTC Calculation: 517 R Axis:   64  Text Interpretation: Sinus rhythm Atrial premature complex Nonspecific T abnormalities, diffuse leads Prolonged QT interval No significant change since last tracing Confirmed by Dean Clarity [phone]) on 07/24/2023 10:44:56 AM  Radiology: [facility] Chest Port 1 View Result Date: 07/24/2023 CLINICAL DATA:  sob EXAM: PORTABLE CHEST - 1 VIEW COMPARISON:  July 11, 2023 FINDINGS: Decreasing, but persistent bilateral perihilar interstitial opacities. Decreased size of the bilateral pleural effusions, small on the left and trace on the right. Mild cardiomegaly. No acute fracture or destructive lesions. Multilevel thoracic osteophytosis. IMPRESSION: Persistent, but improved interstitial edema with smaller bilateral pleural effusions. Electronically Signed   By: Rogelia Myers M.D.   On:  07/24/2023 10:26     Procedures   Medications Ordered in the ED  sodium chloride  flush (NS) 0.9 % injection 3 mL (3 mLs Intravenous Given 07/24/23 1017)  sodium chloride  flush (NS) 0.9 % injection 3 mL (has no administration in time range)  0.9 %  sodium chloride  infusion (has no administration in time  range)  levETIRAcetam  (KEPPRA ) tablet 500 mg (500 mg Oral Given 07/24/23 1038)                                    Medical Decision Making Amount and/or Complexity of Data Reviewed Labs: ordered. Radiology: ordered.  Risk Prescription drug management.   This patient presents to the ED for concern of hypoglycemia, this involves an extensive number of treatment options, and is a complaint that carries with it a high risk of complications and morbidity.  The differential diagnosis includes poor oral intake, infection, kidney failure, electrolyte abn   Co morbidities that complicate the patient evaluation  htn, dm2, seizures, CKD V (reluctant to start dialysis), CHF, and cataracts   Additional history obtained:  Additional history obtained from epic chart review External records from outside source obtained and reviewed including EMS report   Lab Tests:  I Ordered, and personally interpreted labs.  The pertinent results include:  cbc with hgb low at 9.0 (improved from 7.7 on 7/9); bmp with bun 110 and cr 12.32 (worsened from cr 11.41 on 7/9)   Imaging Studies ordered:  I ordered imaging studies including cxr  I independently visualized and interpreted imaging which showed  Persistent, but improved interstitial edema with smaller bilateral  pleural effusions.   I agree with the radiologist interpretation   Cardiac Monitoring:  The patient was maintained on a cardiac monitor.  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  She has not made an appointment with [facility].  Daughter is here now and she will make an appt. Hypoglycemia:  improved with food.  Pt encouraged to eat more protein.  Bs has improved and has stayed stable.   Reevaluation:  After the interventions noted above, I reevaluated the patient and found that they have :improved   Social Determinants of Health:  Lives at home   Dispostion:  After consideration of the diagnostic results and the patients response to treatment, I feel that the patent would benefit from discharge with outpatient f/u.       Final diagnoses:  Hypoglycemia  Stage 5 chronic kidney disease not on chronic dialysis [facility])    ED Discharge Orders     None          Dean Clarity, MD 07/24/23 [phone]

## 2023-07-24 NOTE — ED Triage Notes (Signed)
 Pt coming in from home.family found pt yelling in the bathroom called ems. Pt had a cbg of 31 reported by fire. Pt given 1mg  glucagon, 30g of D10. Cbg now is 88. Initial Gcs was 4 increased to 14.   EMS Vitals  Pulse Nsr at 86  Spo2 98% ra  Bp 154/88  22 r hand

## 2023-07-30 ENCOUNTER — Ambulatory Visit: Admitting: Family Medicine

## 2023-08-01 ENCOUNTER — Encounter: Attending: Internal Medicine | Admitting: Skilled Nursing Facility1

## 2023-08-01 VITALS — Ht 60.0 in | Wt 135.3 lb

## 2023-08-01 DIAGNOSIS — E1149 Type 2 diabetes mellitus with other diabetic neurological complication: Secondary | ICD-10-CM | POA: Insufficient documentation

## 2023-08-01 DIAGNOSIS — E1165 Type 2 diabetes mellitus with hyperglycemia: Secondary | ICD-10-CM | POA: Insufficient documentation

## 2023-08-01 DIAGNOSIS — I130 Hypertensive heart and chronic kidney disease with heart failure and stage 1 through stage 4 chronic kidney disease, or unspecified chronic kidney disease: Secondary | ICD-10-CM | POA: Diagnosis not present

## 2023-08-01 DIAGNOSIS — E1122 Type 2 diabetes mellitus with diabetic chronic kidney disease: Secondary | ICD-10-CM | POA: Insufficient documentation

## 2023-08-01 DIAGNOSIS — Z79899 Other long term (current) drug therapy: Secondary | ICD-10-CM | POA: Diagnosis not present

## 2023-08-01 DIAGNOSIS — N184 Chronic kidney disease, stage 4 (severe): Secondary | ICD-10-CM | POA: Insufficient documentation

## 2023-08-01 DIAGNOSIS — Z713 Dietary counseling and surveillance: Secondary | ICD-10-CM | POA: Insufficient documentation

## 2023-08-01 DIAGNOSIS — Z794 Long term (current) use of insulin: Secondary | ICD-10-CM | POA: Diagnosis present

## 2023-08-01 NOTE — Progress Notes (Signed)
 Medical Nutrition Therapy  Appointment Start time:  3:09  Appointment End time:  4:20  Primary concerns today: how to eat with CKD  Referral diagnosis: n18.9   NUTRITION ASSESSMENT   Lifestyle & Dietary Hx Other Diagnosis: DM Stage 5  HTN Seizures   DM medications: 8 units 8pm sometimes forgets so not every night; multiple low blood sugars reported per week advised pt to contact her doctor about continued insulin  management to communicate lows continue with lowered insulin  dose   24 hr recall: wakes around 10-11am or going to work 9am Breakfast 10am-11am: plantain spinach scallion cloves carrots garlic or eggs Snack: apple Second meal: sandwich  or skipped  Snack: Third meal 6-7pm: rice + chicken + broccoli  Beverages: water, gingerale, coffee   63 early in the morning, checks every night before taking insulin     Pt arrives with her supportive daughter whom she lives with.  Pt states she has read on the internet what Dialysis can do to you: Dietitian educated pt on the pitfalls of googling medical advice leading to ill informed decisions.  Pt states she does not want to get dialysis because you feel terrible after and might lose a leg and does not want to lose her job: Dietitian educated pt on what Dialysis is and how it works what situations may lead to feeling unwell after Dialysis: Dietitian advised pt the decision is hers to make but making a well informed decision is important: Dietitian educated pt on her latest GFR reading of 3  Estimated daily fluid intake:  oz Supplements:  Sleep:  Stress / self-care:  Current average weekly physical activity: ADL's   NUTRITION INTERVENTION  Nutrition education (E-1) on the following topics:  Creation of balanced and diverse meals to increase the intake of nutrient-rich foods that provide essential vitamins, minerals, fiber, and phytonutrients Variety of Fruits and Vegetables: Aim for a colorful array of fruits and vegetables  to ensure a wide range of nutrients. Include a mix of leafy greens, berries, citrus fruits, cruciferous vegetables, and more. Whole Grains: Choose whole grains over refined grains. Examples include brown rice, quinoa, oats, whole wheat, and barley. Lean Proteins: Include lean sources of protein, such as poultry, fish, tofu, legumes, beans, lentils, and low-fat dairy products. Limit red and processed meats. Healthy Fats: Incorporate sources of healthy fats, including avocados, nuts, seeds, and olive oil. Limit saturated and trans fats found in fried and processed foods. Dairy or Dairy Alternatives: Choose low-fat or fat-free dairy products, or plant-based alternatives like almond or soy milk. Portion Control: Be mindful of portion sizes to avoid overeating. Pay attention to hunger and satisfaction cues. Limit Added Sugars: Minimize the consumption of sugary beverages, snacks, and desserts. Check food labels for added sugars and opt for natural sources of sweetness such as whole fruits. Hydration: Drink plenty of water throughout the day. Limit sugary drinks and excessive caffeine intake. Moderate Sodium Intake: Reduce the consumption of high-sodium foods. Use herbs and spices for flavor instead of excessive salt. Meal Planning and Preparation: Plan and prepare meals ahead of time to make healthier choices more convenient. Include a mix of food groups in each meal. Limit Processed Foods: Minimize the intake of highly processed and packaged foods that are often high in added sugars, salt, and unhealthy fats. Regular Physical Activity: Combine a healthy diet with regular physical activity for overall well-being. Aim for at least 150 minutes of moderate-intensity aerobic exercise per week, along with strength training. Moderation and Balance: Enjoy treats  and indulgent foods in moderation, emphasizing balance rather than strict restriction. Hypoglycemia Education:   Recognition: Trained  pt to recognize the signs and symptoms of hypoglycemia, which can include shakiness, sweating, dizziness, confusion, irritability, weakness, hunger, and rapid heartbeat.  Monitoring: Regular blood glucose monitoring to detect low blood sugar levels early. This may involve using a glucose meter or continuous glucose monitoring system.  Treatment Thresholds: Establishing specific blood glucose thresholds at which action should be taken to treat hypoglycemia. These thresholds may vary depending on factors such as age, individual health status, and type of diabetes. Below 70 is considered low and warrants a need to correct.   Treatment Options: Provided guidance on appropriate treatment options for hypoglycemia. Common treatments include consuming fast-acting carbohydrates such as glucose tablets, fruit juice, or glucose gel. In some cases, glucagon injection kits may be used for severe hypoglycemia when the individual is unable to consume carbohydrates orally; resting for 15 minutes and then rechecking, once blood sugar is back to the normal range eating a snack or meal with protein  Follow-Up: Ensuring appropriate follow-up care after a hypoglycemic episode, including monitoring for recurrent episodes   Education: Offering education and support to individuals with hypoglycemia and their caregivers on how to prevent and manage hypoglycemia, including the importance of balanced meals, regular exercise, medication adherence, and the role of blood glucose monitoring.  Emergency Procedures: Providing clear instructions on when to seek emergency medical assistance for severe hypoglycemia that does not respond to initial treatment or for individuals who are unconscious or unable to swallow.  Prevention is important as hypoglycemia can be harmful with such examples as loss of consciousness and cardiovascular injury.   Handouts Provided Include  Renal healthy MyPlate  Learning Style & Readiness for  Change Teaching method utilized: Visual & Auditory  Demonstrated degree of understanding via: Teach Back  Barriers to learning/adherence to lifestyle change: misinformation   Goals Established by Pt I will further discuss my dialysis options with my nephrologist  I will eat throughout the day avoiding snacks as meals to avoid low blood sugars   MONITORING & EVALUATION Dietary intake, weekly physical activity  Next Steps  Patient is to call or email with any future questions or concerns.

## 2023-08-02 ENCOUNTER — Telehealth: Payer: Self-pay | Admitting: Family Medicine

## 2023-08-02 NOTE — Telephone Encounter (Signed)
 Secure message received from [employer]:  Good morning, my name is Chakyra. I am contacting you from [employer] in regard to Jill Jefferson, Jill Jefferson [id number]. We have received a Palliative Referral for this patient and will have our Palliative Care Team follow for care. We are obligated to notify the PCP. If you have any questions or concerns, please contact the office at [phone]. Thanks!

## 2023-10-09 ENCOUNTER — Telehealth (HOSPITAL_COMMUNITY): Payer: Self-pay

## 2023-10-09 NOTE — Telephone Encounter (Signed)
 Pt's chart was accessed due to pt being on a backlogged list of patients needing assistance with PCP placement.  Per chart review, pt has since been seen by PCP. No further action taken at this time.

## 2023-10-21 ENCOUNTER — Other Ambulatory Visit: Payer: Self-pay

## 2023-10-21 ENCOUNTER — Emergency Department (HOSPITAL_COMMUNITY)

## 2023-10-21 ENCOUNTER — Inpatient Hospital Stay (HOSPITAL_COMMUNITY)
Admission: EM | Admit: 2023-10-21 | Discharge: 2023-10-30 | DRG: 264 | Disposition: A | Discharge status: Skilled Nursing Facility | Attending: Internal Medicine | Admitting: Internal Medicine

## 2023-10-21 ENCOUNTER — Encounter (HOSPITAL_COMMUNITY): Payer: Self-pay

## 2023-10-21 DIAGNOSIS — Z8249 Family history of ischemic heart disease and other diseases of the circulatory system: Secondary | ICD-10-CM | POA: Diagnosis not present

## 2023-10-21 DIAGNOSIS — E874 Mixed disorder of acid-base balance: Secondary | ICD-10-CM | POA: Diagnosis present

## 2023-10-21 DIAGNOSIS — E878 Other disorders of electrolyte and fluid balance, not elsewhere classified: Secondary | ICD-10-CM | POA: Diagnosis present

## 2023-10-21 DIAGNOSIS — D509 Iron deficiency anemia, unspecified: Secondary | ICD-10-CM | POA: Diagnosis present

## 2023-10-21 DIAGNOSIS — Z6822 Body mass index (BMI) 22.0-22.9, adult: Secondary | ICD-10-CM

## 2023-10-21 DIAGNOSIS — Z7189 Other specified counseling: Secondary | ICD-10-CM | POA: Diagnosis not present

## 2023-10-21 DIAGNOSIS — E871 Hypo-osmolality and hyponatremia: Secondary | ICD-10-CM | POA: Diagnosis present

## 2023-10-21 DIAGNOSIS — E876 Hypokalemia: Secondary | ICD-10-CM | POA: Diagnosis present

## 2023-10-21 DIAGNOSIS — Z794 Long term (current) use of insulin: Secondary | ICD-10-CM | POA: Diagnosis not present

## 2023-10-21 DIAGNOSIS — D649 Anemia, unspecified: Secondary | ICD-10-CM

## 2023-10-21 DIAGNOSIS — Z7982 Long term (current) use of aspirin: Secondary | ICD-10-CM

## 2023-10-21 DIAGNOSIS — E278 Other specified disorders of adrenal gland: Secondary | ICD-10-CM | POA: Diagnosis present

## 2023-10-21 DIAGNOSIS — N186 End stage renal disease: Secondary | ICD-10-CM | POA: Diagnosis present

## 2023-10-21 DIAGNOSIS — D631 Anemia in chronic kidney disease: Secondary | ICD-10-CM | POA: Diagnosis present

## 2023-10-21 DIAGNOSIS — G8929 Other chronic pain: Secondary | ICD-10-CM | POA: Diagnosis present

## 2023-10-21 DIAGNOSIS — M25552 Pain in left hip: Secondary | ICD-10-CM | POA: Diagnosis present

## 2023-10-21 DIAGNOSIS — N289 Disorder of kidney and ureter, unspecified: Principal | ICD-10-CM

## 2023-10-21 DIAGNOSIS — I10 Essential (primary) hypertension: Secondary | ICD-10-CM | POA: Diagnosis not present

## 2023-10-21 DIAGNOSIS — G40909 Epilepsy, unspecified, not intractable, without status epilepticus: Secondary | ICD-10-CM | POA: Diagnosis present

## 2023-10-21 DIAGNOSIS — E1122 Type 2 diabetes mellitus with diabetic chronic kidney disease: Secondary | ICD-10-CM | POA: Diagnosis present

## 2023-10-21 DIAGNOSIS — I5022 Chronic systolic (congestive) heart failure: Secondary | ICD-10-CM | POA: Diagnosis present

## 2023-10-21 DIAGNOSIS — N185 Chronic kidney disease, stage 5: Secondary | ICD-10-CM | POA: Diagnosis not present

## 2023-10-21 DIAGNOSIS — Z833 Family history of diabetes mellitus: Secondary | ICD-10-CM

## 2023-10-21 DIAGNOSIS — M25551 Pain in right hip: Secondary | ICD-10-CM | POA: Diagnosis present

## 2023-10-21 DIAGNOSIS — R42 Dizziness and giddiness: Secondary | ICD-10-CM

## 2023-10-21 DIAGNOSIS — E440 Moderate protein-calorie malnutrition: Secondary | ICD-10-CM | POA: Diagnosis present

## 2023-10-21 DIAGNOSIS — E1136 Type 2 diabetes mellitus with diabetic cataract: Secondary | ICD-10-CM | POA: Diagnosis present

## 2023-10-21 DIAGNOSIS — I5033 Acute on chronic diastolic (congestive) heart failure: Secondary | ICD-10-CM | POA: Diagnosis not present

## 2023-10-21 DIAGNOSIS — Z515 Encounter for palliative care: Secondary | ICD-10-CM | POA: Diagnosis not present

## 2023-10-21 DIAGNOSIS — I132 Hypertensive heart and chronic kidney disease with heart failure and with stage 5 chronic kidney disease, or end stage renal disease: Secondary | ICD-10-CM | POA: Diagnosis present

## 2023-10-21 DIAGNOSIS — R7309 Other abnormal glucose: Secondary | ICD-10-CM

## 2023-10-21 DIAGNOSIS — Z992 Dependence on renal dialysis: Secondary | ICD-10-CM | POA: Diagnosis not present

## 2023-10-21 LAB — CBC
HCT: 23.6 % — ABNORMAL LOW (ref 36.0–46.0)
Hemoglobin: 7.5 g/dL — ABNORMAL LOW (ref 12.0–15.0)
MCH: 25.1 pg — ABNORMAL LOW (ref 26.0–34.0)
MCHC: 31.8 g/dL (ref 30.0–36.0)
MCV: 78.9 fL — ABNORMAL LOW (ref 80.0–100.0)
Platelets: 209 K/uL (ref 150–400)
RBC: 2.99 MIL/uL — ABNORMAL LOW (ref 3.87–5.11)
RDW: 14.7 % (ref 11.5–15.5)
WBC: 8.7 K/uL (ref 4.0–10.5)
nRBC: 0 % (ref 0.0–0.2)

## 2023-10-21 LAB — COMPREHENSIVE METABOLIC PANEL WITH GFR
ALT: 29 U/L (ref 0–44)
AST: 23 U/L (ref 15–41)
Albumin: 3.6 g/dL (ref 3.5–5.0)
Alkaline Phosphatase: 118 U/L (ref 38–126)
Anion gap: 26 — ABNORMAL HIGH (ref 5–15)
BUN: 233 mg/dL — ABNORMAL HIGH (ref 8–23)
CO2: 29 mmol/L (ref 22–32)
Calcium: 7.7 mg/dL — ABNORMAL LOW (ref 8.9–10.3)
Chloride: 70 mmol/L — ABNORMAL LOW (ref 98–111)
Creatinine, Ser: 14 mg/dL — ABNORMAL HIGH (ref 0.44–1.00)
GFR, Estimated: 3 mL/min — ABNORMAL LOW (ref >60)
Glucose, Bld: 282 mg/dL — ABNORMAL HIGH (ref 70–99)
Potassium: 3.3 mmol/L — ABNORMAL LOW (ref 3.5–5.1)
Sodium: 125 mmol/L — ABNORMAL LOW (ref 135–145)
Total Bilirubin: 0.4 mg/dL (ref 0.0–1.2)
Total Protein: 7.3 g/dL (ref 6.5–8.1)

## 2023-10-21 LAB — IRON AND TIBC
Iron: 34 ug/dL (ref 28–170)
Saturation Ratios: 13 % (ref 10.4–31.8)
TIBC: 260 ug/dL (ref 250–450)
UIBC: 227 ug/dL

## 2023-10-21 LAB — LIPASE, BLOOD: Lipase: 463 U/L — ABNORMAL HIGH (ref 11–51)

## 2023-10-21 LAB — FERRITIN: Ferritin: 897 ng/mL — ABNORMAL HIGH (ref 11–307)

## 2023-10-21 LAB — CBG MONITORING, ED: Glucose-Capillary: 160 mg/dL — ABNORMAL HIGH (ref 70–99)

## 2023-10-21 LAB — TROPONIN T, HIGH SENSITIVITY
Troponin T High Sensitivity: 191 ng/L (ref 0–19)
Troponin T High Sensitivity: 191 ng/L (ref 0–19)

## 2023-10-21 MED ORDER — ACETAMINOPHEN 650 MG RE SUPP: 650.0000 mg | Freq: Four times a day (QID) | RECTAL | Status: DC | PRN

## 2023-10-21 MED ORDER — INSULIN ASPART 100 UNIT/ML IJ SOLN
0.0000 [IU] | INTRAMUSCULAR | Status: DC
  Administered 2023-10-22: 2 [IU] via SUBCUTANEOUS
  Administered 2023-10-22: 1 [IU] via SUBCUTANEOUS
  Administered 2023-10-23: 2 [IU] via SUBCUTANEOUS
  Filled 2023-10-21: qty 0.06

## 2023-10-21 MED ORDER — ONDANSETRON HCL 4 MG/2ML IJ SOLN
4.0000 mg | Freq: Four times a day (QID) | INTRAMUSCULAR | Status: DC | PRN
  Administered 2023-10-24 – 2023-10-26 (×2): 4 mg via INTRAVENOUS
  Filled 2023-10-21 (×2): qty 2

## 2023-10-21 MED ORDER — LEVETIRACETAM 250 MG PO TABS
500.0000 mg | ORAL_TABLET | Freq: Two times a day (BID) | ORAL | Status: DC
  Administered 2023-10-21 – 2023-10-27 (×13): 500 mg via ORAL
  Filled 2023-10-21 (×4): qty 2
  Filled 2023-10-21 (×2): qty 1
  Filled 2023-10-21 (×7): qty 2

## 2023-10-21 MED ORDER — CARVEDILOL 6.25 MG PO TABS
6.2500 mg | ORAL_TABLET | Freq: Two times a day (BID) | ORAL | Status: DC
  Administered 2023-10-22 – 2023-10-30 (×17): 6.25 mg via ORAL
  Filled 2023-10-21 (×7): qty 1
  Filled 2023-10-21: qty 2
  Filled 2023-10-21 (×9): qty 1

## 2023-10-21 MED ORDER — ACETAMINOPHEN 325 MG PO TABS
650.0000 mg | ORAL_TABLET | Freq: Four times a day (QID) | ORAL | Status: DC | PRN
  Administered 2023-10-22: 650 mg via ORAL
  Filled 2023-10-21: qty 2

## 2023-10-21 MED ORDER — SODIUM CHLORIDE 0.9 % IV BOLUS
500.0000 mL | Freq: Once | INTRAVENOUS | Status: AC
  Administered 2023-10-21: 500 mL via INTRAVENOUS

## 2023-10-21 MED ORDER — INSULIN GLARGINE 100 UNIT/ML ~~LOC~~ SOLN
10.0000 [IU] | Freq: Every day | SUBCUTANEOUS | Status: DC
  Administered 2023-10-21: 10 [IU] via SUBCUTANEOUS
  Filled 2023-10-21: qty 0.1

## 2023-10-21 MED ORDER — MECLIZINE HCL 25 MG PO TABS
25.0000 mg | ORAL_TABLET | Freq: Once | ORAL | Status: AC
  Administered 2023-10-21: 25 mg via ORAL
  Filled 2023-10-21: qty 1

## 2023-10-21 NOTE — ED Notes (Signed)
 Recalled-hospitalist @ 2100

## 2023-10-21 NOTE — ED Triage Notes (Signed)
 Patient has had on and off dizziness episodes for over a month. The worst episode is today. Feels weak. Has been vomiting with diarrhea.

## 2023-10-21 NOTE — H&P (Signed)
 History and Physical    Ofelia Podolski FMW:[id number] DOB: [birth year] DOA: 10/21/2023  PCP: Shelda Atlas, MD  Patient coming from: Home  Chief Complaint: Dizziness  HPI: Ronna Meiring is a 63 y.o. female with medical history significant of CKD stage V, seizure disorder, CHF, insulin -dependent type 2 diabetes, cataract.  She was admitted the hospital 7/03/14/08/2025 for CHF exacerbation and AKI on CKD stage V but patient was hesitant to start hemodialysis.  She was discharged on metolazone  5 mg daily and torsemide  100 mg twice daily.  Patient presents to the ED today for evaluation of dizziness, generalized weakness/progressive malaise, generalized itching, nausea/vomiting, and intermittent chest tightness for the past 1 month.  Reports dizziness/unsteadiness when she tries to walk but no room spinning sensation.  Also having nausea and vomiting but no abdominal pain or diarrhea.  Her oral intake has been poor but she is having bowel movements whenever she is able to eat.  Also reporting intermittent nonexertional substernal chest tightness but denies any chest pain/pressure/tightness at this time.  Denies shortness of breath.  No other complaints.  ED Course: Hypertensive with SBP up to 170s but remainder of vital signs stable.  Labs showing no leukocytosis, hemoglobin 7.5 (previously 9.0 on 07/24/2023), MCV 78.9, sodium 125, potassium 3.3, chloride 70, bicarb 29, anion gap 26, glucose 282, BUN 333, creatinine 14.0 (previously BUN 110 and creatinine 12.32 on 07/24/2023), calcium  7.7 (chronically low), albumin 3.6, normal LFTs, lipase 463, troponin 191> 191.  EKG without acute ischemic changes.  Chest x-ray, brain MRI, and CT abdomen pelvis showing no acute findings.  Patient was given meclizine.  Nephrology consulted and recommended giving 500 mL IV fluids.  Patient was given 500 mL normal saline.  Review of Systems:  Review of Systems  All other systems reviewed and are negative.   Past Medical  History:  Diagnosis Date   Diabetes (HCC)    Edema    Focal neurological deficit 09/01/2020   H. pylori infection 06/2019   H. pylori infection 06/2019   Hyperosmolar hyperglycemic state (HHS) (HCC) 09/01/2020   Hypertension    Proteinuria 11/2018   Seizures (HCC)    Status epilepticus (HCC) 05/10/2021    Past Surgical History:  Procedure Laterality Date   BRAIN SURGERY Left    1990's   CESAREAN SECTION     history of 4 c-sections     reports that she has never smoked. She has never used smokeless tobacco. She reports that she does not drink alcohol and does not use drugs.  No Known Allergies  Family History  Problem Relation Age of Onset   Diabetes Mother    Hypertension Mother     Prior to Admission medications   Medication Sig Start Date End Date Taking? Authorizing Provider  aspirin  EC 81 MG tablet Take 81 mg by mouth daily. Swallow whole.    [provider]  carvedilol  (COREG ) 6.25 MG tablet Take 1 tablet (6.25 mg total) by mouth 2 (two) times daily with a meal. 07/18/23   Rojelio Nest, DO  insulin  degludec (TRESIBA  FLEXTOUCH) 100 UNIT/ML FlexTouch Pen inject 10 units every evening Patient taking differently: Inject 10-14 Units into the skin every evening. Inject 10-14 units with evening meal depending on blood sugar. 12/11/21     Insulin  Pen Needle (BD PEN NEEDLE NANO U/F) 32G X 4 MM MISC for insulin  injections qdDx: E11.65 on insulin  06/08/21     levETIRAcetam  (KEPPRA ) 500 MG tablet Take 1 tablet (500 mg total) by mouth 2 (two)  times daily. 04/02/23   Almarie Waddell [name], NP  metolazone  (ZAROXOLYN ) 5 MG tablet Take 1 tablet (5 mg total) by mouth daily. 07/18/23   Rojelio Nest, DO  torsemide  (DEMADEX ) 100 MG tablet Take 1 tablet (100 mg total) by mouth 2 (two) times daily. 07/18/23 10/16/23  Rojelio Nest, DO    Physical Exam: Vitals:   10/21/23 1530 10/21/23 1531 10/21/23 2030  BP: (!) 148/76  (!) 175/76  Pulse: 71  70  Resp: 19  18  Temp: 98.2 F (36.8 C)   98.7 F (37.1 C)  TempSrc: Oral  Oral  SpO2: 98%  96%  Weight:  61.2 kg   Height:  5' (1.524 m)     Physical Exam Vitals reviewed.  Constitutional:      General: She is not in acute distress. HENT:     Head: Normocephalic and atraumatic.  Eyes:     Extraocular Movements: Extraocular movements intact.  Cardiovascular:     Rate and Rhythm: Normal rate and regular rhythm.     Heart sounds: Normal heart sounds.  Pulmonary:     Effort: Pulmonary effort is normal. No respiratory distress.     Breath sounds: Normal breath sounds.  Abdominal:     General: Bowel sounds are normal. There is no distension.     Palpations: Abdomen is soft.     Tenderness: There is no abdominal tenderness. There is no guarding.  Musculoskeletal:     Cervical back: Normal range of motion.     Comments: Mild nonpitting bilateral pedal edema  Skin:    General: Skin is warm and dry.  Neurological:     General: No focal deficit present.     Mental Status: She is alert and oriented to person, place, and time.     Labs on Admission: I have personally reviewed following labs and imaging studies  CBC: Recent Labs  Lab 10/21/23 1652  WBC 8.7  HGB 7.5*  HCT 23.6*  MCV 78.9*  PLT 209   Basic Metabolic Panel: Recent Labs  Lab 10/21/23 1652  NA 125*  K 3.3*  CL 70*  CO2 29  GLUCOSE 282*  BUN 233*  CREATININE 14.00*  CALCIUM  7.7*   GFR: Estimated Creatinine Clearance: 3.5 mL/min (A) (by C-G formula based on SCr of 14 mg/dL (H)). Liver Function Tests: Recent Labs  Lab 10/21/23 1652  AST 23  ALT 29  ALKPHOS 118  BILITOT 0.4  PROT 7.3  ALBUMIN 3.6   Recent Labs  Lab 10/21/23 1652  LIPASE 463*   No results for input(s): AMMONIA in the last 168 hours. Coagulation Profile: No results for input(s): INR, PROTIME in the last 168 hours. Cardiac Enzymes: No results for input(s): CKTOTAL, CKMB, CKMBINDEX, TROPONINI in the last 168 hours. BNP (last 3 results) Recent Labs     07/11/23 1625  PROBNP 33,608.0*   HbA1C: No results for input(s): HGBA1C in the last 72 hours. CBG: No results for input(s): GLUCAP in the last 168 hours. Lipid Profile: No results for input(s): CHOL, HDL, LDLCALC, TRIG, CHOLHDL, LDLDIRECT in the last 72 hours. Thyroid  Function Tests: No results for input(s): TSH, T4TOTAL, FREET4, T3FREE, THYROIDAB in the last 72 hours. Anemia Panel: No results for input(s): VITAMINB12, FOLATE, FERRITIN, TIBC, IRON , RETICCTPCT in the last 72 hours. Urine analysis:    Component Value Date/Time   COLORURINE STRAW (A) 07/24/2023 1011   APPEARANCEUR CLEAR 07/24/2023 1011   LABSPEC 1.009 07/24/2023 1011   PHURINE 7.0 07/24/2023 1011  GLUCOSEU 150 (A) 07/24/2023 1011   HGBUR NEGATIVE 07/24/2023 1011   BILIRUBINUR NEGATIVE 07/24/2023 1011   BILIRUBINUR neg 06/23/2019 1148   KETONESUR NEGATIVE 07/24/2023 1011   PROTEINUR >=300 (A) 07/24/2023 1011   UROBILINOGEN 0.2 06/23/2019 1148   NITRITE NEGATIVE 07/24/2023 1011   LEUKOCYTESUR NEGATIVE 07/24/2023 1011    Radiological Exams on Admission: CT ABDOMEN PELVIS WO CONTRAST Result Date: 10/21/2023 EXAM: CT ABDOMEN AND PELVIS WITHOUT CONTRAST 10/21/2023 07:24:58 PM TECHNIQUE: CT of the abdomen and pelvis was performed without the administration of intravenous contrast. Multiplanar reformatted images are provided for review. Automated exposure control, iterative reconstruction, and/or weight-based adjustment of the mA/kV was utilized to reduce the radiation dose to as low as reasonably achievable. COMPARISON: CT abdomen and pelvis 07/11/2023. CLINICAL HISTORY: Abdominal pain, acute, nonlocalized. Patient has had on and off dizziness episodes for over a month. The worst episode is today. Feels weak. Has been vomiting with diarrhea. Abdominal pain, acute, nonlocalized. Patient has had on and off dizziness episodes for over a month. The worst episode is today. Feels weak. Has  been vomiting with diarrhea. FINDINGS: LOWER CHEST: Scattered opacities in the right lung base. No pleural effusion. LIVER: The liver is unremarkable. GALLBLADDER AND BILE DUCTS: Gallbladder is unremarkable. No biliary ductal dilatation. SPLEEN: No acute abnormality. PANCREAS: No acute abnormality. ADRENAL GLANDS: Indeterminate right adrenal nodule measuring 16 mm. Left adrenal gland within normal limits. KIDNEYS, URETERS AND BLADDER: Mild right renal atrophy, unchanged. Bilateral renal cysts appear similar to prior. There is a punctate nonobstructing left renal calculus. No hydronephrosis. No perinephric or periureteral stranding. Urinary bladder is unremarkable. Per consensus, no follow-up is needed for simple Bosniak type 1 and 2 renal cysts, unless the patient has a malignancy history or risk factors. GI AND BOWEL: Stomach demonstrates no acute abnormality. The appendix appears within normal limits. Appendicoliths are present. There is no bowel obstruction. PERITONEUM AND RETROPERITONEUM: No ascites. No free air. VASCULATURE: Atherosclerotic calcifications of the aorta and branch vessels. Peripheral vascular calcifications are present. LYMPH NODES: No lymphadenopathy. REPRODUCTIVE ORGANS: No acute abnormality. BONES AND SOFT TISSUES: No acute osseous abnormality. Wide mouth umbilical hernia present, small in size. IMPRESSION: 1. No acute findings in the abdomen or pelvis 2. Indeterminate 16 mm right adrenal nodule; recommend nonemergent adrenal protocol CT or MRI for characterization 3. Punctate nonobstructing left renal calculus without hydronephrosis 4. Right basilar airspace opacities fracture / inflammatory. Electronically signed by: Greig Pique MD 10/21/2023 07:47 PM EDT RP Workstation: [id number]   MR BRAIN WO CONTRAST Result Date: 10/21/2023 EXAM: MR Brain without Intravenous Contrast. CLINICAL HISTORY: Neuro deficit, acute, stroke suspected. TECHNIQUE: Magnetic resonance images of the brain without  intravenous contrast in multiple planes. CONTRAST: Without. COMPARISON: CT head 01/01/2023 and MRI head 09/02/2020. FINDINGS: BRAIN: No restricted diffusion to indicate acute infarction. No intracranial mass or hemorrhage. No midline shift or extra-axial fluid collection. No cerebellar tonsillar ectopia. The central arterial and venous flow voids are patent. Postsurgical changes of left occipital craniotomy with similar appearance of underlying encephalomalacia. There is asymmetric volume loss of the left hippocampus. Susceptibility in the left occipital lobe which may reflect postsurgical changes. VENTRICLES: No hydrocephalus. ORBITS: Right lens replacement. SINUSES AND MASTOIDS: The sinuses and mastoid air cells are clear. BONES: Postsurgical changes of left occipital craniotomy. IMPRESSION: 1. No acute findings. 2. Postsurgical changes of left occipital craniotomy with underlying encephalomalacia. 3. Asymmetric volume loss of the left hippocampus. Electronically signed by: Donnice Mania MD 10/21/2023 06:48 PM EDT RP Workstation: [id number]  DG Chest Port 1 View Result Date: 10/21/2023 CLINICAL DATA:  Intermittent dizziness.  Vomiting and diarrhea. EXAM: PORTABLE CHEST 1 VIEW COMPARISON:  07/24/2023. FINDINGS: Trachea is midline. Heart is at the upper limits of normal in size to mildly enlarged. Lungs are clear. No pleural fluid. IMPRESSION: No acute findings. Electronically Signed   By: Newell Eke M.D.   On: 10/21/2023 17:07    EKG: Independently reviewed.  Sinus rhythm, no significant change since previous tracing from July 2025.  Assessment and Plan  CKD stage V now progressing to uremia, significant azotemia Hyponatremia Mild hypokalemia Hypochloremia Chronic hypocalcemia Management per nephrology.  Patient was given 500 mL normal saline in the ED per nephrology recommendation.  Recommended not replacing potassium.  Nephrology had a discussion with the patient and her daughter regarding  starting hemodialysis but patient remains undecided.  Keep n.p.o. at midnight in case [facility] ordered.  Admit to [facility].  Repeat metabolic panel in the morning.    Acute on chronic microcytic anemia In the setting of advanced CKD.  Patient is not endorsing any symptoms of GI bleed or any other bleeding.  Checking iron , ferritin, and TIBC.  Type and screen, monitor H&H.  Nephrology will consider ESA based upon patient's clinical objectives.  Nausea and vomiting Likely due to uremia.  Normal LFTs.  Lipase 463, however, no abdominal pain or tenderness.  CT abdomen pelvis showing no acute findings/no acute pancreatic abnormality.  Antiemetic as needed.  Dizziness In the setting of problems listed above.  Patient was given meclizine in the ED.  Brain MRI showing no acute findings. PT consulted for vestibular evaluation, fall precautions.  Elevated troponin Intermittent nonexertional chest tightness Troponin elevated in the setting of advanced CKD but stable, not consistent with ACS.  EKG without acute ischemic changes.  Patient is not endorsing active chest pain/tightness/pressure at this time and resting comfortably.  Incidental adrenal nodule CT showing indeterminate 16 mm right adrenal nodule.  Radiologist recommending nonemergent adrenal protocol CT or MRI for characterization.  Hypertension SBP currently in the 160s.  Continue Coreg .  Holding diuretics at this time given hypokalemia, will defer to nephrology whether or not to resume in the morning depending on if patient decides to start dialysis.  Seizure disorder Continue Keppra .  Chronic HFmrEF Last echo done in July 2025 showing EF 40 to 45%.  No pulmonary edema on chest x-ray.  Insulin -dependent type 2 diabetes Glucose 282.  Last hemoglobin A1c 7.1 on 07/18/2023, repeat ordered.  Continue long-acting insulin  (Lantus  10 units every evening).  Placed on very sensitive sliding scale insulin  every 4 hours for now as patient will  be n.p.o. after midnight.  DVT prophylaxis: SCDs Code Status: Full Code (discussed with the patient) Level of care: Progressive Care Unit Admission status: It is my clinical opinion that admission to INPATIENT is reasonable and necessary because of the expectation that this patient will require hospital care that crosses at least 2 midnights to treat this condition based on the medical complexity of the problems presented.  Given the aforementioned information, the predictability of an adverse outcome is felt to be significant.  Editha Ram MD [facility]  If 7PM-7AM, please contact night-coverage [email or web address]  10/21/2023, 9:40 PM

## 2023-10-21 NOTE — ED Provider Notes (Signed)
 [place] EMERGENCY DEPARTMENT AT [facility] Provider Note   CSN: [id number] Arrival date & time: 10/21/23  1523     Patient presents with: Dizziness  HPI Jill Jefferson is a 63 y.o. 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  She states she has been dizzy with vomiting for over a month.  She endorses room spinning sensation and loss of balance.  Her symptoms have worsened over the last couple weeks.  Also endorsing intermittent chest tightness throughout this month as well but no chest pain at this time.  She endorses some generalized abdominal pain as well.  Denies chest pain palpitations or shortness of breath.  Unsure what makes her dizziness worse.    Dizziness      Prior to Admission medications   Medication Sig Start Date End Date Taking? Authorizing Provider  Acetaminophen  Extra Strength 500 MG TABS Take 1-2 tablets by mouth every 6 (six) hours as needed. 10/08/23  Yes [provider]  carvedilol  (COREG ) 6.25 MG tablet Take 1 tablet (6.25 mg total) by mouth 2 (two) times daily with a meal. 07/18/23  Yes Rojelio Nest, DO  insulin  degludec (TRESIBA  FLEXTOUCH) 100 UNIT/ML FlexTouch Pen inject 10 units every evening Patient taking differently: Inject 10-14 Units into the skin every evening. Inject 10-14 units with evening meal depending on blood sugar. 12/11/21  Yes   levETIRAcetam  (KEPPRA ) 500 MG tablet Take 1 tablet (500 mg total) by mouth 2 (two) times daily. 04/02/23  Yes Almarie Waddell [name], NP  metolazone  (ZAROXOLYN ) 5 MG tablet Take 1 tablet (5 mg total) by mouth daily. 07/18/23  Yes Rojelio Nest, DO  ondansetron (ZOFRAN-ODT) 4 MG disintegrating tablet Take 4 mg by mouth every 8 (eight) hours as needed. 10/08/23  Yes [provider]  torsemide  (DEMADEX ) 100 MG tablet Take 100 mg by mouth 2 (two) times daily.   Yes [provider]  traMADol (ULTRAM) 50 MG tablet Take 50 mg by mouth 2  (two) times daily as needed. 10/08/23  Yes [provider]  Insulin  Pen Needle (BD PEN NEEDLE NANO U/F) 32G X 4 MM MISC for insulin  injections qdDx: E11.65 on insulin  06/08/21     torsemide  (DEMADEX ) 100 MG tablet Take 1 tablet (100 mg total) by mouth 2 (two) times daily. 07/18/23 10/16/23  Rojelio Nest, DO    Allergies: Patient has no known allergies.    Review of Systems  Neurological:  Positive for dizziness.    Physical Exam   Vitals:   10/21/23 1530 10/21/23 2030  BP: (!) 148/76 (!) 175/76  Pulse: 71 70  Resp: 19 18  Temp: 98.2 F (36.8 C) 98.7 F (37.1 C)  SpO2: 98% 96%    CONSTITUTIONAL:  well-appearing, NAD NEURO:  Alert and oriented x 3, CN 3-12 grossly intact, grip strength 5/5 bilaterally, equal and symmetric strength in upper and lower extremities, finger-to-nose is normal, no appreciable nystagmus.  Refused gait assessment because of dizziness. EYES:  eyes equal and reactive ENT/NECK:  Supple, no stridor  CARDIO:  regular rate and rhythm, appears well-perfused PULM:  No respiratory distress, CTAB GI/GU:  non-distended, soft, general tenderness MSK/SPINE:  No gross deformities, no edema, moves all extremities  SKIN:  no rash, atraumatic  *Additional and/or pertinent findings included in MDM below  (all labs ordered are listed, but only abnormal results are displayed) Labs Reviewed  CBC - Abnormal; Notable for the following components:      Result  Value   RBC 2.99 (*)    Hemoglobin 7.5 (*)    HCT 23.6 (*)    MCV 78.9 (*)    MCH 25.1 (*)    All other components within normal limits  COMPREHENSIVE METABOLIC PANEL WITH GFR - Abnormal; Notable for the following components:   Sodium 125 (*)    Potassium 3.3 (*)    Chloride 70 (*)    Glucose, Bld 282 (*)    BUN 233 (*)    Creatinine, Ser 14.00 (*)    Calcium  7.7 (*)    GFR, Estimated 3 (*)    Anion gap 26 (*)    All other components within normal limits  LIPASE, BLOOD - Abnormal; Notable for the  following components:   Lipase 463 (*)    All other components within normal limits  FERRITIN - Abnormal; Notable for the following components:   Ferritin 897 (*)    All other components within normal limits  TROPONIN T, HIGH SENSITIVITY - Abnormal; Notable for the following components:   Troponin T High Sensitivity 191 (*)    All other components within normal limits  TROPONIN T, HIGH SENSITIVITY - Abnormal; Notable for the following components:   Troponin T High Sensitivity 191 (*)    All other components within normal limits  IRON  AND TIBC  CBC  BASIC METABOLIC PANEL WITH GFR  HEMOGLOBIN A1C  TYPE AND SCREEN    EKG: EKG Interpretation Date/Time:  Monday October 21 2023 15:32:41 EDT Ventricular Rate:  74 PR Interval:  173 QRS Duration:  104 QT Interval:  422 QTC Calculation: 469 R Axis:   52  Text Interpretation: Sinus rhythm Probable left atrial enlargement Repol abnrm suggests ischemia, lateral leads no significant change since July 2025 Confirmed by Freddi Hamilton [phone]) on 10/21/2023 3:48:42 PM  Radiology: CT ABDOMEN PELVIS WO CONTRAST Result Date: 10/21/2023 EXAM: CT ABDOMEN AND PELVIS WITHOUT CONTRAST 10/21/2023 07:24:58 PM TECHNIQUE: CT of the abdomen and pelvis was performed without the administration of intravenous contrast. Multiplanar reformatted images are provided for review. Automated exposure control, iterative reconstruction, and/or weight-based adjustment of the mA/kV was utilized to reduce the radiation dose to as low as reasonably achievable. COMPARISON: CT abdomen and pelvis 07/11/2023. CLINICAL HISTORY: Abdominal pain, acute, nonlocalized. Patient has had on and off dizziness episodes for over a month. The worst episode is today. Feels weak. Has been vomiting with diarrhea. Abdominal pain, acute, nonlocalized. Patient has had on and off dizziness episodes for over a month. The worst episode is today. Feels weak. Has been vomiting with diarrhea. FINDINGS:  LOWER CHEST: Scattered opacities in the right lung base. No pleural effusion. LIVER: The liver is unremarkable. GALLBLADDER AND BILE DUCTS: Gallbladder is unremarkable. No biliary ductal dilatation. SPLEEN: No acute abnormality. PANCREAS: No acute abnormality. ADRENAL GLANDS: Indeterminate right adrenal nodule measuring 16 mm. Left adrenal gland within normal limits. KIDNEYS, URETERS AND BLADDER: Mild right renal atrophy, unchanged. Bilateral renal cysts appear similar to prior. There is a punctate nonobstructing left renal calculus. No hydronephrosis. No perinephric or periureteral stranding. Urinary bladder is unremarkable. Per consensus, no follow-up is needed for simple Bosniak type 1 and 2 renal cysts, unless the patient has a malignancy history or risk factors. GI AND BOWEL: Stomach demonstrates no acute abnormality. The appendix appears within normal limits. Appendicoliths are present. There is no bowel obstruction. PERITONEUM AND RETROPERITONEUM: No ascites. No free air. VASCULATURE: Atherosclerotic calcifications of the aorta and branch vessels. Peripheral vascular calcifications are present. LYMPH NODES:  No lymphadenopathy. REPRODUCTIVE ORGANS: No acute abnormality. BONES AND SOFT TISSUES: No acute osseous abnormality. Wide mouth umbilical hernia present, small in size. IMPRESSION: 1. No acute findings in the abdomen or pelvis 2. Indeterminate 16 mm right adrenal nodule; recommend nonemergent adrenal protocol CT or MRI for characterization 3. Punctate nonobstructing left renal calculus without hydronephrosis 4. Right basilar airspace opacities fracture / inflammatory. Electronically signed by: Greig Pique MD 10/21/2023 07:47 PM EDT RP Workstation: [id number]   MR BRAIN WO CONTRAST Result Date: 10/21/2023 EXAM: MR Brain without Intravenous Contrast. CLINICAL HISTORY: Neuro deficit, acute, stroke suspected. TECHNIQUE: Magnetic resonance images of the brain without intravenous contrast in multiple  planes. CONTRAST: Without. COMPARISON: CT head 01/01/2023 and MRI head 09/02/2020. FINDINGS: BRAIN: No restricted diffusion to indicate acute infarction. No intracranial mass or hemorrhage. No midline shift or extra-axial fluid collection. No cerebellar tonsillar ectopia. The central arterial and venous flow voids are patent. Postsurgical changes of left occipital craniotomy with similar appearance of underlying encephalomalacia. There is asymmetric volume loss of the left hippocampus. Susceptibility in the left occipital lobe which may reflect postsurgical changes. VENTRICLES: No hydrocephalus. ORBITS: Right lens replacement. SINUSES AND MASTOIDS: The sinuses and mastoid air cells are clear. BONES: Postsurgical changes of left occipital craniotomy. IMPRESSION: 1. No acute findings. 2. Postsurgical changes of left occipital craniotomy with underlying encephalomalacia. 3. Asymmetric volume loss of the left hippocampus. Electronically signed by: Donnice Mania MD 10/21/2023 06:48 PM EDT RP Workstation: [id number]   DG Chest Port 1 View Result Date: 10/21/2023 CLINICAL DATA:  Intermittent dizziness.  Vomiting and diarrhea. EXAM: PORTABLE CHEST 1 VIEW COMPARISON:  07/24/2023. FINDINGS: Trachea is midline. Heart is at the upper limits of normal in size to mildly enlarged. Lungs are clear. No pleural fluid. IMPRESSION: No acute findings. Electronically Signed   By: Newell Eke M.D.   On: 10/21/2023 17:07     .Critical Care  Performed by: Lang Norleen POUR, PA-C Authorized by: Lang Norleen POUR, PA-C   Critical care provider statement:    Critical care time (minutes):  30   Critical care was necessary to treat or prevent imminent or life-threatening deterioration of the following conditions:  Renal failure   Critical care was time spent personally by me on the following activities:  Development of treatment plan with patient or surrogate, discussions with consultants, evaluation of patient's response to  treatment, examination of patient, ordering and review of laboratory studies, ordering and review of radiographic studies, ordering and performing treatments and interventions, pulse oximetry, re-evaluation of patient's condition and review of old charts    Medications Ordered in the ED  carvedilol  (COREG ) tablet 6.25 mg (has no administration in time range)  insulin  glargine (LANTUS ) injection 10 Units (10 Units Subcutaneous Given 10/21/23 2315)  levETIRAcetam  (KEPPRA ) tablet 500 mg (500 mg Oral Given 10/21/23 2314)  acetaminophen  (TYLENOL ) tablet 650 mg (has no administration in time range)    Or  acetaminophen  (TYLENOL ) suppository 650 mg (has no administration in time range)  insulin  aspart (novoLOG ) injection 0-6 Units (has no administration in time range)  ondansetron (ZOFRAN) injection 4 mg (has no administration in time range)  meclizine (ANTIVERT) tablet 25 mg (25 mg Oral Given 10/21/23 1845)  sodium chloride  0.9 % bolus 500 mL (0 mLs Intravenous Stopped 10/21/23 2137)    Clinical Course as of 10/21/23 2323  Mon Oct 21, 2023  1906 Discussed patient with Dr. Marlee of nephrology, he recommended 500 mL of fluid and advised that he would see the patient  and requested hospital admission at [facility] for further management. [JR]    Clinical Course User Index [JR] Lang Norleen POUR, PA-C                                 Medical Decision Making Amount and/or Complexity of Data Reviewed Labs: ordered. Radiology: ordered.  Risk Decision regarding hospitalization.   Initial Impression and Ddx 63 year old well-appearing female presenting for dizziness and vomiting.  Exam notable for generalized abdominal tenderness but otherwise reassuring.  DDx includes stroke, intra-abdominal infection, bowel obstruction, AKI, metabolic encephalopathy.  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  MRI brain without acute findings.  No acute findings on chest x-ray.  -I personally reviewed and interpreted EKG which revealed sinus rhythm  Patient Reassessment and Ultimate Disposition/Management Workup concerning for AKI versus progression of her chronic kidney disease.  Discussed patient with Dr. Marlee of nephrology. He recommended 500 mL of fluid and advised that he would see the patient and requested hospital admission at [facility] for further management.  Admitted to hospital service with Dr. Alfornia.  Patient management required discussion with the following services or consulting groups:  Hospitalist Service and Nephrology  Complexity of Problems Addressed Acute complicated illness or Injury  Additional Data Reviewed and Analyzed Further history obtained from: Past medical history and medications listed in the EMR and Prior ED visit notes  Patient Encounter Risk Assessment Consideration of hospitalization      Final diagnoses:  Renal dysfunction    ED Discharge Orders     None          Lang Norleen POUR, PA-C 10/21/23 2323    Freddi Hamilton, MD 10/22/23 1538

## 2023-10-21 NOTE — Consult Note (Signed)
 Jill Jefferson Admit Date: 10/21/2023 10/21/2023 Jill Jefferson Requesting Physician:  Freddi MD  Reason for Consult:  Uremia, N/V, Azotemia HPI:  53F PMH including progressive stage V CKD followed in our office, DM2, hypertension, history of seizure disorder, reported remote history of brain surgery, details not otherwise received who presented to the ED earlier today with dizziness, progressive malaise, nausea/vomiting especially after eating, fatigue, dry mouth, and decreased mental acuity.    In the ED workup includes creatinine 14.0, BUN of 233, K3.3, bicarbonate 29, anion gap of 26, sodium of 125, glucose of 282.  Lipase is elevated at 463.  Troponin is mildly elevated at 191.  Hemoglobin 7.5, borderline microcytic.  CT abdomen and pelvis with no acute findings.  MRI of the brain with no acute findings but postsurgical changes of the left occipital lobe noted.  Patient last seen at [facility] in July 2025.  Creatinine at that time was 12.4 and BUN was 109.  She has been intermittently adherent at our office.  She has consistently stated that she does not want to receive dialysis.  Patient is accompanied by daughter in the room.  She again expresses a desire not to receive dialysis.  See below.  No significant edema.  No chest pain.  No dyspnea. .  Creat (mg/dL)  Date Value  93/98/7976 3.48 (H)   Creatinine, Ser (mg/dL)  Date Value  89/86/7974 14.00 (H)  07/24/2023 12.32 (H)  07/18/2023 11.41 (H)  07/17/2023 11.27 (H)  07/16/2023 10.94 (H)  07/15/2023 11.10 (H)  07/14/2023 10.90 (H)  07/13/2023 11.20 (H)  07/12/2023 11.04 (H)  07/11/2023 11.50 (H)  ] I/Os: No intake/output data recorded.   ROS Balance of 12 systems is negative w/ exceptions as above  PMH  Past Medical History:  Diagnosis Date   Diabetes (HCC)    Edema    Focal neurological deficit 09/01/2020   H. pylori infection 06/2019   H. pylori infection 06/2019   Hyperosmolar hyperglycemic state (HHS) (HCC)  09/01/2020   Hypertension    Proteinuria 11/2018   Seizures (HCC)    Status epilepticus (HCC) 05/10/2021   PSH  Past Surgical History:  Procedure Laterality Date   BRAIN SURGERY Left    1990's   CESAREAN SECTION     history of 4 c-sections   FH  Family History  Problem Relation Age of Onset   Diabetes Mother    Hypertension Mother    SH  reports that she has never smoked. She has never used smokeless tobacco. She reports that she does not drink alcohol and does not use drugs. Allergies No Known Allergies Home medications Prior to Admission medications   Medication Sig Start Date End Date Taking? Authorizing Provider  aspirin  EC 81 MG tablet Take 81 mg by mouth daily. Swallow whole.    [provider]  carvedilol  (COREG ) 6.25 MG tablet Take 1 tablet (6.25 mg total) by mouth 2 (two) times daily with a meal. 07/18/23   Rojelio Nest, DO  insulin  degludec (TRESIBA  FLEXTOUCH) 100 UNIT/ML FlexTouch Pen inject 10 units every evening Patient taking differently: Inject 10-14 Units into the skin every evening. Inject 10-14 units with evening meal depending on blood sugar. 12/11/21     Insulin  Pen Needle (BD PEN NEEDLE NANO U/F) 32G X 4 MM MISC for insulin  injections qdDx: E11.65 on insulin  06/08/21     levETIRAcetam  (KEPPRA ) 500 MG tablet Take 1 tablet (500 mg total) by mouth 2 (two) times daily. 04/02/23   Almarie Birmingham  B, NP  metolazone  (ZAROXOLYN ) 5 MG tablet Take 1 tablet (5 mg total) by mouth daily. 07/18/23   Rojelio Nest, DO  torsemide  (DEMADEX ) 100 MG tablet Take 1 tablet (100 mg total) by mouth 2 (two) times daily. 07/18/23 10/16/23  Rojelio Nest, DO    Current Medications Scheduled Meds: Continuous Infusions: PRN Meds:.  CBC Recent Labs  Lab 10/21/23 1652  WBC 8.7  HGB 7.5*  HCT 23.6*  MCV 78.9*  PLT 209   Basic Metabolic Panel Recent Labs  Lab 10/21/23 1652  NA 125*  K 3.3*  CL 70*  CO2 29  GLUCOSE 282*  BUN 233*  CREATININE 14.00*  CALCIUM  7.7*     Physical Exam  Blood pressure (!) 175/76, pulse 70, temperature 98.7 F (37.1 C), temperature source Oral, resp. rate 18, height 5' (1.524 m), weight 61.2 kg, SpO2 96%. GEN: Unwell appearing, yellowing of skin ENT: NCAT EYES: EOMI CV: Regular, no rub, normal S1 and S2 PULM: Diminished in the bases but otherwise clear ABD: Soft, nontender SKIN: No other rashes noted EXT: No peripheral edema NEURO: Nonfocal, no significant asterixis.  Has word finding difficulties which daughter states are new problems for her.  Assessment 65F with historical CKD 5 now progressing to uremia, significant azotemia.  Uremia with malaise, neurological symptoms, GI symptoms and severe azotemia CKD 5, now reasonable to declare ESRD Microcytic anemia Mild hypokalemia Increased anion gap metabolic acidosis with metabolic alkalosis Seizure disorder  hypertension  Plan She is presenting with progressive uremia and this is the time to start dialysis.  At length discussed this with patient and daughter.  She remains undecided.  Discussed ongoing conservative care with transition to hospice.  Discussed initiation of hemodialysis with potential further education about home modalities but daughter expresses some concerns about her suitability to do this.  This could all be sorted out and something like her transitional care unit if she does begin dialysis.  She was not ready to make a decision, we will follow back up with her tomorrow.  Extensive discussion with the patient regarding goals of care. Would not replace potassium Can give some gentle hydration in case she is hypovolemic with her poor oral intake and nausea/vomiting Check iron  levels, consider ESA based upon her clinical objectives NPO p MN in case TDC ordered   Jill KATHEE Gasman, MD  10/21/2023, 9:15 PM

## 2023-10-22 DIAGNOSIS — Z515 Encounter for palliative care: Secondary | ICD-10-CM

## 2023-10-22 DIAGNOSIS — N185 Chronic kidney disease, stage 5: Secondary | ICD-10-CM | POA: Diagnosis not present

## 2023-10-22 DIAGNOSIS — Z7189 Other specified counseling: Secondary | ICD-10-CM | POA: Diagnosis not present

## 2023-10-22 LAB — CBG MONITORING, ED
Glucose-Capillary: 109 mg/dL — ABNORMAL HIGH (ref 70–99)
Glucose-Capillary: 40 mg/dL — CL (ref 70–99)
Glucose-Capillary: 55 mg/dL — ABNORMAL LOW (ref 70–99)
Glucose-Capillary: 74 mg/dL (ref 70–99)
Glucose-Capillary: 75 mg/dL (ref 70–99)
Glucose-Capillary: 52 mg/dL — ABNORMAL LOW (ref 70–99)
Glucose-Capillary: 74 mg/dL (ref 70–99)

## 2023-10-22 LAB — CBC
HCT: 20.6 % — ABNORMAL LOW (ref 36.0–46.0)
Hemoglobin: 6.6 g/dL — CL (ref 12.0–15.0)
MCH: 25.2 pg — ABNORMAL LOW (ref 26.0–34.0)
MCHC: 32 g/dL (ref 30.0–36.0)
MCV: 78.6 fL — ABNORMAL LOW (ref 80.0–100.0)
Platelets: 209 K/uL (ref 150–400)
RBC: 2.62 MIL/uL — ABNORMAL LOW (ref 3.87–5.11)
RDW: 14.9 % (ref 11.5–15.5)
WBC: 7.6 K/uL (ref 4.0–10.5)
nRBC: 0 % (ref 0.0–0.2)

## 2023-10-22 LAB — CBC WITH DIFFERENTIAL/PLATELET
Abs Immature Granulocytes: 0.04 K/uL (ref 0.00–0.07)
Basophils Absolute: 0 K/uL (ref 0.0–0.1)
Basophils Relative: 0 %
Eosinophils Absolute: 0.1 K/uL (ref 0.0–0.5)
Eosinophils Relative: 1 %
HCT: 25.5 % — ABNORMAL LOW (ref 36.0–46.0)
Hemoglobin: 8.7 g/dL — ABNORMAL LOW (ref 12.0–15.0)
Immature Granulocytes: 1 %
Lymphocytes Relative: 9 %
Lymphs Abs: 0.8 K/uL (ref 0.7–4.0)
MCH: 26.1 pg (ref 26.0–34.0)
MCHC: 34.1 g/dL (ref 30.0–36.0)
MCV: 76.6 fL — ABNORMAL LOW (ref 80.0–100.0)
Monocytes Absolute: 0.7 K/uL (ref 0.1–1.0)
Monocytes Relative: 8 %
Neutro Abs: 7 K/uL (ref 1.7–7.7)
Neutrophils Relative %: 81 %
Platelets: 199 K/uL (ref 150–400)
RBC: 3.33 MIL/uL — ABNORMAL LOW (ref 3.87–5.11)
RDW: 14.9 % (ref 11.5–15.5)
WBC: 8.7 K/uL (ref 4.0–10.5)
nRBC: 0 % (ref 0.0–0.2)

## 2023-10-22 LAB — BASIC METABOLIC PANEL WITH GFR
Anion gap: 22 — ABNORMAL HIGH (ref 5–15)
BUN: 230 mg/dL — ABNORMAL HIGH (ref 8–23)
CO2: 34 mmol/L — ABNORMAL HIGH (ref 22–32)
Calcium: 7.7 mg/dL — ABNORMAL LOW (ref 8.9–10.3)
Chloride: 76 mmol/L — ABNORMAL LOW (ref 98–111)
Creatinine, Ser: 15.1 mg/dL — ABNORMAL HIGH (ref 0.44–1.00)
GFR, Estimated: 2 mL/min — ABNORMAL LOW (ref >60)
Glucose, Bld: 57 mg/dL — ABNORMAL LOW (ref 70–99)
Potassium: 2.8 mmol/L — ABNORMAL LOW (ref 3.5–5.1)
Sodium: 131 mmol/L — ABNORMAL LOW (ref 135–145)

## 2023-10-22 LAB — GLUCOSE, CAPILLARY: Glucose-Capillary: 221 mg/dL — ABNORMAL HIGH (ref 70–99)

## 2023-10-22 LAB — ABO/RH: ABO/RH(D): B POS

## 2023-10-22 LAB — PREPARE RBC (CROSSMATCH)

## 2023-10-22 MED ORDER — DOCUSATE SODIUM 100 MG PO CAPS
200.0000 mg | ORAL_CAPSULE | Freq: Two times a day (BID) | ORAL | Status: AC | PRN
Start: 2023-10-22 — End: ?
  Administered 2023-10-23 – 2023-10-30 (×6): 200 mg via ORAL
  Filled 2023-10-22 (×6): qty 2

## 2023-10-22 MED ORDER — SODIUM CHLORIDE 0.9% IV SOLUTION: Freq: Once | INTRAVENOUS | Status: AC

## 2023-10-22 MED ORDER — CHLORHEXIDINE GLUCONATE CLOTH 2 % EX PADS
6.0000 | MEDICATED_PAD | Freq: Every day | CUTANEOUS | Status: DC
  Administered 2023-10-23 – 2023-10-30 (×8): 6 via TOPICAL

## 2023-10-22 NOTE — Plan of Care (Signed)
   Problem: Coping: Goal: Ability to adjust to condition or change in health will improve Outcome: Progressing

## 2023-10-22 NOTE — ED Notes (Signed)
Ambulated patient to the restroom. 

## 2023-10-22 NOTE — Consult Note (Signed)
 Consultation Note Date: 10/22/2023   Patient Name: Jill Jefferson  DOB: 23-Mar-1960  MRN: [id number]  Age / Sex: 63 y.o., female  PCP: Shelda Atlas, MD Referring Physician: Odell Celinda Balo, MD  Reason for Consultation: End-stage renal disease has refused dialysis. Will need hospice care.  HPI/Patient Profile: 63 y.o. female  with past medical history of CKD 5, seizure disorder, HFrEF, DM2 on insulin  admitted on 10/21/2023 with weakness, nausea, vomiting. Found to be in ESRD with recommendations for dialysis. Palliative consulted for assistance with medical decision making.    Primary Decision Maker PATIENT  Discussion: Chart reviewed including labs, progress notes, imaging from this and previous encounters. Patient with Cr 14, BUN 233. Per review of nephrology and attending note- patient expressed uncertainty on whether she wished to do dialysis.  On evaluation patient was sleeping, but able to arouse.  She was able to tell me she is in the hospital due to her kidneys failing. She understands that she needs dialysis.  I discussed her feelings about this further. I reviewed with her the option of comfort care, hospice, and allowing natural dying process if she did not want to proceed with hospice.  She then stated she was not ready to die and would agree to trial of dialysis. We discussed that if after trying dialysis she decided she no longer wanted to continue it she could always stop and invoke hospice. Encouraged her to continue discussions with her family and her providers as she progresses.  She inquired about assistance in getting CAP benefits- she was in process of applying prior to admission- I let her know I would reach out to social work.  Advanced care planning- 30 mins Due to patient's ESRD and with her permission advanced care planning was discussed.  If she were unable to make her own  decisions she would prefer for her daughters to make decisions for her. She does not have advanced directives or HCPOA completed, and she is not interested in pursuing completion today.  We discussed her code status and CPR. Encouraged patient/family to consider DNR/DNI status understanding evidenced based poor outcomes in similar hospitalized patients, as the cause of the arrest is likely associated with chronic/terminal disease rather than a reversible acute cardio-pulmonary event. Galya shared she would like attempts made at resuscitation.   I also called patient's daughterGLENWOOD Reusing. I reviewed my discussion with her mom. Reusing is in agreement with her mom starting dialysis. Answered her questions related to dialysis process. She inquired about assistance with transportation- I let her know I would reach out to social work.      SUMMARY OF RECOMMENDATIONS -ESRD- patient in agreement with pursuing dialysis- full scope full code -TOC order placed for social needs    Code Status/Advance Care Planning:   Code Status: Full Code    Prognosis:   Unable to determine  Discharge Planning: To Be Determined  Primary Diagnoses: Present on Admission:  CKD (chronic kidney disease) stage 5, GFR less than 15 ml/min (HCC)   Review of Systems  Physical Exam  Vital Signs: BP 127/66   Pulse 63   Temp 97.6 F (36.4 C) (Oral)   Resp 10   Ht 5' (1.524 m)   Wt 61.2 kg   SpO2 97%   BMI 26.37 kg/m  Pain Scale: 0-10   Pain Score: 7    SpO2: SpO2: 97 % O2 Device:SpO2: 97 % O2 Flow Rate: .   IO: Intake/output summary:  Intake/Output Summary (Last 24 hours) at 10/22/2023 1250 Last data filed at 10/22/2023 1102 Gross per 24 hour  Intake 304 ml  Output --  Net 304 ml    LBM:   Baseline Weight: Weight: 61.2 kg Most recent weight: Weight: 61.2 kg       Thank you for this consult. Palliative medicine will continue to follow and assist as needed.   Signed by: Cassondra Stain,  AGNP-C Palliative Medicine  Time includes:   Preparing to see the patient (e.g., review of tests) Obtaining and/or reviewing separately obtained history Performing a medically necessary appropriate examination and/or evaluation Counseling and educating the patient/family/caregiver Ordering medications, tests, or procedures Referring and communicating with other health care professionals (when not reported separately) Documenting clinical information in the electronic or other health record Independently interpreting results (not reported separately) and communicating results to the patient/family/caregiver Care coordination (not reported separately) Clinical documentation   Please contact Palliative Medicine Team phone at [phone] for questions and concerns.  For individual provider: See Tracey

## 2023-10-22 NOTE — Progress Notes (Signed)
 [facility] Progress Note  Subjective:  Patient seen in ED room, no new complaints Denies shortness of breath  Vitals:   10/22/23 1100 10/22/23 1102 10/22/23 1115 10/22/23 1340  BP: 132/70 132/70 127/66 122/60  Pulse: 64 64 63 66  Resp: 14 14 10 10   Temp: 97.6 F (36.4 C) 97.6 F (36.4 C)  97.9 F (36.6 C)  TempSrc: Oral Oral  Oral  SpO2: 99%  97% 99%  Weight:      Height:        Exam: GEN: looks very tired, no distress though, yellowing of skin CV: Regular, no rub, normal S1 and S2 PULM: Diminished in the bases but otherwise clear ABD: Soft, nontender SKIN: No other rashes noted EXT: No peripheral edema NEURO: Nonfocal, no significant asterixis.  Has word finding difficulties which daughter states is new  CXR: negative   Assessment/ Plan: CKD 5/ new ESRD: pt has sig uremia with malaise, neurological symptoms, GI symptoms and severe azotemia. Pt had previously said she did not want dialysis, however she has changed her mind at this time after speaking with palliative care. Plan is for trial of HD. Have d/w pt and dtr, will need transfer to [facility] and [facility] placement. Then HD w/ mild treatments then ramp up to normal. When she is better will consult [facility] for permanent access.  Anemia ckd: Hb 6-8 here, getting prbc's today. Tsat 13% and ferritin 890. Follow, consider IV iron  and esa while here, wait til getting some dialysis.  Volume: no signs of vol overload. Has had diarrhea, n/v which is likely the reason.  HTN: getting coreg  6.25 bid, cont. BP's stable. Can hold the torsemide / zaroxolyn  for now.  Seizure disorder     Myer Fret MD  [facility] 10/22/2023, 2:41 PM  Recent Labs  Lab 10/21/23 1652 10/22/23 0755  HGB 7.5* 6.6*  ALBUMIN 3.6  --   CALCIUM  7.7* 7.7*  CREATININE 14.00* 15.10*  K 3.3* 2.8*   Recent Labs  Lab 10/21/23 2146  IRON  34  TIBC 260  FERRITIN 897*   Inpatient medications:  carvedilol   6.25 mg Oral BID WC   insulin  aspart  0-6  Units Subcutaneous Q4H   levETIRAcetam   500 mg Oral BID    acetaminophen  **OR** acetaminophen , ondansetron (ZOFRAN) IV

## 2023-10-22 NOTE — Progress Notes (Signed)
 PT Cancellation Note  Patient Details Name: Jill Jefferson MRN: [id number] DOB: Oct 17, 1960   Cancelled Treatment:    Reason Eval/Treat Not Completed: Medical issues which prohibited therapy Pt admitted with dizziness and progressive weakness due to CKD (stage 5) in need of HD and anemia with Hgb of 6.6 with orders for PRBC. PT with orders to for vestibular evaluation. Suspect that dizziness may be related to medical issues and pt to get PRBC shortly.  Will f/u with pt at later time due to low HGB and getting PRBC.  Benjiman, PT Acute Rehab [facility] [phone]   Benjiman VEAR Mulberry 10/22/2023, 12:36 PM

## 2023-10-22 NOTE — ED Notes (Signed)
 [facility] called.

## 2023-10-22 NOTE — Consult Note (Signed)
 Chief Complaint: Access for hemodialysis  Referring Provider(s): Lamar Fret, MD  Supervising Physician: {Supervising Physician:[id number]}  Patient Status: [facility] - In-pt  History of Present Illness: Jill Jefferson is a 63 y.o. female with medical history significant for ESRD, diabetes, seizure disorder. Patient presented to [facility] ED on 10/21/2023 with c/o intermittent dizziness for 1 month accompanied by vomiting, diarrhea, and progressive malaise. ED work up significant for creatinine 14 and BUN 233. Per nephrology creatinine 12.4 and BUN 109 in July 2025. Patient previously declined dialysis, but now wants to proceed with initiation of dialysis after speaking with palliative care and needs access. Nephrology following. Patient transferred to [facility] to obtain HD access and initiate dialysis on Wednesday 10/23/2023. Received request for Interventional Radiology to place tunneled dialysis catheter.  Confirms NPO since MN ***   Does not wear CPAP or use supplemental home O2 ***  *** Denies fever, chills, SOB, CP, sore throat, N/V, abd pain, blood in stool or urine, abnormal bruising, leg swelling, back pain.   Allergies Reviewed:  Patient has no known allergies.   *** Patient is Full Code  Past Medical History:  Diagnosis Date   Diabetes (HCC)    Edema    Focal neurological deficit 09/01/2020   H. pylori infection 06/2019   H. pylori infection 06/2019   Hyperosmolar hyperglycemic state (HHS) (HCC) 09/01/2020   Hypertension    Proteinuria 11/2018   Seizures (HCC)    Status epilepticus (HCC) 05/10/2021    Past Surgical History:  Procedure Laterality Date   BRAIN SURGERY Left    1990's   CESAREAN SECTION     history of 4 c-sections      Medications: Prior to Admission medications   Medication Sig Start Date End Date Taking? Authorizing Provider  Acetaminophen  Extra Strength 500 MG TABS Take 1-2 tablets by mouth every 6 (six) hours as needed. 10/08/23  Yes [provider]  carvedilol  (COREG ) 6.25 MG tablet Take 1 tablet (6.25 mg total) by mouth 2 (two) times daily with a meal. 07/18/23  Yes Rojelio Nest, DO  insulin  degludec (TRESIBA  FLEXTOUCH) 100 UNIT/ML FlexTouch Pen inject 10 units every evening Patient taking differently: Inject 10-14 Units into the skin every evening. Inject 10-14 units with evening meal depending on blood sugar. 12/11/21  Yes   levETIRAcetam  (KEPPRA ) 500 MG tablet Take 1 tablet (500 mg total) by mouth 2 (two) times daily. 04/02/23  Yes Almarie Waddell [name], NP  metolazone  (ZAROXOLYN ) 5 MG tablet Take 1 tablet (5 mg total) by mouth daily. 07/18/23  Yes Rojelio Nest, DO  ondansetron (ZOFRAN-ODT) 4 MG disintegrating tablet Take 4 mg by mouth every 8 (eight) hours as needed. 10/08/23  Yes [provider]  torsemide  (DEMADEX ) 100 MG tablet Take 100 mg by mouth 2 (two) times daily.   Yes [provider]  traMADol (ULTRAM) 50 MG tablet Take 50 mg by mouth 2 (two) times daily as needed. 10/08/23  Yes [provider]  Insulin  Pen Needle (BD PEN NEEDLE NANO U/F) 32G X 4 MM MISC for insulin  injections qdDx: E11.65 on insulin  06/08/21     torsemide  (DEMADEX ) 100 MG tablet Take 1 tablet (100 mg total) by mouth 2 (two) times daily. 07/18/23 10/16/23  Rojelio Nest, DO     Family History  Problem Relation Age of Onset   Diabetes Mother    Hypertension Mother     Social History   Socioeconomic History   Marital status: Single    Spouse name: Not  on file   Number of children: Not on file   Years of education: Not on file   Highest education level: Not on file  Occupational History   Not on file  Tobacco Use   Smoking status: Never   Smokeless tobacco: Never  Vaping Use   Vaping status: Never Used  Substance and Sexual Activity   Alcohol use: Never   Drug use: Never   Sexual activity: Not Currently  Other Topics Concern   Not on file  Social History Narrative   Not on file   Social Drivers of Health    Financial Resource Strain: Not on file  Food Insecurity: No Food Insecurity (07/12/2023)   Hunger Vital Sign    Worried About Running Out of Food in the Last Year: Never true    Ran Out of Food in the Last Year: Never true  Transportation Needs: Patient Declined (07/12/2023)   PRAPARE - Administrator, Civil Service (Medical): Patient declined    Lack of Transportation (Non-Medical): Patient declined  Physical Activity: Not on file  Stress: Not on file  Social Connections: Not on file    Review of Systems: A 12 point ROS discussed and pertinent positives are indicated in the HPI above.  All other systems are negative.   Vital Signs: BP 122/60   Pulse 66   Temp 97.9 F (36.6 C) (Oral)   Resp 10   Ht 5' (1.524 m)   Wt 135 lb (61.2 kg)   SpO2 99%   BMI 26.37 kg/m     Physical Exam  Imaging: CT ABDOMEN PELVIS WO CONTRAST Result Date: 10/21/2023 EXAM: CT ABDOMEN AND PELVIS WITHOUT CONTRAST 10/21/2023 07:24:58 PM TECHNIQUE: CT of the abdomen and pelvis was performed without the administration of intravenous contrast. Multiplanar reformatted images are provided for review. Automated exposure control, iterative reconstruction, and/or weight-based adjustment of the mA/kV was utilized to reduce the radiation dose to as low as reasonably achievable. COMPARISON: CT abdomen and pelvis 07/11/2023. CLINICAL HISTORY: Abdominal pain, acute, nonlocalized. Patient has had on and off dizziness episodes for over a month. The worst episode is today. Feels weak. Has been vomiting with diarrhea. Abdominal pain, acute, nonlocalized. Patient has had on and off dizziness episodes for over a month. The worst episode is today. Feels weak. Has been vomiting with diarrhea. FINDINGS: LOWER CHEST: Scattered opacities in the right lung base. No pleural effusion. LIVER: The liver is unremarkable. GALLBLADDER AND BILE DUCTS: Gallbladder is unremarkable. No biliary ductal dilatation. SPLEEN: No acute  abnormality. PANCREAS: No acute abnormality. ADRENAL GLANDS: Indeterminate right adrenal nodule measuring 16 mm. Left adrenal gland within normal limits. KIDNEYS, URETERS AND BLADDER: Mild right renal atrophy, unchanged. Bilateral renal cysts appear similar to prior. There is a punctate nonobstructing left renal calculus. No hydronephrosis. No perinephric or periureteral stranding. Urinary bladder is unremarkable. Per consensus, no follow-up is needed for simple Bosniak type 1 and 2 renal cysts, unless the patient has a malignancy history or risk factors. GI AND BOWEL: Stomach demonstrates no acute abnormality. The appendix appears within normal limits. Appendicoliths are present. There is no bowel obstruction. PERITONEUM AND RETROPERITONEUM: No ascites. No free air. VASCULATURE: Atherosclerotic calcifications of the aorta and branch vessels. Peripheral vascular calcifications are present. LYMPH NODES: No lymphadenopathy. REPRODUCTIVE ORGANS: No acute abnormality. BONES AND SOFT TISSUES: No acute osseous abnormality. Wide mouth umbilical hernia present, small in size. IMPRESSION: 1. No acute findings in the abdomen or pelvis 2. Indeterminate 16 mm right adrenal  nodule; recommend nonemergent adrenal protocol CT or MRI for characterization 3. Punctate nonobstructing left renal calculus without hydronephrosis 4. Right basilar airspace opacities fracture / inflammatory. Electronically signed by: Greig Pique MD 10/21/2023 07:47 PM EDT RP Workstation: [id number]   MR BRAIN WO CONTRAST Result Date: 10/21/2023 EXAM: MR Brain without Intravenous Contrast. CLINICAL HISTORY: Neuro deficit, acute, stroke suspected. TECHNIQUE: Magnetic resonance images of the brain without intravenous contrast in multiple planes. CONTRAST: Without. COMPARISON: CT head 01/01/2023 and MRI head 09/02/2020. FINDINGS: BRAIN: No restricted diffusion to indicate acute infarction. No intracranial mass or hemorrhage. No midline shift or extra-axial  fluid collection. No cerebellar tonsillar ectopia. The central arterial and venous flow voids are patent. Postsurgical changes of left occipital craniotomy with similar appearance of underlying encephalomalacia. There is asymmetric volume loss of the left hippocampus. Susceptibility in the left occipital lobe which may reflect postsurgical changes. VENTRICLES: No hydrocephalus. ORBITS: Right lens replacement. SINUSES AND MASTOIDS: The sinuses and mastoid air cells are clear. BONES: Postsurgical changes of left occipital craniotomy. IMPRESSION: 1. No acute findings. 2. Postsurgical changes of left occipital craniotomy with underlying encephalomalacia. 3. Asymmetric volume loss of the left hippocampus. Electronically signed by: Donnice Mania MD 10/21/2023 06:48 PM EDT RP Workstation: [id number]   DG Chest Port 1 View Result Date: 10/21/2023 CLINICAL DATA:  Intermittent dizziness.  Vomiting and diarrhea. EXAM: PORTABLE CHEST 1 VIEW COMPARISON:  07/24/2023. FINDINGS: Trachea is midline. Heart is at the upper limits of normal in size to mildly enlarged. Lungs are clear. No pleural fluid. IMPRESSION: No acute findings. Electronically Signed   By: Newell Eke M.D.   On: 10/21/2023 17:07    Labs:  CBC: Recent Labs    07/17/23 0416 07/24/23 1011 10/21/23 1652 10/22/23 0755  WBC 7.0 6.9 8.7 7.6  HGB 7.7* 9.0* 7.5* 6.6*  HCT 24.0* 30.1* 23.6* 20.6*  PLT 248 272 209 209    COAGS: No results for input(s): INR, APTT in the last 8760 hours.  BMP: Recent Labs    07/18/23 0434 07/24/23 1011 10/21/23 1652 10/22/23 0755  NA 132* 137 125* 131*  K 3.7 3.4* 3.3* 2.8*  CL 96* 94* 70* 76*  CO2 23 24 29  34*  GLUCOSE 173* 68* 282* 57*  BUN 103* 110* 233* 230*  CALCIUM  7.8* 7.4* 7.7* 7.7*  CREATININE 11.41* 12.32* 14.00* 15.10*  GFRNONAA 3* 3* 3* 2*    LIVER FUNCTION TESTS: Recent Labs    01/01/23 1617 04/02/23 1032 07/11/23 1625 07/13/23 0257 07/15/23 0412 07/16/23 0540 07/17/23 0416  10/21/23 1652  BILITOT 0.7 0.4 0.2  --   --   --   --  0.4  AST 25 14 24   --   --   --   --  23  ALT 17 13 32  --   --   --   --  29  ALKPHOS 107 93 79  --   --   --   --  118  PROT 6.8 6.5 6.2*  --   --   --   --  7.3  ALBUMIN 3.2* 3.8 3.3*   < > 2.1* 2.1* 2.2* 3.6   < > = values in this interval not displayed.    TUMOR MARKERS: No results for input(s): AFPTM, CEA, CA199, CHROMGRNA in the last 8760 hours.   Assessment and Plan: Request for image guided tunneled dialysis catheter -ESRD-pt previously declined dialysis, but now wants to proceed with initiation of dialysis after speaking with palliative care and needs access -  Transferred to [facility] from [facility] ED to obtain HD access  -Plan to initiate dialysis on Wednesday 10/23/2023   Received request for Interventional Radiology to place tunneled dialysis catheter.  No contraindications for procedure identified in ROS, physical exam, or review of pre-sedation considerations. *** Labs reviewed and within acceptable range *** Imaging available and reviewed by Dr Hughes*** *** VSS, afebrile *** Patient does not take a blood thinner *** Patient not asked to hold any AC/AP for this low bleeding risk procedure Abx not indicated   Risks and benefits discussed with the patient including, but not limited to bleeding, infection, vascular injury, pneumothorax which may require chest tube placement, air embolism or even death  All of the patient's questions were answered, patient is agreeable to proceed. Consent signed and in chart.    ***  Thank you for allowing our service to participate in Jill Jefferson 's care.    Electronically Signed: Lacye Mccarn B Heidemarie Goodnow, NP   10/22/2023, 4:20 PM     I spent a total of {New PWEU:[id number]} in face to face in clinical consultation, greater than 50% of which was counseling/coordinating care for tunneled dialysis catheter placement.   (A copy of this note was sent to the referring provider  and the time of visit.)

## 2023-10-22 NOTE — ED Notes (Signed)
 Blood bank has blood ready for this patient.  Notified Lauren, EMT-P at 10:37am

## 2023-10-22 NOTE — ED Notes (Addendum)
 On AM CBG check, she was noted to be hypoglycemic at 57. She was a/o and able to protect her own airway, so 8 oz orange juice was given. Pt tolerated well.

## 2023-10-22 NOTE — Progress Notes (Addendum)
 [facility] PROGRESS NOTE    Progress Note  Jill Jefferson  FMW:[id number] DOB: July 26, 1960 DOA: 10/21/2023 PCP: Shelda Atlas, MD     Brief Narrative:   Jill Jefferson is an 63 y.o. female past medical history significant for chronic kidney disease stage V, seizure disorder, HFrEF with a last EF of 40%, chronic kidney disease stage V, insulin -dependent diabetes mellitus type 2 recently discharged from the hospital on October 2025 for heart failure exacerbation and acute kidney injury, during that time she was hesitant to start hemodialysis, she was discharged on high-dose diuretic therapy comes in for dizziness generalized progressive weakness nausea vomiting and intermittent chest tightness   Assessment/Plan:   Mild uremia/ CKD (chronic kidney disease) stage 5, GFR less than 15 ml/min (HCC)/hypokalemia: Do not replace potassium. Will need to start hemodialysis, if she agrees to it. Transfer to [facility]. Nephrology consulted. I had a long conversation with the patient and the daughter they are still indecisive.   Anemia of chronic renal disease/microcytic anemia: She relates no signs of overt bleeding. Hemoglobin came back at 6.6, will transfuse 1 unit of packed red blood cell check CBC posttransfusion. Check an anemia panel.  Elevated lipase: Denies any abdominal pain CT scan showed no acute findings. Continue antiemetics.  Elevated troponins: In the setting of chronic renal disease Twelve-lead EKG showed no evidence of ischemia.  Incidental adrenal nodule: CT scan of the abdomen pelvis showed 16mm right adrenal nodule. Will need further imaging for characterization.  Essential hypertension: Continue Coreg  holding diuretics. Use hydralazine  as needed.  Seizure disorder: Continue Keppra .  Chronic HFrEF: Last EF showed a 2D echo 40%. Chest x-ray showed no pulmonary edema.  Insulin -dependent diabetes mellitus type 2: Last A1c of 7.1. Continue  long-acting insulin  plus sliding scale.    DVT prophylaxis: lovenox  Family Communication:daughter Status is: Inpatient Remains inpatient appropriate because: CKD V    Code Status:     Code Status Orders  (From admission, onward)           Start     Ordered   10/21/23 2254  Full code  Continuous       Question:  By:  Answer:  Consent: discussion documented in EHR   10/21/23 2255           Code Status History     Date Active Date Inactive Code Status Order ID Comments User Context   07/12/2023 1559 07/18/2023 1649 Full Code [id number]  Seena Marsa [name], MD Inpatient   05/10/2021 2124 05/14/2021 0106 Full Code [id number]  Marcene Eva [name], DO ED   09/01/2020 2043 09/08/2020 0017 Full Code [id number]  Seena Marsa [name], MD ED         IV Access:   Peripheral IV   Procedures and diagnostic studies:   CT ABDOMEN PELVIS WO CONTRAST Result Date: 10/21/2023 EXAM: CT ABDOMEN AND PELVIS WITHOUT CONTRAST 10/21/2023 07:24:58 PM TECHNIQUE: CT of the abdomen and pelvis was performed without the administration of intravenous contrast. Multiplanar reformatted images are provided for review. Automated exposure control, iterative reconstruction, and/or weight-based adjustment of the mA/kV was utilized to reduce the radiation dose to as low as reasonably achievable. COMPARISON: CT abdomen and pelvis 07/11/2023. CLINICAL HISTORY: Abdominal pain, acute, nonlocalized. Patient has had on and off dizziness episodes for over a month. The worst episode is today. Feels weak. Has been vomiting with diarrhea. Abdominal pain, acute, nonlocalized. Patient has had on and off dizziness episodes for over a month. The worst episode is today. Feels weak.  Has been vomiting with diarrhea. FINDINGS: LOWER CHEST: Scattered opacities in the right lung base. No pleural effusion. LIVER: The liver is unremarkable. GALLBLADDER AND BILE DUCTS: Gallbladder is unremarkable. No biliary ductal dilatation. SPLEEN: No  acute abnormality. PANCREAS: No acute abnormality. ADRENAL GLANDS: Indeterminate right adrenal nodule measuring 16 mm. Left adrenal gland within normal limits. KIDNEYS, URETERS AND BLADDER: Mild right renal atrophy, unchanged. Bilateral renal cysts appear similar to prior. There is a punctate nonobstructing left renal calculus. No hydronephrosis. No perinephric or periureteral stranding. Urinary bladder is unremarkable. Per consensus, no follow-up is needed for simple Bosniak type 1 and 2 renal cysts, unless the patient has a malignancy history or risk factors. GI AND BOWEL: Stomach demonstrates no acute abnormality. The appendix appears within normal limits. Appendicoliths are present. There is no bowel obstruction. PERITONEUM AND RETROPERITONEUM: No ascites. No free air. VASCULATURE: Atherosclerotic calcifications of the aorta and branch vessels. Peripheral vascular calcifications are present. LYMPH NODES: No lymphadenopathy. REPRODUCTIVE ORGANS: No acute abnormality. BONES AND SOFT TISSUES: No acute osseous abnormality. Wide mouth umbilical hernia present, small in size. IMPRESSION: 1. No acute findings in the abdomen or pelvis 2. Indeterminate 16 mm right adrenal nodule; recommend nonemergent adrenal protocol CT or MRI for characterization 3. Punctate nonobstructing left renal calculus without hydronephrosis 4. Right basilar airspace opacities fracture / inflammatory. Electronically signed by: Greig Pique MD 10/21/2023 07:47 PM EDT RP Workstation: [id number]   MR BRAIN WO CONTRAST Result Date: 10/21/2023 EXAM: MR Brain without Intravenous Contrast. CLINICAL HISTORY: Neuro deficit, acute, stroke suspected. TECHNIQUE: Magnetic resonance images of the brain without intravenous contrast in multiple planes. CONTRAST: Without. COMPARISON: CT head 01/01/2023 and MRI head 09/02/2020. FINDINGS: BRAIN: No restricted diffusion to indicate acute infarction. No intracranial mass or hemorrhage. No midline shift or  extra-axial fluid collection. No cerebellar tonsillar ectopia. The central arterial and venous flow voids are patent. Postsurgical changes of left occipital craniotomy with similar appearance of underlying encephalomalacia. There is asymmetric volume loss of the left hippocampus. Susceptibility in the left occipital lobe which may reflect postsurgical changes. VENTRICLES: No hydrocephalus. ORBITS: Right lens replacement. SINUSES AND MASTOIDS: The sinuses and mastoid air cells are clear. BONES: Postsurgical changes of left occipital craniotomy. IMPRESSION: 1. No acute findings. 2. Postsurgical changes of left occipital craniotomy with underlying encephalomalacia. 3. Asymmetric volume loss of the left hippocampus. Electronically signed by: Donnice Mania MD 10/21/2023 06:48 PM EDT RP Workstation: [id number]   DG Chest Port 1 View Result Date: 10/21/2023 CLINICAL DATA:  Intermittent dizziness.  Vomiting and diarrhea. EXAM: PORTABLE CHEST 1 VIEW COMPARISON:  07/24/2023. FINDINGS: Trachea is midline. Heart is at the upper limits of normal in size to mildly enlarged. Lungs are clear. No pleural fluid. IMPRESSION: No acute findings. Electronically Signed   By: Newell Eke M.D.   On: 10/21/2023 17:07     Medical Consultants:   None.   Subjective:    Doreather Haymond patient relates she is not sure whether to start dialysis or not.  Objective:    Vitals:   10/22/23 0330 10/22/23 0400 10/22/23 0430 10/22/23 0500  BP: (!) 135/53 (!) 111/50 (!) 110/51 (!) 125/49  Pulse: 65 65 66 65  Resp:    18  Temp:      TempSrc:      SpO2: 100% 98% 99% 96%  Weight:      Height:       SpO2: 96 %  No intake or output data in the 24 hours ending 10/22/23 [employer]  10/21/23 1531  Weight: 61.2 kg    Exam: General exam: In no acute distress. Respiratory system: Good air movement and clear to auscultation. Cardiovascular system: S1 & S2 heard, RRR.  Positive JVD Gastrointestinal system:  Abdomen is nondistended, soft and nontender.  Extremities: No pedal edema. Skin: No rashes, lesions or ulcers Psychiatry: Judgement and insight appear normal. Mood & affect appropriate.    Data Reviewed:    Labs: Basic Metabolic Panel: Recent Labs  Lab 10/21/23 1652  NA 125*  K 3.3*  CL 70*  CO2 29  GLUCOSE 282*  BUN 233*  CREATININE 14.00*  CALCIUM  7.7*   GFR Estimated Creatinine Clearance: 3.5 mL/min (A) (by C-G formula based on SCr of 14 mg/dL (H)). Liver Function Tests: Recent Labs  Lab 10/21/23 1652  AST 23  ALT 29  ALKPHOS 118  BILITOT 0.4  PROT 7.3  ALBUMIN 3.6   Recent Labs  Lab 10/21/23 1652  LIPASE 463*   No results for input(s): AMMONIA in the last 168 hours. Coagulation profile No results for input(s): INR, PROTIME in the last 168 hours. COVID-19 Labs  Recent Labs    10/21/23 2146  FERRITIN 897*    Lab Results  Component Value Date   SARSCOV2NAA NEGATIVE 07/11/2023   SARSCOV2NAA NEGATIVE 09/01/2020    CBC: Recent Labs  Lab 10/21/23 1652  WBC 8.7  HGB 7.5*  HCT 23.6*  MCV 78.9*  PLT 209   Cardiac Enzymes: No results for input(s): CKTOTAL, CKMB, CKMBINDEX, TROPONINI in the last 168 hours. BNP (last 3 results) Recent Labs    07/11/23 1625  PROBNP 33,608.0*   CBG: Recent Labs  Lab 10/21/23 2358 10/22/23 0422  GLUCAP 160* 109*   D-Dimer: No results for input(s): DDIMER in the last 72 hours. Hgb A1c: No results for input(s): HGBA1C in the last 72 hours. Lipid Profile: No results for input(s): CHOL, HDL, LDLCALC, TRIG, CHOLHDL, LDLDIRECT in the last 72 hours. Thyroid  function studies: No results for input(s): TSH, T4TOTAL, T3FREE, THYROIDAB in the last 72 hours.  Invalid input(s): FREET3 Anemia work up: Recent Labs    10/21/23 2146  FERRITIN 897*  TIBC 260  IRON  34   Sepsis Labs: Recent Labs  Lab 10/21/23 1652  WBC 8.7   Microbiology No results found for this or any  previous visit (from the past 240 hours).   Medications:    carvedilol   6.25 mg Oral BID WC   insulin  aspart  0-6 Units Subcutaneous Q4H   insulin  glargine  10 Units Subcutaneous Q2200   levETIRAcetam   500 mg Oral BID   Continuous Infusions:    LOS: 1 day   Erle Odell Castor  [facility]  10/22/2023, 6:25 AM

## 2023-10-22 NOTE — ED Notes (Signed)
 Pt noted to be hypoglycemic at 52. MD contacted as pt is NPO. He ordered orange juice. Same was given. Pt was a/o and able to protect her own airway. She was able to drink the juice without assistance and no negative effects were noted. Will continue to monitor.

## 2023-10-22 NOTE — ED Notes (Signed)
 Report called and given to Clotilda FURY RN at [facility]. [facility] will be called when room is in a clean status.

## 2023-10-22 NOTE — Progress Notes (Signed)
 Request seen for [facility]. Per Dr. Geralynn, intent is to begin HD Wed. Placed NPO at [facility] tonight in preparation for likely [facility] tomorrow pending final approval and exam once on [facility] (currently en route to [facility] from [facility] ED).   Rodman Recupero NP 10/22/2023 4:25 PM

## 2023-10-23 ENCOUNTER — Inpatient Hospital Stay (HOSPITAL_COMMUNITY)

## 2023-10-23 DIAGNOSIS — N185 Chronic kidney disease, stage 5: Secondary | ICD-10-CM | POA: Diagnosis not present

## 2023-10-23 HISTORY — PX: IR TUNNELED CENTRAL VENOUS CATH PLC W IMG: IMG1939

## 2023-10-23 LAB — BPAM RBC
Blood Product Expiration Date: 202511052359
ISSUE DATE / TIME: 202510141046
Unit Type and Rh: 7300

## 2023-10-23 LAB — COMPREHENSIVE METABOLIC PANEL WITH GFR
ALT: 21 U/L (ref 0–44)
AST: 18 U/L (ref 15–41)
Albumin: 2.3 g/dL — ABNORMAL LOW (ref 3.5–5.0)
Alkaline Phosphatase: 69 U/L (ref 38–126)
Anion gap: 23 — ABNORMAL HIGH (ref 5–15)
BUN: 244 mg/dL — ABNORMAL HIGH (ref 8–23)
CO2: 31 mmol/L (ref 22–32)
Calcium: 7.3 mg/dL — ABNORMAL LOW (ref 8.9–10.3)
Chloride: 73 mmol/L — ABNORMAL LOW (ref 98–111)
Creatinine, Ser: 14.69 mg/dL — ABNORMAL HIGH (ref 0.44–1.00)
GFR, Estimated: 3 mL/min — ABNORMAL LOW (ref >60)
Glucose, Bld: 229 mg/dL — ABNORMAL HIGH (ref 70–99)
Potassium: 3.5 mmol/L (ref 3.5–5.1)
Sodium: 127 mmol/L — ABNORMAL LOW (ref 135–145)
Total Bilirubin: 1 mg/dL (ref 0.0–1.2)
Total Protein: 6.4 g/dL — ABNORMAL LOW (ref 6.5–8.1)

## 2023-10-23 LAB — HEMOGLOBIN A1C
Hgb A1c MFr Bld: 9.1 % — ABNORMAL HIGH (ref 4.8–5.6)
Mean Plasma Glucose: 214 mg/dL

## 2023-10-23 LAB — TYPE AND SCREEN
ABO/RH(D): B POS
Antibody Screen: NEGATIVE
Unit division: 0

## 2023-10-23 LAB — GLUCOSE, CAPILLARY
Glucose-Capillary: 104 mg/dL — ABNORMAL HIGH (ref 70–99)
Glucose-Capillary: 116 mg/dL — ABNORMAL HIGH (ref 70–99)
Glucose-Capillary: 118 mg/dL — ABNORMAL HIGH (ref 70–99)
Glucose-Capillary: 126 mg/dL — ABNORMAL HIGH (ref 70–99)
Glucose-Capillary: 134 mg/dL — ABNORMAL HIGH (ref 70–99)
Glucose-Capillary: 147 mg/dL — ABNORMAL HIGH (ref 70–99)
Glucose-Capillary: 203 mg/dL — ABNORMAL HIGH (ref 70–99)
Glucose-Capillary: 82 mg/dL (ref 70–99)

## 2023-10-23 LAB — HEPATITIS B SURFACE ANTIGEN: Hepatitis B Surface Ag: NONREACTIVE

## 2023-10-23 LAB — MAGNESIUM: Magnesium: 2.2 mg/dL (ref 1.7–2.4)

## 2023-10-23 MED ORDER — TRAMADOL HCL 50 MG PO TABS
50.0000 mg | ORAL_TABLET | Freq: Two times a day (BID) | ORAL | Status: DC | PRN
  Administered 2023-10-23 – 2023-10-24 (×3): 50 mg via ORAL
  Filled 2023-10-23 (×3): qty 1

## 2023-10-23 MED ORDER — ACETAMINOPHEN 500 MG PO TABS
1000.0000 mg | ORAL_TABLET | Freq: Four times a day (QID) | ORAL | Status: DC | PRN
  Administered 2023-10-23 (×2): 1000 mg via ORAL
  Filled 2023-10-23 (×3): qty 2

## 2023-10-23 MED ORDER — HEPARIN SODIUM (PORCINE) 1000 UNIT/ML DIALYSIS: 1500.0000 [IU] | INTRAMUSCULAR | Status: DC | PRN

## 2023-10-23 MED ORDER — CEFAZOLIN SODIUM-DEXTROSE 2-4 GM/100ML-% IV SOLN
2.0000 g | INTRAVENOUS | Status: AC
Start: 2023-10-23 — End: 2023-10-24

## 2023-10-23 MED ORDER — TIZANIDINE HCL 2 MG PO TABS
1.0000 mg | ORAL_TABLET | Freq: Every evening | ORAL | Status: DC | PRN
  Administered 2023-10-23 – 2023-10-29 (×4): 1 mg via ORAL
  Filled 2023-10-23 (×5): qty 0.5

## 2023-10-23 MED ORDER — ANTICOAGULANT SODIUM CITRATE 4% (200MG/5ML) IV SOLN: 5.0000 mL | Status: DC | PRN

## 2023-10-23 MED ORDER — ALTEPLASE 2 MG IJ SOLR: 2.0000 mg | Freq: Once | INTRAMUSCULAR | Status: DC | PRN

## 2023-10-23 MED ORDER — FENTANYL CITRATE (PF) 100 MCG/2ML IJ SOLN
INTRAMUSCULAR | Status: AC
  Filled 2023-10-23: qty 2

## 2023-10-23 MED ORDER — HEPARIN SODIUM (PORCINE) 1000 UNIT/ML IJ SOLN
INTRAMUSCULAR | Status: AC
Start: 2023-10-23 — End: 2023-10-23
  Filled 2023-10-23: qty 8

## 2023-10-23 MED ORDER — NEPRO/CARBSTEADY PO LIQD: 237.0000 mL | ORAL | Status: DC | PRN

## 2023-10-23 MED ORDER — MIDAZOLAM HCL 2 MG/2ML IJ SOLN
INTRAMUSCULAR | Status: AC | PRN
  Administered 2023-10-23 (×2): .5 mg via INTRAVENOUS

## 2023-10-23 MED ORDER — LIDOCAINE-EPINEPHRINE 1 %-1:100000 IJ SOLN
20.0000 mL | Freq: Once | INTRAMUSCULAR | Status: DC
Start: 2023-10-23 — End: 2023-10-28

## 2023-10-23 MED ORDER — HEPARIN SODIUM (PORCINE) 1000 UNIT/ML IJ SOLN
10.0000 mL | Freq: Once | INTRAMUSCULAR | Status: AC
  Administered 2023-10-25: 3200 [IU]

## 2023-10-23 MED ORDER — LIDOCAINE HCL (PF) 1 % IJ SOLN: 5.0000 mL | INTRAMUSCULAR | Status: DC | PRN

## 2023-10-23 MED ORDER — HEPARIN SODIUM (PORCINE) 1000 UNIT/ML DIALYSIS: 1500.0000 [IU] | Freq: Once | INTRAMUSCULAR | Status: DC

## 2023-10-23 MED ORDER — MIDAZOLAM HCL 2 MG/2ML IJ SOLN
INTRAMUSCULAR | Status: AC
  Filled 2023-10-23: qty 2

## 2023-10-23 MED ORDER — LIDOCAINE-PRILOCAINE 2.5-2.5 % EX CREA: 1.0000 | TOPICAL_CREAM | CUTANEOUS | Status: DC | PRN

## 2023-10-23 MED ORDER — HEPARIN SODIUM (PORCINE) 1000 UNIT/ML DIALYSIS: 1000.0000 [IU] | INTRAMUSCULAR | Status: DC | PRN

## 2023-10-23 MED ORDER — CEFAZOLIN SODIUM-DEXTROSE 2-4 GM/100ML-% IV SOLN
INTRAVENOUS | Status: AC
  Filled 2023-10-23: qty 100

## 2023-10-23 MED ORDER — PENTAFLUOROPROP-TETRAFLUOROETH EX AERO: 1.0000 | INHALATION_SPRAY | CUTANEOUS | Status: DC | PRN

## 2023-10-23 MED ORDER — LIDOCAINE-EPINEPHRINE 1 %-1:100000 IJ SOLN
INTRAMUSCULAR | Status: AC
  Filled 2023-10-23: qty 1

## 2023-10-23 MED ORDER — CEFAZOLIN SODIUM-DEXTROSE 2-4 GM/100ML-% IV SOLN
INTRAVENOUS | Status: AC | PRN
  Administered 2023-10-23: 2 g via INTRAVENOUS

## 2023-10-23 MED ORDER — FENTANYL CITRATE (PF) 100 MCG/2ML IJ SOLN
INTRAMUSCULAR | Status: AC | PRN
  Administered 2023-10-23 (×2): 25 ug via INTRAVENOUS

## 2023-10-23 MED ORDER — HEPARIN SODIUM (PORCINE) 1000 UNIT/ML IJ SOLN
INTRAMUSCULAR | Status: AC
  Filled 2023-10-23: qty 10

## 2023-10-23 NOTE — Progress Notes (Signed)
 PT Cancellation Note  Patient Details Name: Jill Jefferson MRN: [id number] DOB: 10/18/1960   Cancelled Treatment:    Reason Eval/Treat Not Completed: Patient at procedure or test/unavailable (Per RN, pt at IR currently and likely going to dialysis afterwards. Will follow up later if time allows.)   Justyn Boyson 10/23/2023, 10:10 AM

## 2023-10-23 NOTE — Procedures (Signed)
 Vascular and Interventional Radiology Procedure Note  Patient: Jill Jefferson DOB: Nov 25, 1960 Medical Record Number: [id number] Note Date/Time: 10/23/23 10:30 AM   Performing Physician: Thom Hall, MD Assistant(s): None  Diagnosis: ESRD requiring Hemodialysis  Procedure: TUNNELED HEMODIALYSIS CATHETER PLACEMENT  Anesthesia: Conscious Sedation Complications: None Estimated Blood Loss: Minimal Specimens:  None  Findings:  Successful placement of right-sided, 19 cm (tip-to-cuff), tunneled hemodialysis catheter with the tip of the catheter in the proximal right atrium.  Plan: Catheter ready for use.  See detailed procedure note with images in PACS. The patient tolerated the procedure well without incident or complication and was returned to Recovery in stable condition.    Thom Hall, MD [facility] Radiology   Pager. [phone] Clinic. [phone]

## 2023-10-23 NOTE — Progress Notes (Signed)
 Patient ID: Jill Jefferson, female   DOB: 07-03-1960, 63 y.o.   MRN: [id number] S: Seen in her room with daughter at bedside.  Had RIJ TDC placed.  Did not eat lunch, not hungry.  O:BP 136/70 (BP Location: Right Arm)   Pulse 80   Temp 97.9 F (36.6 C) (Oral)   Resp 11   Ht 5' (1.524 m)   Wt 61.2 kg   SpO2 99%   BMI 26.37 kg/m   Intake/Output Summary (Last 24 hours) at 10/23/2023 1415 Last data filed at 10/23/2023 0643 Gross per 24 hour  Intake --  Output 300 ml  Net -300 ml   Intake/Output: I/O last 3 completed shifts: In: 304 [Blood:304] Out: 300 [Urine:300]  Intake/Output this shift:  No intake/output data recorded. Weight change:  Gen: NAD CVS: RRR Resp:CTA Abd: +BS, soft, NT/ND Ext: no edema  Recent Labs  Lab 10/21/23 1652 10/22/23 0755 10/22/23 2327  NA 125* 131* 127*  K 3.3* 2.8* 3.5  CL 70* 76* 73*  CO2 29 34* 31  GLUCOSE 282* 57* 229*  BUN 233* 230* 244*  CREATININE 14.00* 15.10* 14.69*  ALBUMIN 3.6  --  2.3*  CALCIUM  7.7* 7.7* 7.3*  AST 23  --  18  ALT 29  --  21   Liver Function Tests: Recent Labs  Lab 10/21/23 1652 10/22/23 2327  AST 23 18  ALT 29 21  ALKPHOS 118 69  BILITOT 0.4 1.0  PROT 7.3 6.4*  ALBUMIN 3.6 2.3*   Recent Labs  Lab 10/21/23 1652  LIPASE 463*   No results for input(s): AMMONIA in the last 168 hours. CBC: Recent Labs  Lab 10/21/23 1652 10/22/23 0755 10/22/23 2327  WBC 8.7 7.6 8.7  NEUTROABS  --   --  7.0  HGB 7.5* 6.6* 8.7*  HCT 23.6* 20.6* 25.5*  MCV 78.9* 78.6* 76.6*  PLT 209 209 199   Cardiac Enzymes: No results for input(s): CKTOTAL, CKMB, CKMBINDEX, TROPONINI in the last 168 hours. CBG: Recent Labs  Lab 10/22/23 2002 10/23/23 0009 10/23/23 0256 10/23/23 0734 10/23/23 1147  GLUCAP 221* 203* 147* 104* 118*    Iron  Studies:  Recent Labs    10/21/23 2146  IRON  34  TIBC 260  FERRITIN 897*   Studies/Results: IR TUNNELED CENTRAL VENOUS CATH PLC W IMG Result Date:  10/23/2023 INDICATION: ESRD requiring HD initiation. EXAM: TUNNELED CENTRAL VENOUS HEMODIALYSIS CATHETER PLACEMENT WITH ULTRASOUND AND FLUOROSCOPIC GUIDANCE MEDICATIONS: Ancef 2 gm IV. The antibiotic was given in an appropriate time interval prior to skin puncture. ANESTHESIA/SEDATION: Moderate (conscious) sedation was employed during this procedure. A total of Versed  1 mg and Fentanyl 50 mcg was administered intravenously. Moderate Sedation Time: 15 minutes. The patient's level of consciousness and vital signs were monitored continuously by radiology nursing throughout the procedure under my direct supervision. FLUOROSCOPY: Radiation Exposure Index and estimated peak skin dose (PSD); Reference air kerma (RAK), 0.1 mGy. COMPLICATIONS: None immediate. PROCEDURE: Informed written consent was obtained from the patient after a discussion of the risks, benefits, and alternatives to treatment. Questions regarding the procedure were encouraged and answered. The RIGHT neck and chest were prepped with chlorhexidine  in a sterile fashion, and a sterile drape was applied covering the operative field. Maximum barrier sterile technique with sterile gowns and gloves were used for the procedure. A timeout was performed prior to the initiation of the procedure. After creating a small venotomy incision, a micropuncture kit was utilized to access the internal jugular vein. Real-time ultrasound guidance was  utilized for vascular access including the acquisition of a permanent ultrasound image documenting patency of the accessed vessel. The microwire was utilized to measure appropriate catheter length. A stiff Glidewire was advanced to the level of the IVC and the micropuncture sheath was exchanged for a peel-away sheath. A palindrome tunneled hemodialysis catheter measuring 19 cm from tip to cuff was tunneled in a retrograde fashion from the anterior chest wall to the venotomy incision. The catheter was then placed through the  peel-away sheath with tips ultimately positioned within the superior aspect of the right atrium. Final catheter positioning was confirmed and documented with a spot radiographic image. The catheter aspirates and flushes normally. The catheter was flushed with appropriate volume heparin  dwells. The catheter exit site was secured with a 2-0 Ethilon retention suture. The venotomy incision was closed with Dermabond. Dressings were applied. The patient tolerated the procedure well without immediate post procedural complication. IMPRESSION: Successful placement of 19 cm tip to cuff tunneled hemodialysis catheter via the RIGHT internal jugular vein. The tip of the catheter is positioned within the proximal RIGHT atrium. The catheter is ready for immediate use. Thom Hall, MD [facility] Radiology Electronically Signed   By: Thom Hall M.D.   On: 10/23/2023 14:05   CT ABDOMEN PELVIS WO CONTRAST Result Date: 10/21/2023 EXAM: CT ABDOMEN AND PELVIS WITHOUT CONTRAST 10/21/2023 07:24:58 PM TECHNIQUE: CT of the abdomen and pelvis was performed without the administration of intravenous contrast. Multiplanar reformatted images are provided for review. Automated exposure control, iterative reconstruction, and/or weight-based adjustment of the mA/kV was utilized to reduce the radiation dose to as low as reasonably achievable. COMPARISON: CT abdomen and pelvis 07/11/2023. CLINICAL HISTORY: Abdominal pain, acute, nonlocalized. Patient has had on and off dizziness episodes for over a month. The worst episode is today. Feels weak. Has been vomiting with diarrhea. Abdominal pain, acute, nonlocalized. Patient has had on and off dizziness episodes for over a month. The worst episode is today. Feels weak. Has been vomiting with diarrhea. FINDINGS: LOWER CHEST: Scattered opacities in the right lung base. No pleural effusion. LIVER: The liver is unremarkable. GALLBLADDER AND BILE DUCTS:  Gallbladder is unremarkable. No biliary ductal dilatation. SPLEEN: No acute abnormality. PANCREAS: No acute abnormality. ADRENAL GLANDS: Indeterminate right adrenal nodule measuring 16 mm. Left adrenal gland within normal limits. KIDNEYS, URETERS AND BLADDER: Mild right renal atrophy, unchanged. Bilateral renal cysts appear similar to prior. There is a punctate nonobstructing left renal calculus. No hydronephrosis. No perinephric or periureteral stranding. Urinary bladder is unremarkable. Per consensus, no follow-up is needed for simple Bosniak type 1 and 2 renal cysts, unless the patient has a malignancy history or risk factors. GI AND BOWEL: Stomach demonstrates no acute abnormality. The appendix appears within normal limits. Appendicoliths are present. There is no bowel obstruction. PERITONEUM AND RETROPERITONEUM: No ascites. No free air. VASCULATURE: Atherosclerotic calcifications of the aorta and branch vessels. Peripheral vascular calcifications are present. LYMPH NODES: No lymphadenopathy. REPRODUCTIVE ORGANS: No acute abnormality. BONES AND SOFT TISSUES: No acute osseous abnormality. Wide mouth umbilical hernia present, small in size. IMPRESSION: 1. No acute findings in the abdomen or pelvis 2. Indeterminate 16 mm right adrenal nodule; recommend nonemergent adrenal protocol CT or MRI for characterization 3. Punctate nonobstructing left renal calculus without hydronephrosis 4. Right basilar airspace opacities fracture / inflammatory. Electronically signed by: Greig Pique MD 10/21/2023 07:47 PM EDT RP Workstation: [id number]   MR BRAIN WO CONTRAST Result Date: 10/21/2023 EXAM: MR Brain without Intravenous Contrast.  CLINICAL HISTORY: Neuro deficit, acute, stroke suspected. TECHNIQUE: Magnetic resonance images of the brain without intravenous contrast in multiple planes. CONTRAST: Without. COMPARISON: CT head 01/01/2023 and MRI head 09/02/2020. FINDINGS: BRAIN: No restricted diffusion to indicate acute  infarction. No intracranial mass or hemorrhage. No midline shift or extra-axial fluid collection. No cerebellar tonsillar ectopia. The central arterial and venous flow voids are patent. Postsurgical changes of left occipital craniotomy with similar appearance of underlying encephalomalacia. There is asymmetric volume loss of the left hippocampus. Susceptibility in the left occipital lobe which may reflect postsurgical changes. VENTRICLES: No hydrocephalus. ORBITS: Right lens replacement. SINUSES AND MASTOIDS: The sinuses and mastoid air cells are clear. BONES: Postsurgical changes of left occipital craniotomy. IMPRESSION: 1. No acute findings. 2. Postsurgical changes of left occipital craniotomy with underlying encephalomalacia. 3. Asymmetric volume loss of the left hippocampus. Electronically signed by: Donnice Mania MD 10/21/2023 06:48 PM EDT RP Workstation: [id number]   DG Chest Port 1 View Result Date: 10/21/2023 CLINICAL DATA:  Intermittent dizziness.  Vomiting and diarrhea. EXAM: PORTABLE CHEST 1 VIEW COMPARISON:  07/24/2023. FINDINGS: Trachea is midline. Heart is at the upper limits of normal in size to mildly enlarged. Lungs are clear. No pleural fluid. IMPRESSION: No acute findings. Electronically Signed   By: Newell Eke M.D.   On: 10/21/2023 17:07    carvedilol   6.25 mg Oral BID WC   Chlorhexidine  Gluconate Cloth  6 each Topical Q0600   heparin  sodium (porcine)  10 mL Intracatheter Once   insulin  aspart  0-6 Units Subcutaneous Q4H   levETIRAcetam   500 mg Oral BID   lidocaine-EPINEPHrine  20 mL Infiltration Once    BMET    Component Value Date/Time   NA 127 (L) 10/22/2023 2327   NA 139 06/23/2019 1208   K 3.5 10/22/2023 2327   CL 73 (L) 10/22/2023 2327   CO2 31 10/22/2023 2327   GLUCOSE 229 (H) 10/22/2023 2327   BUN 244 (H) 10/22/2023 2327   BUN 27 (H) 06/23/2019 1208   CREATININE 14.69 (H) 10/22/2023 2327   CREATININE 3.48 (H) 06/08/2021 0000   CALCIUM  7.3 (L) 10/22/2023 2327    GFRNONAA 3 (L) 10/22/2023 2327   GFRAA 39 (L) 06/23/2019 1208   CBC    Component Value Date/Time   WBC 8.7 10/22/2023 2327   RBC 3.33 (L) 10/22/2023 2327   HGB 8.7 (L) 10/22/2023 2327   HGB 11.1 06/23/2019 1208   HCT 25.5 (L) 10/22/2023 2327   HCT 33.2 (L) 06/23/2019 1208   PLT 199 10/22/2023 2327   PLT 257 06/23/2019 1208   MCV 76.6 (L) 10/22/2023 2327   MCV 81 06/23/2019 1208   MCH 26.1 10/22/2023 2327   MCHC 34.1 10/22/2023 2327   RDW 14.9 10/22/2023 2327   RDW 12.4 06/23/2019 1208   LYMPHSABS 0.8 10/22/2023 2327   LYMPHSABS 1.8 06/23/2019 1208   MONOABS 0.7 10/22/2023 2327   EOSABS 0.1 10/22/2023 2327   EOSABS 0.1 06/23/2019 1208   BASOSABS 0.0 10/22/2023 2327   BASOSABS 0.0 06/23/2019 1208    Assessment 85F with historical CKD 5 now progressing to uremia, significant azotemia.   Uremia with malaise, neurological symptoms, GI symptoms and severe azotemia CKD 5, now reasonable to declare ESRD Microcytic anemia Mild hypokalemia Increased anion gap metabolic acidosis with metabolic alkalosis Seizure disorder  hypertension   Plan She is presenting with progressive uremia and this is the time to start dialysis.  At length discussed this with patient and daughter.  She finally decided  to proceed with dialysis and had RIJ TDC placed by [facility] today.  Plan to initiate short, slow HD daily and follow her response.  Will need to involve renal navigator to arrange for outpatient dialysis.   Will continue with ongoing education and discuss home modalities but daughter expresses some concerns about her suitability to do this.  This could all be sorted out and something like her transitional care unit if she does begin dialysis.  Extensive discussion with the patient regarding goals of care. Would not replace potassium Can give some gentle hydration in case she is hypovolemic with her poor oral intake and nausea/vomiting Check iron  levels, consider ESA based upon her clinical  objectives RIJ TDC placed by IR on 10/23/23  Fairy RONAL Sellar, MD [facility]

## 2023-10-23 NOTE — Plan of Care (Signed)

## 2023-10-23 NOTE — Progress Notes (Signed)
  Received patient in bed to unit.   Informed consent signed and in chart.    TX duration:2     Transported by  Hand-off given to patient's nurse.    Access used: RT CVC Access issues: none   Total UF removed: 0 Medication(s) given: tramadol Post HD VS: 160/75      [facility]

## 2023-10-23 NOTE — Plan of Care (Signed)
   Problem: Coping: Goal: Ability to adjust to condition or change in health will improve Outcome: Progressing   Problem: Fluid Volume: Goal: Ability to maintain a balanced intake and output will improve Outcome: Progressing

## 2023-10-23 NOTE — Progress Notes (Signed)
 Palliative-   Chart reviewed. Patient having tunneled HD cath placed and to start HD after.  Cr 14.69, BUN 244.  Attempted to see patient but she was off unit for procedure.   Cassondra Stain, AGNP-C Palliative Medicine  No charge

## 2023-10-23 NOTE — Progress Notes (Signed)
 [facility] PROGRESS NOTE    Progress Note  Jill Jefferson  FMW:[id number] DOB: 04-11-1960 DOA: 10/21/2023 PCP: Shelda Atlas, MD     Brief Narrative:   Jill Jefferson is an 63 y.o. female past medical history significant for chronic kidney disease stage V, seizure disorder, HFrEF with a last EF of 40%, chronic kidney disease stage V, insulin -dependent diabetes mellitus type 2 recently discharged from the hospital on October 2025 for heart failure exacerbation and acute kidney injury, during that time she was hesitant to start hemodialysis, she was discharged on high-dose diuretic therapy comes in for dizziness generalized progressive weakness nausea vomiting and intermittent chest tightness   Assessment/Plan:   CKD stage V, new ESRD  Uremia  Hypokalemia  - Renal input greatly appreciated, additive care input greatly appreciated, patient decided HD trial . - HD access by IR today and then dialysis per renal .  Anemia of chronic renal disease/microcytic anemia: - no signs of overt bleeding. -Proved after receiving 1 unit PRBC . - IV iron  per renal   Elevated lipase: Denies any abdominal pain CT scan showed no acute findings. Continue antiemetics.  Elevated troponins: In the setting of chronic renal disease Twelve-lead EKG showed no evidence of ischemia.  Incidental adrenal nodule: CT scan of the abdomen pelvis showed 16mm right adrenal nodule. Will need further imaging for characterization.  Essential hypertension: Continue Coreg  holding diuretics. Use hydralazine  as needed.  Seizure disorder: Continue Keppra .  Chronic HFrEF: Last EF showed a 2D echo 40%. Chest x-ray showed no pulmonary edema.  Insulin -dependent diabetes mellitus type 2: Last A1c of 7.1. Continue long-acting insulin  plus sliding scale.    DVT prophylaxis: Will start subcu heparin  after the catheter insertion Family Communication: None at bedside Status is: Inpatient Remains inpatient  appropriate because: CKD V    Code Status:     Code Status Orders  (From admission, onward)           Start     Ordered   10/21/23 2254  Full code  Continuous       Question:  By:  Answer:  Consent: discussion documented in EHR   10/21/23 2255           Code Status History     Date Active Date Inactive Code Status Order ID Comments User Context   07/12/2023 1559 07/18/2023 1649 Full Code [id number]  Seena Marsa [name], MD Inpatient   05/10/2021 2124 05/14/2021 0106 Full Code [id number]  Marcene Eva [name], DO ED   09/01/2020 2043 09/08/2020 0017 Full Code [id number]  Seena Marsa [name], MD ED         IV Access:   Peripheral IV   Procedures and diagnostic studies:   CT ABDOMEN PELVIS WO CONTRAST Result Date: 10/21/2023 EXAM: CT ABDOMEN AND PELVIS WITHOUT CONTRAST 10/21/2023 07:24:58 PM TECHNIQUE: CT of the abdomen and pelvis was performed without the administration of intravenous contrast. Multiplanar reformatted images are provided for review. Automated exposure control, iterative reconstruction, and/or weight-based adjustment of the mA/kV was utilized to reduce the radiation dose to as low as reasonably achievable. COMPARISON: CT abdomen and pelvis 07/11/2023. CLINICAL HISTORY: Abdominal pain, acute, nonlocalized. Patient has had on and off dizziness episodes for over a month. The worst episode is today. Feels weak. Has been vomiting with diarrhea. Abdominal pain, acute, nonlocalized. Patient has had on and off dizziness episodes for over a month. The worst episode is today. Feels weak. Has been vomiting with diarrhea. FINDINGS: LOWER CHEST: Scattered opacities in the  right lung base. No pleural effusion. LIVER: The liver is unremarkable. GALLBLADDER AND BILE DUCTS: Gallbladder is unremarkable. No biliary ductal dilatation. SPLEEN: No acute abnormality. PANCREAS: No acute abnormality. ADRENAL GLANDS: Indeterminate right adrenal nodule measuring 16 mm. Left adrenal gland within  normal limits. KIDNEYS, URETERS AND BLADDER: Mild right renal atrophy, unchanged. Bilateral renal cysts appear similar to prior. There is a punctate nonobstructing left renal calculus. No hydronephrosis. No perinephric or periureteral stranding. Urinary bladder is unremarkable. Per consensus, no follow-up is needed for simple Bosniak type 1 and 2 renal cysts, unless the patient has a malignancy history or risk factors. GI AND BOWEL: Stomach demonstrates no acute abnormality. The appendix appears within normal limits. Appendicoliths are present. There is no bowel obstruction. PERITONEUM AND RETROPERITONEUM: No ascites. No free air. VASCULATURE: Atherosclerotic calcifications of the aorta and branch vessels. Peripheral vascular calcifications are present. LYMPH NODES: No lymphadenopathy. REPRODUCTIVE ORGANS: No acute abnormality. BONES AND SOFT TISSUES: No acute osseous abnormality. Wide mouth umbilical hernia present, small in size. IMPRESSION: 1. No acute findings in the abdomen or pelvis 2. Indeterminate 16 mm right adrenal nodule; recommend nonemergent adrenal protocol CT or MRI for characterization 3. Punctate nonobstructing left renal calculus without hydronephrosis 4. Right basilar airspace opacities fracture / inflammatory. Electronically signed by: Greig Pique MD 10/21/2023 07:47 PM EDT RP Workstation: [id number]   MR BRAIN WO CONTRAST Result Date: 10/21/2023 EXAM: MR Brain without Intravenous Contrast. CLINICAL HISTORY: Neuro deficit, acute, stroke suspected. TECHNIQUE: Magnetic resonance images of the brain without intravenous contrast in multiple planes. CONTRAST: Without. COMPARISON: CT head 01/01/2023 and MRI head 09/02/2020. FINDINGS: BRAIN: No restricted diffusion to indicate acute infarction. No intracranial mass or hemorrhage. No midline shift or extra-axial fluid collection. No cerebellar tonsillar ectopia. The central arterial and venous flow voids are patent. Postsurgical changes of left  occipital craniotomy with similar appearance of underlying encephalomalacia. There is asymmetric volume loss of the left hippocampus. Susceptibility in the left occipital lobe which may reflect postsurgical changes. VENTRICLES: No hydrocephalus. ORBITS: Right lens replacement. SINUSES AND MASTOIDS: The sinuses and mastoid air cells are clear. BONES: Postsurgical changes of left occipital craniotomy. IMPRESSION: 1. No acute findings. 2. Postsurgical changes of left occipital craniotomy with underlying encephalomalacia. 3. Asymmetric volume loss of the left hippocampus. Electronically signed by: Donnice Mania MD 10/21/2023 06:48 PM EDT RP Workstation: [id number]   DG Chest Port 1 View Result Date: 10/21/2023 CLINICAL DATA:  Intermittent dizziness.  Vomiting and diarrhea. EXAM: PORTABLE CHEST 1 VIEW COMPARISON:  07/24/2023. FINDINGS: Trachea is midline. Heart is at the upper limits of normal in size to mildly enlarged. Lungs are clear. No pleural fluid. IMPRESSION: No acute findings. Electronically Signed   By: Newell Eke M.D.   On: 10/21/2023 17:07     Medical Consultants:   Nephrology Interventional radiology   Subjective:    Jill Jefferson with no significant events overnight, she reports generalized weakness, fatigue  Objective:    Vitals:   10/22/23 1340 10/22/23 1656 10/23/23 0254 10/23/23 0734  BP: 122/60 (!) 161/65 139/73 137/69  Pulse: 66 70  72  Resp: 10 18 18    Temp: 97.9 F (36.6 C) 97.8 F (36.6 C) 98 F (36.7 C) 98.3 F (36.8 C)  TempSrc: Oral Oral Oral Oral  SpO2: 99% 100%  98%  Weight:      Height:       SpO2: 98 %   Intake/Output Summary (Last 24 hours) at 10/23/2023 0956 Last data filed at 10/23/2023 9356 Gross per  24 hour  Intake 304 ml  Output 300 ml  Net 4 ml   Filed Weights   10/21/23 1531  Weight: 61.2 kg    Exam:   somnolent, but wakes up, answering questions appropriately, frail. Symmetrical Chest wall movement, Good air movement  bilaterally RRR,No Gallops,Rubs or new Murmurs, No Parasternal Heave +ve B.Sounds, Abd Soft, No tenderness, No rebound - guarding or rigidity. No Cyanosis, Clubbing or edema, No new Rash or bruise      Data Reviewed:    Labs: Basic Metabolic Panel: Recent Labs  Lab 10/21/23 1652 10/22/23 0755 10/22/23 2327  NA 125* 131* 127*  K 3.3* 2.8* 3.5  CL 70* 76* 73*  CO2 29 34* 31  GLUCOSE 282* 57* 229*  BUN 233* 230* 244*  CREATININE 14.00* 15.10* 14.69*  CALCIUM  7.7* 7.7* 7.3*  MG  --   --  2.2   GFR Estimated Creatinine Clearance: 3.3 mL/min (A) (by C-G formula based on SCr of 14.69 mg/dL (H)). Liver Function Tests: Recent Labs  Lab 10/21/23 1652 10/22/23 2327  AST 23 18  ALT 29 21  ALKPHOS 118 69  BILITOT 0.4 1.0  PROT 7.3 6.4*  ALBUMIN 3.6 2.3*   Recent Labs  Lab 10/21/23 1652  LIPASE 463*   No results for input(s): AMMONIA in the last 168 hours. Coagulation profile No results for input(s): INR, PROTIME in the last 168 hours. COVID-19 Labs  Recent Labs    10/21/23 2146  FERRITIN 897*    Lab Results  Component Value Date   SARSCOV2NAA NEGATIVE 07/11/2023   SARSCOV2NAA NEGATIVE 09/01/2020    CBC: Recent Labs  Lab 10/21/23 1652 10/22/23 0755 10/22/23 2327  WBC 8.7 7.6 8.7  NEUTROABS  --   --  7.0  HGB 7.5* 6.6* 8.7*  HCT 23.6* 20.6* 25.5*  MCV 78.9* 78.6* 76.6*  PLT 209 209 199   Cardiac Enzymes: No results for input(s): CKTOTAL, CKMB, CKMBINDEX, TROPONINI in the last 168 hours. BNP (last 3 results) Recent Labs    07/11/23 1625  PROBNP 33,608.0*   CBG: Recent Labs  Lab 10/22/23 1630 10/22/23 2002 10/23/23 0009 10/23/23 0256 10/23/23 0734  GLUCAP 82 221* 203* 147* 104*   D-Dimer: No results for input(s): DDIMER in the last 72 hours. Hgb A1c: Recent Labs    10/22/23 0315  HGBA1C 9.1*   Lipid Profile: No results for input(s): CHOL, HDL, LDLCALC, TRIG, CHOLHDL, LDLDIRECT in the last 72  hours. Thyroid  function studies: No results for input(s): TSH, T4TOTAL, T3FREE, THYROIDAB in the last 72 hours.  Invalid input(s): FREET3 Anemia work up: Recent Labs    10/21/23 2146  FERRITIN 897*  TIBC 260  IRON  34   Sepsis Labs: Recent Labs  Lab 10/21/23 1652 10/22/23 0755 10/22/23 2327  WBC 8.7 7.6 8.7   Microbiology No results found for this or any previous visit (from the past 240 hours).   Medications:    carvedilol   6.25 mg Oral BID WC   Chlorhexidine  Gluconate Cloth  6 each Topical Q0600   insulin  aspart  0-6 Units Subcutaneous Q4H   levETIRAcetam   500 mg Oral BID   Continuous Infusions:    LOS: 2 days   Jill Jefferson  [facility]  10/23/2023, 9:56 AM

## 2023-10-24 DIAGNOSIS — N185 Chronic kidney disease, stage 5: Secondary | ICD-10-CM | POA: Diagnosis not present

## 2023-10-24 LAB — BASIC METABOLIC PANEL WITH GFR
Anion gap: 14 (ref 5–15)
BUN: 122 mg/dL — ABNORMAL HIGH (ref 8–23)
CO2: 30 mmol/L (ref 22–32)
Calcium: 8.3 mg/dL — ABNORMAL LOW (ref 8.9–10.3)
Chloride: 86 mmol/L — ABNORMAL LOW (ref 98–111)
Creatinine, Ser: 9.46 mg/dL — ABNORMAL HIGH (ref 0.44–1.00)
GFR, Estimated: 4 mL/min — ABNORMAL LOW (ref >60)
Glucose, Bld: 95 mg/dL (ref 70–99)
Potassium: 3.7 mmol/L (ref 3.5–5.1)
Sodium: 130 mmol/L — ABNORMAL LOW (ref 135–145)

## 2023-10-24 LAB — LIPASE, BLOOD: Lipase: 94 U/L — ABNORMAL HIGH (ref 11–51)

## 2023-10-24 LAB — HEPATITIS B SURFACE ANTIBODY, QUANTITATIVE: Hep B S AB Quant (Post): 88 m[IU]/mL

## 2023-10-24 LAB — CBC
HCT: 27.5 % — ABNORMAL LOW (ref 36.0–46.0)
Hemoglobin: 9 g/dL — ABNORMAL LOW (ref 12.0–15.0)
MCH: 26 pg (ref 26.0–34.0)
MCHC: 32.7 g/dL (ref 30.0–36.0)
MCV: 79.5 fL — ABNORMAL LOW (ref 80.0–100.0)
Platelets: 208 K/uL (ref 150–400)
RBC: 3.46 MIL/uL — ABNORMAL LOW (ref 3.87–5.11)
RDW: 15.1 % (ref 11.5–15.5)
WBC: 9.4 K/uL (ref 4.0–10.5)
nRBC: 0 % (ref 0.0–0.2)

## 2023-10-24 LAB — GLUCOSE, CAPILLARY
Glucose-Capillary: 106 mg/dL — ABNORMAL HIGH (ref 70–99)
Glucose-Capillary: 122 mg/dL — ABNORMAL HIGH (ref 70–99)
Glucose-Capillary: 83 mg/dL (ref 70–99)
Glucose-Capillary: 85 mg/dL (ref 70–99)
Glucose-Capillary: 86 mg/dL (ref 70–99)
Glucose-Capillary: 94 mg/dL (ref 70–99)

## 2023-10-24 MED ORDER — INSULIN ASPART 100 UNIT/ML IJ SOLN
0.0000 [IU] | Freq: Every day | INTRAMUSCULAR | Status: DC
  Administered 2023-10-25 – 2023-10-26 (×2): 4 [IU] via SUBCUTANEOUS
  Administered 2023-10-29: 2 [IU] via SUBCUTANEOUS

## 2023-10-24 MED ORDER — METOCLOPRAMIDE HCL 5 MG/ML IJ SOLN
5.0000 mg | Freq: Once | INTRAMUSCULAR | Status: AC
  Administered 2023-10-24: 5 mg via INTRAVENOUS
  Filled 2023-10-24: qty 2

## 2023-10-24 MED ORDER — HEPARIN SODIUM (PORCINE) 1000 UNIT/ML IJ SOLN
INTRAMUSCULAR | Status: AC
  Filled 2023-10-24: qty 4

## 2023-10-24 MED ORDER — NEPRO/CARBSTEADY PO LIQD
237.0000 mL | Freq: Three times a day (TID) | ORAL | Status: DC
  Administered 2023-10-24 – 2023-10-25 (×4): 237 mL via ORAL

## 2023-10-24 MED ORDER — INSULIN ASPART 100 UNIT/ML IJ SOLN
0.0000 [IU] | Freq: Three times a day (TID) | INTRAMUSCULAR | Status: DC
  Administered 2023-10-25: 3 [IU] via SUBCUTANEOUS
  Administered 2023-10-26: 1 [IU] via SUBCUTANEOUS
  Administered 2023-10-26: 5 [IU] via SUBCUTANEOUS
  Administered 2023-10-26: 1 [IU] via SUBCUTANEOUS
  Administered 2023-10-27: 5 [IU] via SUBCUTANEOUS
  Administered 2023-10-27: 3 [IU] via SUBCUTANEOUS
  Administered 2023-10-28: 1 [IU] via SUBCUTANEOUS
  Administered 2023-10-29: 7 [IU] via SUBCUTANEOUS
  Administered 2023-10-29: 1 [IU] via SUBCUTANEOUS
  Administered 2023-10-30: 5 [IU] via SUBCUTANEOUS
  Administered 2023-10-30: 2 [IU] via SUBCUTANEOUS

## 2023-10-24 NOTE — Progress Notes (Signed)
 Patient ID: Tykia Mellone, female   DOB: October 24, 1960, 63 y.o.   MRN: [id number] S: More awake today but still nauseated O:BP 139/65   Pulse 61   Temp 97.8 F (36.6 C) (Oral)   Resp 10   Ht 5' (1.524 m)   Wt 52.6 kg   SpO2 99%   BMI 22.65 kg/m   Intake/Output Summary (Last 24 hours) at 10/24/2023 1238 Last data filed at 10/24/2023 1100 Gross per 24 hour  Intake --  Output 600 ml  Net -600 ml   Intake/Output: I/O last 3 completed shifts: In: -  Out: 850 [Urine:1050]  Intake/Output this shift:  Total I/O In: -  Out: 50 [Emesis/NG output:50] Weight change:  Gen: mild distress from nausea CVS: no rub Resp: CTA Abd: +BS, soft, NT/ND Ext: no edema  Recent Labs  Lab 10/21/23 1652 10/22/23 0755 10/22/23 2327 10/24/23 0428  NA 125* 131* 127* 130*  K 3.3* 2.8* 3.5 3.7  CL 70* 76* 73* 86*  CO2 29 34* 31 30  GLUCOSE 282* 57* 229* 95  BUN 233* 230* 244* 122*  CREATININE 14.00* 15.10* 14.69* 9.46*  ALBUMIN 3.6  --  2.3*  --   CALCIUM  7.7* 7.7* 7.3* 8.3*  AST 23  --  18  --   ALT 29  --  21  --    Liver Function Tests: Recent Labs  Lab 10/21/23 1652 10/22/23 2327  AST 23 18  ALT 29 21  ALKPHOS 118 69  BILITOT 0.4 1.0  PROT 7.3 6.4*  ALBUMIN 3.6 2.3*   Recent Labs  Lab 10/21/23 1652 10/24/23 0428  LIPASE 463* 94*   No results for input(s): AMMONIA in the last 168 hours. CBC: Recent Labs  Lab 10/21/23 1652 10/22/23 0755 10/22/23 2327 10/24/23 0428  WBC 8.7 7.6 8.7 9.4  NEUTROABS  --   --  7.0  --   HGB 7.5* 6.6* 8.7* 9.0*  HCT 23.6* 20.6* 25.5* 27.5*  MCV 78.9* 78.6* 76.6* 79.5*  PLT 209 209 199 208   Cardiac Enzymes: No results for input(s): CKTOTAL, CKMB, CKMBINDEX, TROPONINI in the last 168 hours. CBG: Recent Labs  Lab 10/23/23 1933 10/24/23 0020 10/24/23 0412 10/24/23 0819 10/24/23 1235  GLUCAP 116* 94 85 106* 86    Iron  Studies:  Recent Labs    10/21/23 2146  IRON  34  TIBC 260  FERRITIN 897*   Studies/Results: IR  TUNNELED CENTRAL VENOUS CATH PLC W IMG Result Date: 10/23/2023 INDICATION: ESRD requiring HD initiation. EXAM: TUNNELED CENTRAL VENOUS HEMODIALYSIS CATHETER PLACEMENT WITH ULTRASOUND AND FLUOROSCOPIC GUIDANCE MEDICATIONS: Ancef 2 gm IV. The antibiotic was given in an appropriate time interval prior to skin puncture. ANESTHESIA/SEDATION: Moderate (conscious) sedation was employed during this procedure. A total of Versed  1 mg and Fentanyl 50 mcg was administered intravenously. Moderate Sedation Time: 15 minutes. The patient's level of consciousness and vital signs were monitored continuously by radiology nursing throughout the procedure under my direct supervision. FLUOROSCOPY: Radiation Exposure Index and estimated peak skin dose (PSD); Reference air kerma (RAK), 0.1 mGy. COMPLICATIONS: None immediate. PROCEDURE: Informed written consent was obtained from the patient after a discussion of the risks, benefits, and alternatives to treatment. Questions regarding the procedure were encouraged and answered. The RIGHT neck and chest were prepped with chlorhexidine  in a sterile fashion, and a sterile drape was applied covering the operative field. Maximum barrier sterile technique with sterile gowns and gloves were used for the procedure. A timeout was performed prior to the initiation of  the procedure. After creating a small venotomy incision, a micropuncture kit was utilized to access the internal jugular vein. Real-time ultrasound guidance was utilized for vascular access including the acquisition of a permanent ultrasound image documenting patency of the accessed vessel. The microwire was utilized to measure appropriate catheter length. A stiff Glidewire was advanced to the level of the IVC and the micropuncture sheath was exchanged for a peel-away sheath. A palindrome tunneled hemodialysis catheter measuring 19 cm from tip to cuff was tunneled in a retrograde fashion from the anterior chest wall to the venotomy  incision. The catheter was then placed through the peel-away sheath with tips ultimately positioned within the superior aspect of the right atrium. Final catheter positioning was confirmed and documented with a spot radiographic image. The catheter aspirates and flushes normally. The catheter was flushed with appropriate volume heparin  dwells. The catheter exit site was secured with a 2-0 Ethilon retention suture. The venotomy incision was closed with Dermabond. Dressings were applied. The patient tolerated the procedure well without immediate post procedural complication. IMPRESSION: Successful placement of 19 cm tip to cuff tunneled hemodialysis catheter via the RIGHT internal jugular vein. The tip of the catheter is positioned within the proximal RIGHT atrium. The catheter is ready for immediate use. Thom Hall, MD [facility] Electronically Signed   By: Thom Hall M.D.   On: 10/23/2023 14:05    carvedilol   6.25 mg Oral BID WC   Chlorhexidine  Gluconate Cloth  6 each Topical Q0600   heparin  sodium (porcine)  10 mL Intracatheter Once   insulin  aspart  0-5 Units Subcutaneous QHS   insulin  aspart  0-9 Units Subcutaneous TID WC   levETIRAcetam   500 mg Oral BID   lidocaine-EPINEPHrine  20 mL Infiltration Once   metoCLOPramide (REGLAN) injection  5 mg Intravenous Once    BMET    Component Value Date/Time   NA 130 (L) 10/24/2023 0428   NA 139 06/23/2019 1208   K 3.7 10/24/2023 0428   CL 86 (L) 10/24/2023 0428   CO2 30 10/24/2023 0428   GLUCOSE 95 10/24/2023 0428   BUN 122 (H) 10/24/2023 0428   BUN 27 (H) 06/23/2019 1208   CREATININE 9.46 (H) 10/24/2023 0428   CREATININE 3.48 (H) 06/08/2021 0000   CALCIUM  8.3 (L) 10/24/2023 0428   GFRNONAA 4 (L) 10/24/2023 0428   GFRAA 39 (L) 06/23/2019 1208   CBC    Component Value Date/Time   WBC 9.4 10/24/2023 0428   RBC 3.46 (L) 10/24/2023 0428   HGB 9.0 (L) 10/24/2023 0428   HGB 11.1  06/23/2019 1208   HCT 27.5 (L) 10/24/2023 0428   HCT 33.2 (L) 06/23/2019 1208   PLT 208 10/24/2023 0428   PLT 257 06/23/2019 1208   MCV 79.5 (L) 10/24/2023 0428   MCV 81 06/23/2019 1208   MCH 26.0 10/24/2023 0428   MCHC 32.7 10/24/2023 0428   RDW 15.1 10/24/2023 0428   RDW 12.4 06/23/2019 1208   LYMPHSABS 0.8 10/22/2023 2327   LYMPHSABS 1.8 06/23/2019 1208   MONOABS 0.7 10/22/2023 2327   EOSABS 0.1 10/22/2023 2327   EOSABS 0.1 06/23/2019 1208   BASOSABS 0.0 10/22/2023 2327   BASOSABS 0.0 06/23/2019 1208    Assessment 47F with historical CKD 5 now progressing to uremia, significant azotemia.   Uremia with malaise, neurological symptoms, GI symptoms and severe azotemia CKD 5, now reasonable to declare ESRD Microcytic anemia Mild hypokalemia Increased anion gap metabolic acidosis with metabolic alkalosis  Seizure disorder  hypertension   Plan She is presenting with progressive uremia and this is the time to start dialysis.  At length discussed this with patient and daughter.  She finally decided to proceed with dialysis and had RIJ TDC placed by RI followed by short HD sessionon 10/23/23.  Plan for ongoing serial HD which will be progressively longer and faster to prevent dialysis dysequilibrium syndrome.  She is high risk given her advanced uremia and significant azotemia.  Will need to involve renal navigator to arrange for outpatient dialysis.  Will also need to consult [facility] for eventual access placement once more stable.  Will continue with ongoing education and discuss home modalities but daughter expresses some concerns about her suitability to do this.  This could all be sorted out and something like her transitional care unit if she does begin dialysis.  Extensive discussion with the patient regarding goals of care. Would not replace potassium Can give some gentle hydration in case she is hypovolemic with her poor oral intake and nausea/vomiting Check iron  levels, consider ESA  based upon her clinical objectives RIJ TDC placed by IR on 10/23/23  Fairy RONAL Sellar, MD [facility]

## 2023-10-24 NOTE — NC FL2 (Signed)
 [place]  MEDICAID FL2 LEVEL OF CARE FORM     IDENTIFICATION  Patient Name: Jill Jefferson Birthdate: 1960/02/27 Sex: female Admission Date (Current Location): 10/21/2023  [facility] and [place] Number:  Producer, television/film/video and Address:  [facility] [place], [address], [place], [place] [address]      Provider Number: [id number]  Attending Physician Name and Address:  Sherlon Brayton RAMAN, MD  Relative Name and Phone Number:  Dacey,CHRISTINE (Daughter)  [phone]    Current Level of Care: Hospital Recommended Level of Care: Skilled Nursing Facility Prior Approval Number:    Date Approved/Denied:   PASRR Number: [id number]  Discharge Plan: SNF    Current Diagnoses: Patient Active Problem List   Diagnosis Date Noted   CKD (chronic kidney disease) stage 5, GFR less than 15 ml/min (HCC) 10/21/2023   Hyponatremia 10/21/2023   Hypokalemia 10/21/2023   Anemia 10/21/2023   Dizziness 10/21/2023   Acute respiratory failure with hypoxia (HCC) 07/12/2023   History of status epilepticus 07/12/2023   Acute on chronic diastolic CHF (congestive heart failure) (HCC) 07/12/2023   Murmur, cardiac 04/02/2023   Hypertension    CKD (chronic kidney disease) stage 4, GFR 15-29 ml/min (HCC)    Seizure disorder (HCC)    Acute renal failure superimposed on stage 5 chronic kidney disease, not on chronic dialysis (HCC) 09/01/2020   Hemoglobin A1c less than 7.0% 11/26/2018   Hyperglycemia 11/26/2018   Proteinuria 11/26/2018   Joint pain in fingers of both hands 08/03/2018   Bilateral lower extremity edema 05/03/2018   Uncontrolled type 2 diabetes mellitus with hyperglycemia, with long-term current use of insulin  (HCC) 01/31/2018   Age-related cataract of both eyes 01/31/2018    Orientation RESPIRATION BLADDER Height & Weight     Self, Situation, Place  Normal Continent Weight: 113 lb 8.6 oz (51.5 kg) Height:  5' (152.4 cm)  BEHAVIORAL SYMPTOMS/MOOD NEUROLOGICAL  BOWEL NUTRITION STATUS      Incontinent Diet (see dc summary)  AMBULATORY STATUS COMMUNICATION OF NEEDS Skin   Limited Assist Verbally Normal                       Personal Care Assistance Level of Assistance  Bathing, Feeding, Dressing Bathing Assistance: Limited assistance Feeding assistance: Limited assistance Dressing Assistance: Limited assistance     Functional Limitations Info  Sight Sight Info: Impaired (glasses)        SPECIAL CARE FACTORS FREQUENCY  PT (By licensed PT), OT (By licensed OT)     PT Frequency: 5x/week OT Frequency: 5x/week            Contractures Contractures Info: Not present    Additional Factors Info  Code Status, Allergies Code Status Info: Full Allergies Info: NKA           Current Medications (10/24/2023):  This is the current hospital active medication list Current Facility-Administered Medications  Medication Dose Route Frequency Provider Last Rate Last Admin   acetaminophen  (TYLENOL ) tablet 1,000 mg  1,000 mg Oral Q6H PRN Segars, Jonathan, MD   1,000 mg at 10/23/23 1229   carvedilol  (COREG ) tablet 6.25 mg  6.25 mg Oral BID WC Alfornia Madison, MD   6.25 mg at 10/24/23 0846   Chlorhexidine  Gluconate Cloth 2 % PADS 6 each  6 each Topical Q0600 Geralynn Charleston, MD   6 each at 10/24/23 0555   docusate sodium (COLACE) capsule 200 mg  200 mg Oral BID PRN Keturah Carrier, MD   200  mg at 10/23/23 0022   feeding supplement (NEPRO CARB STEADY) liquid 237 mL  237 mL Oral TID BM Elgergawy, Dawood S, MD   237 mL at 10/24/23 1340   heparin  sodium (porcine) injection 10,000 Units  10 mL Intracatheter Once Mugweru, Jon, MD       insulin  aspart (novoLOG ) injection 0-5 Units  0-5 Units Subcutaneous QHS Elgergawy, Dawood S, MD       insulin  aspart (novoLOG ) injection 0-9 Units  0-9 Units Subcutaneous TID WC Elgergawy, Dawood S, MD       levETIRAcetam  (KEPPRA ) tablet 500 mg  500 mg Oral BID Rathore, Vasundhra, MD   500 mg at 10/24/23 1102    lidocaine-EPINEPHrine (XYLOCAINE W/EPI) 1 %-1:100000 (with pres) injection 20 mL  20 mL Infiltration Once Mugweru, Jon, MD       ondansetron (ZOFRAN) injection 4 mg  4 mg Intravenous Q6H PRN Rathore, Vasundhra, MD   4 mg at 10/24/23 1123   tiZANidine (ZANAFLEX) tablet 1 mg  1 mg Oral QHS PRN Segars, Jonathan, MD   1 mg at 10/23/23 0022   traMADol (ULTRAM) tablet 50 mg  50 mg Oral BID PRN Elgergawy, Dawood S, MD   50 mg at 10/24/23 9444     Discharge Medications: Please see discharge summary for a list of discharge medications.  Relevant Imaging Results:  Relevant Lab Results:   Additional Information SSN [id number]. will require transport to outpatient dialysis-clinic location and time pending SNF choice and location  Inocente GORMAN Kindle, LCSW

## 2023-10-24 NOTE — Plan of Care (Signed)

## 2023-10-24 NOTE — Progress Notes (Signed)
  Received patient in bed to unit.   Informed consent signed and in chart.    TX duration:2.5      Transported by  Hand-off given to patient's nurse.    Access used: RT CVC Access issues: none   Total UF removed: 0 Medication(s) given:none  Post HD VS: 136/58    Hunter Hacking LPN [facility]

## 2023-10-24 NOTE — Plan of Care (Signed)

## 2023-10-24 NOTE — Progress Notes (Signed)
 [facility] PROGRESS NOTE    Progress Note  Jill Jefferson  FMW:[id number] DOB: 1960/03/21 DOA: 10/21/2023 PCP: Shelda Atlas, MD     Brief Narrative:   Jill Jefferson is an 63 y.o. female past medical history significant for chronic kidney disease stage V, seizure disorder, HFrEF with a last EF of 40%, chronic kidney disease stage V, insulin -dependent diabetes mellitus type 2 recently discharged from the hospital on October 2025 for heart failure exacerbation and acute kidney injury, during that time she was hesitant to start hemodialysis, she was discharged on high-dose diuretic therapy comes in for dizziness generalized progressive weakness nausea vomiting and intermittent chest tightness, her workup was significant for progressive renal failure, started on hemodialysis.   Assessment/Plan:   CKD stage V, new ESRD  Uremia  Hypokalemia  - Renal input greatly appreciated, palliative care input greatly appreciated, patient decided HD trial . - HD access by IR 10/15, started HD on 10/15 - Further management per renal  Anemia of chronic renal disease/microcytic anemia: - no signs of overt bleeding. - Improved after receiving 1 unit PRBC . - IV iron  per renal   Hyponatremia - Improving with dialysis  Elevated lipase: Denies any abdominal pain CT scan showed no acute findings. Continue antiemetics. - Down  Elevated troponins: In the setting of chronic renal disease Twelve-lead EKG showed no evidence of ischemia.  Incidental adrenal nodule: CT scan of the abdomen pelvis showed 16mm right adrenal nodule. Will need further imaging for characterization.  Essential hypertension: Continue Coreg  holding diuretics. Use hydralazine  as needed.  Seizure disorder: Continue Keppra .  Chronic HFrEF: Last EF showed a 2D echo 40%. Chest x-ray showed no pulmonary edema. - Volume management with HD  Insulin -dependent diabetes mellitus type 2: Controlled with last A1c  9.1 But she remains with poor appetite, so holding long-acting insulin , continue with sensitive insulin  sliding scale.    DVT prophylaxis:  subcu heparin   Family Communication: None at bedside Status is: Inpatient Remains inpatient appropriate because: CKD V    Code Status:     Code Status Orders  (From admission, onward)           Start     Ordered   10/21/23 2254  Full code  Continuous       Question:  By:  Answer:  Consent: discussion documented in EHR   10/21/23 2255           Code Status History     Date Active Date Inactive Code Status Order ID Comments User Context   07/12/2023 1559 07/18/2023 1649 Full Code [id number]  Seena Marsa [name], MD Inpatient   05/10/2021 2124 05/14/2021 0106 Full Code [id number]  Marcene Eva [name], DO ED   09/01/2020 2043 09/08/2020 0017 Full Code [id number]  Seena Marsa [name], MD ED         IV Access:   Peripheral IV   Procedures and diagnostic studies:   IR TUNNELED CENTRAL VENOUS CATH [facility] Result Date: 10/23/2023 INDICATION: ESRD requiring HD initiation. EXAM: TUNNELED CENTRAL VENOUS HEMODIALYSIS CATHETER PLACEMENT WITH ULTRASOUND AND FLUOROSCOPIC GUIDANCE MEDICATIONS: Ancef 2 gm IV. The antibiotic was given in an appropriate time interval prior to skin puncture. ANESTHESIA/SEDATION: Moderate (conscious) sedation was employed during this procedure. A total of Versed  1 mg and Fentanyl 50 mcg was administered intravenously. Moderate Sedation Time: 15 minutes. The patient's level of consciousness and vital signs were monitored continuously by radiology nursing throughout the procedure under my direct supervision. FLUOROSCOPY: Radiation Exposure Index and estimated  peak skin dose (PSD); Reference air kerma (RAK), 0.1 mGy. COMPLICATIONS: None immediate. PROCEDURE: Informed written consent was obtained from the patient after a discussion of the risks, benefits, and alternatives to treatment. Questions regarding the procedure were  encouraged and answered. The RIGHT neck and chest were prepped with chlorhexidine  in a sterile fashion, and a sterile drape was applied covering the operative field. Maximum barrier sterile technique with sterile gowns and gloves were used for the procedure. A timeout was performed prior to the initiation of the procedure. After creating a small venotomy incision, a micropuncture kit was utilized to access the internal jugular vein. Real-time ultrasound guidance was utilized for vascular access including the acquisition of a permanent ultrasound image documenting patency of the accessed vessel. The microwire was utilized to measure appropriate catheter length. A stiff Glidewire was advanced to the level of the IVC and the micropuncture sheath was exchanged for a peel-away sheath. A palindrome tunneled hemodialysis catheter measuring 19 cm from tip to cuff was tunneled in a retrograde fashion from the anterior chest wall to the venotomy incision. The catheter was then placed through the peel-away sheath with tips ultimately positioned within the superior aspect of the right atrium. Final catheter positioning was confirmed and documented with a spot radiographic image. The catheter aspirates and flushes normally. The catheter was flushed with appropriate volume heparin  dwells. The catheter exit site was secured with a 2-0 Ethilon retention suture. The venotomy incision was closed with Dermabond. Dressings were applied. The patient tolerated the procedure well without immediate post procedural complication. IMPRESSION: Successful placement of 19 cm tip to cuff tunneled hemodialysis catheter via the RIGHT internal jugular vein. The tip of the catheter is positioned within the proximal RIGHT atrium. The catheter is ready for immediate use. Thom Hall, MD [facility] Radiology Electronically Signed   By: Thom Hall M.D.   On: 10/23/2023 14:05     Medical Consultants:    Nephrology Interventional radiology   Subjective:    Jill Jefferson reports she tolerated HD well yesterday, she had a good night sleep, she still reports fatigue and poor appetite  Objective:    Vitals:   10/24/23 0017 10/24/23 0407 10/24/23 0800 10/24/23 0815  BP: (!) 161/69 (!) 162/65 (!) 141/65 139/63  Pulse:   64 62  Resp:   11 13  Temp: 97.6 F (36.4 C) 97.8 F (36.6 C)  97.8 F (36.6 C)  TempSrc: Oral Oral  Oral  SpO2:   96% 98%  Weight:      Height:       SpO2: 98 % O2 Flow Rate (L/min): 2 L/min   Intake/Output Summary (Last 24 hours) at 10/24/2023 1036 Last data filed at 10/24/2023 0410 Gross per 24 hour  Intake --  Output 550 ml  Net -550 ml   Filed Weights   10/21/23 1531 10/23/23 1436 10/23/23 1657  Weight: 61.2 kg 52.4 kg 52.6 kg    Exam:   Awake Alert, frail, deconditioned Symmetrical Chest wall movement, Good air movement bilaterally RRR,No Gallops,Rubs or new Murmurs, No Parasternal Heave +ve B.Sounds, Abd Soft No Cyanosis, Clubbing or edema       Data Reviewed:    Labs: Basic Metabolic Panel: Recent Labs  Lab 10/21/23 1652 10/22/23 0755 10/22/23 2327 10/24/23 0428  NA 125* 131* 127* 130*  K 3.3* 2.8* 3.5 3.7  CL 70* 76* 73* 86*  CO2 29 34* 31 30  GLUCOSE 282* 57* 229* 95  BUN 233*  230* 244* 122*  CREATININE 14.00* 15.10* 14.69* 9.46*  CALCIUM  7.7* 7.7* 7.3* 8.3*  MG  --   --  2.2  --    GFR Estimated Creatinine Clearance: 4.5 mL/min (A) (by C-G formula based on SCr of 9.46 mg/dL (H)). Liver Function Tests: Recent Labs  Lab 10/21/23 1652 10/22/23 2327  AST 23 18  ALT 29 21  ALKPHOS 118 69  BILITOT 0.4 1.0  PROT 7.3 6.4*  ALBUMIN 3.6 2.3*   Recent Labs  Lab 10/21/23 1652 10/24/23 0428  LIPASE 463* 94*   No results for input(s): AMMONIA in the last 168 hours. Coagulation profile No results for input(s): INR, PROTIME in the last 168 hours. COVID-19 Labs  Recent Labs    10/21/23 2146   FERRITIN 897*    Lab Results  Component Value Date   SARSCOV2NAA NEGATIVE 07/11/2023   SARSCOV2NAA NEGATIVE 09/01/2020    CBC: Recent Labs  Lab 10/21/23 1652 10/22/23 0755 10/22/23 2327 10/24/23 0428  WBC 8.7 7.6 8.7 9.4  NEUTROABS  --   --  7.0  --   HGB 7.5* 6.6* 8.7* 9.0*  HCT 23.6* 20.6* 25.5* 27.5*  MCV 78.9* 78.6* 76.6* 79.5*  PLT 209 209 199 208   Cardiac Enzymes: No results for input(s): CKTOTAL, CKMB, CKMBINDEX, TROPONINI in the last 168 hours. BNP (last 3 results) Recent Labs    07/11/23 1625  PROBNP 33,608.0*   CBG: Recent Labs  Lab 10/23/23 1734 10/23/23 1933 10/24/23 0020 10/24/23 0412 10/24/23 0819  GLUCAP 126* 116* 94 85 106*   D-Dimer: No results for input(s): DDIMER in the last 72 hours. Hgb A1c: Recent Labs    10/22/23 0315  HGBA1C 9.1*   Lipid Profile: No results for input(s): CHOL, HDL, LDLCALC, TRIG, CHOLHDL, LDLDIRECT in the last 72 hours. Thyroid  function studies: No results for input(s): TSH, T4TOTAL, T3FREE, THYROIDAB in the last 72 hours.  Invalid input(s): FREET3 Anemia work up: Recent Labs    10/21/23 2146  FERRITIN 897*  TIBC 260  IRON  34   Sepsis Labs: Recent Labs  Lab 10/21/23 1652 10/22/23 0755 10/22/23 2327 10/24/23 0428  WBC 8.7 7.6 8.7 9.4   Microbiology No results found for this or any previous visit (from the past 240 hours).   Medications:    carvedilol   6.25 mg Oral BID WC   Chlorhexidine  Gluconate Cloth  6 each Topical Q0600   heparin  sodium (porcine)  10 mL Intracatheter Once   insulin  aspart  0-6 Units Subcutaneous Q4H   levETIRAcetam   500 mg Oral BID   lidocaine-EPINEPHrine  20 mL Infiltration Once   Continuous Infusions:   ceFAZolin (ANCEF) IV        LOS: 3 days   Jill Jefferson  [facility]  10/24/2023, 10:36 AM

## 2023-10-24 NOTE — Progress Notes (Addendum)
 Requested to see pt for out-pt HD needs at d/c. Attempted to meet with pt at bedside this morning but unable to wake pt from sleep. Spoke to pt's daughter, Jill Jefferson, via phone. Introduced self and explained role. Discussed out-pt HD options. Daughter would like for navigator and daughter to meet with pt together to discuss options. Offered to meet with pt and pt's daughter this afternoon. Daughter agreeable and to contact navigator when she arrives to pt's room. Daughter requesting info/resources for transportation to/from HD appts at d/c. Daughter also inquiring about DME needs at home at d/c as well. Contacted RN case Production designer, theatre/television/film and CSW regarding daughter's request for assistance/information regarding d/c needs. Will meet with pt and pt's daughter and proceed with out-pt HD referral from there. Will assist as needed.   Jill Jefferson Dialysis Navigator [phone]  Addendum at 1:01 pm: Met with pt and pt's daughter (oldest daughter) at bedside. Introduced self and explained role. Both advised that navigator spoke to daughter, Jill Jefferson, earlier today. Discussed out-pt HD options- TCU (4x's a week) vs in-center (3x's a week). Pt prefers 3x's a week and daughter at bedside feels pt will likely need rehab at d/c prior to pt's return home. Explained that HD clinic will be arranged according to pt's d/c plan. If pt goes to snf, will need to locate a clinic that snf will transport pt to/from for HD appts. Pt and daughter voice understanding. Update of conversation provided to daughter, Jill Jefferson as well. Contacted RN CM and CSW to provide update regarding conversation with pt/daughter.

## 2023-10-24 NOTE — Evaluation (Signed)
 Physical Therapy Evaluation Patient Details Name: Jill Jefferson MRN: [id number] DOB: [birth year] Today's Date: 10/24/2023  History of Present Illness  Pt is a 63 year old female who comes in for dizziness generalized progressive weakness nausea vomiting and intermittent chest tightness, her workup was significant for progressive renal failure, started on hemodialysis. Past medical history significant for chronic kidney disease stage V, seizure disorder, HFrEF with a last EF of 40%, chronic kidney disease stage V, insulin -dependent diabetes mellitus type 2 recently discharged from the hospital on October 2025 for heart failure exacerbation and acute kidney injury, during that time she was hesitant to start hemodialysis, she was discharged on high-dose diuretic therapy.  Clinical Impression  Pt presents with admitting diagnosis above. Session today limited as pt had an episode of emesis once seated EOB. Further mobility deferred due to nausea. Family present and reports that pt has been needing a lot of support lately for mobility and ADLs. Patient will benefit from continued inpatient follow up therapy, <3 hours/day. PT will continue to follow.         If plan is discharge home, recommend the following: A lot of help with walking and/or transfers;A lot of help with bathing/dressing/bathroom;Assistance with cooking/housework;Assist for transportation;Help with stairs or ramp for entrance   Can travel by private vehicle   Yes    Equipment Recommendations Other (comment) (TBD)  Recommendations for Other Services       Functional Status Assessment Patient has had a recent decline in their functional status and demonstrates the ability to make significant improvements in function in a reasonable and predictable amount of time.     Precautions / Restrictions Precautions Precautions: Fall Recall of Precautions/Restrictions: Impaired Restrictions Weight Bearing Restrictions Per Provider Order:  No      Mobility  Bed Mobility Overal bed mobility: Needs Assistance Bed Mobility: Sit to Supine       Sit to supine: Contact guard assist   General bed mobility comments: RN helped pt to EOB. CGA to return to bed after episode of emesis.    Transfers                   General transfer comment: Deferred due to emesis    Ambulation/Gait               General Gait Details: Deferred due to emesis  Stairs            Wheelchair Mobility     Tilt Bed    Modified Rankin (Stroke Patients Only)       Balance                                             Pertinent Vitals/Pain Pain Assessment Pain Assessment: No/denies pain    Home Living Family/patient expects to be discharged to:: Private residence Living Arrangements: Children Available Help at Discharge: Family;Available PRN/intermittently Type of Home: Apartment Home Access: Stairs to enter   Entrance Stairs-Number of Steps: 1 Alternate Level Stairs-Number of Steps: 14 Home Layout: Two level;Bed/bath upstairs Home Equipment: None      Prior Function Prior Level of Function : Needs assist             Mobility Comments: Mostly mod I furniture surfing however daughter would assist ADLs Comments: Daughter assist     Extremity/Trunk Assessment   Upper Extremity Assessment Upper Extremity Assessment: Generalized  weakness    Lower Extremity Assessment Lower Extremity Assessment: Generalized weakness    Cervical / Trunk Assessment Cervical / Trunk Assessment: Normal  Communication   Communication Communication: No apparent difficulties    Cognition Arousal: Alert Behavior During Therapy: Flat affect   PT - Cognitive impairments: No apparent impairments                         Following commands: Intact       Cueing Cueing Techniques: Verbal cues, Tactile cues     General Comments General comments (skin integrity, edema, etc.): Daughter  and sister present    Exercises     Assessment/Plan    PT Assessment Patient needs continued PT services  PT Problem List Decreased strength;Decreased range of motion;Decreased activity tolerance;Decreased mobility;Decreased balance;Decreased coordination;Decreased safety awareness;Decreased knowledge of use of DME;Decreased knowledge of precautions;Cardiopulmonary status limiting activity       PT Treatment Interventions DME instruction;Gait training;Stair training;Functional mobility training;Therapeutic activities;Therapeutic exercise;Balance training;Neuromuscular re-education;Patient/family education    PT Goals (Current goals can be found in the Care Plan section)  Acute Rehab PT Goals Patient Stated Goal: to get better PT Goal Formulation: With patient Time For Goal Achievement: 11/07/23 Potential to Achieve Goals: Fair    Frequency Min 2X/week     Co-evaluation               AM-PAC PT 6 Clicks Mobility  Outcome Measure Help needed turning from your back to your side while in a flat bed without using bedrails?: A Little Help needed moving from lying on your back to sitting on the side of a flat bed without using bedrails?: A Little Help needed moving to and from a bed to a chair (including a wheelchair)?: A Little Help needed standing up from a chair using your arms (e.g., wheelchair or bedside chair)?: A Lot Help needed to walk in hospital room?: A Lot Help needed climbing 3-5 steps with a railing? : Total 6 Click Score: 14    End of Session   Activity Tolerance: Other (comment) (Limited by emesis) Patient left: in bed;with call bell/phone within reach;with family/visitor present Nurse Communication: Mobility status PT Visit Diagnosis: Other abnormalities of gait and mobility (R26.89)    Time: 8877-8853 PT Time Calculation (min) (ACUTE ONLY): 24 min   Charges:   PT Evaluation $PT Eval Moderate Complexity: 1 Mod PT Treatments $Therapeutic Activity:  8-22 mins PT General Charges $$ ACUTE PT VISIT: 1 Visit         Sueellen [name], PT, DPT Acute Rehab Services [phone]   Jill Jefferson 10/24/2023, 2:04 PM

## 2023-10-24 NOTE — TOC Initial Note (Signed)
 Transition of Care [facility]) - Initial/Assessment Note    Patient Details  Name: Jill Jefferson MRN: [id number] Date of Birth: 07/05/1960  Transition of Care [facility]) CM/SW Contact:    Inocente GORMAN Kindle, LCSW Phone Number: 10/24/2023, 3:27 PM  Clinical Narrative:                 CSW received consult for possible SNF placement at time of discharge. CSW spoke with patient's daughter, Byron, at bedside. She reported that she lives out of state but that patient lives with her sister who works and goes to school so cannot care for patient. She is requesting SNF placement. CSW discussed insurance authorization process and will provide Medicare SNF ratings list. CSW will send out referrals for review and provide bed offers as available. Barriers include insurance type and need for new Dialysis transport. She requested to see if Financial Counseling can assist with checking on the status of patient's Disability application as patient will not be able to work as she was prior to admission. CSW sent request for review.    Skilled Nursing Rehab Facilities-   [email or web address]   Ratings out of 5 stars (5 the highest)  Name Address  Phone # Quality Care Staffing Health Inspection Overall  [facility] [address], [place] [phone] 2 1 2 1   [facility] [address] [place] [phone] 5 2 4 5   [facility] [address], [facility] [phone] [facility] [address] [place] [phone] 4 3 4 4   [facility]  [address], [place] [phone] 4 3 5 5   [facility] [address], [facility] [phone] 5 3 2 3   [facility] [address], [place] [phone] 5 1 2 2   [facility] [address], [place] [phone] 3 4 4 4   [address] ([facility]) [address], [place] [phone] 1 3 3 2   [facility] [address] [place], [place] [phone] 4 2  2 2   [facility] ([place]) [address], [place] [phone] 2 1 1 1   Clotilda Pereyra [address] [place] [phone] 2 4 4 4   [facility] [address], [place] [phone] 3 1 3 2   [facility] ([facility]) [address], [place] [phone] 1 2 4 3           [facility] [address] [place] [phone] 3 1 5 4   [facility] [address] [place] [phone] 4 2 1 1   [facility]  [address] [place] [phone] 2 4 1 1   [facility] [place] [address] [phone] 2 2 5 5   [facility] [address] [place] [address], [place] [phone] 2 2 3 3           [facility] [address], [place] [phone] 2 1 2 1   [facility] (No U[facility]) [address], [place] [phone] 4 3 4 4   [facility] [address], [facility] [phone] 3  5 5   [facility] [address] [place] [phone] 4 2 1 1   [place] [address] [place] [phone] 3 1 2 1   [facility] [address], [place] [phone] 1 3 3 2   [facility] [address], [place] [phone] 2 2 3 3   [facility] [address] [place], [place] [phone] 2 1 4 3   [facility] [address], [place] [phone] 2  1 1 1   [facility] [address] [place] [phone] 2 1 2 1   [facility] [address] [place] [phone] 3 1 2 1           [facility] [address] [place] [phone] 4 1 3 2   [facility] [address], [place]  [phone] 2 4 4 4   [facility] (No Humana) [address], [place] [phone] 3 2 5 5   [facility]) [address], [place] [phone] 3 2 3 3   [facility] [address], [place] [phone] 5 3 5 5   [facility] [address], [place] [phone] 2 1 1 1   [facility] [address]  [place], [place] [phone] 3 5 5 5           [facility] [address] [place], [place] [phone] 5 4 5 5   [facility])  [address], [place] [phone] 1 1 2 1   [facility] [address] [place] [phone]  2 4 4   [facility] [address], [place] [phone] 3 5 5 5   [address] [place], [place] [phone] 4 2 2 2   [facility]) [address] [place] [phone] 1 1 3 1   [facility] [address] [place] [phone] 2 2 2 2      Expected Discharge Plan: Skilled Nursing Facility Barriers to Discharge: Continued Medical Work up, English as a second language teacher, SNF Pending bed offer (HD center coordination with SNF)   Patient Goals and CMS Choice Patient states their goals for this hospitalization and ongoing recovery are:: Rehab CMS Medicare.gov Compare Post Acute Care list provided to:: Patient Represenative (must comment) Choice offered to / list presented to : Adult Children [place] ownership interest in [facility].provided to:: Adult Children    Expected Discharge Plan and Services In-house Referral: Clinical Social Work   Post Acute Care Choice: Skilled Nursing Facility Living arrangements for the past 2 months: Apartment                                      Prior Living Arrangements/Services Living arrangements for the past 2 months: Apartment Lives with:: Adult Children Patient language and need for interpreter reviewed:: Yes Do you feel safe going back to the place where you live?: Yes      Need for Family Participation in Patient Care: Yes (Comment) Care giver support system in place?: Yes (comment)   Criminal Activity/Legal Involvement Pertinent to Current Situation/Hospitalization: No - Comment as needed  Activities of Daily Living      Permission Sought/Granted Permission sought to share information with :  Facility Medical sales representative, Family Supports Permission granted to share information with : Yes, Verbal Permission Granted  Share Information with NAME: [place], Ringer- Daughter   [phone]  Permission granted to share info w AGENCY: SNFs        Emotional Assessment Appearance:: Appears stated age Attitude/Demeanor/Rapport: Unable to Assess Affect (typically observed): Unable to Assess Orientation: : Oriented to Self, Oriented to Place, Oriented to Situation Alcohol / Substance Use: Not Applicable Psych Involvement: No (comment)  Admission diagnosis:  Renal dysfunction [N28.9] CKD (chronic kidney disease) stage 5, GFR less than 15 ml/min (HCC) [N18.5] Patient Active Problem List   Diagnosis Date Noted   CKD (chronic kidney disease) stage 5, GFR  less than 15 ml/min (HCC) 10/21/2023   Hyponatremia 10/21/2023   Hypokalemia 10/21/2023   Anemia 10/21/2023   Dizziness 10/21/2023   Acute respiratory failure with hypoxia (HCC) 07/12/2023   History of status epilepticus 07/12/2023   Acute on chronic diastolic CHF (congestive heart failure) (HCC) 07/12/2023   Murmur, cardiac 04/02/2023   Hypertension    CKD (chronic kidney disease) stage 4, GFR 15-29 ml/min (HCC)    Seizure disorder (HCC)    Acute renal failure superimposed on stage 5 chronic kidney disease, not on chronic dialysis (HCC) 09/01/2020   Hemoglobin A1c less than 7.0% 11/26/2018   Hyperglycemia 11/26/2018   Proteinuria 11/26/2018   Joint pain in fingers of both hands 08/03/2018   Bilateral lower extremity edema 05/03/2018   Uncontrolled type 2 diabetes mellitus with hyperglycemia, with long-term current use of insulin  (HCC) 01/31/2018   Age-related cataract of both eyes 01/31/2018   PCP:  Shelda Atlas, MD Pharmacy:   [facility] [address], [place] - [address] [place] [address] Phone: [phone] Fax: [phone]     Social Drivers of Health (SDOH) Social  History: SDOH Screenings   Food Insecurity: No Food Insecurity (10/22/2023)  Housing: Unknown (10/22/2023)  Transportation Needs: Patient Declined (10/22/2023)  Utilities: Patient Declined (10/22/2023)  Depression (PHQ2-9): Low Risk  (02/25/2019)  Tobacco Use: Low Risk  (10/21/2023)   SDOH Interventions:     Readmission Risk Interventions     No data to display

## 2023-10-25 DIAGNOSIS — N185 Chronic kidney disease, stage 5: Secondary | ICD-10-CM | POA: Diagnosis not present

## 2023-10-25 LAB — GLUCOSE, CAPILLARY
Glucose-Capillary: 108 mg/dL — ABNORMAL HIGH (ref 70–99)
Glucose-Capillary: 118 mg/dL — ABNORMAL HIGH (ref 70–99)
Glucose-Capillary: 158 mg/dL — ABNORMAL HIGH (ref 70–99)
Glucose-Capillary: 186 mg/dL — ABNORMAL HIGH (ref 70–99)
Glucose-Capillary: 215 mg/dL — ABNORMAL HIGH (ref 70–99)
Glucose-Capillary: 309 mg/dL — ABNORMAL HIGH (ref 70–99)

## 2023-10-25 LAB — CBC
HCT: 24.5 % — ABNORMAL LOW (ref 36.0–46.0)
Hemoglobin: 8 g/dL — ABNORMAL LOW (ref 12.0–15.0)
MCH: 26.4 pg (ref 26.0–34.0)
MCHC: 32.7 g/dL (ref 30.0–36.0)
MCV: 80.9 fL (ref 80.0–100.0)
Platelets: 197 K/uL (ref 150–400)
RBC: 3.03 MIL/uL — ABNORMAL LOW (ref 3.87–5.11)
RDW: 14.9 % (ref 11.5–15.5)
WBC: 8.4 K/uL (ref 4.0–10.5)
nRBC: 0 % (ref 0.0–0.2)

## 2023-10-25 LAB — BASIC METABOLIC PANEL WITH GFR
Anion gap: 14 (ref 5–15)
BUN: 69 mg/dL — ABNORMAL HIGH (ref 8–23)
CO2: 24 mmol/L (ref 22–32)
Calcium: 8.2 mg/dL — ABNORMAL LOW (ref 8.9–10.3)
Chloride: 92 mmol/L — ABNORMAL LOW (ref 98–111)
Creatinine, Ser: 6.6 mg/dL — ABNORMAL HIGH (ref 0.44–1.00)
GFR, Estimated: 7 mL/min — ABNORMAL LOW (ref >60)
Glucose, Bld: 178 mg/dL — ABNORMAL HIGH (ref 70–99)
Potassium: 3.8 mmol/L (ref 3.5–5.1)
Sodium: 130 mmol/L — ABNORMAL LOW (ref 135–145)

## 2023-10-25 MED ORDER — TRAMADOL HCL 50 MG PO TABS
50.0000 mg | ORAL_TABLET | Freq: Three times a day (TID) | ORAL | Status: DC | PRN
  Administered 2023-10-25: 50 mg via ORAL
  Filled 2023-10-25: qty 1

## 2023-10-25 MED ORDER — ENSURE PLUS HIGH PROTEIN PO LIQD
237.0000 mL | Freq: Three times a day (TID) | ORAL | Status: DC
  Administered 2023-10-25 – 2023-10-30 (×11): 237 mL via ORAL

## 2023-10-25 MED ORDER — HEPARIN SODIUM (PORCINE) 1000 UNIT/ML IJ SOLN
INTRAMUSCULAR | Status: AC
  Filled 2023-10-25: qty 4

## 2023-10-25 MED ORDER — DARBEPOETIN ALFA 60 MCG/0.3ML IJ SOSY
60.0000 ug | PREFILLED_SYRINGE | INTRAMUSCULAR | Status: DC
  Administered 2023-10-25: 60 ug via SUBCUTANEOUS
  Filled 2023-10-25: qty 0.3

## 2023-10-25 MED ORDER — IRON SUCROSE 20 MG/ML IV SOLN
200.0000 mg | Freq: Once | INTRAVENOUS | Status: AC
  Administered 2023-10-25: 200 mg via INTRAVENOUS
  Filled 2023-10-25: qty 10

## 2023-10-25 MED ORDER — HYDROCODONE-ACETAMINOPHEN 5-325 MG PO TABS
1.0000 | ORAL_TABLET | Freq: Four times a day (QID) | ORAL | Status: DC | PRN
Start: 2023-10-25 — End: 2023-10-28
  Administered 2023-10-25 – 2023-10-28 (×9): 1 via ORAL
  Filled 2023-10-25 (×9): qty 1

## 2023-10-25 MED ORDER — RENA-VITE PO TABS
1.0000 | ORAL_TABLET | Freq: Every day | ORAL | Status: DC
Start: 2023-10-25 — End: 2023-10-30
  Administered 2023-10-25 – 2023-10-29 (×5): 1 via ORAL
  Filled 2023-10-25 (×5): qty 1

## 2023-10-25 MED ORDER — ORAL CARE MOUTH RINSE: 15.0000 mL | OROMUCOSAL | Status: DC | PRN

## 2023-10-25 NOTE — Progress Notes (Signed)
 Received patient in bed.Alert and oriented x 4.Consent verified.  Access used: Right hd catheter that worked well.Dressing on date.  Duration of treatment: 3  ours.  Uf goal. Even.  Medicine given: Venofer  200 mg.  Hand off to the patient's nurse,back into her room with stable condition via transporter.

## 2023-10-25 NOTE — Progress Notes (Signed)
 Patient ID: Jill Jefferson, female   DOB: 1960/03/26, 63 y.o.   MRN: [id number] S: No new complaints O:BP (!) 147/70 (BP Location: Right Arm)   Pulse 73   Temp 99.1 F (37.3 C) (Oral)   Resp 13   Ht 5' (1.524 m)   Wt 51.5 kg   SpO2 99%   BMI 22.17 kg/m   Intake/Output Summary (Last 24 hours) at 10/25/2023 1137 Last data filed at 10/24/2023 1800 Gross per 24 hour  Intake --  Output 400 ml  Net -400 ml   Intake/Output: I/O last 3 completed shifts: In: -  Out: 1200 [Urine:1150; Emesis/NG output:50]  Intake/Output this shift:  No intake/output data recorded. Weight change: -0.9 kg Gen: NAD CVS: RRR Resp:CTA Abd: +BS, soft, TN/ND Ext: no edema  Recent Labs  Lab 10/21/23 1652 10/22/23 0755 10/22/23 2327 10/24/23 0428 10/25/23 0414  NA 125* 131* 127* 130* 130*  K 3.3* 2.8* 3.5 3.7 3.8  CL 70* 76* 73* 86* 92*  CO2 29 34* 31 30 24   GLUCOSE 282* 57* 229* 95 178*  BUN 233* 230* 244* 122* 69*  CREATININE 14.00* 15.10* 14.69* 9.46* 6.60*  ALBUMIN 3.6  --  2.3*  --   --   CALCIUM  7.7* 7.7* 7.3* 8.3* 8.2*  AST 23  --  18  --   --   ALT 29  --  21  --   --    Liver Function Tests: Recent Labs  Lab 10/21/23 1652 10/22/23 2327  AST 23 18  ALT 29 21  ALKPHOS 118 69  BILITOT 0.4 1.0  PROT 7.3 6.4*  ALBUMIN 3.6 2.3*   Recent Labs  Lab 10/21/23 1652 10/24/23 0428  LIPASE 463* 94*   No results for input(s): AMMONIA in the last 168 hours. CBC: Recent Labs  Lab 10/21/23 1652 10/22/23 0755 10/22/23 2327 10/24/23 0428 10/25/23 0414  WBC 8.7 7.6 8.7 9.4 8.4  NEUTROABS  --   --  7.0  --   --   HGB 7.5* 6.6* 8.7* 9.0* 8.0*  HCT 23.6* 20.6* 25.5* 27.5* 24.5*  MCV 78.9* 78.6* 76.6* 79.5* 80.9  PLT 209 209 199 208 197   Cardiac Enzymes: No results for input(s): CKTOTAL, CKMB, CKMBINDEX, TROPONINI in the last 168 hours. CBG: Recent Labs  Lab 10/24/23 1820 10/24/23 2004 10/25/23 0049 10/25/23 0508 10/25/23 0915  GLUCAP 83 122* 108* 158* 186*     Iron  Studies: No results for input(s): IRON , TIBC, TRANSFERRIN, FERRITIN in the last 72 hours. Studies/Results: No results found.  carvedilol   6.25 mg Oral BID WC   Chlorhexidine  Gluconate Cloth  6 each Topical Q0600   feeding supplement (NEPRO CARB STEADY)  237 mL Oral TID BM   heparin  sodium (porcine)  10 mL Intracatheter Once   insulin  aspart  0-5 Units Subcutaneous QHS   insulin  aspart  0-9 Units Subcutaneous TID WC   levETIRAcetam   500 mg Oral BID   lidocaine-EPINEPHrine  20 mL Infiltration Once    BMET    Component Value Date/Time   NA 130 (L) 10/25/2023 0414   NA 139 06/23/2019 1208   K 3.8 10/25/2023 0414   CL 92 (L) 10/25/2023 0414   CO2 24 10/25/2023 0414   GLUCOSE 178 (H) 10/25/2023 0414   BUN 69 (H) 10/25/2023 0414   BUN 27 (H) 06/23/2019 1208   CREATININE 6.60 (H) 10/25/2023 0414   CREATININE 3.48 (H) 06/08/2021 0000   CALCIUM  8.2 (L) 10/25/2023 0414   GFRNONAA 7 (L) 10/25/2023  0414   GFRAA 39 (L) 06/23/2019 1208   CBC    Component Value Date/Time   WBC 8.4 10/25/2023 0414   RBC 3.03 (L) 10/25/2023 0414   HGB 8.0 (L) 10/25/2023 0414   HGB 11.1 06/23/2019 1208   HCT 24.5 (L) 10/25/2023 0414   HCT 33.2 (L) 06/23/2019 1208   PLT 197 10/25/2023 0414   PLT 257 06/23/2019 1208   MCV 80.9 10/25/2023 0414   MCV 81 06/23/2019 1208   MCH 26.4 10/25/2023 0414   MCHC 32.7 10/25/2023 0414   RDW 14.9 10/25/2023 0414   RDW 12.4 06/23/2019 1208   LYMPHSABS 0.8 10/22/2023 2327   LYMPHSABS 1.8 06/23/2019 1208   MONOABS 0.7 10/22/2023 2327   EOSABS 0.1 10/22/2023 2327   EOSABS 0.1 06/23/2019 1208   BASOSABS 0.0 10/22/2023 2327   BASOSABS 0.0 06/23/2019 1208    Assessment 27F with historical CKD 5 now progressing to uremia, significant azotemia.   Uremia with malaise, neurological symptoms, GI symptoms and severe azotemia CKD 5, now reasonable to declare ESRD Microcytic anemia Mild hypokalemia Increased anion gap metabolic acidosis with metabolic  alkalosis Seizure disorder  Hypertension CKD-BMD   Plan She is presenting with progressive uremia and this is the time to start dialysis.  At length discussed this with patient and daughter.  She finally decided to proceed with dialysis and had RIJ TDC placed by RI followed by short HD sessionon 10/23/23.  Plan for ongoing serial HD which will be progressively longer and faster to prevent dialysis dysequilibrium syndrome.  She is high risk given her advanced uremia and significant azotemia.  Appreciate efforts of renal navigator to arrange for outpatient dialysis.  Will also need to consult [facility] for eventual access placement once more stable.  Plan for 3rd HD session today. Will continue with ongoing education and discuss home modalities but daughter expresses some concerns about her suitability to do this.  This could all be sorted out and something like her transitional care unit if she does begin dialysis.  Extensive discussion with the patient regarding goals of care. Would not replace potassium Can give some gentle hydration in case she is hypovolemic with her poor oral intake and nausea/vomiting TSAT 13%, will start ESA and follow as well as IV venofer  200 mg RIJ TDC placed by IR on 10/23/23 Will check phosphorus and iPTH.  Fairy RONAL Sellar, MD [facility]

## 2023-10-25 NOTE — Progress Notes (Signed)
 Initial Nutrition Assessment  DOCUMENTATION CODES:  Non-severe (moderate) malnutrition in context of chronic illness  INTERVENTION:  Renal Multivitamin w/ minerals daily Diet education Liberalize diet to regular due to ongoing poor PO intake, continue 1200 mL fluid restriction Encourage good PO intake Meal ordering with assist Ensure Plus High Protein po TID, each supplement provides 350 kcal and 20 grams of protein  NUTRITION DIAGNOSIS:  Moderate Malnutrition related to chronic illness as evidenced by percent weight loss, moderate muscle depletion.  GOAL:  Patient will meet greater than or equal to 90% of their needs  MONITOR:  PO intake, Supplement acceptance, I & O's, Labs  REASON FOR ASSESSMENT:  Consult Assessment of nutrition requirement/status, Diet education  ASSESSMENT:  63 y.o. female presented to the ED with dizziness, weakness, N/V, and chest tightness for 1 month. PMH includes CKD V, CHF, seizure, and T2DM. Pt admitted with progression of CKD V.  10/13 - Admitted 10/14 - [facility] HD 10/15 - s/p TDC placement  RD initially stopped when pt was working with OT and having vomiting, RD gave diet education to daughter then. RD later returned, pt laying in bed and son at bedside. Vernadine provides history that pt lives with daughter and he had come into town from [place] . Son shared that she has not been eating well and skipping meals. Reports weight loss as well, but unsure how much. RD reviewed diet education with son and he had already read information provided by Dialysis Coordinator.   Reports that pt has not been eating well since admission. RD observed pt lunch tray at bedside untouched. Ensure open and son reports that pt has almost finished that one and enjoys them. RD discussed continuing the use of supplements while admitted to help with PO intake and ensure adequate nutrition during times of poor PO.   Son denies any questions at this time, RD  encourage family to ask for RD to return as needed or if they have any questions.   Per chart review, pt with 16% weight loss within the past 3 months. This is clinically significant for time frame.   Admission Weight: 61.2 kg (appears stated) Current Weight: 51.5 kg (10/16)   Nutrition Related Medications: Novolog  0-5 units daily + 0-9 units TID Labs: Sodium 130, Potassium 3.8, Hgb A1c 9.1  CBG: 83-186 mg/dL x 24 hrs   UOP: 599 mL x 24 hrs Net UF: 0 mL on 10/16  NUTRITION - FOCUSED PHYSICAL EXAM: Flowsheet Row Most Recent Value  Orbital Region No depletion  Upper Arm Region No depletion  Thoracic and Lumbar Region No depletion  Buccal Region No depletion  Temple Region No depletion  Clavicle Bone Region Mild depletion  Clavicle and Acromion Bone Region Mild depletion  Scapular Bone Region Moderate depletion  Dorsal Hand No depletion  Patellar Region Moderate depletion  Anterior Thigh Region Severe depletion  Posterior Calf Region Moderate depletion  Edema (RD Assessment) None  Hair Reviewed  Eyes Unable to assess  Mouth Unable to assess  Skin Reviewed  Nails Reviewed   Diet Order:   Diet Order             Diet regular Room service appropriate? Yes with Assist; Fluid consistency: Thin; Fluid restriction: 1200 mL Fluid  Diet effective now                  EDUCATION NEEDS:  Education needs have been addressed  Skin:  Skin Assessment: Reviewed RN Assessment  Last BM:  10/16  Height:  Ht Readings from Last 1 Encounters:  10/21/23 5' (1.524 m)   Weight:  Wt Readings from Last 1 Encounters:  10/24/23 51.5 kg   Ideal Body Weight:  45.5 kg  BMI:  Body mass index is 22.17 kg/m.  Estimated Nutritional Needs:  Kcal:  1700-1900 Protein:  85-105 grams Fluid:  UOP + 1L   Nestora Glatter RD, LDN Clinical Dietitian

## 2023-10-25 NOTE — Plan of Care (Signed)
  Problem: Coping: Goal: Ability to adjust to condition or change in health will improve Outcome: Progressing   Problem: Metabolic: Goal: Ability to maintain appropriate glucose levels will improve Outcome: Progressing   Problem: Nutritional: Goal: Maintenance of adequate nutrition will improve Outcome: Progressing   Problem: Education: Goal: Knowledge of General Education information will improve Description: Including pain rating scale, medication(s)/side effects and non-pharmacologic comfort measures Outcome: Progressing   Problem: Clinical Measurements: Goal: Diagnostic test results will improve Outcome: Progressing

## 2023-10-25 NOTE — Progress Notes (Signed)
 [facility] PROGRESS NOTE    Progress Note  Jill Jefferson  FMW:[id number] DOB: [birth year] DOA: 10/21/2023 PCP: Shelda Atlas, MD     Brief Narrative:   Jill Jefferson is an 63 y.o. female past medical history significant for chronic kidney disease stage V, seizure disorder, HFrEF with a last EF of 40%, chronic kidney disease stage V, insulin -dependent diabetes mellitus type 2 recently discharged from the hospital on October 2025 for heart failure exacerbation and acute kidney injury, during that time she was hesitant to start hemodialysis, she was discharged on high-dose diuretic therapy comes in for dizziness generalized progressive weakness nausea vomiting and intermittent chest tightness, her workup was significant for progressive renal failure, started on hemodialysis.   Assessment/Plan:   CKD stage V, new ESRD  Uremia  Hypokalemia  - Renal input greatly appreciated, palliative care input greatly appreciated, patient decided HD trial . - HD access by IR 10/15, started HD on 10/15 another HD at 10/16 - Further management per renal - Uremia symptoms improving, she had some nausea and vomiting yesterday before dialysis, reports appetite has improved, less nausea.  Anemia of chronic renal disease/microcytic anemia: - no signs of overt bleeding. - Improved after receiving 1 unit PRBC .   - IV iron  per renal   Hyponatremia - Improving with dialysis  Elevated lipase: Denies any abdominal pain CT scan showed no acute findings. Continue antiemetics. - Trending down  Elevated troponins: In the setting of chronic renal disease Twelve-lead EKG showed no evidence of ischemia.  Incidental adrenal nodule: CT scan of the abdomen pelvis showed 16mm right adrenal nodule. Will need further imaging for characterization.  Essential hypertension: Continue Coreg  holding diuretics. Use hydralazine  as needed.  Seizure disorder: Continue Keppra .  Chronic HFrEF: Last EF showed  a 2D echo 40%. Chest x-ray showed no pulmonary edema. - Volume management with HD  Insulin -dependent diabetes mellitus type 2: Controlled with last A1c 9.1 But she remains with poor appetite, so holding long-acting insulin , continue with sensitive insulin  sliding scale.  Patient requesting some pain medicine for chronic bilateral hip pain, CT abdomen pelvis on admission with no acute findings, she was on tramadol, will change to Vicodin now she started HD, PT/OT consulted    DVT prophylaxis:  subcu heparin   Family Communication: None at bedside Status is: Inpatient Remains inpatient appropriate because: CKD V    Code Status:     Code Status Orders  (From admission, onward)           Start     Ordered   10/21/23 2254  Full code  Continuous       Question:  By:  Answer:  Consent: discussion documented in EHR   10/21/23 2255           Code Status History     Date Active Date Inactive Code Status Order ID Comments User Context   07/12/2023 1559 07/18/2023 1649 Full Code [id number]  Seena Marsa [name], MD Inpatient   05/10/2021 2124 05/14/2021 0106 Full Code [id number]  Marcene Eva [name], DO ED   09/01/2020 2043 09/08/2020 0017 Full Code [id number]  Seena Marsa [name], MD ED         IV Access:   Peripheral IV   Procedures and diagnostic studies:   IR TUNNELED CENTRAL VENOUS CATH [facility] W IMG Result Date: 10/23/2023 INDICATION: ESRD requiring HD initiation. EXAM: TUNNELED CENTRAL VENOUS HEMODIALYSIS CATHETER PLACEMENT WITH ULTRASOUND AND FLUOROSCOPIC GUIDANCE MEDICATIONS: Ancef 2 gm IV. The antibiotic was given in an appropriate  time interval prior to skin puncture. ANESTHESIA/SEDATION: Moderate (conscious) sedation was employed during this procedure. A total of Versed  1 mg and Fentanyl 50 mcg was administered intravenously. Moderate Sedation Time: 15 minutes. The patient's level of consciousness and vital signs were monitored continuously by radiology nursing throughout the  procedure under my direct supervision. FLUOROSCOPY: Radiation Exposure Index and estimated peak skin dose (PSD); Reference air kerma (RAK), 0.1 mGy. COMPLICATIONS: None immediate. PROCEDURE: Informed written consent was obtained from the patient after a discussion of the risks, benefits, and alternatives to treatment. Questions regarding the procedure were encouraged and answered. The RIGHT neck and chest were prepped with chlorhexidine  in a sterile fashion, and a sterile drape was applied covering the operative field. Maximum barrier sterile technique with sterile gowns and gloves were used for the procedure. A timeout was performed prior to the initiation of the procedure. After creating a small venotomy incision, a micropuncture kit was utilized to access the internal jugular vein. Real-time ultrasound guidance was utilized for vascular access including the acquisition of a permanent ultrasound image documenting patency of the accessed vessel. The microwire was utilized to measure appropriate catheter length. A stiff Glidewire was advanced to the level of the IVC and the micropuncture sheath was exchanged for a peel-away sheath. A palindrome tunneled hemodialysis catheter measuring 19 cm from tip to cuff was tunneled in a retrograde fashion from the anterior chest wall to the venotomy incision. The catheter was then placed through the peel-away sheath with tips ultimately positioned within the superior aspect of the right atrium. Final catheter positioning was confirmed and documented with a spot radiographic image. The catheter aspirates and flushes normally. The catheter was flushed with appropriate volume heparin  dwells. The catheter exit site was secured with a 2-0 Ethilon retention suture. The venotomy incision was closed with Dermabond. Dressings were applied. The patient tolerated the procedure well without immediate post procedural complication. IMPRESSION: Successful placement of 19 cm tip to cuff  tunneled hemodialysis catheter via the RIGHT internal jugular vein. The tip of the catheter is positioned within the proximal RIGHT atrium. The catheter is ready for immediate use. Thom Hall, MD [facility] Electronically Signed   By: Thom Hall M.D.   On: 10/23/2023 14:05     Medical Consultants:   Nephrology Interventional radiology   Subjective:    Jill Jefferson reports chronic bilateral hip pain, requesting pain medicine, had nausea and vomiting yesterday before dialysis, but some nausea, no further vomiting, appetite remains poor but improving  Objective:    Vitals:   10/24/23 2003 10/25/23 0048 10/25/23 0507 10/25/23 0800  BP: (!) 134/56   (!) 147/70  Pulse:    73  Resp:    13  Temp: 98.5 F (36.9 C) 99 F (37.2 C) 99.2 F (37.3 C) 99.1 F (37.3 C)  TempSrc: Oral Oral Oral Oral  SpO2:    99%  Weight:      Height:       SpO2: 99 % O2 Flow Rate (L/min): 2 L/min   Intake/Output Summary (Last 24 hours) at 10/25/2023 1037 Last data filed at 10/24/2023 1800 Gross per 24 hour  Intake --  Output 450 ml  Net -450 ml   Filed Weights   10/23/23 1436 10/23/23 1657 10/24/23 1350  Weight: 52.4 kg 52.6 kg 51.5 kg    Exam:   Awake Alert, frail, deconditioned Symmetrical Chest wall movement, Good air movement bilaterally RRR,No Gallops,Rubs or new Murmurs, No Parasternal Heave +  ve B.Sounds, Abd Soft, No tenderness No Cyanosis, Clubbing or edema, No new Rash or bruise       Data Reviewed:    Labs: Basic Metabolic Panel: Recent Labs  Lab 10/21/23 1652 10/22/23 0755 10/22/23 2327 10/24/23 0428 10/25/23 0414  NA 125* 131* 127* 130* 130*  K 3.3* 2.8* 3.5 3.7 3.8  CL 70* 76* 73* 86* 92*  CO2 29 34* 31 30 24   GLUCOSE 282* 57* 229* 95 178*  BUN 233* 230* 244* 122* 69*  CREATININE 14.00* 15.10* 14.69* 9.46* 6.60*  CALCIUM  7.7* 7.7* 7.3* 8.3* 8.2*  MG  --   --  2.2  --   --    GFR Estimated  Creatinine Clearance: 6.4 mL/min (A) (by C-G formula based on SCr of 6.6 mg/dL (H)). Liver Function Tests: Recent Labs  Lab 10/21/23 1652 10/22/23 2327  AST 23 18  ALT 29 21  ALKPHOS 118 69  BILITOT 0.4 1.0  PROT 7.3 6.4*  ALBUMIN 3.6 2.3*   Recent Labs  Lab 10/21/23 1652 10/24/23 0428  LIPASE 463* 94*   No results for input(s): AMMONIA in the last 168 hours. Coagulation profile No results for input(s): INR, PROTIME in the last 168 hours. COVID-19 Labs  No results for input(s): DDIMER, FERRITIN, LDH, CRP in the last 72 hours.   Lab Results  Component Value Date   SARSCOV2NAA NEGATIVE 07/11/2023   SARSCOV2NAA NEGATIVE 09/01/2020    CBC: Recent Labs  Lab 10/21/23 1652 10/22/23 0755 10/22/23 2327 10/24/23 0428 10/25/23 0414  WBC 8.7 7.6 8.7 9.4 8.4  NEUTROABS  --   --  7.0  --   --   HGB 7.5* 6.6* 8.7* 9.0* 8.0*  HCT 23.6* 20.6* 25.5* 27.5* 24.5*  MCV 78.9* 78.6* 76.6* 79.5* 80.9  PLT 209 209 199 208 197   Cardiac Enzymes: No results for input(s): CKTOTAL, CKMB, CKMBINDEX, TROPONINI in the last 168 hours. BNP (last 3 results) Recent Labs    07/11/23 1625  PROBNP 33,608.0*   CBG: Recent Labs  Lab 10/24/23 1820 10/24/23 2004 10/25/23 0049 10/25/23 0508 10/25/23 0915  GLUCAP 83 122* 108* 158* 186*   D-Dimer: No results for input(s): DDIMER in the last 72 hours. Hgb A1c: No results for input(s): HGBA1C in the last 72 hours.  Lipid Profile: No results for input(s): CHOL, HDL, LDLCALC, TRIG, CHOLHDL, LDLDIRECT in the last 72 hours. Thyroid  function studies: No results for input(s): TSH, T4TOTAL, T3FREE, THYROIDAB in the last 72 hours.  Invalid input(s): FREET3 Anemia work up: No results for input(s): VITAMINB12, FOLATE, FERRITIN, TIBC, IRON , RETICCTPCT in the last 72 hours.  Sepsis Labs: Recent Labs  Lab 10/22/23 0755 10/22/23 2327 10/24/23 0428 10/25/23 0414  WBC 7.6 8.7 9.4 8.4    Microbiology No results found for this or any previous visit (from the past 240 hours).   Medications:    carvedilol   6.25 mg Oral BID WC   Chlorhexidine  Gluconate Cloth  6 each Topical Q0600   feeding supplement (NEPRO CARB STEADY)  237 mL Oral TID BM   heparin  sodium (porcine)  10 mL Intracatheter Once   insulin  aspart  0-5 Units Subcutaneous QHS   insulin  aspart  0-9 Units Subcutaneous TID WC   levETIRAcetam   500 mg Oral BID   lidocaine-EPINEPHrine  20 mL Infiltration Once   Continuous Infusions:      LOS: 4 days   Jill Jefferson  [facility]  10/25/2023, 10:37 AM

## 2023-10-25 NOTE — Progress Notes (Signed)
 Attempted to see patient x2 to discuss New Start Dialysis Education Booklet.  1st time patient was working with OT.  Returned to bedside approximately 30 minutes later.  Patient is very sleepy and stated that family members had stepped away.  She wanted family members to be present since she was so tired.  Left education packet as requested with patient at bedside for family members.   Will continue to round on patient during admission.    Alfonso Sar Dialysis Nurse Coordinator [phone] [email or web address]

## 2023-10-25 NOTE — Plan of Care (Signed)
  Problem: Education: Goal: Ability to describe self-care measures that may prevent or decrease complications (Diabetes Survival Skills Education) will improve Outcome: Progressing Goal: Individualized Educational Video(s) Outcome: Progressing   Problem: Coping: Goal: Ability to adjust to condition or change in health will improve Outcome: Progressing   Problem: Fluid Volume: Goal: Ability to maintain a balanced intake and output will improve Outcome: Progressing   Problem: Health Behavior/Discharge Planning: Goal: Ability to identify and utilize available resources and services will improve Outcome: Progressing Goal: Ability to manage health-related needs will improve Outcome: Progressing   Problem: Metabolic: Goal: Ability to maintain appropriate glucose levels will improve Outcome: Progressing   Problem: Nutritional: Goal: Maintenance of adequate nutrition will improve Outcome: Progressing Goal: Progress toward achieving an optimal weight will improve Outcome: Progressing   Problem: Skin Integrity: Goal: Risk for impaired skin integrity will decrease Outcome: Progressing   Problem: Tissue Perfusion: Goal: Adequacy of tissue perfusion will improve Outcome: Progressing   Problem: Education: Goal: Knowledge of General Education information will improve Description: Including pain rating scale, medication(s)/side effects and non-pharmacologic comfort measures Outcome: Progressing   Problem: Health Behavior/Discharge Planning: Goal: Ability to manage health-related needs will improve Outcome: Progressing   Problem: Clinical Measurements: Goal: Ability to maintain clinical measurements within normal limits will improve Outcome: Progressing Goal: Will remain free from infection Outcome: Progressing Goal: Diagnostic test results will improve Outcome: Progressing Goal: Respiratory complications will improve Outcome: Progressing Goal: Cardiovascular complication will  be avoided Outcome: Progressing   Problem: Activity: Goal: Risk for activity intolerance will decrease Outcome: Progressing   Problem: Nutrition: Goal: Adequate nutrition will be maintained Outcome: Progressing   Problem: Coping: Goal: Level of anxiety will decrease Outcome: Progressing   Problem: Elimination: Goal: Will not experience complications related to bowel motility Outcome: Progressing Goal: Will not experience complications related to urinary retention Outcome: Progressing   Problem: Pain Managment: Goal: General experience of comfort will improve and/or be controlled Outcome: Progressing   Problem: Safety: Goal: Ability to remain free from injury will improve Outcome: Progressing   Problem: Skin Integrity: Goal: Risk for impaired skin integrity will decrease Outcome: Progressing   Problem: Education: Goal: Knowledge of disease and its progression will improve Outcome: Progressing Goal: Individualized Educational Video(s) Outcome: Progressing   Problem: Fluid Volume: Goal: Compliance with measures to maintain balanced fluid volume will improve Outcome: Progressing   Problem: Health Behavior/Discharge Planning: Goal: Ability to manage health-related needs will improve Outcome: Progressing   Problem: Nutritional: Goal: Ability to make healthy dietary choices will improve Outcome: Progressing   Problem: Clinical Measurements: Goal: Complications related to the disease process, condition or treatment will be avoided or minimized Outcome: Progressing

## 2023-10-25 NOTE — Plan of Care (Signed)
  Problem: Education: Goal: Ability to describe self-care measures that may prevent or decrease complications (Diabetes Survival Skills Education) will improve Outcome: Progressing   Problem: Skin Integrity: Goal: Risk for impaired skin integrity will decrease Outcome: Progressing   Problem: Pain Managment: Goal: General experience of comfort will improve and/or be controlled Outcome: Progressing   Problem: Safety: Goal: Ability to remain free from injury will improve Outcome: Progressing

## 2023-10-25 NOTE — Progress Notes (Signed)
 Contacted by CSW with clinics that snf will transport pt to at d/c. Referral submitted to [facility] admissions with request for [place], [place], [place], or [place]. Will await determination and assist accordingly.   Randine Mungo Dialysis Navigator [phone]

## 2023-10-25 NOTE — TOC Progression Note (Addendum)
 Transition of Care [facility]) - Progression Note    Patient Details  Name: Jill Jefferson MRN: [id number] Date of Birth: [birth year]  [facility] CM/SW Contact  Inocente GORMAN Kindle, LCSW Phone Number: 10/25/2023, 9:26 AM  Clinical Narrative:    9:26am-Only [facility] bed offer at this time is [place]. CSW requested facility confirm which dialysis centers they can transport to.   CSW sent options of HD centers from [place] to Renal Navigator.  Per Renal Navigator only HD offer is at [facility]. Per [facility] they may not be able to accommodate that center but cannot confirm until Monday.   Provided letter signed by MD to patient's daughter, Byron, so she can submit to patient's apartment.    Expected Discharge Plan: Skilled Nursing Facility Barriers to Discharge: Continued Medical Work up, English as a second language teacher, SNF Pending bed offer (HD center coordination with SNF)               Expected Discharge Plan and Services In-house Referral: Clinical Social Work   Post Acute Care Choice: Skilled Nursing Facility Living arrangements for the past 2 months: Apartment                                       Social Drivers of Health (SDOH) Interventions SDOH Screenings   Food Insecurity: No Food Insecurity (10/22/2023)  Housing: Unknown (10/22/2023)  Transportation Needs: Patient Declined (10/22/2023)  Utilities: Patient Declined (10/22/2023)  Depression (PHQ2-9): Low Risk  (02/25/2019)  Tobacco Use: Low Risk  (10/21/2023)    Readmission Risk Interventions     No data to display

## 2023-10-25 NOTE — Evaluation (Signed)
 Occupational Therapy Evaluation Patient Details Name: Jill Jefferson MRN: [id number] DOB: Mar 01, 1960 Today's Date: 10/25/2023   History of Present Illness   Pt is a 63 year old female who comes in for dizziness generalized progressive weakness nausea vomiting and intermittent chest tightness, her workup was significant for progressive renal failure, started on hemodialysis. Past medical history significant for chronic kidney disease stage V, seizure disorder, HFrEF with a last EF of 40%, chronic kidney disease stage V, insulin -dependent diabetes mellitus type 2 recently discharged from the hospital on October 2025 for heart failure exacerbation and acute kidney injury, during that time she was hesitant to start hemodialysis, she was discharged on high-dose diuretic therapy.     Clinical Impressions Pt c/o nausea when sitting up, no pain. Pt sister, daughter, and son present during session. Pt lives with daughter who works during the day, 2 story apartment, bed/bath upstairs, 14 steps. PLOFmod I for ADLs, no AD with ambulation. Pt currently requires min A for dressing/bathing, likely not able to tolerate stairs as she took a few steps at bedside and was too fatigued to continue. Pt overall fair strength and ROM, limited due to nausea and fatigue. Recommending postacute rehab <3hrs/day to maximize functional strength and return to PLOF, will continue to see acutely to progress as able. Pt has no DME at home.      If plan is discharge home, recommend the following:   A little help with walking and/or transfers;A little help with bathing/dressing/bathroom;Assistance with cooking/housework;Assist for transportation;Help with stairs or ramp for entrance     Functional Status Assessment   Patient has had a recent decline in their functional status and demonstrates the ability to make significant improvements in function in a reasonable and predictable amount of time.     Equipment  Recommendations   Other (comment) (defer)     Recommendations for Other Services         Precautions/Restrictions   Precautions Precautions: Fall Recall of Precautions/Restrictions: Impaired Restrictions Weight Bearing Restrictions Per Provider Order: No     Mobility Bed Mobility Overal bed mobility: Needs Assistance Bed Mobility: Supine to Sit, Sit to Supine     Supine to sit: Contact guard Sit to supine: Contact guard assist   General bed mobility comments: CGA with HOB elevated, using rails increased time    Transfers Overall transfer level: Needs assistance Equipment used: Rolling walker (2 wheels) Transfers: Sit to/from Stand Sit to Stand: Min assist           General transfer comment: min A for STS and step pivot      Balance Overall balance assessment: Needs assistance Sitting-balance support: No upper extremity supported, Feet supported Sitting balance-Leahy Scale: Fair     Standing balance support: Bilateral upper extremity supported, During functional activity Standing balance-Leahy Scale: Poor Standing balance comment: reliant on RW for support                           ADL either performed or assessed with clinical judgement   ADL Overall ADL's : Needs assistance/impaired Eating/Feeding: Independent   Grooming: Set up;Sitting   Upper Body Bathing: Minimal assistance;Sitting   Lower Body Bathing: Minimal assistance;Sitting/lateral leans;Sit to/from stand   Upper Body Dressing : Minimal assistance;Sitting   Lower Body Dressing: Minimal assistance;Sitting/lateral leans;Sit to/from stand   Toilet Transfer: Minimal assistance;Rolling walker (2 wheels);BSC/3in1   Toileting- Clothing Manipulation and Hygiene: Minimal assistance;Sitting/lateral lean;Sit to/from stand  General ADL Comments: Pt overall min A for dressing/bathing and transfers to [facility], Pt gets nauseous when completing upright activities, not able to  ambulate to toilet, but min A to get to [facility], increased time     Vision Baseline Vision/History: 4 Cataracts;1 Wears glasses Ability to See in Adequate Light: 0 Adequate Patient Visual Report: No change from baseline       Perception         Praxis         Pertinent Vitals/Pain Pain Assessment Pain Assessment: No/denies pain     Extremity/Trunk Assessment Upper Extremity Assessment Upper Extremity Assessment: RUE deficits/detail;LUE deficits/detail RUE Deficits / Details: trigger finger noticeable on multiple fingers, overall WFLs and good strength/ROM LUE Deficits / Details: trigger finger noticeable on multiple fingers, overall WFLs and good strength/ROM           Communication Communication Communication: No apparent difficulties   Cognition Arousal: Alert Behavior During Therapy: Flat affect Cognition: No apparent impairments                               Following commands: Intact       Cueing  General Comments   Cueing Techniques: Verbal cues;Tactile cues  VSS   Exercises     Shoulder Instructions      Home Living Family/patient expects to be discharged to:: Private residence Living Arrangements: Children Available Help at Discharge: Family;Available PRN/intermittently Type of Home: Apartment Home Access: Stairs to enter Entrance Stairs-Number of Steps: 1   Home Layout: Two level;Bed/bath upstairs Alternate Level Stairs-Number of Steps: 14 Alternate Level Stairs-Rails: Right Bathroom Shower/Tub: Chief Strategy Officer: Standard     Home Equipment: None   Additional Comments: Pt lives with daughter who works during the day, 2 story apartment, bed/bath upstairs      Prior Functioning/Environment Prior Level of Function : Needs assist             Mobility Comments: Mostly mod I furniture surfing however daughter would assist ADLs Comments: Daughter assist    OT Problem List: Decreased strength;Decreased  range of motion;Decreased activity tolerance;Impaired balance (sitting and/or standing)   OT Treatment/Interventions: Self-care/ADL training;Therapeutic exercise;Energy conservation;DME and/or AE instruction;Therapeutic activities;Patient/family education;Balance training      OT Goals(Current goals can be found in the care plan section)   Acute Rehab OT Goals Patient Stated Goal: to improve strength OT Goal Formulation: With patient/family Time For Goal Achievement: 11/08/23 Potential to Achieve Goals: Good   OT Frequency:  Min 2X/week    Co-evaluation              AM-PAC OT 6 Clicks Daily Activity     Outcome Measure Help from another person eating meals?: None Help from another person taking care of personal grooming?: A Little Help from another person toileting, which includes using toliet, bedpan, or urinal?: A Little Help from another person bathing (including washing, rinsing, drying)?: A Little Help from another person to put on and taking off regular upper body clothing?: A Little Help from another person to put on and taking off regular lower body clothing?: A Little 6 Click Score: 19   End of Session Equipment Utilized During Treatment: Gait belt;Rolling walker (2 wheels) Nurse Communication: Mobility status  Activity Tolerance: Patient tolerated treatment well Patient left: in bed;with call bell/phone within reach;with family/visitor present  OT Visit Diagnosis: Unsteadiness on feet (R26.81);Other abnormalities of gait and mobility (R26.89);Muscle weakness (  generalized) (M62.81)                Time: 8892-8867 OT Time Calculation (min): 25 min Charges:  OT General Charges $OT Visit: 1 Visit OT Evaluation $OT Eval Moderate Complexity: 1 Mod OT Treatments $Self Care/Home Management : 8-22 mins  [place], OTR/L   Elouise JONELLE Bott 10/25/2023, 11:50 AM

## 2023-10-25 NOTE — Plan of Care (Signed)
 Palliative:  Shadowing chart. Noted tolerating HD so far. Please call [phone] for acute palliative needs and follow up.  No charge  Bernarda Kitty, NP Palliative Medicine Team Pager [phone] (Please see [email or web address] for schedule) Team Phone [phone]

## 2023-10-26 DIAGNOSIS — N185 Chronic kidney disease, stage 5: Secondary | ICD-10-CM | POA: Diagnosis not present

## 2023-10-26 DIAGNOSIS — E440 Moderate protein-calorie malnutrition: Secondary | ICD-10-CM | POA: Insufficient documentation

## 2023-10-26 LAB — RENAL FUNCTION PANEL
Albumin: 2 g/dL — ABNORMAL LOW (ref 3.5–5.0)
Anion gap: 9 (ref 5–15)
BUN: 36 mg/dL — ABNORMAL HIGH (ref 8–23)
CO2: 26 mmol/L (ref 22–32)
Calcium: 8.2 mg/dL — ABNORMAL LOW (ref 8.9–10.3)
Chloride: 96 mmol/L — ABNORMAL LOW (ref 98–111)
Creatinine, Ser: 4.52 mg/dL — ABNORMAL HIGH (ref 0.44–1.00)
GFR, Estimated: 10 mL/min — ABNORMAL LOW (ref >60)
Glucose, Bld: 145 mg/dL — ABNORMAL HIGH (ref 70–99)
Phosphorus: 3.5 mg/dL (ref 2.5–4.6)
Potassium: 3.8 mmol/L (ref 3.5–5.1)
Sodium: 131 mmol/L — ABNORMAL LOW (ref 135–145)

## 2023-10-26 LAB — GLUCOSE, CAPILLARY
Glucose-Capillary: 127 mg/dL — ABNORMAL HIGH (ref 70–99)
Glucose-Capillary: 286 mg/dL — ABNORMAL HIGH (ref 70–99)
Glucose-Capillary: 132 mg/dL — ABNORMAL HIGH (ref 70–99)
Glucose-Capillary: 312 mg/dL — ABNORMAL HIGH (ref 70–99)

## 2023-10-26 LAB — CBC
HCT: 24.3 % — ABNORMAL LOW (ref 36.0–46.0)
Hemoglobin: 7.7 g/dL — ABNORMAL LOW (ref 12.0–15.0)
MCH: 26.2 pg (ref 26.0–34.0)
MCHC: 31.7 g/dL (ref 30.0–36.0)
MCV: 82.7 fL (ref 80.0–100.0)
Platelets: 195 K/uL (ref 150–400)
RBC: 2.94 MIL/uL — ABNORMAL LOW (ref 3.87–5.11)
RDW: 14.6 % (ref 11.5–15.5)
WBC: 8.7 K/uL (ref 4.0–10.5)
nRBC: 0 % (ref 0.0–0.2)

## 2023-10-26 LAB — PREPARE RBC (CROSSMATCH)

## 2023-10-26 LAB — PARATHYROID HORMONE, INTACT (NO CA): PTH: 398 pg/mL — ABNORMAL HIGH (ref 15–65)

## 2023-10-26 MED ORDER — SODIUM CHLORIDE 0.9% IV SOLUTION: Freq: Once | INTRAVENOUS | Status: AC

## 2023-10-26 MED ORDER — HEPARIN SODIUM (PORCINE) 5000 UNIT/ML IJ SOLN
5000.0000 [IU] | Freq: Three times a day (TID) | INTRAMUSCULAR | Status: DC
  Administered 2023-10-26 – 2023-10-28 (×6): 5000 [IU] via SUBCUTANEOUS
  Filled 2023-10-26 (×6): qty 1

## 2023-10-26 NOTE — Plan of Care (Signed)
  Problem: Education: Goal: Ability to describe self-care measures that may prevent or decrease complications (Diabetes Survival Skills Education) will improve Outcome: Progressing   Problem: Coping: Goal: Ability to adjust to condition or change in health will improve Outcome: Progressing   Problem: Skin Integrity: Goal: Risk for impaired skin integrity will decrease Outcome: Progressing   Problem: Pain Managment: Goal: General experience of comfort will improve and/or be controlled Outcome: Progressing   Problem: Safety: Goal: Ability to remain free from injury will improve Outcome: Progressing

## 2023-10-26 NOTE — Progress Notes (Signed)
 Patient ID: Jill Jefferson, female   DOB: 06/09/1960, 63 y.o.   MRN: [id number] S: Feels better. O:BP (!) 115/55 (BP Location: Right Arm)   Pulse 78   Temp 98.2 F (36.8 C) (Oral)   Resp 13   Ht 5' (1.524 m)   Wt 54 kg   SpO2 99%   BMI 23.25 kg/m   Intake/Output Summary (Last 24 hours) at 10/26/2023 1235 Last data filed at 10/25/2023 2000 Gross per 24 hour  Intake 100 ml  Output 0 ml  Net 100 ml   Intake/Output: I/O last 3 completed shifts: In: 100 [P.O.:100] Out: 0   Intake/Output this shift:  No intake/output data recorded. Weight change: 2.5 kg Gen: NAD CVS: RRR Resp:CTA Abd: +BS, soft, NT/ND Ext: no edema  Recent Labs  Lab 10/21/23 1652 10/22/23 0755 10/22/23 2327 10/24/23 0428 10/25/23 0414 10/26/23 0402  NA 125* 131* 127* 130* 130* 131*  K 3.3* 2.8* 3.5 3.7 3.8 3.8  CL 70* 76* 73* 86* 92* 96*  CO2 29 34* 31 30 24 26   GLUCOSE 282* 57* 229* 95 178* 145*  BUN 233* 230* 244* 122* 69* 36*  CREATININE 14.00* 15.10* 14.69* 9.46* 6.60* 4.52*  ALBUMIN 3.6  --  2.3*  --   --  2.0*  CALCIUM  7.7* 7.7* 7.3* 8.3* 8.2* 8.2*  PHOS  --   --   --   --   --  3.5  AST 23  --  18  --   --   --   ALT 29  --  21  --   --   --    Liver Function Tests: Recent Labs  Lab 10/21/23 1652 10/22/23 2327 10/26/23 0402  AST 23 18  --   ALT 29 21  --   ALKPHOS 118 69  --   BILITOT 0.4 1.0  --   PROT 7.3 6.4*  --   ALBUMIN 3.6 2.3* 2.0*   Recent Labs  Lab 10/21/23 1652 10/24/23 0428  LIPASE 463* 94*   No results for input(s): AMMONIA in the last 168 hours. CBC: Recent Labs  Lab 10/22/23 0755 10/22/23 2327 10/24/23 0428 10/25/23 0414 10/26/23 0402  WBC 7.6 8.7 9.4 8.4 8.7  NEUTROABS  --  7.0  --   --   --   HGB 6.6* 8.7* 9.0* 8.0* 7.7*  HCT 20.6* 25.5* 27.5* 24.5* 24.3*  MCV 78.6* 76.6* 79.5* 80.9 82.7  PLT 209 199 208 197 195   Cardiac Enzymes: No results for input(s): CKTOTAL, CKMB, CKMBINDEX, TROPONINI in the last 168 hours. CBG: Recent Labs   Lab 10/25/23 1145 10/25/23 1832 10/25/23 2145 10/26/23 0750 10/26/23 1229  GLUCAP 215* 118* 309* 127* 286*    Iron  Studies: No results for input(s): IRON , TIBC, TRANSFERRIN, FERRITIN in the last 72 hours. Studies/Results: No results found.  sodium chloride    Intravenous Once   carvedilol   6.25 mg Oral BID WC   Chlorhexidine  Gluconate Cloth  6 each Topical Q0600   darbepoetin (ARANESP ) injection - DIALYSIS  60 mcg Subcutaneous Q Fri-1800   feeding supplement  237 mL Oral TID BM   heparin  injection (subcutaneous)  5,000 Units Subcutaneous Q8H   insulin  aspart  0-5 Units Subcutaneous QHS   insulin  aspart  0-9 Units Subcutaneous TID WC   levETIRAcetam   500 mg Oral BID   lidocaine-EPINEPHrine  20 mL Infiltration Once   multivitamin  1 tablet Oral QHS    BMET    Component Value Date/Time   NA  131 (L) 10/26/2023 0402   NA 139 06/23/2019 1208   K 3.8 10/26/2023 0402   CL 96 (L) 10/26/2023 0402   CO2 26 10/26/2023 0402   GLUCOSE 145 (H) 10/26/2023 0402   BUN 36 (H) 10/26/2023 0402   BUN 27 (H) 06/23/2019 1208   CREATININE 4.52 (H) 10/26/2023 0402   CREATININE 3.48 (H) 06/08/2021 0000   CALCIUM  8.2 (L) 10/26/2023 0402   GFRNONAA 10 (L) 10/26/2023 0402   GFRAA 39 (L) 06/23/2019 1208   CBC    Component Value Date/Time   WBC 8.7 10/26/2023 0402   RBC 2.94 (L) 10/26/2023 0402   HGB 7.7 (L) 10/26/2023 0402   HGB 11.1 06/23/2019 1208   HCT 24.3 (L) 10/26/2023 0402   HCT 33.2 (L) 06/23/2019 1208   PLT 195 10/26/2023 0402   PLT 257 06/23/2019 1208   MCV 82.7 10/26/2023 0402   MCV 81 06/23/2019 1208   MCH 26.2 10/26/2023 0402   MCHC 31.7 10/26/2023 0402   RDW 14.6 10/26/2023 0402   RDW 12.4 06/23/2019 1208   LYMPHSABS 0.8 10/22/2023 2327   LYMPHSABS 1.8 06/23/2019 1208   MONOABS 0.7 10/22/2023 2327   EOSABS 0.1 10/22/2023 2327   EOSABS 0.1 06/23/2019 1208   BASOSABS 0.0 10/22/2023 2327   BASOSABS 0.0 06/23/2019 1208    Assessment 89F with historical CKD 5  now progressing to uremia, significant azotemia.   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  At length discussed this with patient and daughter.  She finally decided to proceed with dialysis and had RIJ TDC placed by RI followed by short HD sessionon 10/23/23.  Plan for ongoing serial HD which will be progressively longer and faster to prevent dialysis dysequilibrium syndrome.  She is high risk given her advanced uremia and significant azotemia.  Appreciate efforts of renal navigator to arrange for outpatient dialysis.  Will also need to consult [facility] for eventual access placement once more stable.  Had 3rd HD session yesterday.  Plan for next HD on Monday Will continue with ongoing education and discuss home modalities but daughter expresses some concerns about her suitability to do this.  This could all be sorted out and something like her transitional care unit if she does begin dialysis.  Extensive discussion with the patient regarding goals of care. Would not replace potassium Can give some gentle hydration in case she is hypovolemic with her poor oral intake and nausea/vomiting TSAT 13%, will start ESA and follow as well as IV venofer  200 mg RIJ TDC placed by IR on 10/23/23 Will check phosphorus and iPTH.  Fairy RONAL Sellar, MD [employer] [phone]

## 2023-10-26 NOTE — Progress Notes (Signed)
 [facility] PROGRESS NOTE    Progress Note  Jill Jefferson  FMW:[id number] DOB: 10/17/1960 DOA: 10/21/2023 PCP: Shelda Atlas, MD     Brief Narrative:   Jill Jefferson is an 63 y.o. female past medical history significant for chronic kidney disease stage V, seizure disorder, HFrEF with a last EF of 40%, chronic kidney disease stage V, insulin -dependent diabetes mellitus type 2 recently discharged from the hospital on October 2025 for heart failure exacerbation and acute kidney injury, during that time she was hesitant to start hemodialysis, she was discharged on high-dose diuretic therapy comes in for dizziness generalized progressive weakness nausea vomiting and intermittent chest tightness, her workup was significant for progressive renal failure, started on hemodialysis.   Assessment/Plan:   CKD stage V, new ESRD  Uremia  Hypokalemia  - Renal input greatly appreciated, palliative care input greatly appreciated, patient decided HD trial . - HD access by IR 10/15, started HD on 10/15, so far she received 3 dialysis back-to-back - Further management per renal - Remains symptoms much improved, she is more awake and energetic today, no further nausea, but appetite remains poor but improving.     Anemia of chronic renal disease/microcytic anemia: - no signs of overt bleeding. - Improved after receiving 1 unit PRBC .  Globin 7.7 this morning will give another unit PRBC - IV iron  per renal   Hyponatremia - Improving with dialysis  Elevated lipase: Denies any abdominal pain CT scan showed no acute findings. Continue antiemetics. - Trending down  Elevated troponins: In the setting of chronic renal disease Twelve-lead EKG showed no evidence of ischemia.  Incidental adrenal nodule: CT scan of the abdomen pelvis showed 16mm right adrenal nodule. Will need further imaging for characterization.  Essential hypertension: Continue Coreg  holding diuretics. Use hydralazine  as  needed.  Seizure disorder: Continue Keppra .  Chronic HFrEF: Last EF showed a 2D echo 40%. Chest x-ray showed no pulmonary edema. - Volume management with HD  Insulin -dependent diabetes mellitus type 2: Controlled with last A1c 9.1 But she remains with poor appetite, so holding long-acting insulin , continue with sensitive insulin  sliding scale.  Patient requesting some pain medicine for chronic bilateral hip pain, CT abdomen pelvis on admission with no acute findings, she was on tramadol, will change to Vicodin now she started HD, PT/OT consulted    DVT prophylaxis:  subcu heparin   Family Communication: None at bedside Status is: Inpatient Remains inpatient appropriate because: CKD V    Code Status:     Code Status Orders  (From admission, onward)           Start     Ordered   10/21/23 2254  Full code  Continuous       Question:  By:  Answer:  Consent: discussion documented in EHR   10/21/23 2255           Code Status History     Date Active Date Inactive Code Status Order ID Comments User Context   07/12/2023 1559 07/18/2023 1649 Full Code [id number]  Seena Marsa [name], MD Inpatient   05/10/2021 2124 05/14/2021 0106 Full Code [id number]  Marcene Eva [name], DO ED   09/01/2020 2043 09/08/2020 0017 Full Code [id number]  Seena Marsa [name], MD ED         IV Access:   Peripheral IV   Procedures and diagnostic studies:   No results found.    Medical Consultants:   Nephrology Interventional radiology   Subjective:    Grant Luis ports her pain  is controlled, no further nausea, reports she is feeling better, but appetite still poor.  Objective:    Vitals:   10/25/23 2100 10/26/23 0111 10/26/23 0400 10/26/23 0730  BP:  (!) 136/57 (!) 160/54 116/64  Pulse: 74 74 75 71  Resp:  11 16 10   Temp:   98.7 F (37.1 C) 98.2 F (36.8 C)  TempSrc:   Oral Oral  SpO2: 97% 96% 99% 98%  Weight:      Height:       SpO2: 98 % O2 Flow Rate (L/min): 2  L/min   Intake/Output Summary (Last 24 hours) at 10/26/2023 1125 Last data filed at 10/25/2023 2000 Gross per 24 hour  Intake 100 ml  Output 0 ml  Net 100 ml   Filed Weights   10/23/23 1657 10/24/23 1350 10/25/23 1455  Weight: 52.6 kg 51.5 kg 54 kg    Exam:   Awake Alert, Oriented X 3, more interactive today Symmetrical Chest wall movement, Good air movement bilaterally, CTAB RRR,No Gallops,Rubs or new Murmurs, No Parasternal Heave +ve B.Sounds, Abd Soft, No tenderness, No rebound - guarding or rigidity. No Cyanosis, Clubbing or edema, No new Rash or bruise       Data Reviewed:    Labs: Basic Metabolic Panel: Recent Labs  Lab 10/22/23 0755 10/22/23 2327 10/24/23 0428 10/25/23 0414 10/26/23 0402  NA 131* 127* 130* 130* 131*  K 2.8* 3.5 3.7 3.8 3.8  CL 76* 73* 86* 92* 96*  CO2 34* 31 30 24 26   GLUCOSE 57* 229* 95 178* 145*  BUN 230* 244* 122* 69* 36*  CREATININE 15.10* 14.69* 9.46* 6.60* 4.52*  CALCIUM  7.7* 7.3* 8.3* 8.2* 8.2*  MG  --  2.2  --   --   --   PHOS  --   --   --   --  3.5   GFR Estimated Creatinine Clearance: 9.4 mL/min (A) (by C-G formula based on SCr of 4.52 mg/dL (H)). Liver Function Tests: Recent Labs  Lab 10/21/23 1652 10/22/23 2327 10/26/23 0402  AST 23 18  --   ALT 29 21  --   ALKPHOS 118 69  --   BILITOT 0.4 1.0  --   PROT 7.3 6.4*  --   ALBUMIN 3.6 2.3* 2.0*   Recent Labs  Lab 10/21/23 1652 10/24/23 0428  LIPASE 463* 94*   No results for input(s): AMMONIA in the last 168 hours. Coagulation profile No results for input(s): INR, PROTIME in the last 168 hours. COVID-19 Labs  No results for input(s): DDIMER, FERRITIN, LDH, CRP in the last 72 hours.   Lab Results  Component Value Date   SARSCOV2NAA NEGATIVE 07/11/2023   SARSCOV2NAA NEGATIVE 09/01/2020    CBC: Recent Labs  Lab 10/22/23 0755 10/22/23 2327 10/24/23 0428 10/25/23 0414 10/26/23 0402  WBC 7.6 8.7 9.4 8.4 8.7  NEUTROABS  --  7.0  --    --   --   HGB 6.6* 8.7* 9.0* 8.0* 7.7*  HCT 20.6* 25.5* 27.5* 24.5* 24.3*  MCV 78.6* 76.6* 79.5* 80.9 82.7  PLT 209 199 208 197 195   Cardiac Enzymes: No results for input(s): CKTOTAL, CKMB, CKMBINDEX, TROPONINI in the last 168 hours. BNP (last 3 results) Recent Labs    07/11/23 1625  PROBNP 33,608.0*   CBG: Recent Labs  Lab 10/25/23 0915 10/25/23 1145 10/25/23 1832 10/25/23 2145 10/26/23 0750  GLUCAP 186* 215* 118* 309* 127*   D-Dimer: No results for input(s): DDIMER in the last 72 hours.  Hgb A1c: No results for input(s): HGBA1C in the last 72 hours.  Lipid Profile: No results for input(s): CHOL, HDL, LDLCALC, TRIG, CHOLHDL, LDLDIRECT in the last 72 hours. Thyroid  function studies: No results for input(s): TSH, T4TOTAL, T3FREE, THYROIDAB in the last 72 hours.  Invalid input(s): FREET3 Anemia work up: No results for input(s): VITAMINB12, FOLATE, FERRITIN, TIBC, IRON , RETICCTPCT in the last 72 hours.  Sepsis Labs: Recent Labs  Lab 10/22/23 2327 10/24/23 0428 10/25/23 0414 10/26/23 0402  WBC 8.7 9.4 8.4 8.7   Microbiology No results found for this or any previous visit (from the past 240 hours).   Medications:    sodium chloride    Intravenous Once   carvedilol   6.25 mg Oral BID WC   Chlorhexidine  Gluconate Cloth  6 each Topical Q0600   darbepoetin (ARANESP ) injection - DIALYSIS  60 mcg Subcutaneous Q Fri-1800   feeding supplement  237 mL Oral TID BM   insulin  aspart  0-5 Units Subcutaneous QHS   insulin  aspart  0-9 Units Subcutaneous TID WC   levETIRAcetam   500 mg Oral BID   lidocaine-EPINEPHrine  20 mL Infiltration Once   multivitamin  1 tablet Oral QHS   Continuous Infusions:      LOS: 5 days   Romani Wilbon  [facility]  10/26/2023, 11:25 AM

## 2023-10-27 ENCOUNTER — Inpatient Hospital Stay (HOSPITAL_COMMUNITY)

## 2023-10-27 DIAGNOSIS — N186 End stage renal disease: Secondary | ICD-10-CM

## 2023-10-27 DIAGNOSIS — N185 Chronic kidney disease, stage 5: Secondary | ICD-10-CM | POA: Diagnosis not present

## 2023-10-27 DIAGNOSIS — E1122 Type 2 diabetes mellitus with diabetic chronic kidney disease: Secondary | ICD-10-CM

## 2023-10-27 DIAGNOSIS — I10 Essential (primary) hypertension: Secondary | ICD-10-CM

## 2023-10-27 DIAGNOSIS — Z992 Dependence on renal dialysis: Secondary | ICD-10-CM | POA: Diagnosis not present

## 2023-10-27 LAB — BPAM RBC
Blood Product Expiration Date: 202511202359
ISSUE DATE / TIME: 202510181405
Unit Type and Rh: 7300

## 2023-10-27 LAB — CBC
HCT: 28.6 % — ABNORMAL LOW (ref 36.0–46.0)
Hemoglobin: 9.2 g/dL — ABNORMAL LOW (ref 12.0–15.0)
MCH: 26.6 pg (ref 26.0–34.0)
MCHC: 32.2 g/dL (ref 30.0–36.0)
MCV: 82.7 fL (ref 80.0–100.0)
Platelets: 196 K/uL (ref 150–400)
RBC: 3.46 MIL/uL — ABNORMAL LOW (ref 3.87–5.11)
RDW: 15 % (ref 11.5–15.5)
WBC: 9.1 K/uL (ref 4.0–10.5)
nRBC: 0 % (ref 0.0–0.2)

## 2023-10-27 LAB — RENAL FUNCTION PANEL
Albumin: 2.1 g/dL — ABNORMAL LOW (ref 3.5–5.0)
Anion gap: 13 (ref 5–15)
BUN: 51 mg/dL — ABNORMAL HIGH (ref 8–23)
CO2: 25 mmol/L (ref 22–32)
Calcium: 8.3 mg/dL — ABNORMAL LOW (ref 8.9–10.3)
Chloride: 92 mmol/L — ABNORMAL LOW (ref 98–111)
Creatinine, Ser: 6.02 mg/dL — ABNORMAL HIGH (ref 0.44–1.00)
GFR, Estimated: 7 mL/min — ABNORMAL LOW (ref >60)
Glucose, Bld: 85 mg/dL (ref 70–99)
Phosphorus: 4.1 mg/dL (ref 2.5–4.6)
Potassium: 3.8 mmol/L (ref 3.5–5.1)
Sodium: 130 mmol/L — ABNORMAL LOW (ref 135–145)

## 2023-10-27 LAB — GLUCOSE, CAPILLARY
Glucose-Capillary: 113 mg/dL — ABNORMAL HIGH (ref 70–99)
Glucose-Capillary: 274 mg/dL — ABNORMAL HIGH (ref 70–99)
Glucose-Capillary: 225 mg/dL — ABNORMAL HIGH (ref 70–99)
Glucose-Capillary: 137 mg/dL — ABNORMAL HIGH (ref 70–99)

## 2023-10-27 LAB — TYPE AND SCREEN
ABO/RH(D): B POS
Antibody Screen: NEGATIVE
Unit division: 0

## 2023-10-27 MED ORDER — INSULIN GLARGINE-YFGN 100 UNIT/ML ~~LOC~~ SOLN
4.0000 [IU] | Freq: Every day | SUBCUTANEOUS | Status: DC
  Administered 2023-10-27 – 2023-10-30 (×3): 4 [IU] via SUBCUTANEOUS
  Filled 2023-10-27 (×4): qty 0.04

## 2023-10-27 NOTE — Plan of Care (Signed)
  Problem: Education: Goal: Ability to describe self-care measures that may prevent or decrease complications (Diabetes Survival Skills Education) will improve Outcome: Progressing Goal: Individualized Educational Video(s) Outcome: Progressing   Problem: Coping: Goal: Ability to adjust to condition or change in health will improve Outcome: Progressing   Problem: Fluid Volume: Goal: Ability to maintain a balanced intake and output will improve Outcome: Progressing   Problem: Health Behavior/Discharge Planning: Goal: Ability to identify and utilize available resources and services will improve Outcome: Progressing Goal: Ability to manage health-related needs will improve Outcome: Progressing   Problem: Metabolic: Goal: Ability to maintain appropriate glucose levels will improve Outcome: Progressing   Problem: Nutritional: Goal: Maintenance of adequate nutrition will improve Outcome: Progressing Goal: Progress toward achieving an optimal weight will improve Outcome: Progressing   Problem: Skin Integrity: Goal: Risk for impaired skin integrity will decrease Outcome: Progressing   Problem: Tissue Perfusion: Goal: Adequacy of tissue perfusion will improve Outcome: Progressing   Problem: Education: Goal: Knowledge of General Education information will improve Description: Including pain rating scale, medication(s)/side effects and non-pharmacologic comfort measures Outcome: Progressing   Problem: Health Behavior/Discharge Planning: Goal: Ability to manage health-related needs will improve Outcome: Progressing   Problem: Clinical Measurements: Goal: Ability to maintain clinical measurements within normal limits will improve Outcome: Progressing Goal: Will remain free from infection Outcome: Progressing Goal: Diagnostic test results will improve Outcome: Progressing Goal: Respiratory complications will improve Outcome: Progressing Goal: Cardiovascular complication will  be avoided Outcome: Progressing   Problem: Activity: Goal: Risk for activity intolerance will decrease Outcome: Progressing   Problem: Nutrition: Goal: Adequate nutrition will be maintained Outcome: Progressing   Problem: Coping: Goal: Level of anxiety will decrease Outcome: Progressing   Problem: Elimination: Goal: Will not experience complications related to bowel motility Outcome: Progressing Goal: Will not experience complications related to urinary retention Outcome: Progressing   Problem: Pain Managment: Goal: General experience of comfort will improve and/or be controlled Outcome: Progressing   Problem: Safety: Goal: Ability to remain free from injury will improve Outcome: Progressing   Problem: Skin Integrity: Goal: Risk for impaired skin integrity will decrease Outcome: Progressing   Problem: Education: Goal: Knowledge of disease and its progression will improve Outcome: Progressing Goal: Individualized Educational Video(s) Outcome: Progressing   Problem: Fluid Volume: Goal: Compliance with measures to maintain balanced fluid volume will improve Outcome: Progressing   Problem: Health Behavior/Discharge Planning: Goal: Ability to manage health-related needs will improve Outcome: Progressing   Problem: Nutritional: Goal: Ability to make healthy dietary choices will improve Outcome: Progressing   Problem: Clinical Measurements: Goal: Complications related to the disease process, condition or treatment will be avoided or minimized Outcome: Progressing

## 2023-10-27 NOTE — Progress Notes (Addendum)
 [facility] PROGRESS NOTE    Progress Note  Jill Jefferson  FMW:[id number] DOB: [birth year] DOA: 10/21/2023 PCP: Shelda Atlas, MD     Brief Narrative:   Jill Jefferson is an 63 y.o. female past medical history significant for chronic kidney disease stage V, seizure disorder, HFrEF with a last EF of 40%, chronic kidney disease stage V, insulin -dependent diabetes mellitus type 2 recently discharged from the hospital on October 2025 for heart failure exacerbation and acute kidney injury, during that time she was hesitant to start hemodialysis, she was discharged on high-dose diuretic therapy comes in for dizziness generalized progressive weakness nausea vomiting and intermittent chest tightness, her workup was significant for progressive renal failure, started on hemodialysis.   Assessment/Plan:   CKD stage V, new ESRD  Uremia  Hypokalemia  - Renal input greatly appreciated, palliative care input greatly appreciated, patient decided HD trial . - HD access by IR 10/15, started HD on 10/15, so far she received 3 dialysis back-to-back, plan for next HD tomorrow - Further management per renal - Uremia symptoms much improved, her mentation much cleared, no further nausea, and her appetite much improved . - Plan for left arm AV fistula versus graft tomorrow by vascular surgery .  Anemia of chronic renal disease/microcytic anemia: - no signs of overt bleeding. - Quires units PRBC transfusion so far, hemoglobin stable at 9.2.   - IV iron  per renal   Hyponatremia - Improving with dialysis  Elevated lipase: Denies any abdominal pain CT scan showed no acute findings. Continue antiemetics. - Trending down  Elevated troponins: In the setting of chronic renal disease Twelve-lead EKG showed no evidence of ischemia.  Incidental adrenal nodule: CT scan of the abdomen pelvis showed 16mm right adrenal nodule. Will need further imaging for characterization.  Essential  hypertension: Continue Coreg  holding diuretics. Use hydralazine  as needed.  Seizure disorder: Continue Keppra .  Chronic HFrEF: Last EF showed a 2D echo 40%. Chest x-ray showed no pulmonary edema. - Volume management with HD  Insulin -dependent diabetes mellitus type 2: Controlled with last A1c 9.1 With poor appetite initially, so kept on insulin  sliding scale, but now that has improved, CBG started to increase, so we will start on long-acting insulin , but will keep dose on the lower side as n.p.o. for HD access tomorrow.       DVT prophylaxis:  subcu heparin   Family Communication: None at bedside, spoke with daughter Jill Jefferson by phone Status is: Inpatient Remains inpatient appropriate because: CKD V    Code Status:     Code Status Orders  (From admission, onward)           Start     Ordered   10/21/23 2254  Full code  Continuous       Question:  By:  Answer:  Consent: discussion documented in EHR   10/21/23 2255           Code Status History     Date Active Date Inactive Code Status Order ID Comments User Context   07/12/2023 1559 07/18/2023 1649 Full Code [id number]  Seena Marsa [name], MD Inpatient   05/10/2021 2124 05/14/2021 0106 Full Code [id number]  Marcene Eva [name], DO ED   09/01/2020 2043 09/08/2020 0017 Full Code [id number]  Seena Marsa [name], MD ED         IV Access:   Peripheral IV   Procedures and diagnostic studies:   No results found.    Medical Consultants:   Nephrology Interventional radiology Vascular surgery  Subjective:    Katrinna Krass no further nausea, reports appetite has improved,  Objective:    Vitals:   10/27/23 0400 10/27/23 0432 10/27/23 0800 10/27/23 0837  BP: (!) 147/55  137/64   Pulse: 78 78 77 81  Resp: 11 13 13 10   Temp: 98 F (36.7 C)  98.2 F (36.8 C)   TempSrc: Oral  Oral   SpO2: 99% 100% 98% 98%  Weight:      Height:       SpO2: 98 % O2 Flow Rate (L/min): 2 L/min   Intake/Output  Summary (Last 24 hours) at 10/27/2023 1107 Last data filed at 10/26/2023 1752 Gross per 24 hour  Intake 685 ml  Output 300 ml  Net 385 ml   Filed Weights   10/23/23 1657 10/24/23 1350 10/25/23 1455  Weight: 52.6 kg 51.5 kg 54 kg    Exam:   Awake Alert, Oriented X 3, No new F.N deficits, Normal affect Symmetrical Chest wall movement, Good air movement bilaterally, CTAB RRR,No Gallops,Rubs or new Murmurs, No Parasternal Heave +ve B.Sounds, Abd Soft, No tenderness, No rebound - guarding or rigidity. No Cyanosis, Clubbing or edema, No new Rash or bruise       Data Reviewed:    Labs: Basic Metabolic Panel: Recent Labs  Lab 10/22/23 2327 10/24/23 0428 10/25/23 0414 10/26/23 0402 10/27/23 0433  NA 127* 130* 130* 131* 130*  K 3.5 3.7 3.8 3.8 3.8  CL 73* 86* 92* 96* 92*  CO2 31 30 24 26 25   GLUCOSE 229* 95 178* 145* 85  BUN 244* 122* 69* 36* 51*  CREATININE 14.69* 9.46* 6.60* 4.52* 6.02*  CALCIUM  7.3* 8.3* 8.2* 8.2* 8.3*  MG 2.2  --   --   --   --   PHOS  --   --   --  3.5 4.1   GFR Estimated Creatinine Clearance: 7 mL/min (A) (by C-G formula based on SCr of 6.02 mg/dL (H)). Liver Function Tests: Recent Labs  Lab 10/21/23 1652 10/22/23 2327 10/26/23 0402 10/27/23 0433  AST 23 18  --   --   ALT 29 21  --   --   ALKPHOS 118 69  --   --   BILITOT 0.4 1.0  --   --   PROT 7.3 6.4*  --   --   ALBUMIN 3.6 2.3* 2.0* 2.1*   Recent Labs  Lab 10/21/23 1652 10/24/23 0428  LIPASE 463* 94*   No results for input(s): AMMONIA in the last 168 hours. Coagulation profile No results for input(s): INR, PROTIME in the last 168 hours. COVID-19 Labs  No results for input(s): DDIMER, FERRITIN, LDH, CRP in the last 72 hours.   Lab Results  Component Value Date   SARSCOV2NAA NEGATIVE 07/11/2023   SARSCOV2NAA NEGATIVE 09/01/2020    CBC: Recent Labs  Lab 10/22/23 2327 10/24/23 0428 10/25/23 0414 10/26/23 0402 10/27/23 0433  WBC 8.7 9.4 8.4 8.7 9.1   NEUTROABS 7.0  --   --   --   --   HGB 8.7* 9.0* 8.0* 7.7* 9.2*  HCT 25.5* 27.5* 24.5* 24.3* 28.6*  MCV 76.6* 79.5* 80.9 82.7 82.7  PLT 199 208 197 195 196   Cardiac Enzymes: No results for input(s): CKTOTAL, CKMB, CKMBINDEX, TROPONINI in the last 168 hours. BNP (last 3 results) Recent Labs    07/11/23 1625  PROBNP 33,608.0*   CBG: Recent Labs  Lab 10/26/23 0750 10/26/23 1229 10/26/23 1607 10/26/23 2150 10/27/23 0743  GLUCAP 127*  286* 132* 312* 113*   D-Dimer: No results for input(s): DDIMER in the last 72 hours. Hgb A1c: No results for input(s): HGBA1C in the last 72 hours.  Lipid Profile: No results for input(s): CHOL, HDL, LDLCALC, TRIG, CHOLHDL, LDLDIRECT in the last 72 hours. Thyroid  function studies: No results for input(s): TSH, T4TOTAL, T3FREE, THYROIDAB in the last 72 hours.  Invalid input(s): FREET3 Anemia work up: No results for input(s): VITAMINB12, FOLATE, FERRITIN, TIBC, IRON , RETICCTPCT in the last 72 hours.  Sepsis Labs: Recent Labs  Lab 10/24/23 0428 10/25/23 0414 10/26/23 0402 10/27/23 0433  WBC 9.4 8.4 8.7 9.1   Microbiology No results found for this or any previous visit (from the past 240 hours).   Medications:    carvedilol   6.25 mg Oral BID WC   Chlorhexidine  Gluconate Cloth  6 each Topical Q0600   darbepoetin (ARANESP ) injection - DIALYSIS  60 mcg Subcutaneous Q Fri-1800   feeding supplement  237 mL Oral TID BM   heparin  injection (subcutaneous)  5,000 Units Subcutaneous Q8H   insulin  aspart  0-5 Units Subcutaneous QHS   insulin  aspart  0-9 Units Subcutaneous TID WC   levETIRAcetam   500 mg Oral BID   lidocaine-EPINEPHrine  20 mL Infiltration Once   multivitamin  1 tablet Oral QHS   Continuous Infusions:      LOS: 6 days   Sheily Lineman  [facility]  10/27/2023, 11:07 AM

## 2023-10-27 NOTE — Consult Note (Addendum)
 Hospital Consult    Reason for Consult: Permanent dialysis access Requesting Physician: [facility] MD MRN #:  [id number]  History of Present Illness: Jill Jefferson is a 63 y.o. female with a PMH of DMII, seizures, HTN, and CKD 5 who was admitted to [facility] on 10/21/2023 with progression to ESRD needing dialysis.  She had a right IJ TDC placed by IR on 10/15.  We were consulted for permanent dialysis access placement.  The patient is resting comfortably today without any complaints.  She says that she is feeling much better after starting dialysis.  She denies any nausea.  She says that she is ambidextrous but would prefer access in her left arm.  Past Medical History:  Diagnosis Date   Diabetes (HCC)    Edema    Focal neurological deficit 09/01/2020   H. pylori infection 06/2019   H. pylori infection 06/2019   Hyperosmolar hyperglycemic state (HHS) (HCC) 09/01/2020   Hypertension    Proteinuria 11/2018   Seizures (HCC)    Status epilepticus (HCC) 05/10/2021    Past Surgical History:  Procedure Laterality Date   BRAIN SURGERY Left    1990's   CESAREAN SECTION     history of 4 c-sections   IR TUNNELED CENTRAL VENOUS CATH PLC W IMG  10/23/2023    No Known Allergies  Prior to Admission medications   Medication Sig Start Date End Date Taking? Authorizing Provider  Acetaminophen  Extra Strength 500 MG TABS Take 1-2 tablets by mouth every 6 (six) hours as needed. 10/08/23  Yes [provider]  carvedilol  (COREG ) 6.25 MG tablet Take 1 tablet (6.25 mg total) by mouth 2 (two) times daily with a meal. 07/18/23  Yes Rojelio Nest, DO  insulin  degludec (TRESIBA  FLEXTOUCH) 100 UNIT/ML FlexTouch Pen inject 10 units every evening Patient taking differently: Inject 10-14 Units into the skin every evening. Inject 10-14 units with evening meal depending on blood sugar. 12/11/21  Yes   levETIRAcetam  (KEPPRA ) 500 MG tablet Take 1 tablet (500 mg total) by mouth 2 (two) times daily.  04/02/23  Yes Almarie Waddell [name], NP  metolazone  (ZAROXOLYN ) 5 MG tablet Take 1 tablet (5 mg total) by mouth daily. 07/18/23  Yes Rojelio Nest, DO  ondansetron (ZOFRAN-ODT) 4 MG disintegrating tablet Take 4 mg by mouth every 8 (eight) hours as needed. 10/08/23  Yes [provider]  torsemide  (DEMADEX ) 100 MG tablet Take 100 mg by mouth 2 (two) times daily.   Yes [provider]  traMADol (ULTRAM) 50 MG tablet Take 50 mg by mouth 2 (two) times daily as needed. 10/08/23  Yes [provider]  Insulin  Pen Needle (BD PEN NEEDLE NANO U/F) 32G X 4 MM MISC for insulin  injections qdDx: E11.65 on insulin  06/08/21     torsemide  (DEMADEX ) 100 MG tablet Take 1 tablet (100 mg total) by mouth 2 (two) times daily. 07/18/23 10/16/23  Rojelio Nest, DO    Social History   Socioeconomic History   Marital status: Single    Spouse name: Not on file   Number of children: Not on file   Years of education: Not on file   Highest education level: Not on file  Occupational History   Not on file  Tobacco Use   Smoking status: Never   Smokeless tobacco: Never  Vaping Use   Vaping status: Never Used  Substance and Sexual Activity   Alcohol use: Never   Drug use: Never   Sexual activity: Not Currently  Other Topics Concern  Not on file  Social History Narrative   Not on file   Social Drivers of Health   Financial Resource Strain: Not on file  Food Insecurity: No Food Insecurity (10/22/2023)   Hunger Vital Sign    Worried About Running Out of Food in the Last Year: Never true    Ran Out of Food in the Last Year: Never true  Transportation Needs: Patient Declined (10/22/2023)   PRAPARE - Administrator, Civil Service (Medical): Patient declined    Lack of Transportation (Non-Medical): Patient declined  Physical Activity: Not on file  Stress: Not on file  Social Connections: Not on file  Intimate Partner Violence: Patient Declined (10/22/2023)   Humiliation, Afraid, Rape,  and Kick questionnaire    Fear of Current or Ex-Partner: Patient declined    Emotionally Abused: Patient declined    Physically Abused: Patient declined    Sexually Abused: Patient declined    Family History  Problem Relation Age of Onset   Diabetes Mother    Hypertension Mother     ROS: Otherwise negative unless mentioned in HPI  Physical Examination  Vitals:   10/27/23 0800 10/27/23 0837  BP: 137/64   Pulse: 77 81  Resp: 13 10  Temp: 98.2 F (36.8 C)   SpO2: 98% 98%   Body mass index is 23.25 kg/m.  General:  WDWN in NAD.  Right IJ TDC in place Gait: Not observed HENT: WNL, normocephalic Pulmonary: normal non-labored breathing Cardiac: Regular Abdomen: soft, NT/ND, no masses Skin: without rashes Vascular Exam/Pulses: 2+ radial and brachial pulses bilaterally Extremities: without ischemic changes, without Gangrene , without cellulitis; without open wounds;  Musculoskeletal: no muscle wasting or atrophy  Neurologic: A&O X 3;  No focal weakness or paresthesias are detected; speech is fluent/normal Psychiatric:  The pt has Normal affect. Lymph:  Unremarkable  CBC    Component Value Date/Time   WBC 9.1 10/27/2023 0433   RBC 3.46 (L) 10/27/2023 0433   HGB 9.2 (L) 10/27/2023 0433   HGB 11.1 06/23/2019 1208   HCT 28.6 (L) 10/27/2023 0433   HCT 33.2 (L) 06/23/2019 1208   PLT 196 10/27/2023 0433   PLT 257 06/23/2019 1208   MCV 82.7 10/27/2023 0433   MCV 81 06/23/2019 1208   MCH 26.6 10/27/2023 0433   MCHC 32.2 10/27/2023 0433   RDW 15.0 10/27/2023 0433   RDW 12.4 06/23/2019 1208   LYMPHSABS 0.8 10/22/2023 2327   LYMPHSABS 1.8 06/23/2019 1208   MONOABS 0.7 10/22/2023 2327   EOSABS 0.1 10/22/2023 2327   EOSABS 0.1 06/23/2019 1208   BASOSABS 0.0 10/22/2023 2327   BASOSABS 0.0 06/23/2019 1208    BMET    Component Value Date/Time   NA 130 (L) 10/27/2023 0433   NA 139 06/23/2019 1208   K 3.8 10/27/2023 0433   CL 92 (L) 10/27/2023 0433   CO2 25 10/27/2023  0433   GLUCOSE 85 10/27/2023 0433   BUN 51 (H) 10/27/2023 0433   BUN 27 (H) 06/23/2019 1208   CREATININE 6.02 (H) 10/27/2023 0433   CREATININE 3.48 (H) 06/08/2021 0000   CALCIUM  8.3 (L) 10/27/2023 0433   GFRNONAA 7 (L) 10/27/2023 0433   GFRAA 39 (L) 06/23/2019 1208    COAGS: Lab Results  Component Value Date   INR 0.9 05/10/2021     Non-Invasive Vascular Imaging:   Vein mapping ordered   ASSESSMENT/PLAN: This is a 63 y.o. female who has been admitted for CKD with progression to ESRD   -  The patient was admitted to [facility] 6 days ago with progression of CKD 5 to ESRD.  She had a right IJ TDC placed by IR on 10/15 and had her first session of dialysis later that day.  So far she has had 3 dialysis sessions and her medical state has improved -She has no prior permanent dialysis access.  She states that she is ambidextrous.  She mostly writes with her right hand, so we will pursue access in her left upper extremity -On exam she is resting comfortably without any distress.  She has palpable radial and brachial pulses bilaterally.  - I have explained to the patient that we can perform surgery for permanent dialysis access while she is inpatient.  This would include creation of a left upper arm fistula versus graft.  She is aware that she may require graft placement if she does not have any sizable veins in the left arm.  I have explained the risk versus benefits of the procedure.  I have answered all her questions. - We will obtain vein mapping.  She is agreeable to surgery.  We will plan for left upper arm arteriovenous fistula versus graft creation tomorrow in the OR with Dr. Gretta.  Will make n.p.o. at midnight.  Will place consent orders   Ahmed Holster PA-C [facility] [phone]   I have seen and evaluated the patient. I agree with the PA note as documented above.  63 year old female with stage V CKD now ESRD the vascular surgery has been consulted for  permanent access placement.  She has a TDC placed by IR that is working.  She is right-handed.  Discussed plan for left arm AV fistula versus graft tomorrow in the OR with me.  N.p.o. at midnight.  Consent order placed and vein mapping ordered as well.  Lonni DOROTHA Gretta, MD [facility] [place] Office: [phone]

## 2023-10-27 NOTE — Progress Notes (Signed)
 Patient ID: Jill Jefferson, female   DOB: 04-07-1960, 63 y.o.   MRN: [id number] S: Feels much better. O:BP 137/64 (BP Location: Right Arm)   Pulse 81   Temp 98.2 F (36.8 C) (Oral)   Resp 10   Ht 5' (1.524 m)   Wt 54 kg   SpO2 98%   BMI 23.25 kg/m   Intake/Output Summary (Last 24 hours) at 10/27/2023 1218 Last data filed at 10/26/2023 1752 Gross per 24 hour  Intake 685 ml  Output 300 ml  Net 385 ml   Intake/Output: I/O last 3 completed shifts: In: 935 [P.O.:620; Blood:315] Out: 300 [Urine:300]  Intake/Output this shift:  No intake/output data recorded. Weight change:  Gen: NAD CVS: RRR Resp:CTA Abd: +BS, soft, NT/ND Ext: no edema  Recent Labs  Lab 10/21/23 1652 10/22/23 0755 10/22/23 2327 10/24/23 0428 10/25/23 0414 10/26/23 0402 10/27/23 0433  NA 125* 131* 127* 130* 130* 131* 130*  K 3.3* 2.8* 3.5 3.7 3.8 3.8 3.8  CL 70* 76* 73* 86* 92* 96* 92*  CO2 29 34* 31 30 24 26 25   GLUCOSE 282* 57* 229* 95 178* 145* 85  BUN 233* 230* 244* 122* 69* 36* 51*  CREATININE 14.00* 15.10* 14.69* 9.46* 6.60* 4.52* 6.02*  ALBUMIN 3.6  --  2.3*  --   --  2.0* 2.1*  CALCIUM  7.7* 7.7* 7.3* 8.3* 8.2* 8.2* 8.3*  PHOS  --   --   --   --   --  3.5 4.1  AST 23  --  18  --   --   --   --   ALT 29  --  21  --   --   --   --    Liver Function Tests: Recent Labs  Lab 10/21/23 1652 10/22/23 2327 10/26/23 0402 10/27/23 0433  AST 23 18  --   --   ALT 29 21  --   --   ALKPHOS 118 69  --   --   BILITOT 0.4 1.0  --   --   PROT 7.3 6.4*  --   --   ALBUMIN 3.6 2.3* 2.0* 2.1*   Recent Labs  Lab 10/21/23 1652 10/24/23 0428  LIPASE 463* 94*   No results for input(s): AMMONIA in the last 168 hours. CBC: Recent Labs  Lab 10/22/23 2327 10/24/23 0428 10/25/23 0414 10/26/23 0402 10/27/23 0433  WBC 8.7 9.4 8.4 8.7 9.1  NEUTROABS 7.0  --   --   --   --   HGB 8.7* 9.0* 8.0* 7.7* 9.2*  HCT 25.5* 27.5* 24.5* 24.3* 28.6*  MCV 76.6* 79.5* 80.9 82.7 82.7  PLT 199 208 197 195 196    Cardiac Enzymes: No results for input(s): CKTOTAL, CKMB, CKMBINDEX, TROPONINI in the last 168 hours. CBG: Recent Labs  Lab 10/26/23 1229 10/26/23 1607 10/26/23 2150 10/27/23 0743 10/27/23 1216  GLUCAP 286* 132* 312* 113* 274*    Iron  Studies: No results for input(s): IRON , TIBC, TRANSFERRIN, FERRITIN in the last 72 hours. Studies/Results: No results found.  carvedilol   6.25 mg Oral BID WC   Chlorhexidine  Gluconate Cloth  6 each Topical Q0600   darbepoetin (ARANESP ) injection - DIALYSIS  60 mcg Subcutaneous Q Fri-1800   feeding supplement  237 mL Oral TID BM   heparin  injection (subcutaneous)  5,000 Units Subcutaneous Q8H   insulin  aspart  0-5 Units Subcutaneous QHS   insulin  aspart  0-9 Units Subcutaneous TID WC   insulin  glargine-yfgn  4 Units Subcutaneous Daily  levETIRAcetam   500 mg Oral BID   lidocaine-EPINEPHrine  20 mL Infiltration Once   multivitamin  1 tablet Oral QHS    BMET    Component Value Date/Time   NA 130 (L) 10/27/2023 0433   NA 139 06/23/2019 1208   K 3.8 10/27/2023 0433   CL 92 (L) 10/27/2023 0433   CO2 25 10/27/2023 0433   GLUCOSE 85 10/27/2023 0433   BUN 51 (H) 10/27/2023 0433   BUN 27 (H) 06/23/2019 1208   CREATININE 6.02 (H) 10/27/2023 0433   CREATININE 3.48 (H) 06/08/2021 0000   CALCIUM  8.3 (L) 10/27/2023 0433   GFRNONAA 7 (L) 10/27/2023 0433   GFRAA 39 (L) 06/23/2019 1208   CBC    Component Value Date/Time   WBC 9.1 10/27/2023 0433   RBC 3.46 (L) 10/27/2023 0433   HGB 9.2 (L) 10/27/2023 0433   HGB 11.1 06/23/2019 1208   HCT 28.6 (L) 10/27/2023 0433   HCT 33.2 (L) 06/23/2019 1208   PLT 196 10/27/2023 0433   PLT 257 06/23/2019 1208   MCV 82.7 10/27/2023 0433   MCV 81 06/23/2019 1208   MCH 26.6 10/27/2023 0433   MCHC 32.2 10/27/2023 0433   RDW 15.0 10/27/2023 0433   RDW 12.4 06/23/2019 1208   LYMPHSABS 0.8 10/22/2023 2327   LYMPHSABS 1.8 06/23/2019 1208   MONOABS 0.7 10/22/2023 2327   EOSABS 0.1 10/22/2023  2327   EOSABS 0.1 06/23/2019 1208   BASOSABS 0.0 10/22/2023 2327   BASOSABS 0.0 06/23/2019 1208    Assessment 63F with historical CKD 5 now progressing to uremia, significant azotemia.   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  At length discussed this with patient and daughter.  She finally decided to proceed with dialysis and had RIJ TDC placed by RI followed by short HD sessionon 10/23/23.  Plan for ongoing serial HD which will be progressively longer and faster to prevent dialysis dysequilibrium syndrome.  She is high risk given her advanced uremia and significant azotemia.  Appreciate efforts of renal navigator to arrange for outpatient dialysis.  I have consulted [facility] for access placement.  Seen by Dr. Gretta this morning and plan for AVF or AVG tomorrow morning.   once more stable.  Had 3rd HD session 10/25/23.  Plan for next HD on Monday after surgery. Will continue with ongoing education and discuss home modalities but daughter expresses some concerns about her suitability to do this.  This could all be sorted out and something like her transitional care unit if she does begin dialysis.  Extensive discussion with the patient regarding goals of care. Would not replace potassium Can give some gentle hydration in case she is hypovolemic with her poor oral intake and nausea/vomiting TSAT 13%, will start ESA and follow as well as IV venofer  200 mg RIJ TDC placed by IR on 10/23/23 Will check phosphorus and iPTH.  Fairy RONAL Sellar, MD [facility]

## 2023-10-28 ENCOUNTER — Encounter (HOSPITAL_COMMUNITY)
Admission: EM | Disposition: A | Discharge status: Skilled Nursing Facility | Payer: Self-pay | Source: Home / Self Care | Attending: Internal Medicine

## 2023-10-28 ENCOUNTER — Encounter (HOSPITAL_COMMUNITY): Payer: Self-pay | Admitting: Internal Medicine

## 2023-10-28 ENCOUNTER — Encounter (HOSPITAL_COMMUNITY): Payer: Self-pay | Admitting: Anesthesiology

## 2023-10-28 ENCOUNTER — Inpatient Hospital Stay (HOSPITAL_COMMUNITY): Payer: Self-pay | Admitting: Anesthesiology

## 2023-10-28 DIAGNOSIS — I5033 Acute on chronic diastolic (congestive) heart failure: Secondary | ICD-10-CM | POA: Diagnosis not present

## 2023-10-28 DIAGNOSIS — D631 Anemia in chronic kidney disease: Secondary | ICD-10-CM | POA: Diagnosis not present

## 2023-10-28 DIAGNOSIS — N185 Chronic kidney disease, stage 5: Secondary | ICD-10-CM | POA: Diagnosis not present

## 2023-10-28 DIAGNOSIS — N186 End stage renal disease: Secondary | ICD-10-CM

## 2023-10-28 DIAGNOSIS — I132 Hypertensive heart and chronic kidney disease with heart failure and with stage 5 chronic kidney disease, or end stage renal disease: Secondary | ICD-10-CM

## 2023-10-28 HISTORY — PX: BASCILIC VEIN TRANSPOSITION: SHX5742

## 2023-10-28 LAB — CBC
HCT: 29.7 % — ABNORMAL LOW (ref 36.0–46.0)
Hemoglobin: 9.6 g/dL — ABNORMAL LOW (ref 12.0–15.0)
MCH: 26.9 pg (ref 26.0–34.0)
MCHC: 32.3 g/dL (ref 30.0–36.0)
MCV: 83.2 fL (ref 80.0–100.0)
Platelets: 233 K/uL (ref 150–400)
RBC: 3.57 MIL/uL — ABNORMAL LOW (ref 3.87–5.11)
RDW: 14.7 % (ref 11.5–15.5)
WBC: 7.7 K/uL (ref 4.0–10.5)
nRBC: 0 % (ref 0.0–0.2)

## 2023-10-28 LAB — GLUCOSE, CAPILLARY
Glucose-Capillary: 138 mg/dL — ABNORMAL HIGH (ref 70–99)
Glucose-Capillary: 119 mg/dL — ABNORMAL HIGH (ref 70–99)
Glucose-Capillary: 101 mg/dL — ABNORMAL HIGH (ref 70–99)
Glucose-Capillary: 82 mg/dL (ref 70–99)
Glucose-Capillary: 174 mg/dL — ABNORMAL HIGH (ref 70–99)

## 2023-10-28 LAB — RENAL FUNCTION PANEL
Albumin: 2.2 g/dL — ABNORMAL LOW (ref 3.5–5.0)
Anion gap: 14 (ref 5–15)
BUN: 72 mg/dL — ABNORMAL HIGH (ref 8–23)
CO2: 24 mmol/L (ref 22–32)
Calcium: 8.5 mg/dL — ABNORMAL LOW (ref 8.9–10.3)
Chloride: 91 mmol/L — ABNORMAL LOW (ref 98–111)
Creatinine, Ser: 7.87 mg/dL — ABNORMAL HIGH (ref 0.44–1.00)
GFR, Estimated: 5 mL/min — ABNORMAL LOW (ref >60)
Glucose, Bld: 156 mg/dL — ABNORMAL HIGH (ref 70–99)
Phosphorus: 4.1 mg/dL (ref 2.5–4.6)
Potassium: 4.7 mmol/L (ref 3.5–5.1)
Sodium: 129 mmol/L — ABNORMAL LOW (ref 135–145)

## 2023-10-28 SURGERY — TRANSPOSITION, VEIN, BASILIC
Anesthesia: Monitor Anesthesia Care | Laterality: Left

## 2023-10-28 MED ORDER — HYDROCODONE-ACETAMINOPHEN 5-325 MG PO TABS
1.0000 | ORAL_TABLET | ORAL | Status: DC | PRN
Start: 2023-10-28 — End: 2023-10-30
  Administered 2023-10-28 – 2023-10-30 (×9): 1 via ORAL
  Filled 2023-10-28 (×7): qty 1

## 2023-10-28 MED ORDER — HEPARIN SODIUM (PORCINE) 5000 UNIT/ML IJ SOLN
5000.0000 [IU] | Freq: Three times a day (TID) | INTRAMUSCULAR | Status: DC
  Administered 2023-10-29 – 2023-10-30 (×3): 5000 [IU] via SUBCUTANEOUS
  Filled 2023-10-28 (×4): qty 1

## 2023-10-28 MED ORDER — OXYCODONE HCL 5 MG PO TABS
5.0000 mg | ORAL_TABLET | ORAL | Status: DC | PRN
  Administered 2023-10-29: 5 mg via ORAL
  Filled 2023-10-28: qty 1

## 2023-10-28 MED ORDER — 0.9 % SODIUM CHLORIDE (POUR BTL) OPTIME
TOPICAL | Status: DC | PRN
  Administered 2023-10-28: 1000 mL

## 2023-10-28 MED ORDER — HYDROCODONE-ACETAMINOPHEN 5-325 MG PO TABS
ORAL_TABLET | ORAL | Status: AC
  Filled 2023-10-28: qty 1

## 2023-10-28 MED ORDER — OXYCODONE HCL 5 MG PO TABS: 5.0000 mg | ORAL_TABLET | Freq: Once | ORAL | Status: DC | PRN

## 2023-10-28 MED ORDER — HEPARIN SODIUM (PORCINE) 1000 UNIT/ML IJ SOLN
3200.0000 [IU] | Freq: Once | INTRAMUSCULAR | Status: AC
  Administered 2023-10-28: 3200 [IU]

## 2023-10-28 MED ORDER — FENTANYL CITRATE (PF) 100 MCG/2ML IJ SOLN: 25.0000 ug | INTRAMUSCULAR | Status: DC | PRN

## 2023-10-28 MED ORDER — HEPARIN 6000 UNIT IRRIGATION SOLUTION
Status: DC | PRN
  Administered 2023-10-28: 1

## 2023-10-28 MED ORDER — PHENYLEPHRINE HCL-NACL 20-0.9 MG/250ML-% IV SOLN
INTRAVENOUS | Status: DC | PRN
  Administered 2023-10-28: 20 ug/min via INTRAVENOUS

## 2023-10-28 MED ORDER — FENTANYL CITRATE (PF) 100 MCG/2ML IJ SOLN
INTRAMUSCULAR | Status: AC
  Filled 2023-10-28: qty 2

## 2023-10-28 MED ORDER — MIDAZOLAM HCL 2 MG/2ML IJ SOLN
INTRAMUSCULAR | Status: AC
  Administered 2023-10-28: 2 mg via INTRAVENOUS
  Filled 2023-10-28: qty 2

## 2023-10-28 MED ORDER — OXYCODONE HCL 5 MG/5ML PO SOLN: 5.0000 mg | Freq: Once | ORAL | Status: DC | PRN

## 2023-10-28 MED ORDER — MIDAZOLAM HCL (PF) 2 MG/2ML IJ SOLN: 2.0000 mg | Freq: Once | INTRAMUSCULAR | Status: AC

## 2023-10-28 MED ORDER — PROPOFOL 500 MG/50ML IV EMUL
INTRAVENOUS | Status: DC | PRN
  Administered 2023-10-28: 40 ug/kg/min via INTRAVENOUS

## 2023-10-28 MED ORDER — LEVETIRACETAM 250 MG PO TABS
1000.0000 mg | ORAL_TABLET | ORAL | Status: DC
  Administered 2023-10-28 – 2023-10-29 (×2): 1000 mg via ORAL
  Filled 2023-10-28 (×2): qty 4

## 2023-10-28 MED ORDER — ORAL CARE MOUTH RINSE: 15.0000 mL | Freq: Once | OROMUCOSAL | Status: AC

## 2023-10-28 MED ORDER — ROPIVACAINE HCL 5 MG/ML IJ SOLN
INTRAMUSCULAR | Status: DC | PRN
  Administered 2023-10-28: 25 mL via PERINEURAL

## 2023-10-28 MED ORDER — HEPARIN SODIUM (PORCINE) 1000 UNIT/ML IJ SOLN
INTRAMUSCULAR | Status: DC | PRN
  Administered 2023-10-28: 3000 [IU] via INTRAVENOUS

## 2023-10-28 MED ORDER — CHLORHEXIDINE GLUCONATE 0.12 % MT SOLN: 15.0000 mL | Freq: Once | OROMUCOSAL | Status: AC

## 2023-10-28 MED ORDER — HEPARIN 6000 UNIT IRRIGATION SOLUTION
Status: AC
Start: 2023-10-28 — End: 2023-10-28
  Filled 2023-10-28: qty 500

## 2023-10-28 MED ORDER — CEFAZOLIN SODIUM-DEXTROSE 1-4 GM/50ML-% IV SOLN
INTRAVENOUS | Status: DC | PRN
  Administered 2023-10-28: 2 g via INTRAVENOUS

## 2023-10-28 MED ORDER — CHLORHEXIDINE GLUCONATE 0.12 % MT SOLN
OROMUCOSAL | Status: AC
  Administered 2023-10-28: 15 mL via OROMUCOSAL
  Filled 2023-10-28: qty 15

## 2023-10-28 MED ORDER — SODIUM CHLORIDE 0.9 % IV SOLN: INTRAVENOUS | Status: DC

## 2023-10-28 MED ORDER — HEPARIN SODIUM (PORCINE) 1000 UNIT/ML IJ SOLN
INTRAMUSCULAR | Status: AC
  Filled 2023-10-28: qty 4

## 2023-10-28 SURGICAL SUPPLY — 32 items
ARMBAND PINK RESTRICT EXTREMIT (MISCELLANEOUS) ×2 IMPLANT
BAG COUNTER SPONGE SURGICOUNT (BAG) ×1 IMPLANT
BLADE CLIPPER SURG (BLADE) ×1 IMPLANT
CANISTER SUCTION 3000ML PPV (SUCTIONS) ×1 IMPLANT
CLIP TI MEDIUM 6 (CLIP) ×1 IMPLANT
CLIP TI WIDE RED SMALL 6 (CLIP) ×1 IMPLANT
COVER PROBE W GEL 5X96 (DRAPES) ×1 IMPLANT
DERMABOND ADVANCED .7 DNX12 (GAUZE/BANDAGES/DRESSINGS) ×1 IMPLANT
ELECTRODE REM PT RTRN 9FT ADLT (ELECTROSURGICAL) ×1 IMPLANT
GLOVE BIO SURGEON STRL SZ7 (GLOVE) IMPLANT
GLOVE BIO SURGEON STRL SZ7.5 (GLOVE) ×1 IMPLANT
GLOVE BIOGEL PI IND STRL 6.5 (GLOVE) IMPLANT
GLOVE BIOGEL PI IND STRL 7.0 (GLOVE) IMPLANT
GLOVE BIOGEL PI IND STRL 8 (GLOVE) ×1 IMPLANT
GOWN STRL REUS W/ TWL LRG LVL3 (GOWN DISPOSABLE) ×2 IMPLANT
GOWN STRL REUS W/ TWL XL LVL3 (GOWN DISPOSABLE) ×2 IMPLANT
KIT BASIN OR (CUSTOM PROCEDURE TRAY) ×1 IMPLANT
KIT TURNOVER KIT B (KITS) ×1 IMPLANT
LOOP VESSEL MINI RED (MISCELLANEOUS) IMPLANT
PACK CV ACCESS (CUSTOM PROCEDURE TRAY) ×1 IMPLANT
PAD ARMBOARD POSITIONER FOAM (MISCELLANEOUS) ×2 IMPLANT
SLING ARM FOAM STRAP LRG (SOFTGOODS) IMPLANT
SOLN 0.9% NACL POUR BTL 1000ML (IV SOLUTION) ×1 IMPLANT
SOLN STERILE WATER BTL 1000 ML (IV SOLUTION) ×1 IMPLANT
SPIKE FLUID TRANSFER (MISCELLANEOUS) ×1 IMPLANT
SUT MNCRL AB 4-0 PS2 18 (SUTURE) ×1 IMPLANT
SUT PROLENE 6 0 BV (SUTURE) ×1 IMPLANT
SUT PROLENE 7 0 BV 1 (SUTURE) IMPLANT
SUT SILK 2 0 PERMA HAND 18 BK (SUTURE) IMPLANT
SUT VIC AB 3-0 SH 27X BRD (SUTURE) ×2 IMPLANT
TOWEL GREEN STERILE (TOWEL DISPOSABLE) ×1 IMPLANT
UNDERPAD 30X36 HEAVY ABSORB (UNDERPADS AND DIAPERS) ×1 IMPLANT

## 2023-10-28 NOTE — Progress Notes (Signed)
 [facility] Progress Note    Assessment/ Plan:   CKD 5, now ESRD -started HD for uremia via [facility] 10/15 -1st stage BB AVF created with Dr. Gretta on 10/20--appreciate assistance -CLIP: accepted for [facility] -HD tomorrow to keep on TTS schedule  Uremia -improving with dialysis initiation  HTN -will UF as tolerated  Acidosis -improved  Anemia of CKD -on ESA -Hgb stable -checking iron  panel -transfuse prn for hgb <7  CKD MBD -phos WNL, checking PTH  Incidental adrenal nodule -per primary  Subjective:   Patient seen and examined on dialysis, no complaints. S/p 1st stage avf this AM.   Objective:   BP (!) 164/68   Pulse 77   Temp 97.9 F (36.6 C)   Resp 14   Ht 5' (1.524 m)   Wt 54 kg   SpO2 100%   BMI 23.25 kg/m   Intake/Output Summary (Last 24 hours) at 10/28/2023 1544 Last data filed at 10/28/2023 1152 Gross per 24 hour  Intake 740 ml  Output --  Net 740 ml   Weight change:   Physical Exam: Gen: NAD CVS: RRR Resp: normal wob, unlabored Abd: soft, nt/nd Ext: no edema Neuro: awake, alert Dialysis access: RIJ TDC in use, LUE AVF (wrapped)  Imaging: VAS US  UPPER EXT VEIN MAPPING (PRE-OP AVF) Result Date: 10/28/2023 UPPER EXTREMITY VEIN MAPPING Patient Name:  Jill Jefferson  Date of Exam:   10/27/2023 Medical Rec #: [id number]        Accession #:    [id number] Date of Birth: 11/18/1960         Patient Gender: F Patient Age:   63 years Exam Location:  [facility] Procedure:      VAS US  UPPER EXT VEIN MAPPING (PRE-OP AVF) Referring Phys: [facility] --------------------------------------------------------------------------------  History: PMH of DMII, seizures, HTN, and CKD.  Performing Technologist: Ricka Sturdivant-Jones RDMS, RVT  Examination Guidelines: A complete evaluation includes B-mode imaging, spectral Doppler, color Doppler, and power Doppler as needed of all accessible portions of each vessel. Bilateral testing is considered  an integral part of a complete examination. Limited examinations for reoccurring indications may be performed as noted. +-----------------+-------------+----------+---------+ Right Cephalic   Diameter (cm)Depth (cm)Findings  +-----------------+-------------+----------+---------+ Shoulder             0.26        0.78             +-----------------+-------------+----------+---------+ Prox upper arm       0.30        0.81             +-----------------+-------------+----------+---------+ Mid upper arm        0.19        0.46   branching +-----------------+-------------+----------+---------+ Dist upper arm       0.19        0.33             +-----------------+-------------+----------+---------+ Antecubital fossa    0.29        0.23             +-----------------+-------------+----------+---------+ Prox forearm         0.32        0.22             +-----------------+-------------+----------+---------+ Mid forearm          0.35        0.32             +-----------------+-------------+----------+---------+ Dist forearm  0.32        0.26             +-----------------+-------------+----------+---------+ Wrist                0.21        0.26             +-----------------+-------------+----------+---------+ +-----------------+-------------+----------+---------+ Right Basilic    Diameter (cm)Depth (cm)Findings  +-----------------+-------------+----------+---------+ Shoulder             0.67        0.73             +-----------------+-------------+----------+---------+ Prox upper arm       0.45        1.01   branching +-----------------+-------------+----------+---------+ Mid upper arm        0.50        1.11             +-----------------+-------------+----------+---------+ Dist upper arm       0.46        0.75             +-----------------+-------------+----------+---------+ Antecubital fossa    0.49        0.62   branching  +-----------------+-------------+----------+---------+ Prox forearm         0.29        0.20             +-----------------+-------------+----------+---------+ Mid forearm          0.34        0.18             +-----------------+-------------+----------+---------+ Distal forearm       0.20        0.23             +-----------------+-------------+----------+---------+ Wrist                0.28        1.18             +-----------------+-------------+----------+---------+ +-----------------+-------------+----------+--------+ Left Cephalic    Diameter (cm)Depth (cm)Findings +-----------------+-------------+----------+--------+ Prox upper arm       2.18        0.67            +-----------------+-------------+----------+--------+ Mid upper arm        0.16        0.62            +-----------------+-------------+----------+--------+ Dist upper arm       0.25        0.39            +-----------------+-------------+----------+--------+ Antecubital fossa    0.30        0.55            +-----------------+-------------+----------+--------+ Prox forearm         0.34        0.43            +-----------------+-------------+----------+--------+ Mid forearm          0.30        0.33   thrombus +-----------------+-------------+----------+--------+ Dist forearm         0.32        0.26            +-----------------+-------------+----------+--------+ Wrist                0.21        0.21            +-----------------+-------------+----------+--------+ +-----------------+-------------+----------+--------------+ Left Basilic  Diameter (cm)Depth (cm)   Findings    +-----------------+-------------+----------+--------------+ Shoulder                                not visualized +-----------------+-------------+----------+--------------+ Prox upper arm       0.71        0.91                  +-----------------+-------------+----------+--------------+ Mid  upper arm        0.53        0.89                  +-----------------+-------------+----------+--------------+ Dist upper arm       0.40        1.01                  +-----------------+-------------+----------+--------------+ Antecubital fossa    0.38        0.79     branching    +-----------------+-------------+----------+--------------+ Prox forearm         0.35        0.16                  +-----------------+-------------+----------+--------------+ Mid forearm          0.28        0.16                  +-----------------+-------------+----------+--------------+ Distal forearm       0.25        0.17                  +-----------------+-------------+----------+--------------+ Wrist                0.21                              +-----------------+-------------+----------+--------------+ *See table(s) above for measurements and observations.  Diagnosing physician: Lonni Gaskins MD Electronically signed by Lonni Gaskins MD on 10/28/2023 at 1:16:05 PM.    Final     Labs: BMET Recent Labs  Lab 10/22/23 9244 10/22/23 2327 10/24/23 9571 10/25/23 9585 10/26/23 0402 10/27/23 0433 10/28/23 0435  NA 131* 127* 130* 130* 131* 130* 129*  K 2.8* 3.5 3.7 3.8 3.8 3.8 4.7  CL 76* 73* 86* 92* 96* 92* 91*  CO2 34* 31 30 24 26 25 24   GLUCOSE 57* 229* 95 178* 145* 85 156*  BUN 230* 244* 122* 69* 36* 51* 72*  CREATININE 15.10* 14.69* 9.46* 6.60* 4.52* 6.02* 7.87*  CALCIUM  7.7* 7.3* 8.3* 8.2* 8.2* 8.3* 8.5*  PHOS  --   --   --   --  3.5 4.1 4.1   CBC Recent Labs  Lab 10/22/23 2327 10/24/23 0428 10/25/23 0414 10/26/23 0402 10/27/23 0433 10/28/23 0435  WBC 8.7   < > 8.4 8.7 9.1 7.7  NEUTROABS 7.0  --   --   --   --   --   HGB 8.7*   < > 8.0* 7.7* 9.2* 9.6*  HCT 25.5*   < > 24.5* 24.3* 28.6* 29.7*  MCV 76.6*   < > 80.9 82.7 82.7 83.2  PLT 199   < > 197 195 196 233   < > = values in this interval not displayed.    Medications:     carvedilol   6.25 mg Oral  BID WC   Chlorhexidine  Gluconate Cloth  6 each Topical Q0600   darbepoetin (ARANESP ) injection - DIALYSIS  60 mcg Subcutaneous Q Fri-1800   feeding supplement  237 mL Oral TID BM   [START ON 10/29/2023] heparin  injection (subcutaneous)  5,000 Units Subcutaneous Q8H   insulin  aspart  0-5 Units Subcutaneous QHS   insulin  aspart  0-9 Units Subcutaneous TID WC   insulin  glargine-yfgn  4 Units Subcutaneous Daily   levETIRAcetam   1,000 mg Oral Q24H   multivitamin  1 tablet Oral QHS      Ephriam Stank, MD [facility] 10/28/2023, 3:44 PM

## 2023-10-28 NOTE — Op Note (Signed)
 OPERATIVE NOTE   DATE: October 28, 2023  PROCEDURE: left first stage basilic vein transposition (brachiobasilic arteriovenous fistula) placement  PRE-OPERATIVE DIAGNOSIS: ESRD  POST-OPERATIVE DIAGNOSIS: same  SURGEON: Lonni DOROTHA Gaskins, MD  ASSISTANT(S): Ahmed Holster, PA  ANESTHESIA: regional  ESTIMATED BLOOD LOSS: Minimal  FINDING(S): 1.  Basilic vein: 3 mm, acceptable 2.  Brachial artery: 3.5 mm, atherosclerotic disease evident 3.  Venous outflow: palpable thrill  4.  Radial flow: palpable radial pulse  SPECIMEN(S):  none  INDICATIONS:   Jill Jefferson is a 63 y.o. female who presents with ESRD and the need for permanent hemodialysis access.  The patient is scheduled for left arm AVF versus AVG.  The patient is aware the risks include but are not limited to: bleeding, infection, steal syndrome, nerve damage, ischemic monomelic neuropathy, failure to mature, and need for additional procedures.  The patient is aware of the risks of the procedure and elects to proceed forward.  An assistant was needed given the complexity of the case and also for the anastomosis.   DESCRIPTION: After full informed written consent was obtained from the patient, the patient was brought back to the operating room and placed supine upon the operating table.  Prior to induction, the patient received IV antibiotics.   After obtaining adequate anesthesia, the patient was then prepped and draped in the standard fashion for a left arm access procedure.  I turned my attention first to identifying the patient's cephalic vein which was too small.  I elected to use the basilic vein at the elbow.      I made a transverse incision at the level of the antecubitum and dissected through the subcutaneous tissue and fascia to gain exposure of the brachial artery.  This was noted to be 3.5 mm in diameter externally.  This was dissected out proximally and distally and controlled with vessel loops .  I then  dissected out the basilic vein.  This was noted to be 3 mm in diameter externally.  The distal segment of the vein was ligated with a  2-0 silk, and the vein was transected.  The proximal segment was interrogated with serial dilators.  The vein accepted up to a 4 mm dilator without any difficulty.  I then instilled the heparinized saline into the vein and clamped it.  At this point, I reset my exposure of the brachial artery.  The patient was given 3,000 units IV heparin .  I then placed the artery under tension proximally and distally.  I made an arteriotomy with a #11 blade, and then I extended the arteriotomy with a Potts scissor.  I injected heparinized saline proximal and distal to this arteriotomy.  The vein was then sewn to the artery in an end-to-side configuration with a running stitch of 6-0 Prolene with the help of my assistant.  Prior to completing this anastomosis, I allowed the vein and artery to backbleed.  There was no evidence of clot from any vessels.  I completed the anastomosis in the usual fashion and then released all vessel loops and clamps.    There was a palpable thrill in the venous outflow, and there was a palpable radial pulse.  At this point, I irrigated out the surgical wound.  There was no further active bleeding.  The subcutaneous tissue was reapproximated with a running stitch of 3-0 Vicryl.  The skin was then reapproximated with a running subcuticular stitch of 4-0 Monocryl.  The skin was then cleaned, dried, and reinforced with  Dermabond.  The patient tolerated this procedure well.   COMPLICATIONS: None  CONDITION: Stable  Lonni DOROTHA Gaskins MD [facility] Office: [phone]  Lonni JINNY Gaskins   10/28/2023, 12:29 PM

## 2023-10-28 NOTE — Plan of Care (Signed)
  Problem: Education: Goal: Ability to describe self-care measures that may prevent or decrease complications (Diabetes Survival Skills Education) will improve Outcome: Progressing Goal: Individualized Educational Video(s) Outcome: Progressing   Problem: Coping: Goal: Ability to adjust to condition or change in health will improve Outcome: Progressing   Problem: Fluid Volume: Goal: Ability to maintain a balanced intake and output will improve Outcome: Progressing   Problem: Health Behavior/Discharge Planning: Goal: Ability to identify and utilize available resources and services will improve Outcome: Progressing Goal: Ability to manage health-related needs will improve Outcome: Progressing   Problem: Metabolic: Goal: Ability to maintain appropriate glucose levels will improve Outcome: Progressing   Problem: Nutritional: Goal: Maintenance of adequate nutrition will improve Outcome: Progressing Goal: Progress toward achieving an optimal weight will improve Outcome: Progressing   Problem: Skin Integrity: Goal: Risk for impaired skin integrity will decrease Outcome: Progressing   Problem: Tissue Perfusion: Goal: Adequacy of tissue perfusion will improve Outcome: Progressing   Problem: Education: Goal: Knowledge of General Education information will improve Description: Including pain rating scale, medication(s)/side effects and non-pharmacologic comfort measures Outcome: Progressing   Problem: Health Behavior/Discharge Planning: Goal: Ability to manage health-related needs will improve Outcome: Progressing   Problem: Clinical Measurements: Goal: Ability to maintain clinical measurements within normal limits will improve Outcome: Progressing Goal: Will remain free from infection Outcome: Progressing Goal: Diagnostic test results will improve Outcome: Progressing Goal: Respiratory complications will improve Outcome: Progressing Goal: Cardiovascular complication will  be avoided Outcome: Progressing   Problem: Activity: Goal: Risk for activity intolerance will decrease Outcome: Progressing   Problem: Nutrition: Goal: Adequate nutrition will be maintained Outcome: Progressing   Problem: Coping: Goal: Level of anxiety will decrease Outcome: Progressing   Problem: Elimination: Goal: Will not experience complications related to bowel motility Outcome: Progressing Goal: Will not experience complications related to urinary retention Outcome: Progressing   Problem: Pain Managment: Goal: General experience of comfort will improve and/or be controlled Outcome: Progressing   Problem: Safety: Goal: Ability to remain free from injury will improve Outcome: Progressing   Problem: Skin Integrity: Goal: Risk for impaired skin integrity will decrease Outcome: Progressing   Problem: Education: Goal: Knowledge of disease and its progression will improve Outcome: Progressing Goal: Individualized Educational Video(s) Outcome: Progressing   Problem: Fluid Volume: Goal: Compliance with measures to maintain balanced fluid volume will improve Outcome: Progressing   Problem: Health Behavior/Discharge Planning: Goal: Ability to manage health-related needs will improve Outcome: Progressing   Problem: Nutritional: Goal: Ability to make healthy dietary choices will improve Outcome: Progressing   Problem: Clinical Measurements: Goal: Complications related to the disease process, condition or treatment will be avoided or minimized Outcome: Progressing

## 2023-10-28 NOTE — Progress Notes (Signed)
 [facility] PROGRESS NOTE    Progress Note  [name]  FMW:[id number] DOB: 06/12/1960 DOA: 10/21/2023 PCP: Shelda Atlas, MD     Brief Narrative:   Jill Jefferson is an 63 y.o. female past medical history significant for chronic kidney disease stage V, seizure disorder, HFrEF with a last EF of 40%, chronic kidney disease stage V, insulin -dependent diabetes mellitus type 2 recently discharged from the hospital on October 2025 for heart failure exacerbation and acute kidney injury, during that time she was hesitant to start hemodialysis, she was discharged on high-dose diuretic therapy comes in for dizziness generalized progressive weakness nausea vomiting and intermittent chest tightness, her workup was significant for progressive renal failure, started on hemodialysis.   Assessment/Plan:   CKD stage V, new ESRD  Uremia  Hypokalemia  - Renal input greatly appreciated, palliative care input greatly appreciated, patient decided HD trial . - HD access by IR 10/15, started HD on 10/15, so far she received 3 dialysis back-to-back, plan for next HD tomorrow - Further management per renal - Uremia symptoms much improved, her mentation much cleared, no further nausea, and her appetite much improved . - Plan for left arm AV fistula versus graft this afternoon by vascular surgery .  Anemia of chronic renal disease/microcytic anemia: - no signs of overt bleeding. -  required 2 units PRBC transfusion so far, hemoglobin stable at 9.2.   - IV iron  and Procrit per renal   Hyponatremia - Improving with dialysis  Elevated lipase: Denies any abdominal pain CT scan showed no acute findings. Continue antiemetics. - Trending down  Elevated troponins: In the setting of chronic renal disease Twelve-lead EKG showed no evidence of ischemia.  Incidental adrenal nodule: CT scan of the abdomen pelvis showed 16mm right adrenal nodule. Will need further imaging for  characterization.  Essential hypertension: Continue Coreg  holding diuretics. Use hydralazine  as needed.  Seizure disorder: Continue Keppra .  Chronic HFrEF: Last EF showed a 2D echo 40%. Chest x-ray showed no pulmonary edema. - Volume management with HD  Insulin -dependent diabetes mellitus type 2: Controlled with last A1c 9.1 With poor appetite initially, so kept on insulin  sliding scale, but now that has improved, CBG started to increase, so we will start on long-acting insulin , but will keep dose on the lower side as n.p.o. for HD access .       DVT prophylaxis:  subcu heparin   Family Communication: None at bedside, spoke with daughter Wanda by phone 10/19 Status is: Inpatient Remains inpatient appropriate because: CKD V    Code Status:     Code Status Orders  (From admission, onward)           Start     Ordered   10/21/23 2254  Full code  Continuous       Question:  By:  Answer:  Consent: discussion documented in EHR   10/21/23 2255           Code Status History     Date Active Date Inactive Code Status Order ID Comments User Context   07/12/2023 1559 07/18/2023 1649 Full Code [id number]  Seena Marsa [name], MD Inpatient   05/10/2021 2124 05/14/2021 0106 Full Code [id number]  Marcene Eva [name], DO ED   09/01/2020 2043 09/08/2020 0017 Full Code [id number]  Seena Marsa [name], MD ED         IV Access:   Peripheral IV   Procedures and diagnostic studies:   VAS US  UPPER EXT VEIN MAPPING (PRE-OP AVF) Result Date: 10/27/2023  UPPER EXTREMITY VEIN MAPPING Patient Name:  Jill Jefferson  Date of Exam:   10/27/2023 Medical Rec #: [id number]        Accession #:    [id number] Date of Birth: 1960-02-28         Patient Gender: F Patient Age:   51 years Exam Location:  [facility] Procedure:      VAS US  UPPER EXT VEIN MAPPING (PRE-OP AVF) Referring Phys: [facility] --------------------------------------------------------------------------------  History: PMH  of DMII, seizures, HTN, and CKD.  Performing Technologist: Ricka Sturdivant-Jones RDMS, RVT  Examination Guidelines: A complete evaluation includes B-mode imaging, spectral Doppler, color Doppler, and power Doppler as needed of all accessible portions of each vessel. Bilateral testing is considered an integral part of a complete examination. Limited examinations for reoccurring indications may be performed as noted. +-----------------+-------------+----------+---------+ Right Cephalic   Diameter (cm)Depth (cm)Findings  +-----------------+-------------+----------+---------+ Shoulder             0.26        0.78             +-----------------+-------------+----------+---------+ Prox upper arm       0.30        0.81             +-----------------+-------------+----------+---------+ Mid upper arm        0.19        0.46   branching +-----------------+-------------+----------+---------+ Dist upper arm       0.19        0.33             +-----------------+-------------+----------+---------+ Antecubital fossa    0.29        0.23             +-----------------+-------------+----------+---------+ Prox forearm         0.32        0.22             +-----------------+-------------+----------+---------+ Mid forearm          0.35        0.32             +-----------------+-------------+----------+---------+ Dist forearm         0.32        0.26             +-----------------+-------------+----------+---------+ Wrist                0.21        0.26             +-----------------+-------------+----------+---------+ +-----------------+-------------+----------+---------+ Right Basilic    Diameter (cm)Depth (cm)Findings  +-----------------+-------------+----------+---------+ Shoulder             0.67        0.73             +-----------------+-------------+----------+---------+ Prox upper arm       0.45        1.01   branching  +-----------------+-------------+----------+---------+ Mid upper arm        0.50        1.11             +-----------------+-------------+----------+---------+ Dist upper arm       0.46        0.75             +-----------------+-------------+----------+---------+ Antecubital fossa    0.49        0.62   branching +-----------------+-------------+----------+---------+ Prox forearm         0.29  0.20             +-----------------+-------------+----------+---------+ Mid forearm          0.34        0.18             +-----------------+-------------+----------+---------+ Distal forearm       0.20        0.23             +-----------------+-------------+----------+---------+ Wrist                0.28        1.18             +-----------------+-------------+----------+---------+ +-----------------+-------------+----------+--------+ Left Cephalic    Diameter (cm)Depth (cm)Findings +-----------------+-------------+----------+--------+ Prox upper arm       2.18        0.67            +-----------------+-------------+----------+--------+ Mid upper arm        0.16        0.62            +-----------------+-------------+----------+--------+ Dist upper arm       0.25        0.39            +-----------------+-------------+----------+--------+ Antecubital fossa    0.30        0.55            +-----------------+-------------+----------+--------+ Prox forearm         0.34        0.43            +-----------------+-------------+----------+--------+ Mid forearm          0.30        0.33   thrombus +-----------------+-------------+----------+--------+ Dist forearm         0.32        0.26            +-----------------+-------------+----------+--------+ Wrist                0.21        0.21            +-----------------+-------------+----------+--------+ +-----------------+-------------+----------+--------------+ Left Basilic     Diameter (cm)Depth  (cm)   Findings    +-----------------+-------------+----------+--------------+ Shoulder                                not visualized +-----------------+-------------+----------+--------------+ Prox upper arm       0.71        0.91                  +-----------------+-------------+----------+--------------+ Mid upper arm        0.53        0.89                  +-----------------+-------------+----------+--------------+ Dist upper arm       0.40        1.01                  +-----------------+-------------+----------+--------------+ Antecubital fossa    0.38        0.79     branching    +-----------------+-------------+----------+--------------+ Prox forearm         0.35        0.16                  +-----------------+-------------+----------+--------------+ Mid forearm          0.28  0.16                  +-----------------+-------------+----------+--------------+ Distal forearm       0.25        0.17                  +-----------------+-------------+----------+--------------+ Wrist                0.21                              +-----------------+-------------+----------+--------------+ *See table(s) above for measurements and observations.  Diagnosing physician:    Preliminary       Medical Consultants:   Nephrology Interventional radiology Vascular surgery   Subjective:    Mitsuko Craddock no nausea, no vomiting, appetite has improved, she is asking to increase her pain meds for chronic lower extremities pain  Objective:    Vitals:   10/28/23 0359 10/28/23 0400 10/28/23 0401 10/28/23 0700  BP:  (!) 166/75  122/70  Pulse: 76 75 75   Resp: 13 (!) 8 13   Temp:  98.8 F (37.1 C)  98.6 F (37 C)  TempSrc:  Oral  Oral  SpO2: 100% 98% 100%   Weight:      Height:       SpO2: 100 % O2 Flow Rate (L/min): 2 L/min   Intake/Output Summary (Last 24 hours) at 10/28/2023 0931 Last data filed at 10/27/2023 1613 Gross per 24 hour  Intake  240 ml  Output --  Net 240 ml   Filed Weights   10/23/23 1657 10/24/23 1350 10/25/23 1455  Weight: 52.6 kg 51.5 kg 54 kg    Exam:   Awake Alert, Oriented X 3, sitting in recliner in no apparent distress Good air entry posteriorly No Cyanosis, Clubbing or edema     Data Reviewed:    Labs: Basic Metabolic Panel: Recent Labs  Lab 10/22/23 2327 10/24/23 0428 10/25/23 0414 10/26/23 0402 10/27/23 0433 10/28/23 0435  NA 127* 130* 130* 131* 130* 129*  K 3.5 3.7 3.8 3.8 3.8 4.7  CL 73* 86* 92* 96* 92* 91*  CO2 31 30 24 26 25 24   GLUCOSE 229* 95 178* 145* 85 156*  BUN 244* 122* 69* 36* 51* 72*  CREATININE 14.69* 9.46* 6.60* 4.52* 6.02* 7.87*  CALCIUM  7.3* 8.3* 8.2* 8.2* 8.3* 8.5*  MG 2.2  --   --   --   --   --   PHOS  --   --   --  3.5 4.1 4.1   GFR Estimated Creatinine Clearance: 5.4 mL/min (A) (by C-G formula based on SCr of 7.87 mg/dL (H)). Liver Function Tests: Recent Labs  Lab 10/21/23 1652 10/22/23 2327 10/26/23 0402 10/27/23 0433 10/28/23 0435  AST 23 18  --   --   --   ALT 29 21  --   --   --   ALKPHOS 118 69  --   --   --   BILITOT 0.4 1.0  --   --   --   PROT 7.3 6.4*  --   --   --   ALBUMIN 3.6 2.3* 2.0* 2.1* 2.2*   Recent Labs  Lab 10/21/23 1652 10/24/23 0428  LIPASE 463* 94*   No results for input(s): AMMONIA in the last 168 hours. Coagulation profile No results for input(s): INR, PROTIME in the last 168 hours. COVID-19 Labs  No results for input(s): DDIMER,  FERRITIN, LDH, CRP in the last 72 hours.   Lab Results  Component Value Date   SARSCOV2NAA NEGATIVE 07/11/2023   SARSCOV2NAA NEGATIVE 09/01/2020    CBC: Recent Labs  Lab 10/22/23 2327 10/24/23 0428 10/25/23 0414 10/26/23 0402 10/27/23 0433 10/28/23 0435  WBC 8.7 9.4 8.4 8.7 9.1 7.7  NEUTROABS 7.0  --   --   --   --   --   HGB 8.7* 9.0* 8.0* 7.7* 9.2* 9.6*  HCT 25.5* 27.5* 24.5* 24.3* 28.6* 29.7*  MCV 76.6* 79.5* 80.9 82.7 82.7 83.2  PLT 199 208 197 195  196 233   Cardiac Enzymes: No results for input(s): CKTOTAL, CKMB, CKMBINDEX, TROPONINI in the last 168 hours. BNP (last 3 results) Recent Labs    07/11/23 1625  PROBNP 33,608.0*   CBG: Recent Labs  Lab 10/27/23 0743 10/27/23 1216 10/27/23 1613 10/27/23 2144 10/28/23 0725  GLUCAP 113* 274* 225* 137* 138*   D-Dimer: No results for input(s): DDIMER in the last 72 hours. Hgb A1c: No results for input(s): HGBA1C in the last 72 hours.  Lipid Profile: No results for input(s): CHOL, HDL, LDLCALC, TRIG, CHOLHDL, LDLDIRECT in the last 72 hours. Thyroid  function studies: No results for input(s): TSH, T4TOTAL, T3FREE, THYROIDAB in the last 72 hours.  Invalid input(s): FREET3 Anemia work up: No results for input(s): VITAMINB12, FOLATE, FERRITIN, TIBC, IRON , RETICCTPCT in the last 72 hours.  Sepsis Labs: Recent Labs  Lab 10/25/23 0414 10/26/23 0402 10/27/23 0433 10/28/23 0435  WBC 8.4 8.7 9.1 7.7   Microbiology No results found for this or any previous visit (from the past 240 hours).   Medications:    carvedilol   6.25 mg Oral BID WC   Chlorhexidine  Gluconate Cloth  6 each Topical Q0600   darbepoetin (ARANESP ) injection - DIALYSIS  60 mcg Subcutaneous Q Fri-1800   feeding supplement  237 mL Oral TID BM   heparin  injection (subcutaneous)  5,000 Units Subcutaneous Q8H   insulin  aspart  0-5 Units Subcutaneous QHS   insulin  aspart  0-9 Units Subcutaneous TID WC   insulin  glargine-yfgn  4 Units Subcutaneous Daily   levETIRAcetam   1,000 mg Oral Q24H   multivitamin  1 tablet Oral QHS   Continuous Infusions:      LOS: 7 days   Amaris Delafuente  [facility]  10/28/2023, 9:31 AM

## 2023-10-28 NOTE — Progress Notes (Signed)
 [facility] [place] [facility]  Subjective  - no complaints   Objective 122/61 72 99.2 F (37.3 C) (Oral) 16 98%  Intake/Output Summary (Last 24 hours) at 10/28/2023 1044 Last data filed at 10/27/2023 1613 Gross per 24 hour  Intake 240 ml  Output --  Net 240 ml    Bilateral radial pulses palpable  Laboratory Lab Results: Recent Labs    10/27/23 0433 10/28/23 0435  WBC 9.1 7.7  HGB 9.2* 9.6*  HCT 28.6* 29.7*  PLT 196 233   BMET Recent Labs    10/27/23 0433 10/28/23 0435  NA 130* 129*  K 3.8 4.7  CL 92* 91*  CO2 25 24  GLUCOSE 85 156*  BUN 51* 72*  CREATININE 6.02* 7.87*  CALCIUM  8.3* 8.5*    COAG Lab Results  Component Value Date   INR 0.9 05/10/2021   No results found for: PTT  Assessment/Planning:  63 year old female with stage V CKD now ESRD that vascular surgery has been consulted for permanent access placement.  She has a TDC placed by IR that is working.  She is right-handed.  Discussed plan for left arm AV fistula versus graft today.     Lonni JINNY Gaskins 10/28/2023 10:44 AM --

## 2023-10-28 NOTE — Plan of Care (Signed)
 Palliative:  Continue to follow peripherally. Tolerating HD and plans for vascular intervention for fistula vs graft. No current palliative needs noted. Please call [phone] for further palliative needs.   No charge  Bernarda Kitty, NP Palliative Medicine Team Pager [phone] (Please see [email or web address] for schedule) Team Phone [phone]

## 2023-10-28 NOTE — Anesthesia Procedure Notes (Signed)
 Anesthesia Regional Block: Supraclavicular block   Pre-Anesthetic Checklist: , timeout performed,  Correct Patient, Correct Site, Correct Laterality,  Correct Procedure, Correct Position, site marked,  Risks and benefits discussed,  Surgical consent,  Pre-op evaluation,  At surgeon's request and post-op pain management  Laterality: Left  Prep: chloraprep       Needles:  Injection technique: Single-shot  Needle Type: Echogenic Stimulator Needle     Needle Length: 9cm  Needle Gauge: 21     Additional Needles:   Procedures:,,,, ultrasound used (permanent image in chart),,    Narrative:  Start time: 10/28/2023 10:46 AM End time: 10/28/2023 10:51 AM Injection made incrementally with aspirations every 5 mL.  Performed by: Personally  Anesthesiologist: Peggye Delon Brunswick, MD  Additional Notes: Discussed risks and benefits of nerve block including, but not limited to, prolonged and/or permanent nerve injury involving sensory and/or motor function. Monitors were applied and a time-out was performed. The nerve and associated structures were visualized under ultrasound guidance. After negative aspiration, local anesthetic was slowly injected around the nerve. There was no evidence of high pressure during the procedure. There were no paresthesias. VSS remained stable and the patient tolerated the procedure well.

## 2023-10-28 NOTE — Transfer of Care (Signed)
 Immediate Anesthesia Transfer of Care Note  Patient: Jill Jefferson  Procedure(s) Performed: TRANSPOSITION, VEIN, BASILIC FIRST STAGE (Left)  Patient Location: PACU  Anesthesia Type:MAC combined with regional for post-op pain  Level of Consciousness: awake, alert , and oriented  Airway & Oxygen Therapy: Patient Spontanous Breathing  Post-op Assessment: Report given to RN and Post -op Vital signs reviewed and stable  Post vital signs: Reviewed and stable  Last Vitals:  Vitals Value Taken Time  BP 125/59   Temp 98   Pulse 73   Resp 16   SpO2 98     Last Pain:  Vitals:   10/28/23 1100  TempSrc:   PainSc: 0-No pain      Patients Stated Pain Goal: 2 (10/27/23 0432)  Complications: No notable events documented.

## 2023-10-28 NOTE — TOC Progression Note (Addendum)
 Transition of Care [facility]) - Progression Note    Patient Details  Name: Jerilyn Gillaspie MRN: [id number] Date of Birth: [birth year]  [facility] CM/SW Contact  Inocente GORMAN Kindle, LCSW Phone Number: 10/28/2023, 8:46 AM  Clinical Narrative:    8:46 AM-Awaiting response from [facility] on if they can transport patient to [facility] dialysis center.   12:25 PM-Renal Navigator able to secure Dialysis OP at [facility]. CSW sent schedule to [facility] and awaiting confirmation that they can begin insurance process today.   12:49 PM-[facility] confirmed they will begin insurance process.    Expected Discharge Plan: Skilled Nursing Facility Barriers to Discharge: Continued Medical Work up, English as a second language teacher, SNF Pending bed offer (HD center coordination with SNF)               Expected Discharge Plan and Services In-house Referral: Clinical Social Work   Post Acute Care Choice: Skilled Nursing Facility Living arrangements for the past 2 months: Apartment                                       Social Drivers of Health (SDOH) Interventions SDOH Screenings   Food Insecurity: No Food Insecurity (10/22/2023)  Housing: Unknown (10/22/2023)  Transportation Needs: Patient Declined (10/22/2023)  Utilities: Patient Declined (10/22/2023)  Depression (PHQ2-9): Low Risk  (02/25/2019)  Tobacco Use: Low Risk  (10/21/2023)    Readmission Risk Interventions     No data to display

## 2023-10-28 NOTE — Progress Notes (Addendum)
 Contacted local HD clinic to confirm pt's out-pt HD acceptance. Awaiting a return call from staff. Will assist as needed.   Randine Mungo Dialysis Navigator [phone]  Addendum at 12:58 pm: Pt has been accepted at [facility] on TTS 10:15 am chair time. Pt can start as soon as tomorrow and will need to arrive at 9:30 am for first appt to complete paperwork prior to treatment. This info was provided to nephrologist, renal PA, and CSW for d/c planning purposes. Pt currently off the floor in a procedure. Will attempt to meet with pt/family later today vs tomorrow morning to discuss above details.   Addendum at 2:30 pm: Message left for pt's daughter, Wanda, requesting a return call to discuss pt's out-pt HD arrangements.   Addendum at 2:40 pm: Spoke to pt's daughter via phone to discuss out-pt HD arrangements. Info text to pt's daughter as well. HD info added to pt's AVS.

## 2023-10-28 NOTE — Progress Notes (Signed)
 Physical Therapy Treatment Patient Details Name: Jill Jefferson MRN: [id number] DOB: Aug 26, 1960 Today's Date: 10/28/2023   History of Present Illness Pt is a 63 year old female who comes in for dizziness generalized progressive weakness nausea vomiting and intermittent chest tightness, her workup was significant for progressive renal failure, started on hemodialysis. Past medical history significant for chronic kidney disease stage V, seizure disorder, HFrEF with a last EF of 40%, chronic kidney disease stage V, insulin -dependent diabetes mellitus type 2 recently discharged from the hospital on October 2025 for heart failure exacerbation and acute kidney injury, during that time she was hesitant to start hemodialysis, she was discharged on high-dose diuretic therapy.    PT Comments  Pt in recliner on arrival. She is NPO this morning for AVF placement. Pt will then undergo HD. Pt agreeable to participation in therapy. She required min assist transfers, and min assist amb 30' with RW. Gait distance limited by fatigue. Pt returned to recliner at end of session, feet elevated.     If plan is discharge home, recommend the following: A lot of help with walking and/or transfers;A lot of help with bathing/dressing/bathroom;Assistance with cooking/housework;Assist for transportation;Help with stairs or ramp for entrance   Can travel by private vehicle     Yes  Equipment Recommendations  Rolling walker (2 wheels)    Recommendations for Other Services       Precautions / Restrictions Precautions Precautions: Fall Recall of Precautions/Restrictions: Intact     Mobility  Bed Mobility               General bed mobility comments: Pt received in recliner    Transfers Overall transfer level: Needs assistance Equipment used: Rolling walker (2 wheels) Transfers: Sit to/from Stand Sit to Stand: Min assist           General transfer comment: cues for sequencing     Ambulation/Gait Ambulation/Gait assistance: Contact guard assist Gait Distance (Feet): 30 Feet Assistive device: Rolling walker (2 wheels) Gait Pattern/deviations: Step-through pattern, Decreased stride length Gait velocity: decreased Gait velocity interpretation: <1.8 ft/sec, indicate of risk for recurrent falls   General Gait Details: slow, tedious gait. No LOB noted. Fatigued quickly   Comptroller Bed    Modified Rankin (Stroke Patients Only)       Balance Overall balance assessment: Needs assistance Sitting-balance support: No upper extremity supported, Feet supported Sitting balance-Leahy Scale: Fair     Standing balance support: Bilateral upper extremity supported, During functional activity, Reliant on assistive device for balance Standing balance-Leahy Scale: Poor                              Communication Communication Communication: No apparent difficulties  Cognition Arousal: Alert Behavior During Therapy: Flat affect   PT - Cognitive impairments: No apparent impairments                         Following commands: Intact      Cueing Cueing Techniques: Verbal cues, Tactile cues  Exercises      General Comments General comments (skin integrity, edema, etc.): VSS on RA      Pertinent Vitals/Pain Pain Assessment Pain Assessment: No/denies pain    Home Living  Prior Function            PT Goals (current goals can now be found in the care plan section) Acute Rehab PT Goals Patient Stated Goal: to get stronger Progress towards PT goals: Progressing toward goals    Frequency    Min 2X/week      PT Plan      Co-evaluation              AM-PAC PT 6 Clicks Mobility   Outcome Measure  Help needed turning from your back to your side while in a flat bed without using bedrails?: A Little Help needed moving from lying on your back  to sitting on the side of a flat bed without using bedrails?: A Little Help needed moving to and from a bed to a chair (including a wheelchair)?: A Little Help needed standing up from a chair using your arms (e.g., wheelchair or bedside chair)?: A Little Help needed to walk in hospital room?: A Little Help needed climbing 3-5 steps with a railing? : Total 6 Click Score: 16    End of Session Equipment Utilized During Treatment: Gait belt Activity Tolerance: Patient tolerated treatment well Patient left: in chair;with call bell/phone within reach;with chair alarm set Nurse Communication: Mobility status PT Visit Diagnosis: Other abnormalities of gait and mobility (R26.89)     Time: 9195-9170 PT Time Calculation (min) (ACUTE ONLY): 25 min  Charges:    $Gait Training: 23-37 mins PT General Charges $$ ACUTE PT VISIT: 1 Visit                     Sari MATSU., PT  Office # [phone]    Erven Sari Shaker 10/28/2023, 8:44 AM

## 2023-10-28 NOTE — Procedures (Signed)
 I was present at this dialysis session. I have reviewed the session itself and made appropriate changes.   Filed Weights   10/24/23 1350 10/25/23 1455 10/28/23 1411  Weight: 51.5 kg 54 kg 54 kg    Recent Labs  Lab 10/28/23 0435  NA 129*  K 4.7  CL 91*  CO2 24  GLUCOSE 156*  BUN 72*  CREATININE 7.87*  CALCIUM  8.5*  PHOS 4.1    Recent Labs  Lab 10/22/23 2327 10/24/23 0428 10/26/23 0402 10/27/23 0433 10/28/23 0435  WBC 8.7   < > 8.7 9.1 7.7  NEUTROABS 7.0  --   --   --   --   HGB 8.7*   < > 7.7* 9.2* 9.6*  HCT 25.5*   < > 24.3* 28.6* 29.7*  MCV 76.6*   < > 82.7 82.7 83.2  PLT 199   < > 195 196 233   < > = values in this interval not displayed.    Scheduled Meds:  carvedilol   6.25 mg Oral BID WC   Chlorhexidine  Gluconate Cloth  6 each Topical Q0600   darbepoetin (ARANESP ) injection - DIALYSIS  60 mcg Subcutaneous Q Fri-1800   feeding supplement  237 mL Oral TID BM   [START ON 10/29/2023] heparin  injection (subcutaneous)  5,000 Units Subcutaneous Q8H   insulin  aspart  0-5 Units Subcutaneous QHS   insulin  aspart  0-9 Units Subcutaneous TID WC   insulin  glargine-yfgn  4 Units Subcutaneous Daily   levETIRAcetam   1,000 mg Oral Q24H   multivitamin  1 tablet Oral QHS   Continuous Infusions: PRN Meds:.acetaminophen , docusate sodium, HYDROcodone-acetaminophen , ondansetron (ZOFRAN) IV, mouth rinse, oxyCODONE, tiZANidine   Ephriam Stank, MD [facility] 10/28/2023, 3:44 PM

## 2023-10-28 NOTE — Anesthesia Postprocedure Evaluation (Signed)
 Anesthesia Post Note  Patient: Jill Jefferson  Procedure(s) Performed: TRANSPOSITION, VEIN, BASILIC FIRST STAGE (Left)     Patient location during evaluation: PACU Anesthesia Type: Regional and MAC Level of consciousness: awake Pain management: pain level controlled Vital Signs Assessment: post-procedure vital signs reviewed and stable Respiratory status: spontaneous breathing, nonlabored ventilation and respiratory function stable Cardiovascular status: stable and blood pressure returned to baseline Postop Assessment: no apparent nausea or vomiting Anesthetic complications: no   No notable events documented.  Last Vitals:  Vitals:   10/28/23 1300 10/28/23 1332  BP: (!) 129/58 134/60  Pulse: 72 72  Resp: 14 13  Temp: 36.8 C 36.8 C  SpO2: 98% 96%    Last Pain:  Vitals:   10/28/23 1332  TempSrc: Oral  PainSc:                  Delon Aisha Arch

## 2023-10-28 NOTE — Anesthesia Preprocedure Evaluation (Addendum)
 Anesthesia Evaluation  Patient identified by MRN, date of birth, ID band Patient awake    Reviewed: Allergy & Precautions, NPO status , Patient's Chart, lab work & pertinent test results  History of Anesthesia Complications Negative for: history of anesthetic complications  Airway Mallampati: III  TM Distance: >3 FB Neck ROM: Full    Dental  (+) Dental Advisory Given   Pulmonary neg pulmonary ROS   Pulmonary exam normal breath sounds clear to auscultation       Cardiovascular hypertension (carvedilol ), Pt. on home beta blockers (-) angina +CHF (EF 40-45%)  (-) Past MI, (-) Cardiac Stents and (-) CABG + Valvular Problems/Murmurs (mild) AI  Rhythm:Regular Rate:Normal  TTE 07/13/2023: IMPRESSIONS    1. Left ventricular ejection fraction, by estimation, is 40 to 45%. The  left ventricle has mildly decreased function. The left ventricle  demonstrates regional wall motion abnormalities (see scoring  diagram/findings for description). Apical akinesis.  There is mild left ventricular hypertrophy. Left ventricular diastolic  parameters are indeterminate.   2. Right ventricular systolic function is normal. The right ventricular  size is normal. There is moderately elevated pulmonary artery systolic  pressure. The estimated right ventricular systolic pressure is 45.2 mmHg.   3. Left atrial size was mildly dilated.   4. Moderate pleural effusion in the left lateral region.   5. The mitral valve is normal in structure. Trivial mitral valve  regurgitation. No evidence of mitral stenosis.   6. The aortic valve is tricuspid. Aortic valve regurgitation is mild. No  aortic stenosis is present.   7. The inferior vena cava is normal in size with greater than 50%  respiratory variability, suggesting right atrial pressure of 3 mmHg.     Neuro/Psych Seizures - (on Keppra ), Well Controlled,  H/o brain surgery    GI/Hepatic negative GI ROS, Neg  liver ROS,,,  Endo/Other  diabetes, Type 2, Insulin  Dependent    Renal/GU ESRF and DialysisRenal disease     Musculoskeletal   Abdominal   Peds  Hematology  (+) Blood dyscrasia, anemia Lab Results      Component                Value               Date                      WBC                      7.7                 10/28/2023                HGB                      9.6 (L)             10/28/2023                HCT                      29.7 (L)            10/28/2023                MCV                      83.2  10/28/2023                PLT                      233                 10/28/2023              Anesthesia Other Findings   Reproductive/Obstetrics                              Anesthesia Physical Anesthesia Plan  ASA: 3  Anesthesia Plan: MAC and Regional   Post-op Pain Management: Regional block* and Tylenol  PO (pre-op)*   Induction: Intravenous  PONV Risk Score and Plan: 2 and Ondansetron, Propofol infusion, TIVA, Midazolam  and Treatment may vary due to age or medical condition  Airway Management Planned: Simple Face Mask and Natural Airway  Additional Equipment:   Intra-op Plan:   Post-operative Plan:   Informed Consent: I have reviewed the patients History and Physical, chart, labs and discussed the procedure including the risks, benefits and alternatives for the proposed anesthesia with the patient or authorized representative who has indicated his/her understanding and acceptance.     Dental advisory given  Plan Discussed with: CRNA and Anesthesiologist  Anesthesia Plan Comments: (Discussed with patient risks of MAC including, but not limited to, minor pain or discomfort, hearing people in the room, and possible need for backup general anesthesia. Risks for general anesthesia also discussed including, but not limited to, sore throat, hoarse voice, chipped/damaged teeth, injury to vocal cords, nausea and  vomiting, allergic reactions, lung infection, heart attack, stroke, and death. All questions answered.  Discussed potential risks of nerve blocks including, but not limited to, infection, bleeding, nerve damage, seizures, pneumothorax, respiratory depression, and potential failure of the block. Alternatives to nerve blocks discussed. All questions answered. )         Anesthesia Quick Evaluation

## 2023-10-28 NOTE — Progress Notes (Signed)
   10/28/23 1804  Vitals  Temp 98.2 F (36.8 C)  Pulse Rate 84  Resp 15  BP (!) 149/59  SpO2 100 %  O2 Device Room Air  Weight 54 kg  Oxygen Therapy  Patient Activity (if Appropriate) In bed  Pulse Oximetry Type Continuous  Oximetry Probe Site Changed No  Post Treatment  Dialyzer Clearance Lightly streaked  Liters Processed 84  Fluid Removed (mL) 0 mL  Tolerated HD Treatment Yes   Received patient in bed to unit.  Alert and oriented.  Informed consent signed and in chart.   TX duration:3.5  Patient tolerated well.  Transported back to the room  Alert, without acute distress.  Hand-off given to patient's nurse.   Access used: RIJDLC Access issues: no complications  Total UF removed: ran even per md order Medication(s) given: vicodin po x 1    Delon LITTIE [facility]

## 2023-10-28 NOTE — Discharge Instructions (Signed)
   [facility]  Discharge Instructions  AV Fistula or Graft Surgery for Dialysis Access  Please refer to the following instructions for your post-procedure care. Your surgeon or physician assistant will discuss any changes with you.  Activity  You may drive the day following your surgery, if you are comfortable and no longer taking prescription pain medication. Resume full activity as the soreness in your incision resolves.  Bathing/Showering  You may shower after you go home. Keep your incision dry for 48 hours. Do not soak in a bathtub, hot tub, or swim until the incision heals completely. You may not shower if you have a hemodialysis catheter.  Incision Care  Clean your incision with mild soap and water after 48 hours. Pat the area dry with a clean towel. You do not need a bandage unless otherwise instructed. Do not apply any ointments or creams to your incision. You may have skin glue on your incision. Do not peel it off. It will come off on its own in about one week. Your arm may swell a bit after surgery. To reduce swelling use pillows to elevate your arm so it is above your heart. Your doctor will tell you if you need to lightly wrap your arm with an ACE bandage.  Diet  Resume your normal diet. There are not special food restrictions following this procedure. In order to heal from your surgery, it is CRITICAL to get adequate nutrition. Your body requires vitamins, minerals, and protein. Vegetables are the best source of vitamins and minerals. Vegetables also provide the perfect balance of protein. Processed food has little nutritional value, so try to avoid this.  Medications  Resume taking all of your medications. If your incision is causing pain, you may take over-the counter pain relievers such as acetaminophen  (Tylenol ). If you were prescribed a stronger pain medication, please be aware these medications can cause nausea and constipation. Prevent  nausea by taking the medication with a snack or meal. Avoid constipation by drinking plenty of fluids and eating foods with high amount of fiber, such as fruits, vegetables, and grains.  Do not take Tylenol  if you are taking prescription pain medications.  Follow up Your surgeon may want to see you in the office following your access surgery. If so, this will be arranged at the time of your surgery.  Please call us  immediately for any of the following conditions:  Increased pain, redness, drainage (pus) from your incision site Fever of 101 degrees or higher Severe or worsening pain at your incision site Hand pain or numbness.  Reduce your risk of vascular disease:  Stop smoking. If you would like help, call [facility] at 1-800-QUIT-NOW (941-196-2110) or  at 515-099-4005  Manage your cholesterol Maintain a desired weight Control your diabetes Keep your blood pressure down  Dialysis  It will take several weeks to several months for your new dialysis access to be ready for use. Your surgeon will determine when it is okay to use it. Your nephrologist will continue to direct your dialysis. You can continue to use your Permcath until your new access is ready for use.   10/28/2023 Jill Jefferson [id number] Apr 10, 1960  Surgeon(s): Gretta Lonni PARAS, MD  Procedure(s): TRANSPOSITION, VEIN, BASILIC FIRST STAGE   May stick graft immediately   May stick graft on designated area only:   x Do not stick fistula for 12 weeks    If you have any questions, please call the office at [phone].

## 2023-10-29 ENCOUNTER — Encounter (HOSPITAL_COMMUNITY): Payer: Self-pay | Admitting: Vascular Surgery

## 2023-10-29 DIAGNOSIS — Z7189 Other specified counseling: Secondary | ICD-10-CM | POA: Diagnosis not present

## 2023-10-29 DIAGNOSIS — Z515 Encounter for palliative care: Secondary | ICD-10-CM | POA: Diagnosis not present

## 2023-10-29 DIAGNOSIS — Z992 Dependence on renal dialysis: Secondary | ICD-10-CM

## 2023-10-29 DIAGNOSIS — N186 End stage renal disease: Secondary | ICD-10-CM

## 2023-10-29 DIAGNOSIS — N185 Chronic kidney disease, stage 5: Secondary | ICD-10-CM | POA: Diagnosis not present

## 2023-10-29 LAB — GLUCOSE, CAPILLARY
Glucose-Capillary: 169 mg/dL — ABNORMAL HIGH (ref 70–99)
Glucose-Capillary: 153 mg/dL — ABNORMAL HIGH (ref 70–99)
Glucose-Capillary: 125 mg/dL — ABNORMAL HIGH (ref 70–99)
Glucose-Capillary: 309 mg/dL — ABNORMAL HIGH (ref 70–99)
Glucose-Capillary: 237 mg/dL — ABNORMAL HIGH (ref 70–99)

## 2023-10-29 LAB — CBC
HCT: 26.5 % — ABNORMAL LOW (ref 36.0–46.0)
Hemoglobin: 8.5 g/dL — ABNORMAL LOW (ref 12.0–15.0)
MCH: 27.1 pg (ref 26.0–34.0)
MCHC: 32.1 g/dL (ref 30.0–36.0)
MCV: 84.4 fL (ref 80.0–100.0)
Platelets: 224 K/uL (ref 150–400)
RBC: 3.14 MIL/uL — ABNORMAL LOW (ref 3.87–5.11)
RDW: 14.8 % (ref 11.5–15.5)
WBC: 6 K/uL (ref 4.0–10.5)
nRBC: 0 % (ref 0.0–0.2)

## 2023-10-29 LAB — RENAL FUNCTION PANEL
Albumin: 1.9 g/dL — ABNORMAL LOW (ref 3.5–5.0)
Anion gap: 8 (ref 5–15)
BUN: 27 mg/dL — ABNORMAL HIGH (ref 8–23)
CO2: 28 mmol/L (ref 22–32)
Calcium: 8 mg/dL — ABNORMAL LOW (ref 8.9–10.3)
Chloride: 96 mmol/L — ABNORMAL LOW (ref 98–111)
Creatinine, Ser: 3.86 mg/dL — ABNORMAL HIGH (ref 0.44–1.00)
GFR, Estimated: 13 mL/min — ABNORMAL LOW (ref >60)
Glucose, Bld: 223 mg/dL — ABNORMAL HIGH (ref 70–99)
Phosphorus: 3.2 mg/dL (ref 2.5–4.6)
Potassium: 4 mmol/L (ref 3.5–5.1)
Sodium: 132 mmol/L — ABNORMAL LOW (ref 135–145)

## 2023-10-29 LAB — IRON AND TIBC
Iron: 55 ug/dL (ref 28–170)
Saturation Ratios: 26 % (ref 10.4–31.8)
TIBC: 214 ug/dL — ABNORMAL LOW (ref 250–450)
UIBC: 159 ug/dL

## 2023-10-29 LAB — FERRITIN: Ferritin: 479 ng/mL — ABNORMAL HIGH (ref 11–307)

## 2023-10-29 MED ORDER — DARBEPOETIN ALFA 100 MCG/0.5ML IJ SOSY
100.0000 ug | PREFILLED_SYRINGE | INTRAMUSCULAR | Status: DC
  Filled 2023-10-29: qty 0.5

## 2023-10-29 MED ORDER — HYDROCODONE-ACETAMINOPHEN 5-325 MG PO TABS
ORAL_TABLET | ORAL | Status: AC
  Filled 2023-10-29: qty 1

## 2023-10-29 MED ORDER — HEPARIN SODIUM (PORCINE) 1000 UNIT/ML IJ SOLN
INTRAMUSCULAR | Status: AC
  Filled 2023-10-29: qty 4

## 2023-10-29 MED ORDER — HEPARIN SODIUM (PORCINE) 1000 UNIT/ML IJ SOLN
3200.0000 [IU] | Freq: Once | INTRAMUSCULAR | Status: AC
  Administered 2023-10-29: 3200 [IU]

## 2023-10-29 NOTE — Progress Notes (Addendum)
  Progress Note    10/29/2023 8:37 AM 1 Day Post-Op  Subjective:  no complaints   Vitals:   10/29/23 0807 10/29/23 0830  BP: (!) 142/58 (!) 106/54  Pulse: 78   Resp: 12   Temp:    SpO2: 100%    Physical Exam: Cardiac:  regular Lungs:  non labored Incisions:  left AC incision is clean, dry and intact. No swelling or hematoma. Left AV fistula with good thrill  Extremities:  2+ left radial pulse, motor and sensation intact Neurologic: alert and oriented  CBC    Component Value Date/Time   WBC 6.0 10/29/2023 0815   RBC 3.14 (L) 10/29/2023 0815   HGB 8.5 (L) 10/29/2023 0815   HGB 11.1 06/23/2019 1208   HCT 26.5 (L) 10/29/2023 0815   HCT 33.2 (L) 06/23/2019 1208   PLT 224 10/29/2023 0815   PLT 257 06/23/2019 1208   MCV 84.4 10/29/2023 0815   MCV 81 06/23/2019 1208   MCH 27.1 10/29/2023 0815   MCHC 32.1 10/29/2023 0815   RDW 14.8 10/29/2023 0815   RDW 12.4 06/23/2019 1208   LYMPHSABS 0.8 10/22/2023 2327   LYMPHSABS 1.8 06/23/2019 1208   MONOABS 0.7 10/22/2023 2327   EOSABS 0.1 10/22/2023 2327   EOSABS 0.1 06/23/2019 1208   BASOSABS 0.0 10/22/2023 2327   BASOSABS 0.0 06/23/2019 1208    BMET    Component Value Date/Time   NA 132 (L) 10/29/2023 0335   NA 139 06/23/2019 1208   K 4.0 10/29/2023 0335   CL 96 (L) 10/29/2023 0335   CO2 28 10/29/2023 0335   GLUCOSE 223 (H) 10/29/2023 0335   BUN 27 (H) 10/29/2023 0335   BUN 27 (H) 06/23/2019 1208   CREATININE 3.86 (H) 10/29/2023 0335   CREATININE 3.48 (H) 06/08/2021 0000   CALCIUM  8.0 (L) 10/29/2023 0335   GFRNONAA 13 (L) 10/29/2023 0335   GFRAA 39 (L) 06/23/2019 1208    INR    Component Value Date/Time   INR 0.9 05/10/2021 1747     Intake/Output Summary (Last 24 hours) at 10/29/2023 0837 Last data filed at 10/28/2023 1851 Gross per 24 hour  Intake 500 ml  Output 300 ml  Net 200 ml     Assessment/Plan:  63 y.o. female is s/p Left brachiobasilic AV fistula  1 Day Post-Op   Left AC incision clean,  dry and intact without swelling or hematoma Fistula with good thrill No signs or symptoms of steal She will have follow up arranged in our office in 4-6 weeks with fistula duplex   Teretha Damme, PA-C [facility] [phone] 10/29/2023 8:37 AM  I have seen and evaluated the patient. I agree with the PA note as documented above.  Postop day 1 status post left basilic vein fistula.  Will need follow-up in 4 to 6 weeks with duplex.  If this is maturing nicely will need second stage.  Call vascular with questions or concerns.  Lonni DOROTHA Gaskins, MD [facility] [place] Office: [phone]

## 2023-10-29 NOTE — Plan of Care (Signed)
  Problem: Coping: Goal: Ability to adjust to condition or change in health will improve Outcome: Progressing   Problem: Health Behavior/Discharge Planning: Goal: Ability to manage health-related needs will improve Outcome: Progressing   Problem: Metabolic: Goal: Ability to maintain appropriate glucose levels will improve Outcome: Progressing   Problem: Nutritional: Goal: Maintenance of adequate nutrition will improve Outcome: Progressing   Problem: Skin Integrity: Goal: Risk for impaired skin integrity will decrease Outcome: Progressing   Problem: Education: Goal: Knowledge of General Education information will improve Description: Including pain rating scale, medication(s)/side effects and non-pharmacologic comfort measures Outcome: Progressing   Problem: Health Behavior/Discharge Planning: Goal: Ability to manage health-related needs will improve Outcome: Progressing

## 2023-10-29 NOTE — Progress Notes (Signed)
 Palliative:  HPI: 63 y.o. female  with past medical history of CKD 5, seizure disorder, HFrEF, DM2 on insulin  admitted on 10/21/2023 with weakness, nausea, vomiting. Found to be in ESRD with recommendations for dialysis. Palliative consulted for assistance with medical decision making.     I met today at Jill Jefferson' bedside after completing chart review. Jill Jefferson is awake and alert. I introduced myself from palliative care and that it appears she is doing well with her dialysis and I wanted to just check on her. She confirms she is feeling better than she was and nausea is improved as is her appetite. Her body is tolerating dialysis and she is awaiting rehab. She confirms her desire to improve and get back to living her life.   She does ask about expectations with dialysis and if this is forever. I did share that she has end stage renal disease and that it is anticipated that she will need dialysis for the rest of her life. I did share that some people over time reach a point in their health where they tell us  that dialysis is not longer giving them the quality of life they desire. Sometimes people do make the choice to stop dialysis. However, we do know that time is usually very limited when dialysis is stopped so this is a significant decision to make but it is always an option for her. Jill Jefferson expresses understanding. She is not happy about having to do dialysis but does not regret the decision to do dialysis at the same time. She denies any other concerns or needs. Emotional support provided.   Exam: Awake, alert. No distress. Oriented. Breathing regular, unlabored. L arm in sling. Abd soft. Generalized weakness and fatigue.   Plan: - Full code - Continue HD - Pursuing SNF rehab with hopes of ongoing improvement  25 min  Jill Kitty, Jill Jefferson Palliative Medicine Team Pager [phone] (Please see [email or web address] for schedule) Team Phone [phone]

## 2023-10-29 NOTE — Progress Notes (Signed)
 [facility] PROGRESS NOTE    Progress Note  Jill Jefferson  FMW:[id number] DOB: [birth year] DOA: 10/21/2023 PCP: Shelda Atlas, MD     Brief Narrative:   Jill Jefferson is an 63 y.o. female past medical history significant for chronic kidney disease stage V, seizure disorder, HFrEF with a last EF of 40%, chronic kidney disease stage V, insulin -dependent diabetes mellitus type 2 recently discharged from the hospital on October 2025 for heart failure exacerbation and acute kidney injury, during that time she was hesitant to start hemodialysis, she was discharged on high-dose diuretic therapy comes in for dizziness generalized progressive weakness nausea vomiting and intermittent chest tightness, her workup was significant for progressive renal failure, started on hemodialysis.   Assessment/Plan:   CKD stage V, new ESRD  Uremia  Hypokalemia  - Renal input greatly appreciated, palliative care input greatly appreciated, patient decided HD trial . - HD access by IR 10/15, started HD on 10/15, so far she received 3 dialysis back-to-back, plan for next HD tomorrow - Further management per renal - Uremia symptoms much improved, her mentation much cleared, no further nausea, and her appetite much improved . - left arm AV fistula by vascular surgery on 10/20.  Anemia of chronic renal disease/microcytic anemia: - no signs of overt bleeding. -  required 2 units PRBC transfusion so far, hemoglobin stable  - IV iron  and Procrit per renal   Hyponatremia - Improving with dialysis  Elevated lipase: Denies any abdominal pain CT scan showed no acute findings. Continue antiemetics. - Trending down  Elevated troponins: In the setting of chronic renal disease Twelve-lead EKG showed no evidence of ischemia.  Incidental adrenal nodule: CT scan of the abdomen pelvis showed 16mm right adrenal nodule. Will need further imaging for characterization.  This can be pursued as an  outpatient.  Essential hypertension: Continue Coreg  holding diuretics. Use hydralazine  as needed.  Seizure disorder: Continue Keppra .  Chronic HFrEF: Last EF showed a 2D echo 40%. Chest x-ray showed no pulmonary edema. - Volume management with HD  Insulin -dependent diabetes mellitus type 2: Controlled with last A1c 9.1 With poor appetite initially, so kept on insulin  sliding scale, but now that has improved, CBG started to increase, so we will start on long-acting insulin , but will keep dose on the lower side as n.p.o. for HD access .       DVT prophylaxis:  subcu heparin   Family Communication: None at bedside, spoke with daughter Wanda by phone 10/19 Status is: Inpatient Remains inpatient appropriate because: CKD V    Code Status:     Code Status Orders  (From admission, onward)           Start     Ordered   10/21/23 2254  Full code  Continuous       Question:  By:  Answer:  Consent: discussion documented in EHR   10/21/23 2255           Code Status History     Date Active Date Inactive Code Status Order ID Comments User Context   07/12/2023 1559 07/18/2023 1649 Full Code [id number]  Seena Marsa [name], MD Inpatient   05/10/2021 2124 05/14/2021 0106 Full Code [id number]  Marcene Eva [name], DO ED   09/01/2020 2043 09/08/2020 0017 Full Code [id number]  Seena Marsa [name], MD ED         IV Access:   Peripheral IV   Procedures and diagnostic studies:   VAS US  UPPER EXT VEIN MAPPING (PRE-OP AVF) Result Date: 10/28/2023  UPPER EXTREMITY VEIN MAPPING Patient Name:  Jill Jefferson  Date of Exam:   10/27/2023 Medical Rec #: [id number]        Accession #:    [id number] Date of Birth: 14-Jun-1960         Patient Gender: F Patient Age:   63 years Exam Location:  [facility] Procedure:      VAS US  UPPER EXT VEIN MAPPING (PRE-OP AVF) Referring Phys: [facility] --------------------------------------------------------------------------------  History: PMH of  DMII, seizures, HTN, and CKD.  Performing Technologist: Ricka Sturdivant-Jones RDMS, RVT  Examination Guidelines: A complete evaluation includes B-mode imaging, spectral Doppler, color Doppler, and power Doppler as needed of all accessible portions of each vessel. Bilateral testing is considered an integral part of a complete examination. Limited examinations for reoccurring indications may be performed as noted. +-----------------+-------------+----------+---------+ Right Cephalic   Diameter (cm)Depth (cm)Findings  +-----------------+-------------+----------+---------+ Shoulder             0.26        0.78             +-----------------+-------------+----------+---------+ Prox upper arm       0.30        0.81             +-----------------+-------------+----------+---------+ Mid upper arm        0.19        0.46   branching +-----------------+-------------+----------+---------+ Dist upper arm       0.19        0.33             +-----------------+-------------+----------+---------+ Antecubital fossa    0.29        0.23             +-----------------+-------------+----------+---------+ Prox forearm         0.32        0.22             +-----------------+-------------+----------+---------+ Mid forearm          0.35        0.32             +-----------------+-------------+----------+---------+ Dist forearm         0.32        0.26             +-----------------+-------------+----------+---------+ Wrist                0.21        0.26             +-----------------+-------------+----------+---------+ +-----------------+-------------+----------+---------+ Right Basilic    Diameter (cm)Depth (cm)Findings  +-----------------+-------------+----------+---------+ Shoulder             0.67        0.73             +-----------------+-------------+----------+---------+ Prox upper arm       0.45        1.01   branching  +-----------------+-------------+----------+---------+ Mid upper arm        0.50        1.11             +-----------------+-------------+----------+---------+ Dist upper arm       0.46        0.75             +-----------------+-------------+----------+---------+ Antecubital fossa    0.49        0.62   branching +-----------------+-------------+----------+---------+ Prox forearm         0.29  0.20             +-----------------+-------------+----------+---------+ Mid forearm          0.34        0.18             +-----------------+-------------+----------+---------+ Distal forearm       0.20        0.23             +-----------------+-------------+----------+---------+ Wrist                0.28        1.18             +-----------------+-------------+----------+---------+ +-----------------+-------------+----------+--------+ Left Cephalic    Diameter (cm)Depth (cm)Findings +-----------------+-------------+----------+--------+ Prox upper arm       2.18        0.67            +-----------------+-------------+----------+--------+ Mid upper arm        0.16        0.62            +-----------------+-------------+----------+--------+ Dist upper arm       0.25        0.39            +-----------------+-------------+----------+--------+ Antecubital fossa    0.30        0.55            +-----------------+-------------+----------+--------+ Prox forearm         0.34        0.43            +-----------------+-------------+----------+--------+ Mid forearm          0.30        0.33   thrombus +-----------------+-------------+----------+--------+ Dist forearm         0.32        0.26            +-----------------+-------------+----------+--------+ Wrist                0.21        0.21            +-----------------+-------------+----------+--------+ +-----------------+-------------+----------+--------------+ Left Basilic     Diameter (cm)Depth  (cm)   Findings    +-----------------+-------------+----------+--------------+ Shoulder                                not visualized +-----------------+-------------+----------+--------------+ Prox upper arm       0.71        0.91                  +-----------------+-------------+----------+--------------+ Mid upper arm        0.53        0.89                  +-----------------+-------------+----------+--------------+ Dist upper arm       0.40        1.01                  +-----------------+-------------+----------+--------------+ Antecubital fossa    0.38        0.79     branching    +-----------------+-------------+----------+--------------+ Prox forearm         0.35        0.16                  +-----------------+-------------+----------+--------------+ Mid forearm          0.28  0.16                  +-----------------+-------------+----------+--------------+ Distal forearm       0.25        0.17                  +-----------------+-------------+----------+--------------+ Wrist                0.21                              +-----------------+-------------+----------+--------------+ *See table(s) above for measurements and observations.  Diagnosing physician: Lonni Gaskins MD Electronically signed by Lonni Gaskins MD on 10/28/2023 at 1:16:05 PM.    Final       Medical Consultants:   Nephrology Interventional radiology Vascular surgery   Subjective:    Jaynia Whistler no nausea, no vomiting, appetite has improved, no further lower ext pain. she is asking to increase her pain meds for chronic lower extremities pain   Vitals:   10/29/23 1103 10/29/23 1113 10/29/23 1116 10/29/23 1210  BP: (!) 96/57  (!) 128/58 137/60  Pulse: 72 75 74 81  Resp: 12 16 13    Temp:   97.8 F (36.6 C)   TempSrc:      SpO2: 100% 100% 100% 100%  Weight:   52.5 kg   Height:       SpO2: 100 % O2 Flow Rate (L/min): 2 L/min   Intake/Output  Summary (Last 24 hours) at 10/29/2023 1409 Last data filed at 10/29/2023 1116 Gross per 24 hour  Intake --  Output 1300 ml  Net -1300 ml   Filed Weights   10/28/23 1804 10/29/23 0745 10/29/23 1116  Weight: 54 kg 53.7 kg 52.5 kg    Exam:   Awake Alert, Oriented X 3,NAD CTA RRR ABD soft Ext wih no edema, left arm in a sling      Data Reviewed:    Labs: Basic Metabolic Panel: Recent Labs  Lab 10/22/23 2327 10/24/23 0428 10/25/23 0414 10/26/23 0402 10/27/23 0433 10/28/23 0435 10/29/23 0335  NA 127*   < > 130* 131* 130* 129* 132*  K 3.5   < > 3.8 3.8 3.8 4.7 4.0  CL 73*   < > 92* 96* 92* 91* 96*  CO2 31   < > 24 26 25 24 28   GLUCOSE 229*   < > 178* 145* 85 156* 223*  BUN 244*   < > 69* 36* 51* 72* 27*  CREATININE 14.69*   < > 6.60* 4.52* 6.02* 7.87* 3.86*  CALCIUM  7.3*   < > 8.2* 8.2* 8.3* 8.5* 8.0*  MG 2.2  --   --   --   --   --   --   PHOS  --   --   --  3.5 4.1 4.1 3.2   < > = values in this interval not displayed.   GFR Estimated Creatinine Clearance: 11 mL/min (A) (by C-G formula based on SCr of 3.86 mg/dL (H)). Liver Function Tests: Recent Labs  Lab 10/22/23 2327 10/26/23 0402 10/27/23 0433 10/28/23 0435 10/29/23 0335  AST 18  --   --   --   --   ALT 21  --   --   --   --   ALKPHOS 69  --   --   --   --   BILITOT 1.0  --   --   --   --  PROT 6.4*  --   --   --   --   ALBUMIN 2.3* 2.0* 2.1* 2.2* 1.9*   Recent Labs  Lab 10/24/23 0428  LIPASE 94*   No results for input(s): AMMONIA in the last 168 hours. Coagulation profile No results for input(s): INR, PROTIME in the last 168 hours. COVID-19 Labs  No results for input(s): DDIMER, FERRITIN, LDH, CRP in the last 72 hours.   Lab Results  Component Value Date   SARSCOV2NAA NEGATIVE 07/11/2023   SARSCOV2NAA NEGATIVE 09/01/2020    CBC: Recent Labs  Lab 10/22/23 2327 10/24/23 0428 10/25/23 0414 10/26/23 0402 10/27/23 0433 10/28/23 0435 10/29/23 0815  WBC 8.7   < >  8.4 8.7 9.1 7.7 6.0  NEUTROABS 7.0  --   --   --   --   --   --   HGB 8.7*   < > 8.0* 7.7* 9.2* 9.6* 8.5*  HCT 25.5*   < > 24.5* 24.3* 28.6* 29.7* 26.5*  MCV 76.6*   < > 80.9 82.7 82.7 83.2 84.4  PLT 199   < > 197 195 196 233 224   < > = values in this interval not displayed.   Cardiac Enzymes: No results for input(s): CKTOTAL, CKMB, CKMBINDEX, TROPONINI in the last 168 hours. BNP (last 3 results) Recent Labs    07/11/23 1625  PROBNP 33,608.0*   CBG: Recent Labs  Lab 10/28/23 1852 10/28/23 2120 10/29/23 0620 10/29/23 0728 10/29/23 1222  GLUCAP 82 174* 169* 153* 125*   D-Dimer: No results for input(s): DDIMER in the last 72 hours. Hgb A1c: No results for input(s): HGBA1C in the last 72 hours.  Lipid Profile: No results for input(s): CHOL, HDL, LDLCALC, TRIG, CHOLHDL, LDLDIRECT in the last 72 hours. Thyroid  function studies: No results for input(s): TSH, T4TOTAL, T3FREE, THYROIDAB in the last 72 hours.  Invalid input(s): FREET3 Anemia work up: No results for input(s): VITAMINB12, FOLATE, FERRITIN, TIBC, IRON , RETICCTPCT in the last 72 hours.  Sepsis Labs: Recent Labs  Lab 10/26/23 0402 10/27/23 0433 10/28/23 0435 10/29/23 0815  WBC 8.7 9.1 7.7 6.0   Microbiology No results found for this or any previous visit (from the past 240 hours).   Medications:    carvedilol   6.25 mg Oral BID WC   Chlorhexidine  Gluconate Cloth  6 each Topical Q0600   [START ON 11/01/2023] darbepoetin (ARANESP ) injection - DIALYSIS  100 mcg Subcutaneous Q Fri-1800   feeding supplement  237 mL Oral TID BM   heparin  injection (subcutaneous)  5,000 Units Subcutaneous Q8H   insulin  aspart  0-5 Units Subcutaneous QHS   insulin  aspart  0-9 Units Subcutaneous TID WC   insulin  glargine-yfgn  4 Units Subcutaneous Daily   levETIRAcetam   1,000 mg Oral Q24H   multivitamin  1 tablet Oral QHS   Continuous Infusions:      LOS: 8 days   Caroll Weinheimer  [facility]  10/29/2023, 2:09 PM

## 2023-10-29 NOTE — Progress Notes (Signed)
 [facility] Progress Note    Assessment/ Plan:   CKD 5, now ESRD -started HD for uremia via [facility] 10/15 -1st stage BB AVF created with Dr. Gretta on 10/20--appreciate assistance -CLIP: accepted for [facility] -HD on TTS schedule  Uremia -improved with dialysis initiation  HTN -will UF as tolerated  Acidosis -improved  Anemia of CKD -on ESA -Hgb 8.5 -checking iron  panel -transfuse prn for hgb <7  CKD MBD -phos WNL, checking PTH  Incidental adrenal nodule -per primary  Subjective:   Patient seen and examined on dialysis, reports some leg pain/cramps. No other complaints.   Objective:   BP (!) 133/57   Pulse 75   Temp 98.9 F (37.2 C)   Resp 10   Ht 5' (1.524 m)   Wt 53.7 kg   SpO2 100%   BMI 23.12 kg/m   Intake/Output Summary (Last 24 hours) at 10/29/2023 0902 Last data filed at 10/28/2023 1851 Gross per 24 hour  Intake 500 ml  Output 300 ml  Net 200 ml   Weight change:   Physical Exam: Gen: NAD CVS: RRR Resp: normal wob, unlabored Abd: soft, nt/nd Ext: no edema Neuro: awake, alert Dialysis access: RIJ TDC in use, LUE AVF (wrapped)  Imaging: VAS US  UPPER EXT VEIN MAPPING (PRE-OP AVF) Result Date: 10/28/2023 UPPER EXTREMITY VEIN MAPPING Patient Name:  Jill Jefferson  Date of Exam:   10/27/2023 Medical Rec #: [id number]        Accession #:    [id number] Date of Birth: 14-Dec-1960         Patient Gender: F Patient Age:   63 years Exam Location:  [facility] Procedure:      VAS US  UPPER EXT VEIN MAPPING (PRE-OP AVF) Referring Phys: [facility] --------------------------------------------------------------------------------  History: PMH of DMII, seizures, HTN, and CKD.  Performing Technologist: Ricka Sturdivant-Jones RDMS, RVT  Examination Guidelines: A complete evaluation includes B-mode imaging, spectral Doppler, color Doppler, and power Doppler as needed of all accessible portions of each vessel. Bilateral testing is considered an  integral part of a complete examination. Limited examinations for reoccurring indications may be performed as noted. +-----------------+-------------+----------+---------+ Right Cephalic   Diameter (cm)Depth (cm)Findings  +-----------------+-------------+----------+---------+ Shoulder             0.26        0.78             +-----------------+-------------+----------+---------+ Prox upper arm       0.30        0.81             +-----------------+-------------+----------+---------+ Mid upper arm        0.19        0.46   branching +-----------------+-------------+----------+---------+ Dist upper arm       0.19        0.33             +-----------------+-------------+----------+---------+ Antecubital fossa    0.29        0.23             +-----------------+-------------+----------+---------+ Prox forearm         0.32        0.22             +-----------------+-------------+----------+---------+ Mid forearm          0.35        0.32             +-----------------+-------------+----------+---------+ Dist forearm  0.32        0.26             +-----------------+-------------+----------+---------+ Wrist                0.21        0.26             +-----------------+-------------+----------+---------+ +-----------------+-------------+----------+---------+ Right Basilic    Diameter (cm)Depth (cm)Findings  +-----------------+-------------+----------+---------+ Shoulder             0.67        0.73             +-----------------+-------------+----------+---------+ Prox upper arm       0.45        1.01   branching +-----------------+-------------+----------+---------+ Mid upper arm        0.50        1.11             +-----------------+-------------+----------+---------+ Dist upper arm       0.46        0.75             +-----------------+-------------+----------+---------+ Antecubital fossa    0.49        0.62   branching  +-----------------+-------------+----------+---------+ Prox forearm         0.29        0.20             +-----------------+-------------+----------+---------+ Mid forearm          0.34        0.18             +-----------------+-------------+----------+---------+ Distal forearm       0.20        0.23             +-----------------+-------------+----------+---------+ Wrist                0.28        1.18             +-----------------+-------------+----------+---------+ +-----------------+-------------+----------+--------+ Left Cephalic    Diameter (cm)Depth (cm)Findings +-----------------+-------------+----------+--------+ Prox upper arm       2.18        0.67            +-----------------+-------------+----------+--------+ Mid upper arm        0.16        0.62            +-----------------+-------------+----------+--------+ Dist upper arm       0.25        0.39            +-----------------+-------------+----------+--------+ Antecubital fossa    0.30        0.55            +-----------------+-------------+----------+--------+ Prox forearm         0.34        0.43            +-----------------+-------------+----------+--------+ Mid forearm          0.30        0.33   thrombus +-----------------+-------------+----------+--------+ Dist forearm         0.32        0.26            +-----------------+-------------+----------+--------+ Wrist                0.21        0.21            +-----------------+-------------+----------+--------+ +-----------------+-------------+----------+--------------+ Left Basilic  Diameter (cm)Depth (cm)   Findings    +-----------------+-------------+----------+--------------+ Shoulder                                not visualized +-----------------+-------------+----------+--------------+ Prox upper arm       0.71        0.91                  +-----------------+-------------+----------+--------------+ Mid  upper arm        0.53        0.89                  +-----------------+-------------+----------+--------------+ Dist upper arm       0.40        1.01                  +-----------------+-------------+----------+--------------+ Antecubital fossa    0.38        0.79     branching    +-----------------+-------------+----------+--------------+ Prox forearm         0.35        0.16                  +-----------------+-------------+----------+--------------+ Mid forearm          0.28        0.16                  +-----------------+-------------+----------+--------------+ Distal forearm       0.25        0.17                  +-----------------+-------------+----------+--------------+ Wrist                0.21                              +-----------------+-------------+----------+--------------+ *See table(s) above for measurements and observations.  Diagnosing physician: Lonni Gaskins MD Electronically signed by Lonni Gaskins MD on 10/28/2023 at 1:16:05 PM.    Final     Labs: BMET Recent Labs  Lab 10/22/23 2327 10/24/23 9571 10/25/23 9585 10/26/23 0402 10/27/23 0433 10/28/23 0435 10/29/23 0335  NA 127* 130* 130* 131* 130* 129* 132*  K 3.5 3.7 3.8 3.8 3.8 4.7 4.0  CL 73* 86* 92* 96* 92* 91* 96*  CO2 31 30 24 26 25 24 28   GLUCOSE 229* 95 178* 145* 85 156* 223*  BUN 244* 122* 69* 36* 51* 72* 27*  CREATININE 14.69* 9.46* 6.60* 4.52* 6.02* 7.87* 3.86*  CALCIUM  7.3* 8.3* 8.2* 8.2* 8.3* 8.5* 8.0*  PHOS  --   --   --  3.5 4.1 4.1 3.2   CBC Recent Labs  Lab 10/22/23 2327 10/24/23 0428 10/26/23 0402 10/27/23 0433 10/28/23 0435 10/29/23 0815  WBC 8.7   < > 8.7 9.1 7.7 6.0  NEUTROABS 7.0  --   --   --   --   --   HGB 8.7*   < > 7.7* 9.2* 9.6* 8.5*  HCT 25.5*   < > 24.3* 28.6* 29.7* 26.5*  MCV 76.6*   < > 82.7 82.7 83.2 84.4  PLT 199   < > 195 196 233 224   < > = values in this interval not displayed.    Medications:     carvedilol   6.25 mg Oral BID  WC   Chlorhexidine  Gluconate Cloth  6  each Topical Q0600   darbepoetin (ARANESP ) injection - DIALYSIS  60 mcg Subcutaneous Q Fri-1800   feeding supplement  237 mL Oral TID BM   heparin  injection (subcutaneous)  5,000 Units Subcutaneous Q8H   insulin  aspart  0-5 Units Subcutaneous QHS   insulin  aspart  0-9 Units Subcutaneous TID WC   insulin  glargine-yfgn  4 Units Subcutaneous Daily   levETIRAcetam   1,000 mg Oral Q24H   multivitamin  1 tablet Oral QHS      Ephriam Stank, MD [facility] 10/29/2023, 9:02 AM

## 2023-10-29 NOTE — Progress Notes (Signed)
 OT Cancellation Note  Patient Details Name: Lawrencia Mauney MRN: [id number] DOB: May 09, 1960   Cancelled Treatment:    Reason Eval/Treat Not Completed: Patient at procedure or test/ unavailable (Pt currently off unit for HD. OT to reattempt to see pt at a later time as appropriate/available.)  Margarie Rockey HERO., OTR/L, MA Acute Rehab [phone]   Margarie FORBES Horns 10/29/2023, 9:05 AM

## 2023-10-29 NOTE — Procedures (Signed)
 I was present at this dialysis session. I have reviewed the session itself and made appropriate changes.   Filed Weights   10/28/23 1411 10/28/23 1804 10/29/23 0745  Weight: 54 kg 54 kg 53.7 kg    Recent Labs  Lab 10/29/23 0335  NA 132*  K 4.0  CL 96*  CO2 28  GLUCOSE 223*  BUN 27*  CREATININE 3.86*  CALCIUM  8.0*  PHOS 3.2    Recent Labs  Lab 10/22/23 2327 10/24/23 0428 10/27/23 0433 10/28/23 0435 10/29/23 0815  WBC 8.7   < > 9.1 7.7 6.0  NEUTROABS 7.0  --   --   --   --   HGB 8.7*   < > 9.2* 9.6* 8.5*  HCT 25.5*   < > 28.6* 29.7* 26.5*  MCV 76.6*   < > 82.7 83.2 84.4  PLT 199   < > 196 233 224   < > = values in this interval not displayed.    Scheduled Meds:  carvedilol   6.25 mg Oral BID WC   Chlorhexidine  Gluconate Cloth  6 each Topical Q0600   darbepoetin (ARANESP ) injection - DIALYSIS  60 mcg Subcutaneous Q Fri-1800   feeding supplement  237 mL Oral TID BM   heparin  injection (subcutaneous)  5,000 Units Subcutaneous Q8H   insulin  aspart  0-5 Units Subcutaneous QHS   insulin  aspart  0-9 Units Subcutaneous TID WC   insulin  glargine-yfgn  4 Units Subcutaneous Daily   levETIRAcetam   1,000 mg Oral Q24H   multivitamin  1 tablet Oral QHS   Continuous Infusions: PRN Meds:.acetaminophen , docusate sodium, HYDROcodone-acetaminophen , ondansetron (ZOFRAN) IV, mouth rinse, oxyCODONE, tiZANidine   Ephriam Stank, MD [facility] 10/29/2023, 9:02 AM

## 2023-10-29 NOTE — TOC Progression Note (Signed)
 Transition of Care [facility]) - Progression Note    Patient Details  Name: Jill Jefferson MRN: [id number] Date of Birth: 01/25/1960  Transition of Care [facility]) CM/SW Contact  Inocente GORMAN Kindle, LCSW Phone Number: 10/29/2023, 9:33 AM  Clinical Narrative:    Awaiting insurance approval from [facility].    Expected Discharge Plan: Skilled Nursing Facility Barriers to Discharge: Continued Medical Work up, English as a second language teacher, SNF Pending bed offer (HD center coordination with SNF)               Expected Discharge Plan and Services In-house Referral: Clinical Social Work   Post Acute Care Choice: Skilled Nursing Facility Living arrangements for the past 2 months: Apartment                                       Social Drivers of Health (SDOH) Interventions SDOH Screenings   Food Insecurity: No Food Insecurity (10/22/2023)  Housing: Unknown (10/22/2023)  Transportation Needs: Patient Declined (10/22/2023)  Utilities: Patient Declined (10/22/2023)  Depression (PHQ2-9): Low Risk  (02/25/2019)  Tobacco Use: Low Risk  (10/28/2023)    Readmission Risk Interventions     No data to display

## 2023-10-29 NOTE — Progress Notes (Signed)
 Received patient in bed.Alert and oriented x 4.Consent for hd treatment verified.  Access used: Right hd catheter that worked well.Dressing change done today.  Duration of treatment: 3 hours.  Uf goal : Met 1 liter,tolerated treatment.  Medicine given: Hytdrocodone 1 tab.  Hand off to the patient's nurse with stable condition via transporter.

## 2023-10-30 DIAGNOSIS — E876 Hypokalemia: Secondary | ICD-10-CM

## 2023-10-30 DIAGNOSIS — D649 Anemia, unspecified: Secondary | ICD-10-CM | POA: Diagnosis not present

## 2023-10-30 DIAGNOSIS — N185 Chronic kidney disease, stage 5: Secondary | ICD-10-CM | POA: Diagnosis not present

## 2023-10-30 DIAGNOSIS — E871 Hypo-osmolality and hyponatremia: Secondary | ICD-10-CM | POA: Diagnosis not present

## 2023-10-30 LAB — RENAL FUNCTION PANEL
Albumin: 2.2 g/dL — ABNORMAL LOW (ref 3.5–5.0)
Anion gap: 10 (ref 5–15)
BUN: 32 mg/dL — ABNORMAL HIGH (ref 8–23)
CO2: 28 mmol/L (ref 22–32)
Calcium: 8.4 mg/dL — ABNORMAL LOW (ref 8.9–10.3)
Chloride: 93 mmol/L — ABNORMAL LOW (ref 98–111)
Creatinine, Ser: 4.13 mg/dL — ABNORMAL HIGH (ref 0.44–1.00)
GFR, Estimated: 12 mL/min — ABNORMAL LOW (ref >60)
Glucose, Bld: 143 mg/dL — ABNORMAL HIGH (ref 70–99)
Phosphorus: 3.4 mg/dL (ref 2.5–4.6)
Potassium: 4.1 mmol/L (ref 3.5–5.1)
Sodium: 131 mmol/L — ABNORMAL LOW (ref 135–145)

## 2023-10-30 LAB — GLUCOSE, CAPILLARY
Glucose-Capillary: 175 mg/dL — ABNORMAL HIGH (ref 70–99)
Glucose-Capillary: 282 mg/dL — ABNORMAL HIGH (ref 70–99)

## 2023-10-30 LAB — PARATHYROID HORMONE, INTACT (NO CA): PTH: 328 pg/mL — ABNORMAL HIGH (ref 15–65)

## 2023-10-30 MED ORDER — NOVOLOG FLEXPEN 100 UNIT/ML ~~LOC~~ SOPN: PEN_INJECTOR | SUBCUTANEOUS | Status: AC

## 2023-10-30 MED ORDER — ENSURE PLUS HIGH PROTEIN PO LIQD: 237.0000 mL | Freq: Three times a day (TID) | ORAL | Status: AC

## 2023-10-30 MED ORDER — LEVETIRACETAM 500 MG PO TABS: 1000.0000 mg | ORAL_TABLET | Freq: Two times a day (BID) | ORAL | Status: DC

## 2023-10-30 MED ORDER — RENA-VITE PO TABS: 1.0000 | ORAL_TABLET | Freq: Every day | ORAL | Status: AC

## 2023-10-30 MED ORDER — TRESIBA FLEXTOUCH 100 UNIT/ML ~~LOC~~ SOPN: 4.0000 [IU] | PEN_INJECTOR | Freq: Every day | SUBCUTANEOUS | Status: AC

## 2023-10-30 MED ORDER — LEVETIRACETAM 500 MG PO TABS: 1000.0000 mg | ORAL_TABLET | Freq: Every day | ORAL | Status: AC

## 2023-10-30 NOTE — TOC Transition Note (Addendum)
 Transition of Care [facility]) - Discharge Note   Patient Details  Name: Jill Jefferson MRN: [id number] Date of Birth: 06-05-1960  Transition of Care [facility]) CM/SW Contact:  Inocente GORMAN Kindle, LCSW Phone Number: 10/30/2023, 12:03 PM   Clinical Narrative:    Patient will DC to: [facility] Anticipated DC date: 10/30/23 Family notified: Daughter, Public house manager by: ROME   Per MD patient ready for DC to [facility]. RN to call report prior to discharge [phone], room 308B). RN, patient, patient's family, and facility notified of DC. Discharge Summary and FL2 sent to facility. DC packet on chart. Ambulance transport requested for patient.   CSW will sign off for now as social work intervention is no longer needed. Please consult us  again if new needs arise.     Final next level of care: Skilled Nursing Facility Barriers to Discharge: Barriers Resolved   Patient Goals and CMS Choice Patient states their goals for this hospitalization and ongoing recovery are:: Rehab CMS Medicare.gov Compare Post Acute Care list provided to:: Patient Represenative (must comment) Choice offered to / list presented to : Adult Children Edna ownership interest in [facility].provided to:: Adult Children    Discharge Placement   Existing PASRR number confirmed : 10/30/23          Patient chooses bed at:  [facility] Patient to be transferred to facility by: [facility] Name of family member notified: Daughter, Byron Patient and family notified of of transfer: 10/30/23  Discharge Plan and Services Additional resources added to the After Visit Summary for   In-house Referral: Clinical Social Work   Post Acute Care Choice: Skilled Nursing Facility                               Social Drivers of Health (SDOH) Interventions SDOH Screenings   Food Insecurity: No Food Insecurity (10/22/2023)  Housing: Unknown (10/22/2023)  Transportation Needs: Patient Declined (10/22/2023)   Utilities: Patient Declined (10/22/2023)  Depression (PHQ2-9): Low Risk  (02/25/2019)  Tobacco Use: Low Risk  (10/28/2023)     Readmission Risk Interventions     No data to display

## 2023-10-30 NOTE — Progress Notes (Signed)
  [facility] Progress Note    Assessment/ Plan:   CKD 5, now ESRD -started HD for uremia via [facility] 10/15 -1st stage BB AVF created with Dr. Gretta on 10/20--appreciate assistance. Post op follow up with [facility] outpatient on 12/11/23 -CLIP: accepted for [facility], start tomorrow as outpatient -HD on TTS schedule  Uremia -improved with dialysis initiation  HTN -will UF as tolerated  Acidosis -improved  Anemia of CKD -on ESA -Hgb 8.5 -iron  replete on panel -transfuse prn for hgb <7  CKD MBD -phos WNL, PTH-pending  Incidental adrenal nodule -per primary  Discussed with primary service.  Subjective:   Patient seen and examined bedside. Sister in room. No complaints, leg pain is better. Tolerated HD yesterday with net UF 1L Open for discharge, accepted to SNF   Objective:   BP 136/61 (BP Location: Left Arm)   Pulse 77   Temp 99.1 F (37.3 C) (Oral)   Resp 13   Ht 5' (1.524 m)   Wt 52.5 kg   SpO2 99%   BMI 22.60 kg/m  No intake or output data in the 24 hours ending 10/30/23 1120  Weight change: -0.3 kg  Physical Exam: Gen: NAD, sitting in chair CVS: RRR Resp: normal wob, unlabored Abd: soft, nt/nd Ext: no edema Neuro: awake, alert Dialysis access: RIJ TDC, LUE AVF (wrapped & in sling)  Imaging: No results found.   Labs: BMET Recent Labs  Lab 10/24/23 0428 10/25/23 0414 10/26/23 0402 10/27/23 0433 10/28/23 0435 10/29/23 0335 10/30/23 0519  NA 130* 130* 131* 130* 129* 132* 131*  K 3.7 3.8 3.8 3.8 4.7 4.0 4.1  CL 86* 92* 96* 92* 91* 96* 93*  CO2 30 24 26 25 24 28 28   GLUCOSE 95 178* 145* 85 156* 223* 143*  BUN 122* 69* 36* 51* 72* 27* 32*  CREATININE 9.46* 6.60* 4.52* 6.02* 7.87* 3.86* 4.13*  CALCIUM  8.3* 8.2* 8.2* 8.3* 8.5* 8.0* 8.4*  PHOS  --   --  3.5 4.1 4.1 3.2 3.4   CBC Recent Labs  Lab 10/26/23 0402 10/27/23 0433 10/28/23 0435 10/29/23 0815  WBC 8.7 9.1 7.7 6.0  HGB 7.7* 9.2* 9.6* 8.5*  HCT 24.3* 28.6* 29.7* 26.5*   MCV 82.7 82.7 83.2 84.4  PLT 195 196 233 224    Medications:     carvedilol   6.25 mg Oral BID WC   Chlorhexidine  Gluconate Cloth  6 each Topical Q0600   [START ON 11/01/2023] darbepoetin (ARANESP ) injection - DIALYSIS  100 mcg Subcutaneous Q Fri-1800   feeding supplement  237 mL Oral TID BM   heparin  injection (subcutaneous)  5,000 Units Subcutaneous Q8H   insulin  aspart  0-5 Units Subcutaneous QHS   insulin  aspart  0-9 Units Subcutaneous TID WC   insulin  glargine-yfgn  4 Units Subcutaneous Daily   levETIRAcetam   1,000 mg Oral Q24H   multivitamin  1 tablet Oral QHS      Ephriam Stank, MD [facility] 10/30/2023, 11:20 AM

## 2023-10-30 NOTE — Plan of Care (Signed)
  Problem: Skin Integrity: Goal: Risk for impaired skin integrity will decrease Outcome: Progressing   Problem: Tissue Perfusion: Goal: Adequacy of tissue perfusion will improve Outcome: Progressing   Problem: Education: Goal: Knowledge of General Education information will improve Description: Including pain rating scale, medication(s)/side effects and non-pharmacologic comfort measures Outcome: Progressing   Problem: Health Behavior/Discharge Planning: Goal: Ability to manage health-related needs will improve Outcome: Progressing   Problem: Clinical Measurements: Goal: Will remain free from infection Outcome: Progressing Goal: Respiratory complications will improve Outcome: Progressing Goal: Cardiovascular complication will be avoided Outcome: Progressing   Problem: Activity: Goal: Risk for activity intolerance will decrease Outcome: Progressing   Problem: Nutrition: Goal: Adequate nutrition will be maintained Outcome: Progressing

## 2023-10-30 NOTE — Progress Notes (Signed)
 [facility]  Initial Hemodialysis Orders  Dialysis center: [facility]   Patient's name: Jill Jefferson DOB: December 24, 1960 AKI or ESRD: ESRD  Discharge diagnosis: ESRD 2.  Allergies: No Known Allergies  Date of First Dialysis: 10/23/23 Cause of renal disease: diabetes mellitus  Dialysis Prescription: Dialysis Frequency: TIW Tx duration: 4 hours  BFR: 400 DFR: 800 (Add titration orders here if needed) EDW: 53.5kg  Dialyzer: 180NRe UF profile/Sodium modeling?: no Dialysis Bath: 2 K 2 Ca  Dialysis access: Access type: TDC, maturing AVF Date placed: 10/28/23 Surgeon: Dr Gretta Needle gauge: n/a  In Center Medications: Heparin  Dose: none  Type: n/a VDRA: Calcitriol /Hectorol (none) q HD Venofer : per protocol Mircera: 100 mcg IV q 2 weeks Next dose due: 11/01/23 Sensipar: none  Discharge labs: Hgb: 8.5 K+: 4.1 Ca: 8.4 Phos: 3.5 Alb: 2.2  Please draw routine labs. Additional labs needed: none Additional notes/follow-up: none

## 2023-10-30 NOTE — Progress Notes (Signed)
 Advised by CSW that pt will d/c to snf today. Contacted [facility] to be advised of pt's d/c today and that pt should start tomorrow. Contacted renal PA to request that orders be sent to clinic. Pt's HD arrangements placed on pt's AVS and discussed with daughter on Monday.   Randine Mungo Dialysis Navigator [phone]

## 2023-10-30 NOTE — Discharge Summary (Addendum)
 PATIENT DETAILS Name: Jill Jefferson Age: 63 y.o. Sex: female Date of Birth: 12-Jul-1960 MRN: [id number]. Admitting Physician: Editha Ram, MD ERE:Jcalzmz, Aliene, MD  Admit Date: 10/21/2023 Discharge date: 10/30/2023  Recommendations for Outpatient Follow-up:  Follow up with PCP in 1-2 weeks Please obtain CMP/CBC in one week Please ensure follow-up with hemodialysis clinic-new onset HD. 16 mm right adrenal nodule-incidental finding-defer outpatient workup to PCP  Admitted From:  Home  Disposition: Skilled nursing facility   Discharge Condition: good  CODE STATUS:   Code Status: Full Code   Diet recommendation:  Diet Order             Diet - low sodium heart healthy           Diet Carb Modified           Diet renal with fluid restriction Fluid restriction: 1200 mL Fluid; Room service appropriate? Yes; Fluid consistency: Thin  Diet effective now                    Brief Summary: Patient is a 63 y.o.  female with history of CKD 5, HFrEF, DM-2, seizure disorder-who presented to the hospital with progressive weakness, nausea, vomiting-was felt to have uremia secondary to progression to ESRD.   Significant events: 10/13>> admit to [facility] 10/15>> started on HD   Significant studies: 10/13>> CXR: No PNA 10/13>> MRI brain: No acute CVA 10/13>> CT abdomen/pelvis: No acute findings.   Significant microbiology data: None   Procedures: 10/15>> TDC by IR 10/20>> brachiobasilic AVF by vascular surgery   Consults: Nephrology Vascular surgery Palliative care Interventional radiology  Brief Hospital Course: Uremia secondary to underlying CKD 5 with progression to ESRD Uremic symptoms have resolved after initiation of HD Nephrology followed close HD on TTS schedule (has outpatient HD slot already arranged at TTS schedule)   Normocytic anemia Secondary to ESRD/CKD Hb stable-but has required 2 units of PRBC this admission (last transfused 10/18)  Aranesp /IV iron  therapies defer to nephrology service Follow CBC periodically.   Hyponatremia Mild Asymptomatic Secondary to impaired free water excretion in the setting of ESRD Should improve with further hemodialysis sessions.   Minimally elevated troponin Suspect false positive elevation in the setting of ESRD No anginal symptoms Doubt any further workup required   Chronic HFrEF (EF 40-45% by echo on 07/13/2023) Currently euvolemic Volume removal with HD   HTN BP stable Continue Coreg    DM-2 CBGs relatively stable Lantus  4 units daily+ SSI Follow/optimize   Seizure disorder Keppra    Prior history of craniotomy (1990s)   16 mm right adrenal nodule Incidental finding on CT abdomen Stable for outpatient follow-up/workup by PCP   Nutrition Status: Nutrition Problem: Moderate Malnutrition Etiology: chronic illness Signs/Symptoms: percent weight loss, moderate muscle depletion Percent weight loss: 16 % Interventions: Ensure Enlive (each supplement provides 350kcal and 20 grams of protein), MVI, Education, Liberalize Diet    Discharge Diagnoses:  Principal Problem:   CKD (chronic kidney disease) stage 5, GFR less than 15 ml/min (HCC) Active Problems:   Hyponatremia   Hypokalemia   Anemia   Dizziness   Malnutrition of moderate degree   Discharge Instructions:  Activity:  As tolerated with Full fall precautions use walker/cane & assistance as needed  Discharge Instructions     Diet - low sodium heart healthy   Complete by: As directed    Diet Carb Modified   Complete by: As directed    Discharge instructions   Complete by: As directed  Follow with Primary MD  Shelda Atlas, MD in 1-2 weeks  Check CBGs before meals and at bedtime.  Please get a complete blood count and chemistry panel checked by your Primary MD at your next visit, and again as instructed by your Primary MD.  Get Medicines reviewed and adjusted: Please take all your medications  with you for your next visit with your Primary MD  Laboratory/radiological data: Please request your Primary MD to go over all hospital tests and procedure/radiological results at the follow up, please ask your Primary MD to get all Hospital records sent to his/her office.  In some cases, they will be blood work, cultures and biopsy results pending at the time of your discharge. Please request that your primary care M.D. follows up on these results.  Also Note the following: If you experience worsening of your admission symptoms, develop shortness of breath, life threatening emergency, suicidal or homicidal thoughts you must seek medical attention immediately by calling 911 or calling your MD immediately  if symptoms less severe.  You must read complete instructions/literature along with all the possible adverse reactions/side effects for all the Medicines you take and that have been prescribed to you. Take any new Medicines after you have completely understood and accpet all the possible adverse reactions/side effects.   Do not drive when taking Pain medications or sleeping medications (Benzodaizepines)  Do not take more than prescribed Pain, Sleep and Anxiety Medications. It is not advisable to combine anxiety,sleep and pain medications without talking with your primary care practitioner  Special Instructions: If you have smoked or chewed Tobacco  in the last 2 yrs please stop smoking, stop any regular Alcohol  and or any Recreational drug use.  Wear Seat belts while driving.  Please note: You were cared for by a hospitalist during your hospital stay. Once you are discharged, your primary care physician will handle any further medical issues. Please note that NO REFILLS for any discharge medications will be authorized once you are discharged, as it is imperative that you return to your primary care physician (or establish a relationship with a primary care physician if you do not have one) for  your post hospital discharge needs so that they can reassess your need for medications and monitor your lab values.   Increase activity slowly   Complete by: As directed    No wound care   Complete by: As directed       Allergies as of 10/30/2023   No Known Allergies      Medication List     STOP taking these medications    metolazone  5 MG tablet Commonly known as: ZAROXOLYN    torsemide  100 MG tablet Commonly known as: DEMADEX    traMADol 50 MG tablet Commonly known as: ULTRAM       TAKE these medications    Acetaminophen  Extra Strength 500 MG Tabs Take 1-2 tablets by mouth every 6 (six) hours as needed.   BD Pen Needle Nano U/F 32G X 4 MM Misc Generic drug: Insulin  Pen Needle Use for insulin  injections once daily. (for insulin  injections qdDx: E11.65 on insulin )   carvedilol  6.25 MG tablet Commonly known as: COREG  Take 1 tablet (6.25 mg total) by mouth 2 (two) times daily with a meal.   feeding supplement Liqd Take 237 mLs by mouth 3 (three) times daily between meals.   levETIRAcetam  500 MG tablet Commonly known as: KEPPRA  Take 2 tablets (1,000 mg total) by mouth daily. What changed:  how much  to take when to take this   multivitamin Tabs tablet Take 1 tablet by mouth at bedtime.   NovoLOG  FlexPen 100 UNIT/ML FlexPen Generic drug: insulin  aspart 0-9 Units, Subcutaneous, 3 times daily with meals CBG < 70: Implement Hypoglycemia measures CBG 70 - 120: 0 units CBG 121 - 150: 1 unit CBG 151 - 200: 2 units CBG 201 - 250: 3 units CBG 251 - 300: 5 units CBG 301 - 350: 7 units CBG 351 - 400: 9 units CBG > 400: call MD   ondansetron 4 MG disintegrating tablet Commonly known as: ZOFRAN-ODT Take 4 mg by mouth every 8 (eight) hours as needed.   Tresiba  FlexTouch 100 UNIT/ML FlexTouch Pen Generic drug: insulin  degludec Inject 4 Units into the skin daily. What changed:  how much to take when to take this        Contact information for follow-up providers      [facility] Transportation Follow up.   Why: You will need to call 2-3 days in advance to schedule transportation. You should be able to arrange a scheduled pick up for HD days Contact information: [employer] [phone]        [facility] [place] Follow up in 6 week(s).   Specialty: Vascular Surgery Why: Office will call to arrange your appt(s) Contact information: [address] [place]  [address] [phone]        [facility] Go on 10/31/2023.   Why: Schedule is Tuesday, Thursday, Saturday with 10:15 am start time.  On first day, please arrive at 9:30 am to complete paperwork prior to treatment. Contact information: [address] [place] [address] [phone]         Shelda Atlas, MD. Schedule an appointment as soon as possible for a visit in 1 week(s).   Specialty: Internal Medicine Contact information: [address] [place] [address] [phone]              Contact information for after-discharge care     Destination     [facility] .   Service: Skilled Nursing Contact information: [address] [place]  [phone]                    No Known Allergies   Other Procedures/Studies: VAS US  UPPER EXT VEIN MAPPING (PRE-OP AVF) Result Date: 10/28/2023 UPPER EXTREMITY VEIN MAPPING Patient Name:  NORELY SCHLICK  Date of Exam:   10/27/2023 Medical Rec #: [id number]        Accession #:    [id number] Date of Birth: July 08, 1960         Patient Gender: F Patient Age:   88 years Exam Location:  [facility] Procedure:      VAS US  UPPER EXT VEIN MAPPING (PRE-OP AVF) Referring Phys: [facility] --------------------------------------------------------------------------------  History: PMH of DMII, seizures, HTN, and CKD.  Performing Technologist: Ricka Sturdivant-Jones RDMS, RVT   Examination Guidelines: A complete evaluation includes B-mode imaging, spectral Doppler, color Doppler, and power Doppler as needed of all accessible portions of each vessel. Bilateral testing is considered an integral part of a complete examination. Limited examinations for reoccurring indications may be performed as noted. +-----------------+-------------+----------+---------+ Right Cephalic   Diameter (cm)Depth (cm)Findings  +-----------------+-------------+----------+---------+ Shoulder             0.26        0.78             +-----------------+-------------+----------+---------+  Prox upper arm       0.30        0.81             +-----------------+-------------+----------+---------+ Mid upper arm        0.19        0.46   branching +-----------------+-------------+----------+---------+ Dist upper arm       0.19        0.33             +-----------------+-------------+----------+---------+ Antecubital fossa    0.29        0.23             +-----------------+-------------+----------+---------+ Prox forearm         0.32        0.22             +-----------------+-------------+----------+---------+ Mid forearm          0.35        0.32             +-----------------+-------------+----------+---------+ Dist forearm         0.32        0.26             +-----------------+-------------+----------+---------+ Wrist                0.21        0.26             +-----------------+-------------+----------+---------+ +-----------------+-------------+----------+---------+ Right Basilic    Diameter (cm)Depth (cm)Findings  +-----------------+-------------+----------+---------+ Shoulder             0.67        0.73             +-----------------+-------------+----------+---------+ Prox upper arm       0.45        1.01   branching +-----------------+-------------+----------+---------+ Mid upper arm        0.50        1.11              +-----------------+-------------+----------+---------+ Dist upper arm       0.46        0.75             +-----------------+-------------+----------+---------+ Antecubital fossa    0.49        0.62   branching +-----------------+-------------+----------+---------+ Prox forearm         0.29        0.20             +-----------------+-------------+----------+---------+ Mid forearm          0.34        0.18             +-----------------+-------------+----------+---------+ Distal forearm       0.20        0.23             +-----------------+-------------+----------+---------+ Wrist                0.28        1.18             +-----------------+-------------+----------+---------+ +-----------------+-------------+----------+--------+ Left Cephalic    Diameter (cm)Depth (cm)Findings +-----------------+-------------+----------+--------+ Prox upper arm       2.18        0.67            +-----------------+-------------+----------+--------+ Mid upper arm        0.16        0.62            +-----------------+-------------+----------+--------+  Dist upper arm       0.25        0.39            +-----------------+-------------+----------+--------+ Antecubital fossa    0.30        0.55            +-----------------+-------------+----------+--------+ Prox forearm         0.34        0.43            +-----------------+-------------+----------+--------+ Mid forearm          0.30        0.33   thrombus +-----------------+-------------+----------+--------+ Dist forearm         0.32        0.26            +-----------------+-------------+----------+--------+ Wrist                0.21        0.21            +-----------------+-------------+----------+--------+ +-----------------+-------------+----------+--------------+ Left Basilic     Diameter (cm)Depth (cm)   Findings    +-----------------+-------------+----------+--------------+ Shoulder                                 not visualized +-----------------+-------------+----------+--------------+ Prox upper arm       0.71        0.91                  +-----------------+-------------+----------+--------------+ Mid upper arm        0.53        0.89                  +-----------------+-------------+----------+--------------+ Dist upper arm       0.40        1.01                  +-----------------+-------------+----------+--------------+ Antecubital fossa    0.38        0.79     branching    +-----------------+-------------+----------+--------------+ Prox forearm         0.35        0.16                  +-----------------+-------------+----------+--------------+ Mid forearm          0.28        0.16                  +-----------------+-------------+----------+--------------+ Distal forearm       0.25        0.17                  +-----------------+-------------+----------+--------------+ Wrist                0.21                              +-----------------+-------------+----------+--------------+ *See table(s) above for measurements and observations.  Diagnosing physician: Lonni Gaskins MD Electronically signed by Lonni Gaskins MD on 10/28/2023 at 1:16:05 PM.    Final    IR TUNNELED CENTRAL VENOUS CATH [facility] W IMG Result Date: 10/23/2023 INDICATION: ESRD requiring HD initiation. EXAM: TUNNELED CENTRAL VENOUS HEMODIALYSIS CATHETER PLACEMENT WITH ULTRASOUND AND FLUOROSCOPIC GUIDANCE MEDICATIONS: Ancef 2 gm IV. The antibiotic was given in an appropriate time interval prior to skin puncture. ANESTHESIA/SEDATION: Moderate (  conscious) sedation was employed during this procedure. A total of Versed  1 mg and Fentanyl 50 mcg was administered intravenously. Moderate Sedation Time: 15 minutes. The patient's level of consciousness and vital signs were monitored continuously by radiology nursing throughout the procedure under my direct supervision. FLUOROSCOPY: Radiation Exposure  Index and estimated peak skin dose (PSD); Reference air kerma (RAK), 0.1 mGy. COMPLICATIONS: None immediate. PROCEDURE: Informed written consent was obtained from the patient after a discussion of the risks, benefits, and alternatives to treatment. Questions regarding the procedure were encouraged and answered. The RIGHT neck and chest were prepped with chlorhexidine  in a sterile fashion, and a sterile drape was applied covering the operative field. Maximum barrier sterile technique with sterile gowns and gloves were used for the procedure. A timeout was performed prior to the initiation of the procedure. After creating a small venotomy incision, a micropuncture kit was utilized to access the internal jugular vein. Real-time ultrasound guidance was utilized for vascular access including the acquisition of a permanent ultrasound image documenting patency of the accessed vessel. The microwire was utilized to measure appropriate catheter length. A stiff Glidewire was advanced to the level of the IVC and the micropuncture sheath was exchanged for a peel-away sheath. A palindrome tunneled hemodialysis catheter measuring 19 cm from tip to cuff was tunneled in a retrograde fashion from the anterior chest wall to the venotomy incision. The catheter was then placed through the peel-away sheath with tips ultimately positioned within the superior aspect of the right atrium. Final catheter positioning was confirmed and documented with a spot radiographic image. The catheter aspirates and flushes normally. The catheter was flushed with appropriate volume heparin  dwells. The catheter exit site was secured with a 2-0 Ethilon retention suture. The venotomy incision was closed with Dermabond. Dressings were applied. The patient tolerated the procedure well without immediate post procedural complication. IMPRESSION: Successful placement of 19 cm tip to cuff tunneled hemodialysis catheter via the RIGHT internal jugular vein. The tip  of the catheter is positioned within the proximal RIGHT atrium. The catheter is ready for immediate use. Thom Hall, MD [facility] Electronically Signed   By: Thom Hall M.D.   On: 10/23/2023 14:05   CT ABDOMEN PELVIS WO CONTRAST Result Date: 10/21/2023 EXAM: CT ABDOMEN AND PELVIS WITHOUT CONTRAST 10/21/2023 07:24:58 PM TECHNIQUE: CT of the abdomen and pelvis was performed without the administration of intravenous contrast. Multiplanar reformatted images are provided for review. Automated exposure control, iterative reconstruction, and/or weight-based adjustment of the mA/kV was utilized to reduce the radiation dose to as low as reasonably achievable. COMPARISON: CT abdomen and pelvis 07/11/2023. CLINICAL HISTORY: Abdominal pain, acute, nonlocalized. Patient has had on and off dizziness episodes for over a month. The worst episode is today. Feels weak. Has been vomiting with diarrhea. Abdominal pain, acute, nonlocalized. Patient has had on and off dizziness episodes for over a month. The worst episode is today. Feels weak. Has been vomiting with diarrhea. FINDINGS: LOWER CHEST: Scattered opacities in the right lung base. No pleural effusion. LIVER: The liver is unremarkable. GALLBLADDER AND BILE DUCTS: Gallbladder is unremarkable. No biliary ductal dilatation. SPLEEN: No acute abnormality. PANCREAS: No acute abnormality. ADRENAL GLANDS: Indeterminate right adrenal nodule measuring 16 mm. Left adrenal gland within normal limits. KIDNEYS, URETERS AND BLADDER: Mild right renal atrophy, unchanged. Bilateral renal cysts appear similar to prior. There is a punctate nonobstructing left renal calculus. No hydronephrosis. No perinephric or periureteral stranding. Urinary bladder is unremarkable. Per consensus,  no follow-up is needed for simple Bosniak type 1 and 2 renal cysts, unless the patient has a malignancy history or risk factors. GI AND BOWEL:  Stomach demonstrates no acute abnormality. The appendix appears within normal limits. Appendicoliths are present. There is no bowel obstruction. PERITONEUM AND RETROPERITONEUM: No ascites. No free air. VASCULATURE: Atherosclerotic calcifications of the aorta and branch vessels. Peripheral vascular calcifications are present. LYMPH NODES: No lymphadenopathy. REPRODUCTIVE ORGANS: No acute abnormality. BONES AND SOFT TISSUES: No acute osseous abnormality. Wide mouth umbilical hernia present, small in size. IMPRESSION: 1. No acute findings in the abdomen or pelvis 2. Indeterminate 16 mm right adrenal nodule; recommend nonemergent adrenal protocol CT or MRI for characterization 3. Punctate nonobstructing left renal calculus without hydronephrosis 4. Right basilar airspace opacities fracture / inflammatory. Electronically signed by: Greig Pique MD 10/21/2023 07:47 PM EDT RP Workstation: [id number]   MR BRAIN WO CONTRAST Result Date: 10/21/2023 EXAM: MR Brain without Intravenous Contrast. CLINICAL HISTORY: Neuro deficit, acute, stroke suspected. TECHNIQUE: Magnetic resonance images of the brain without intravenous contrast in multiple planes. CONTRAST: Without. COMPARISON: CT head 01/01/2023 and MRI head 09/02/2020. FINDINGS: BRAIN: No restricted diffusion to indicate acute infarction. No intracranial mass or hemorrhage. No midline shift or extra-axial fluid collection. No cerebellar tonsillar ectopia. The central arterial and venous flow voids are patent. Postsurgical changes of left occipital craniotomy with similar appearance of underlying encephalomalacia. There is asymmetric volume loss of the left hippocampus. Susceptibility in the left occipital lobe which may reflect postsurgical changes. VENTRICLES: No hydrocephalus. ORBITS: Right lens replacement. SINUSES AND MASTOIDS: The sinuses and mastoid air cells are clear. BONES: Postsurgical changes of left occipital craniotomy. IMPRESSION: 1. No acute findings. 2.  Postsurgical changes of left occipital craniotomy with underlying encephalomalacia. 3. Asymmetric volume loss of the left hippocampus. Electronically signed by: Donnice Mania MD 10/21/2023 06:48 PM EDT RP Workstation: [id number]   DG Chest Port 1 View Result Date: 10/21/2023 CLINICAL DATA:  Intermittent dizziness.  Vomiting and diarrhea. EXAM: PORTABLE CHEST 1 VIEW COMPARISON:  07/24/2023. FINDINGS: Trachea is midline. Heart is at the upper limits of normal in size to mildly enlarged. Lungs are clear. No pleural fluid. IMPRESSION: No acute findings. Electronically Signed   By: Newell Eke M.D.   On: 10/21/2023 17:07     TODAY-DAY OF DISCHARGE:  Subjective:   Jill Jefferson today has no headache,no chest abdominal pain,no new weakness tingling or numbness, feels much better wants to go home today.   Objective:   Blood pressure 136/61, pulse 77, temperature 99.1 F (37.3 C), temperature source Oral, resp. rate 13, height 5' (1.524 m), weight 52.5 kg, SpO2 99%.  Intake/Output Summary (Last 24 hours) at 10/30/2023 1023 Last data filed at 10/29/2023 1116 Gross per 24 hour  Intake --  Output 1000 ml  Net -1000 ml   Filed Weights   10/28/23 1804 10/29/23 0745 10/29/23 1116  Weight: 54 kg 53.7 kg 52.5 kg    Exam: Awake Alert, Oriented *3, No new F.N deficits, Normal affect [place].AT,PERRAL Supple Neck,No JVD, No cervical lymphadenopathy appriciated.  Symmetrical Chest wall movement, Good air movement bilaterally, CTAB RRR,No Gallops,Rubs or new Murmurs, No Parasternal Heave +ve B.Sounds, Abd Soft, Non tender, No organomegaly appriciated, No rebound -guarding or rigidity. No Cyanosis, Clubbing or edema, No new Rash or bruise   PERTINENT RADIOLOGIC STUDIES: No results found.   PERTINENT LAB RESULTS: CBC: Recent Labs    10/28/23 0435 10/29/23 0815  WBC 7.7 6.0  HGB 9.6* 8.5*  HCT 29.7*  26.5*  PLT 233 224   CMET CMP     Component Value Date/Time   NA 131 (L) 10/30/2023  0519   NA 139 06/23/2019 1208   K 4.1 10/30/2023 0519   CL 93 (L) 10/30/2023 0519   CO2 28 10/30/2023 0519   GLUCOSE 143 (H) 10/30/2023 0519   BUN 32 (H) 10/30/2023 0519   BUN 27 (H) 06/23/2019 1208   CREATININE 4.13 (H) 10/30/2023 0519   CREATININE 3.48 (H) 06/08/2021 0000   CALCIUM  8.4 (L) 10/30/2023 0519   PROT 6.4 (L) 10/22/2023 2327   PROT 6.3 06/23/2019 1208   ALBUMIN 2.2 (L) 10/30/2023 0519   ALBUMIN 3.9 06/23/2019 1208   AST 18 10/22/2023 2327   ALT 21 10/22/2023 2327   ALKPHOS 69 10/22/2023 2327   BILITOT 1.0 10/22/2023 2327   BILITOT 0.4 06/23/2019 1208   GFR 6.05 (LL) 04/02/2023 1032   EGFR 15 (L) 06/08/2021 0000   GFRNONAA 12 (L) 10/30/2023 0519    GFR Estimated Creatinine Clearance: 10.3 mL/min (A) (by C-G formula based on SCr of 4.13 mg/dL (H)). No results for input(s): LIPASE, AMYLASE in the last 72 hours. No results for input(s): CKTOTAL, CKMB, CKMBINDEX, TROPONINI in the last 72 hours. Invalid input(s): POCBNP No results for input(s): DDIMER in the last 72 hours. No results for input(s): HGBA1C in the last 72 hours. No results for input(s): CHOL, HDL, LDLCALC, TRIG, CHOLHDL, LDLDIRECT in the last 72 hours. No results for input(s): TSH, T4TOTAL, T3FREE, THYROIDAB in the last 72 hours.  Invalid input(s): FREET3 Recent Labs    10/29/23 1615  FERRITIN 479*  TIBC 214*  IRON  55   Coags: No results for input(s): INR in the last 72 hours.  Invalid input(s): PT Microbiology: No results found for this or any previous visit (from the past 240 hours).  FURTHER DISCHARGE INSTRUCTIONS:  Get Medicines reviewed and adjusted: Please take all your medications with you for your next visit with your Primary MD  Laboratory/radiological data: Please request your Primary MD to go over all hospital tests and procedure/radiological results at the follow up, please ask your Primary MD to get all Hospital records sent to  his/her office.  In some cases, they will be blood work, cultures and biopsy results pending at the time of your discharge. Please request that your primary care M.D. goes through all the records of your hospital data and follows up on these results.  Also Note the following: If you experience worsening of your admission symptoms, develop shortness of breath, life threatening emergency, suicidal or homicidal thoughts you must seek medical attention immediately by calling 911 or calling your MD immediately  if symptoms less severe.  You must read complete instructions/literature along with all the possible adverse reactions/side effects for all the Medicines you take and that have been prescribed to you. Take any new Medicines after you have completely understood and accpet all the possible adverse reactions/side effects.   Do not drive when taking Pain medications or sleeping medications (Benzodaizepines)  Do not take more than prescribed Pain, Sleep and Anxiety Medications. It is not advisable to combine anxiety,sleep and pain medications without talking with your primary care practitioner  Special Instructions: If you have smoked or chewed Tobacco  in the last 2 yrs please stop smoking, stop any regular Alcohol  and or any Recreational drug use.  Wear Seat belts while driving.  Please note: You were cared for by a hospitalist during your hospital stay. Once you are  discharged, your primary care physician will handle any further medical issues. Please note that NO REFILLS for any discharge medications will be authorized once you are discharged, as it is imperative that you return to your primary care physician (or establish a relationship with a primary care physician if you do not have one) for your post hospital discharge needs so that they can reassess your need for medications and monitor your lab values.  Total Time spent coordinating discharge including counseling, education and face to  face time equals greater than 30 minutes.  SignedBETHA Donalda Applebaum 10/30/2023 10:23 AM

## 2023-10-30 NOTE — TOC Progression Note (Addendum)
 Transition of Care [facility]) - Progression Note    Patient Details  Name: Jill Jefferson MRN: [id number] Date of Birth: [birth year]  Transition of Care [facility]) CM/SW Contact  Inocente GORMAN Kindle, LCSW Phone Number: 10/30/2023, 9:23 AM  Clinical Narrative:    9:24 AM-Per Leonidas, awaiting insurance approval.   10:08 AM-[facility] has received insurance approval. CSW updated renal navigator and MD.   11:52 AM-CSW met with patient who reported agreement with discharge plan via PTAR. She stated her sister was here earlier and her sister let her daughters know. CSW spoke with daughter, Byron, who confirmed agreement with plan.    Expected Discharge Plan: Skilled Nursing Facility Barriers to Discharge: Continued Medical Work up, English as a second language teacher, SNF Pending bed offer (HD center coordination with SNF)               Expected Discharge Plan and Services In-house Referral: Clinical Social Work   Post Acute Care Choice: Skilled Nursing Facility Living arrangements for the past 2 months: Apartment                                       Social Drivers of Health (SDOH) Interventions SDOH Screenings   Food Insecurity: No Food Insecurity (10/22/2023)  Housing: Unknown (10/22/2023)  Transportation Needs: Patient Declined (10/22/2023)  Utilities: Patient Declined (10/22/2023)  Depression (PHQ2-9): Low Risk  (02/25/2019)  Tobacco Use: Low Risk  (10/28/2023)    Readmission Risk Interventions     No data to display

## 2023-10-30 NOTE — Progress Notes (Signed)
 PROGRESS NOTE        PATIENT DETAILS Name: Jill Jefferson Age: 63 y.o. Sex: female Date of Birth: [birth year] Admit Date: 10/21/2023 Admitting Physician Editha Ram, MD ERE:Jcalzmz, Aliene, MD  Brief Summary: Patient is a 63 y.o.  female with history of CKD 5, HFrEF, DM-2, seizure disorder-who presented to the hospital with progressive weakness, nausea, vomiting-was felt to have uremia secondary to progression to ESRD.  Significant events: 10/13>> admit to [facility] 10/15>> started on HD  Significant studies: 10/13>> CXR: No PNA 10/13>> MRI brain: No acute CVA 10/13>> CT abdomen/pelvis: No acute findings.  Significant microbiology data: None  Procedures: 10/15>> TDC by IR 10/20>> brachiobasilic AVF by vascular surgery  Consults: Nephrology Vascular surgery Palliative care Interventional radiology  Subjective: Lying comfortably in bed-denies any chest pain or shortness of breath.  Objective: Vitals: Blood pressure 136/61, pulse 77, temperature 99.1 F (37.3 C), temperature source Oral, resp. rate 13, height 5' (1.524 m), weight 52.5 kg, SpO2 99%.   Exam: Gen Exam:Alert awake-not in any distress HEENT:atraumatic, normocephalic Chest: B/L clear to auscultation anteriorly CVS:S1S2 regular Abdomen:soft non tender, non distended Extremities:no edema Neurology: Non focal Skin: no rash  Pertinent Labs/Radiology:    Latest Ref Rng & Units 10/29/2023    8:15 AM 10/28/2023    4:35 AM 10/27/2023    4:33 AM  CBC  WBC 4.0 - 10.5 K/uL 6.0  7.7  9.1   Hemoglobin 12.0 - 15.0 g/dL 8.5  9.6  9.2   Hematocrit 36.0 - 46.0 % 26.5  29.7  28.6   Platelets 150 - 400 K/uL 224  233  196     Lab Results  Component Value Date   NA 131 (L) 10/30/2023   K 4.1 10/30/2023   CL 93 (L) 10/30/2023   CO2 28 10/30/2023      Assessment/Plan: Uremia secondary to underlying CKD 5 with progression to ESRD Uremic symptoms have resolved after initiation of  HD Nephrology following-HD on TTS schedule (has outpatient HD slot already arranged at TTS schedule)  Normocytic anemia Secondary to ESRD/CKD Hb stable-but has required 2 units of PRBC this admission (last transfused 10/18) Aranesp /IV iron  therapies defer to nephrology service Follow CBC periodically.  Hyponatremia Mild Asymptomatic Secondary to impaired free water excretion in the setting of ESRD Should improve with further hemodialysis sessions.  Minimally elevated troponin Suspect false positive elevation in the setting of ESRD No anginal symptoms Doubt any further workup required  Chronic HFrEF (EF 40-45% by echo on 07/13/2023) Currently euvolemic Volume removal with HD  HTN BP stable Continue Coreg   DM-2 CBGs relatively stable Lantus  4 units daily+ SSI Follow/optimize  Seizure disorder Keppra   Prior history of craniotomy (1990s)  16 mm right adrenal nodule Incidental finding on CT abdomen Stable for outpatient follow-up/workup by PCP  Nutrition Status: Nutrition Problem: Moderate Malnutrition Etiology: chronic illness Signs/Symptoms: percent weight loss, moderate muscle depletion Percent weight loss: 16 % Interventions: Ensure Enlive (each supplement provides 350kcal and 20 grams of protein), MVI, Education, Liberalize Diet  Code status:   Code Status: Full Code   DVT Prophylaxis: heparin  injection 5,000 Units Start: 10/29/23 0600 SCDs Start: 10/21/23 2254   Family Communication: None at bedside   Disposition Plan: Status is: Inpatient Remains inpatient appropriate because: Severity of illness   Planned Discharge Destination:Skilled nursing facility   Diet: Diet Order  Diet renal with fluid restriction Fluid restriction: 1200 mL Fluid; Room service appropriate? Yes; Fluid consistency: Thin  Diet effective now                     Antimicrobial agents: Anti-infectives (From admission, onward)    Start     Dose/Rate Route  Frequency Ordered Stop   10/23/23 1200  ceFAZolin (ANCEF) IVPB 2g/100 mL premix        2 g 200 mL/hr over 30 Minutes Intravenous To Radiology 10/23/23 1000 10/24/23 1200   10/23/23 1040  ceFAZolin (ANCEF) IVPB 2g/100 mL premix        over 30 Minutes Intravenous Continuous PRN 10/23/23 1043 10/23/23 1110        MEDICATIONS: Scheduled Meds:  carvedilol   6.25 mg Oral BID WC   Chlorhexidine  Gluconate Cloth  6 each Topical Q0600   [START ON 11/01/2023] darbepoetin (ARANESP ) injection - DIALYSIS  100 mcg Subcutaneous Q Fri-1800   feeding supplement  237 mL Oral TID BM   heparin  injection (subcutaneous)  5,000 Units Subcutaneous Q8H   insulin  aspart  0-5 Units Subcutaneous QHS   insulin  aspart  0-9 Units Subcutaneous TID WC   insulin  glargine-yfgn  4 Units Subcutaneous Daily   levETIRAcetam   1,000 mg Oral Q24H   multivitamin  1 tablet Oral QHS   Continuous Infusions: PRN Meds:.acetaminophen , docusate sodium, HYDROcodone-acetaminophen , ondansetron (ZOFRAN) IV, mouth rinse, oxyCODONE, tiZANidine   I have personally reviewed following labs and imaging studies  LABORATORY DATA: CBC: Recent Labs  Lab 10/25/23 0414 10/26/23 0402 10/27/23 0433 10/28/23 0435 10/29/23 0815  WBC 8.4 8.7 9.1 7.7 6.0  HGB 8.0* 7.7* 9.2* 9.6* 8.5*  HCT 24.5* 24.3* 28.6* 29.7* 26.5*  MCV 80.9 82.7 82.7 83.2 84.4  PLT 197 195 196 233 224    Basic Metabolic Panel: Recent Labs  Lab 10/26/23 0402 10/27/23 0433 10/28/23 0435 10/29/23 0335 10/30/23 0519  NA 131* 130* 129* 132* 131*  K 3.8 3.8 4.7 4.0 4.1  CL 96* 92* 91* 96* 93*  CO2 26 25 24 28 28   GLUCOSE 145* 85 156* 223* 143*  BUN 36* 51* 72* 27* 32*  CREATININE 4.52* 6.02* 7.87* 3.86* 4.13*  CALCIUM  8.2* 8.3* 8.5* 8.0* 8.4*  PHOS 3.5 4.1 4.1 3.2 3.4    GFR: Estimated Creatinine Clearance: 10.3 mL/min (A) (by C-G formula based on SCr of 4.13 mg/dL (H)).  Liver Function Tests: Recent Labs  Lab 10/26/23 0402 10/27/23 0433 10/28/23 0435  10/29/23 0335 10/30/23 0519  ALBUMIN 2.0* 2.1* 2.2* 1.9* 2.2*   Recent Labs  Lab 10/24/23 0428  LIPASE 94*   No results for input(s): AMMONIA in the last 168 hours.  Coagulation Profile: No results for input(s): INR, PROTIME in the last 168 hours.  Cardiac Enzymes: No results for input(s): CKTOTAL, CKMB, CKMBINDEX, TROPONINI in the last 168 hours.  BNP (last 3 results) Recent Labs    07/11/23 1625  PROBNP 33,608.0*    Lipid Profile: No results for input(s): CHOL, HDL, LDLCALC, TRIG, CHOLHDL, LDLDIRECT in the last 72 hours.  Thyroid  Function Tests: No results for input(s): TSH, T4TOTAL, FREET4, T3FREE, THYROIDAB in the last 72 hours.  Anemia Panel: Recent Labs    10/29/23 1615  FERRITIN 479*  TIBC 214*  IRON  55    Urine analysis:    Component Value Date/Time   COLORURINE STRAW (A) 07/24/2023 1011   APPEARANCEUR CLEAR 07/24/2023 1011   LABSPEC 1.009 07/24/2023 1011   PHURINE 7.0 07/24/2023 1011  GLUCOSEU 150 (A) 07/24/2023 1011   HGBUR NEGATIVE 07/24/2023 1011   BILIRUBINUR NEGATIVE 07/24/2023 1011   BILIRUBINUR neg 06/23/2019 1148   KETONESUR NEGATIVE 07/24/2023 1011   PROTEINUR >=300 (A) 07/24/2023 1011   UROBILINOGEN 0.2 06/23/2019 1148   NITRITE NEGATIVE 07/24/2023 1011   LEUKOCYTESUR NEGATIVE 07/24/2023 1011    Sepsis Labs: Lactic Acid, Venous    Component Value Date/Time   LATICACIDVEN 2.5 (HH) 09/01/2020 1459    MICROBIOLOGY: No results found for this or any previous visit (from the past 240 hours).  RADIOLOGY STUDIES/RESULTS: No results found.   LOS: 9 days   Donalda Applebaum, MD  [facility]    To contact the attending provider between 7A-7P or the covering provider during after hours 7P-7A, please log into the web site [email or web address] and access using universal [place] password for that web site. If you do not have the password, please call the hospital operator.  10/30/2023, 8:30  AM

## 2023-10-30 NOTE — Progress Notes (Signed)
 AVS in packet fo rtransport  and reviewed AVS with patient  all questions answered.

## 2023-10-31 ENCOUNTER — Telehealth: Payer: Self-pay

## 2023-10-31 NOTE — Telephone Encounter (Signed)
 Post Op Care: -SNF Nurse called to clarify post op care instructions.  She stated the pt is telling them she has a soft cast and should not take it off for 4-6 weeks.  She states that it is not just an Ace Bandage. -consulted with PA, confirmed it is only a Ace wrap and confirmed they should take it off.   -advised elevate if swelling occurs and may temporarily use ace wrap, monitor for s/s of infection, RTO 4-6 weeks for ultrasound.  Appt was already scheduled and date/time given to nurse.

## 2023-11-03 ENCOUNTER — Other Ambulatory Visit: Payer: Self-pay

## 2023-11-03 ENCOUNTER — Emergency Department (HOSPITAL_COMMUNITY)

## 2023-11-03 ENCOUNTER — Emergency Department (HOSPITAL_COMMUNITY): Admission: EM | Admit: 2023-11-03 | Discharge: 2023-11-03 | Disposition: A | Discharge status: Home / Self Care

## 2023-11-03 ENCOUNTER — Encounter (HOSPITAL_COMMUNITY): Payer: Self-pay | Admitting: Emergency Medicine

## 2023-11-03 DIAGNOSIS — N186 End stage renal disease: Secondary | ICD-10-CM | POA: Diagnosis not present

## 2023-11-03 DIAGNOSIS — I2510 Atherosclerotic heart disease of native coronary artery without angina pectoris: Secondary | ICD-10-CM | POA: Insufficient documentation

## 2023-11-03 DIAGNOSIS — Z794 Long term (current) use of insulin: Secondary | ICD-10-CM | POA: Insufficient documentation

## 2023-11-03 DIAGNOSIS — E1122 Type 2 diabetes mellitus with diabetic chronic kidney disease: Secondary | ICD-10-CM | POA: Diagnosis not present

## 2023-11-03 DIAGNOSIS — Z992 Dependence on renal dialysis: Secondary | ICD-10-CM | POA: Diagnosis present

## 2023-11-03 LAB — CBC WITH DIFFERENTIAL/PLATELET
Abs Immature Granulocytes: 0.05 K/uL (ref 0.00–0.07)
Basophils Absolute: 0.1 K/uL (ref 0.0–0.1)
Basophils Relative: 1 %
Eosinophils Absolute: 0.1 K/uL (ref 0.0–0.5)
Eosinophils Relative: 2 %
HCT: 28.7 % — ABNORMAL LOW (ref 36.0–46.0)
Hemoglobin: 8.9 g/dL — ABNORMAL LOW (ref 12.0–15.0)
Immature Granulocytes: 1 %
Lymphocytes Relative: 18 %
Lymphs Abs: 1.2 K/uL (ref 0.7–4.0)
MCH: 27.6 pg (ref 26.0–34.0)
MCHC: 31 g/dL (ref 30.0–36.0)
MCV: 88.9 fL (ref 80.0–100.0)
Monocytes Absolute: 0.8 K/uL (ref 0.1–1.0)
Monocytes Relative: 11 %
Neutro Abs: 4.7 K/uL (ref 1.7–7.7)
Neutrophils Relative %: 67 %
Platelets: 224 K/uL (ref 150–400)
RBC: 3.23 MIL/uL — ABNORMAL LOW (ref 3.87–5.11)
RDW: 17.2 % — ABNORMAL HIGH (ref 11.5–15.5)
WBC: 6.9 K/uL (ref 4.0–10.5)
nRBC: 0 % (ref 0.0–0.2)

## 2023-11-03 LAB — URINALYSIS, ROUTINE W REFLEX MICROSCOPIC
Bilirubin Urine: NEGATIVE
Glucose, UA: 50 mg/dL — AB
Hgb urine dipstick: NEGATIVE
Ketones, ur: NEGATIVE mg/dL
Nitrite: NEGATIVE
Protein, ur: >=300 mg/dL — AB
Specific Gravity, Urine: 1.009 (ref 1.005–1.030)
pH: 7 (ref 5.0–8.0)

## 2023-11-03 LAB — BASIC METABOLIC PANEL WITH GFR
Anion gap: 15 (ref 5–15)
BUN: 49 mg/dL — ABNORMAL HIGH (ref 8–23)
CO2: 24 mmol/L (ref 22–32)
Calcium: 8.9 mg/dL (ref 8.9–10.3)
Chloride: 95 mmol/L — ABNORMAL LOW (ref 98–111)
Creatinine, Ser: 7.39 mg/dL — ABNORMAL HIGH (ref 0.44–1.00)
GFR, Estimated: 6 mL/min — ABNORMAL LOW (ref >60)
Glucose, Bld: 164 mg/dL — ABNORMAL HIGH (ref 70–99)
Potassium: 4.4 mmol/L (ref 3.5–5.1)
Sodium: 134 mmol/L — ABNORMAL LOW (ref 135–145)

## 2023-11-03 LAB — MAGNESIUM: Magnesium: 2.1 mg/dL (ref 1.7–2.4)

## 2023-11-03 NOTE — ED Triage Notes (Signed)
 Pt arrives via EMS from [facility] where pt has missed dialysis yesterday and gained 6 lb in 24 hours. Pt reports she was in hospital and had dialysis Mon, Tuesday and then Thursday once she was at facility. Facility did not send her yesterday. Pt denies complaints.

## 2023-11-03 NOTE — ED Provider Notes (Signed)
 [facility] EMERGENCY DEPARTMENT AT [facility] Provider Note   CSN: [id number] Arrival date & time: 11/03/23  1040     Patient presents with: missed dialysis   Jill Jefferson is a 63 y.o. female.   63 year old female with past medical history of coronary artery disease, diabetes, and end-stage renal disease who recently started dialysis presenting to the emergency department today after missing dialysis.  The patient states that she started dialysis in the hospital last week and was discharged to a nursing facility.  She states that she did have dialysis on Thursday but was unaware that she needed to follow-up on Saturday as no one explained this to her.  She missed dialysis yesterday.  She denies any complaints regarding this currently.  Denies any shortness of breath or leg swelling.  Denies any chest pain or palpitations.        Prior to Admission medications   Medication Sig Start Date End Date Taking? Authorizing Provider  Acetaminophen  Extra Strength 500 MG TABS Take 1-2 tablets by mouth every 6 (six) hours as needed. 10/08/23   [provider]  carvedilol  (COREG ) 6.25 MG tablet Take 1 tablet (6.25 mg total) by mouth 2 (two) times daily with a meal. 07/18/23   Rojelio Nest, DO  feeding supplement (ENSURE PLUS HIGH PROTEIN) LIQD Take 237 mLs by mouth 3 (three) times daily between meals. 10/30/23   Ghimire, Donalda HERO, MD  insulin  aspart (NOVOLOG  FLEXPEN) 100 UNIT/ML FlexPen 0-9 Units, Subcutaneous, 3 times daily with meals CBG < 70: Implement Hypoglycemia measures CBG 70 - 120: 0 units CBG 121 - 150: 1 unit CBG 151 - 200: 2 units CBG 201 - 250: 3 units CBG 251 - 300: 5 units CBG 301 - 350: 7 units CBG 351 - 400: 9 units CBG > 400: call MD 10/30/23   Raenelle Donalda HERO, MD  insulin  degludec (TRESIBA  FLEXTOUCH) 100 UNIT/ML FlexTouch Pen Inject 4 Units into the skin daily. 10/30/23   Ghimire, Donalda HERO, MD  Insulin  Pen Needle (BD PEN NEEDLE NANO U/F) 32G X 4 MM MISC  for insulin  injections qdDx: E11.65 on insulin  06/08/21     levETIRAcetam  (KEPPRA ) 500 MG tablet Take 2 tablets (1,000 mg total) by mouth daily. 10/30/23   Ghimire, Donalda HERO, MD  multivitamin (RENA-VIT) TABS tablet Take 1 tablet by mouth at bedtime. 10/30/23   Ghimire, Donalda HERO, MD  ondansetron (ZOFRAN-ODT) 4 MG disintegrating tablet Take 4 mg by mouth every 8 (eight) hours as needed. 10/08/23   [provider]    Allergies: Patient has no known allergies.    Review of Systems  All other systems reviewed and are negative.   Updated Vital Signs BP (!) 156/73   Pulse 84   Temp 98.1 F (36.7 C) (Oral)   Resp 18   Ht 5' (1.524 m)   Wt 52 kg   SpO2 100%   BMI 22.39 kg/m   Physical Exam Vitals and nursing note reviewed.   Gen: NAD Eyes: PERRL, EOMI HEENT: no oropharyngeal swelling Neck: trachea midline Resp: clear to auscultation bilaterally, tunneled catheter noted in right chest Card: RRR, no murmurs, rubs, or gallops Abd: nontender, nondistended Extremities: no calf tenderness, no edema Vascular: 2+ radial pulses bilaterally, 2+ DP pulses bilaterally Skin: no rashes Psyc: acting appropriately   (all labs ordered are listed, but only abnormal results are displayed) Labs Reviewed  CBC WITH DIFFERENTIAL/PLATELET - Abnormal; Notable for the following components:      Result Value  RBC 3.23 (*)    Hemoglobin 8.9 (*)    HCT 28.7 (*)    RDW 17.2 (*)    All other components within normal limits  BASIC METABOLIC PANEL WITH GFR - Abnormal; Notable for the following components:   Sodium 134 (*)    Chloride 95 (*)    Glucose, Bld 164 (*)    BUN 49 (*)    Creatinine, Ser 7.39 (*)    GFR, Estimated 6 (*)    All other components within normal limits  MAGNESIUM   URINALYSIS, ROUTINE W REFLEX MICROSCOPIC    EKG: None  Radiology: DG Chest Portable 1 View Result Date: 11/03/2023 CLINICAL DATA:  Missed hemodialysis.  Weight gain. EXAM: PORTABLE CHEST 1 VIEW  COMPARISON:  Radiograph 10/21/2023 FINDINGS: Right internal jugular dialysis catheter tip at the atrial caval junction. Stable heart size and mediastinal contours. Normal pulmonary vasculature. No focal airspace disease. No pleural effusion or pneumothorax. No acute osseous findings. IMPRESSION: No acute chest findings. Electronically Signed   By: Andrea Gasman M.D.   On: 11/03/2023 12:14     Procedures   Medications Ordered in the ED - No data to display                                  Medical Decision Making 63 year old female with past medical history of end-stage renal disease, coronary artery disease, and hypertension presenting to the emergency department today with concern for missed dialysis.  The patient is asymptomatic here.  Will further evaluate the patient here with labs as well as an x-ray to evaluate for volume overload.  Patient is well-appearing with stable vital signs.  Will discuss her case with nephrology after her workup to see if they would like her dialyzed emergently or as an outpatient depending on workup.  The patient's labs are reassuring.  Chest x-ray does not show any pulmonary edema.  The patient remained stable here.  I did call discuss her case with nephrology.  Recommend that she follow-up for outpatient dialysis either tomorrow or Tuesday.  The patient is discharged with return precautions.  Amount and/or Complexity of Data Reviewed Radiology: ordered.        Final diagnoses:  End stage renal disease on dialysis [facility])    ED Discharge Orders     None          Ula Prentice SAUNDERS, MD 11/03/23 1401

## 2023-11-03 NOTE — ED Notes (Signed)
 [facility] called unable to give pick up time

## 2023-11-03 NOTE — Discharge Instructions (Addendum)
 Your labs and x-ray were reassuring.  I did call and discussed your case with your nephrologist.  They do recommend calling your dialysis center first thing in the morning to see if they would like to try to get you into dialysis tomorrow.  If they cannot it should be okay to wait until Tuesday based on your labs.  Please return to the emergency department for shortness of breath or other concerning symptoms.

## 2023-11-03 NOTE — ED Notes (Signed)
Patient ambulatory to bathroom with walker and standby assist.

## 2023-11-03 NOTE — ED Provider Triage Note (Signed)
 Emergency Medicine Provider Triage Evaluation Note  Jill Jefferson , a 63 y.o. female  was evaluated in triage.  Pt complains of missing dialysis yesterday.  Patient is new to dialysis and had dialysis on Monday Tuesday and Thursday.  Did not go yesterday during her scheduled time due to some confusion in her schedule.  Patient has had a 6 pound weight gain since Thursday.  No chest pain or shortness of breath.  Review of Systems  Positive:  Negative: See above   Physical Exam  BP 133/61 (BP Location: Right Arm)   Pulse 81   Temp 98.1 F (36.7 C) (Oral)   Resp 16   Ht 5' (1.524 m)   Wt 52 kg   SpO2 99%   BMI 22.39 kg/m  Gen:   Awake, no distress   Resp:  Normal effort  MSK:   Moves extremities without difficulty  Other:    Medical Decision Making  Medically screening exam initiated at 10:55 AM.  Appropriate orders placed.  Roxy Broccoli was informed that the remainder of the evaluation will be completed by another provider, this initial triage assessment does not replace that evaluation, and the importance of remaining in the ED until their evaluation is complete.     Theotis Peers [place], [place] 11/03/23 1056

## 2023-11-05 ENCOUNTER — Other Ambulatory Visit: Payer: Self-pay

## 2023-11-05 DIAGNOSIS — N186 End stage renal disease: Secondary | ICD-10-CM

## 2023-12-11 ENCOUNTER — Ambulatory Visit (HOSPITAL_COMMUNITY)
Admission: RE | Admit: 2023-12-11 | Discharge: 2023-12-11 | Disposition: A | Discharge status: Home / Self Care | Source: Ambulatory Visit | Attending: Surgery | Admitting: Surgery

## 2023-12-11 ENCOUNTER — Ambulatory Visit: Admitting: Physician Assistant

## 2023-12-11 VITALS — BP 172/91 | HR 64 | Temp 98.1°F | Ht 60.0 in | Wt 123.1 lb

## 2023-12-11 DIAGNOSIS — N186 End stage renal disease: Secondary | ICD-10-CM | POA: Insufficient documentation

## 2023-12-14 NOTE — Progress Notes (Signed)
 POST OPERATIVE OFFICE NOTE    CC:  F/u for surgery  HPI: Kelita Perrelli is a 63 y.o. female who is here for a postop visit.  She recently underwent left first stage basilic vein transposition on 10/28/2023 by Dr. Gretta.  This was done for permanent dialysis access.  She currently dialyzes through a right IJ TDC.  She returns today for follow-up.  She has no complaints at today's office visit.  She denies any issues with her left arm incisions such as dehiscence, redness, or tenderness.  She denies any symptoms of steal in the left hand such as weakness, numbness, pain, or excessive coldness.   No Known Allergies  Current Outpatient Medications  Medication Sig Dispense Refill   Acetaminophen  Extra Strength 500 MG TABS Take 1-2 tablets by mouth every 6 (six) hours as needed.     carvedilol  (COREG ) 6.25 MG tablet Take 1 tablet (6.25 mg total) by mouth 2 (two) times daily with a meal. 180 tablet 0   feeding supplement (ENSURE PLUS HIGH PROTEIN) LIQD Take 237 mLs by mouth 3 (three) times daily between meals.     insulin  aspart (NOVOLOG  FLEXPEN) 100 UNIT/ML FlexPen 0-9 Units, Subcutaneous, 3 times daily with meals CBG < 70: Implement Hypoglycemia measures CBG 70 - 120: 0 units CBG 121 - 150: 1 unit CBG 151 - 200: 2 units CBG 201 - 250: 3 units CBG 251 - 300: 5 units CBG 301 - 350: 7 units CBG 351 - 400: 9 units CBG > 400: call MD     insulin  degludec (TRESIBA  FLEXTOUCH) 100 UNIT/ML FlexTouch Pen Inject 4 Units into the skin daily.     Insulin  Pen Needle (BD PEN NEEDLE NANO U/F) 32G X 4 MM MISC for insulin  injections qdDx: E11.65 on insulin  100 each 5   levETIRAcetam  (KEPPRA ) 500 MG tablet Take 2 tablets (1,000 mg total) by mouth daily.     multivitamin (RENA-VIT) TABS tablet Take 1 tablet by mouth at bedtime.     ondansetron  (ZOFRAN -ODT) 4 MG disintegrating tablet Take 4 mg by mouth every 8 (eight) hours as needed.     No current facility-administered medications for this visit.     ROS:   See HPI  Physical Exam:  Incision: Left arm incision well-healed without signs of infection Extremities: Palpable left radial pulse.  Left brachiobasilic fistula with great thrill throughout upper arm Neuro: Intact motor and sensation of left hand  Studies: Dialysis duplex (12/11/2023) +--------------------+----------+-----------------+--------+  AVF                PSV (cm/s)Flow Vol (mL/min)Comments  +--------------------+----------+-----------------+--------+  Native artery inflow   224           831                 +--------------------+----------+-----------------+--------+  AVF Anastomosis        551                               +--------------------+----------+-----------------+--------+     +------------+----------+-------------+----------+--------+  OUTFLOW VEINPSV (cm/s)Diameter (cm)Depth (cm)Describe  +------------+----------+-------------+----------+--------+  Prox UA         95        0.62        1.27             +------------+----------+-------------+----------+--------+  Mid UA         162        0.54  0.91             +------------+----------+-------------+----------+--------+  Dist UA        214        0.72        0.66             +------------+----------+-------------+----------+--------+  AC Fossa       620        0.47        0.66             +------------+----------+-------------+----------+--------+      Assessment/Plan:  This is a 63 y.o. female who is here for a post op visit  - The patient recently underwent creation of a left brachiobasilic AV fistula in October for permanent dialysis access.  Her left arm incision is well-healed without signs of infection -Duplex demonstrates a well maturing left brachiobasilic fistula with good size and flow volume of 831 mL/min - She denies any symptoms of steal since surgery such as left hand weakness, numbness, excessive coldness, or pain.  She has a palpable left  radial pulse with intact motor and sensation of the left hand -Her left brachiobasilic fistula has a great thrill throughout the upper arm -I have explained to the patient that the next step would be to schedule her second stage surgery including left brachiobasilic fistula transposition and superficialization. I have outlined the steps of a second stage surgery and risks versus benefits including bleeding, infection, and possibility of future revision. The patient claims that she had no idea that she would need a second surgery and is unwilling to commit to another surgery at this time.  She is aware that she cannot dialyze through her fistula at its current state. - She will continue to dialyze through her right IJ TDC.  She will call our office sometime in the near future when she decides if she wants her second stage surgery or not.   Ahmed Holster, PA-C [facility] [phone]   Clinic MD:  Sheree

## 2023-12-16 ENCOUNTER — Other Ambulatory Visit: Payer: Self-pay

## 2023-12-16 DIAGNOSIS — N186 End stage renal disease: Secondary | ICD-10-CM

## 2023-12-24 ENCOUNTER — Encounter (HOSPITAL_COMMUNITY): Payer: Self-pay | Admitting: Vascular Surgery

## 2023-12-24 ENCOUNTER — Other Ambulatory Visit: Payer: Self-pay

## 2023-12-24 NOTE — Progress Notes (Addendum)
 SDW CALL  Patient was given pre-op  instructions over the phone. The opportunity was given for the patient to ask questions. No further questions asked. Patient verbalized understanding of instructions given.   PCP - Aliene Shelda COME Cardiologist - denies  PPM/ICD - denies Device Orders -  Rep Notified -   Chest x-ray - 1view 11/03/23 EKG - 11/03/23 Stress Test -  ECHO - 07/13/23 Cardiac Cath - denies  Sleep Study - denies CPAP -   Fasting Blood Sugar - 50-100 Checks Blood Sugar 2 times a day at facility  Blood Thinner Instructions:na Aspirin  Instructions:na  ERAS Protcol -NPO PRE-SURGERY Ensure or G2-   COVID TEST- na   Anesthesia review: yes-Hx HTN,seizures,DM2,ESRD,CHF   Patient denies shortness of breath, fever, cough and chest pain over the phone call    Surgical Instructions- faxed to [facility]    Your procedure is scheduled on December 17  Report to [facility] Main Entrance A at 10:50 A.M., then check in with the Admitting office.  Call this number if you have problems the morning of surgery:  [phone]    Remember:  Do not eat or drink anything after midnight the night before your surgery    Take these medicines the morning of surgery with A SIP OF WATER:  carvedilol  (COREG )  levETIRAcetam  (KEPPRA )   As of today, STOP taking any Aspirin  (unless otherwise instructed by your surgeon) Aleve, Naproxen, Ibuprofen, Motrin, Advil, Goody's, BC's, all herbal medications, fish oil, and all vitamins.  WHAT DO I DO ABOUT MY DIABETES MEDICATION?  Take 2 units of Tresiba  insulin  either the night before surgery or the morning of surgery depending on the time you normally take it. This is 1/2 your normal dose.   The morning of surgery you may take your Novolog  insulin  if your blood sugar is greater than 220. Only take 1/2 your normal dose. If your blood sugar is less than 220, do not take any Novolog  insulin .  Check your  blood sugar the morning of your surgery when you wake up and every 2 hours until you get to the Short Stay unit.  If your blood sugar is less than 70 mg/dL, you will need to treat for low blood sugar: Do not take insulin . Treat a low blood sugar (less than 70 mg/dL) with  cup of clear juice (cranberry or apple), 4 glucose tablets, OR glucose gel. Recheck blood sugar in 15 minutes after treatment (to make sure it is greater than 70 mg/dL). If your blood sugar is not greater than 70 mg/dL on recheck, call [phone] for further instructions. Report your blood sugar to the short stay nurse when you get to Short Stay.  If you are admitted to the hospital after surgery: Your blood sugar will be checked by the staff and you will probably be given insulin  after surgery (instead of oral diabetes medicines) to make sure you have good blood sugar levels. The goal for blood sugar control after surgery is 80-180 mg/dL.    is not responsible for any belongings or valuables. .   Do NOT Smoke (Tobacco/Vaping)  24 hours prior to your procedure  If you use a CPAP at night, you may bring your mask for your overnight stay.   Contacts, glasses, hearing aids, dentures or partials may not be worn into surgery, please bring cases for these belongings   Patients discharged the day of surgery will not be allowed to drive home, and someone needs to stay with  them for 24 hours.   SURGICAL WAITING ROOM VISITATION You may have 1 visitor in the pre-op  area at a time determined by the pre-op  nurse. (Visitor may not switch out) Patients having surgery or a procedure in a hospital may have two support people in the waiting room. Children under the age of 50 must have an adult with them who is not the patient. They may stay in the waiting area during the procedure and may switch out with other visitors. If the patient needs to stay at the hospital during part of their recovery, the visitor guidelines for  inpatient rooms apply.  Please refer to the [facility] website for the visitor guidelines for Inpatients (after your surgery is over and you are in a regular room).     Special instructions:    Oral Hygiene is also important to reduce your risk of infection.  Remember - BRUSH YOUR TEETH THE MORNING OF SURGERY WITH YOUR REGULAR TOOTHPASTE   Day of Surgery:  Take a shower the day of or night before with antibacterial soap. Wear Clean/Comfortable clothing the morning of surgery Do not apply any deodorants/lotions.   Do not wear jewelry or makeup Do not wear lotions, powders, perfumes/colognes, or deodorant. Do not shave 48 hours prior to surgery.  Men may shave face and neck. Do not bring valuables to the hospital. Do not wear nail polish, gel polish, artificial nails, or any other type of covering on natural nails (fingers and toes) If you have artificial nails or gel coating that need to be removed by a nail salon, please have this removed prior to surgery. Artificial nails or gel coating may interfere with anesthesia's ability to adequately monitor your vital signs. Remember to brush your teeth WITH YOUR REGULAR TOOTHPASTE.

## 2023-12-24 NOTE — Progress Notes (Incomplete)
 SDW CALL  Patient was given pre-op  instructions over the phone. The opportunity was given for the patient to ask questions. No further questions asked. Patient verbalized understanding of instructions given.   PCP - Aliene Shelda COME Cardiologist -   PPM/ICD -  Device Orders -  Rep Notified -   Chest x-ray - 1view-11/03/23 EKG - 11/03/23 Stress Test -  ECHO - 07/13/23 Cardiac Cath -   Sleep Study -  CPAP -   Fasting Blood Sugar -  Checks Blood Sugar _____ times a day  Blood Thinner Instructions:na Aspirin  Instructions:na  ERAS Protcol -NPO PRE-SURGERY Ensure or G2-   COVID TEST- NA   Anesthesia review: yes-Hx HTN,seizures,DM2,ESRD,CHF   Patient denies shortness of breath, fever, cough and chest pain over the phone call   Special instructions:    Oral Hygiene is also important to reduce your risk of infection.  Remember - BRUSH YOUR TEETH THE MORNING OF SURGERY WITH YOUR REGULAR TOOTHPASTE

## 2023-12-25 ENCOUNTER — Encounter (HOSPITAL_COMMUNITY): Payer: Self-pay | Admitting: Vascular Surgery

## 2023-12-25 ENCOUNTER — Ambulatory Visit (HOSPITAL_COMMUNITY): Admitting: Anesthesiology

## 2023-12-25 ENCOUNTER — Encounter: Admission: RE | Disposition: A | Payer: Self-pay | Attending: Vascular Surgery

## 2023-12-25 ENCOUNTER — Ambulatory Visit (HOSPITAL_COMMUNITY)
Admission: RE | Admit: 2023-12-25 | Discharge: 2023-12-25 | Disposition: A | Attending: Vascular Surgery | Admitting: Vascular Surgery

## 2023-12-25 DIAGNOSIS — R569 Unspecified convulsions: Secondary | ICD-10-CM | POA: Diagnosis not present

## 2023-12-25 DIAGNOSIS — I509 Heart failure, unspecified: Secondary | ICD-10-CM | POA: Insufficient documentation

## 2023-12-25 DIAGNOSIS — I132 Hypertensive heart and chronic kidney disease with heart failure and with stage 5 chronic kidney disease, or end stage renal disease: Secondary | ICD-10-CM

## 2023-12-25 DIAGNOSIS — Z794 Long term (current) use of insulin: Secondary | ICD-10-CM | POA: Insufficient documentation

## 2023-12-25 DIAGNOSIS — I5033 Acute on chronic diastolic (congestive) heart failure: Secondary | ICD-10-CM | POA: Diagnosis not present

## 2023-12-25 DIAGNOSIS — E1122 Type 2 diabetes mellitus with diabetic chronic kidney disease: Secondary | ICD-10-CM | POA: Diagnosis not present

## 2023-12-25 DIAGNOSIS — N186 End stage renal disease: Secondary | ICD-10-CM | POA: Diagnosis not present

## 2023-12-25 DIAGNOSIS — Z992 Dependence on renal dialysis: Secondary | ICD-10-CM | POA: Insufficient documentation

## 2023-12-25 HISTORY — PX: BASCILIC VEIN TRANSPOSITION: SHX5742

## 2023-12-25 LAB — POCT I-STAT, CHEM 8
BUN: 73 mg/dL — ABNORMAL HIGH (ref 8–23)
Calcium, Ion: 1.14 mmol/L — ABNORMAL LOW (ref 1.15–1.40)
Chloride: 97 mmol/L — ABNORMAL LOW (ref 98–111)
Creatinine, Ser: 4.9 mg/dL — ABNORMAL HIGH (ref 0.44–1.00)
Glucose, Bld: 131 mg/dL — ABNORMAL HIGH (ref 70–99)
HCT: 44 % (ref 36.0–46.0)
Hemoglobin: 15 g/dL (ref 12.0–15.0)
Potassium: 5.2 mmol/L — ABNORMAL HIGH (ref 3.5–5.1)
Sodium: 137 mmol/L (ref 135–145)
TCO2: 32 mmol/L (ref 22–32)

## 2023-12-25 LAB — GLUCOSE, CAPILLARY
Glucose-Capillary: 151 mg/dL — ABNORMAL HIGH (ref 70–99)
Glucose-Capillary: 121 mg/dL — ABNORMAL HIGH (ref 70–99)
Glucose-Capillary: 138 mg/dL — ABNORMAL HIGH (ref 70–99)

## 2023-12-25 SURGERY — TRANSPOSITION, VEIN, BASILIC
Anesthesia: Monitor Anesthesia Care | Site: Arm Upper | Laterality: Left

## 2023-12-25 MED ORDER — CHLORHEXIDINE GLUCONATE 4 % EX SOLN: 60.0000 mL | Freq: Once | CUTANEOUS | Status: DC

## 2023-12-25 MED ORDER — ONDANSETRON HCL 4 MG/2ML IJ SOLN
INTRAMUSCULAR | Status: DC | PRN
  Administered 2023-12-25: 16:00:00 4 mg via INTRAVENOUS

## 2023-12-25 MED ORDER — INSULIN ASPART 100 UNIT/ML IJ SOLN: 0.0000 [IU] | INTRAMUSCULAR | Status: DC | PRN

## 2023-12-25 MED ORDER — PROPOFOL 10 MG/ML IV BOLUS
INTRAVENOUS | Status: AC
  Filled 2023-12-25: qty 20

## 2023-12-25 MED ORDER — FENTANYL CITRATE (PF) 250 MCG/5ML IJ SOLN
INTRAMUSCULAR | Status: DC | PRN
  Administered 2023-12-25 (×4): 25 ug via INTRAVENOUS

## 2023-12-25 MED ORDER — MIDAZOLAM HCL 2 MG/2ML IJ SOLN
INTRAMUSCULAR | Status: AC
  Filled 2023-12-25: qty 2

## 2023-12-25 MED ORDER — ROCURONIUM BROMIDE 10 MG/ML (PF) SYRINGE
PREFILLED_SYRINGE | INTRAVENOUS | Status: AC
  Filled 2023-12-25: qty 10

## 2023-12-25 MED ORDER — LIDOCAINE HCL (PF) 1 % IJ SOLN
INTRAMUSCULAR | Status: AC
  Filled 2023-12-25: qty 30

## 2023-12-25 MED ORDER — OXYCODONE HCL 5 MG PO TABS
5.0000 mg | ORAL_TABLET | Freq: Once | ORAL | Status: AC | PRN
  Administered 2023-12-25: 18:00:00 5 mg via ORAL

## 2023-12-25 MED ORDER — ORAL CARE MOUTH RINSE: 15.0000 mL | Freq: Once | OROMUCOSAL | Status: AC

## 2023-12-25 MED ORDER — HEPARIN 6000 UNIT IRRIGATION SOLUTION
Status: DC | PRN
  Administered 2023-12-25: 15:00:00 1

## 2023-12-25 MED ORDER — HEMOSTATIC AGENTS (NO CHARGE) OPTIME
TOPICAL | Status: DC | PRN
  Administered 2023-12-25: 16:00:00 1 via TOPICAL

## 2023-12-25 MED ORDER — HEPARIN SODIUM (PORCINE) 1000 UNIT/ML IJ SOLN
INTRAMUSCULAR | Status: DC | PRN
  Administered 2023-12-25: 16:00:00 5000 [IU] via INTRAVENOUS

## 2023-12-25 MED ORDER — PROTAMINE SULFATE 10 MG/ML IV SOLN
INTRAVENOUS | Status: DC | PRN
  Administered 2023-12-25: 16:00:00 20 mg via INTRAVENOUS

## 2023-12-25 MED ORDER — ACETAMINOPHEN 10 MG/ML IV SOLN
1000.0000 mg | Freq: Once | INTRAVENOUS | Status: DC | PRN
  Administered 2023-12-25: 17:00:00 1000 mg via INTRAVENOUS

## 2023-12-25 MED ORDER — ACETAMINOPHEN 10 MG/ML IV SOLN
INTRAVENOUS | Status: AC
  Filled 2023-12-25: qty 100

## 2023-12-25 MED ORDER — CHLORHEXIDINE GLUCONATE 0.12 % MT SOLN
15.0000 mL | Freq: Once | OROMUCOSAL | Status: AC
  Administered 2023-12-25: 12:00:00 15 mL via OROMUCOSAL

## 2023-12-25 MED ORDER — 0.9 % SODIUM CHLORIDE (POUR BTL) OPTIME
TOPICAL | Status: DC | PRN
  Administered 2023-12-25: 15:00:00 1000 mL

## 2023-12-25 MED ORDER — CHLORHEXIDINE GLUCONATE 0.12 % MT SOLN
OROMUCOSAL | Status: AC
  Filled 2023-12-25: qty 15

## 2023-12-25 MED ORDER — LIDOCAINE HCL (CARDIAC) PF 100 MG/5ML IV SOSY: PREFILLED_SYRINGE | INTRAVENOUS | Status: DC | PRN

## 2023-12-25 MED ORDER — LIDOCAINE 2% (20 MG/ML) 5 ML SYRINGE
INTRAMUSCULAR | Status: DC | PRN
  Administered 2023-12-25: 15:00:00 60 mg via INTRAVENOUS

## 2023-12-25 MED ORDER — LIDOCAINE 2% (20 MG/ML) 5 ML SYRINGE
INTRAMUSCULAR | Status: AC
  Filled 2023-12-25: qty 5

## 2023-12-25 MED ORDER — MIDAZOLAM HCL (PF) 2 MG/2ML IJ SOLN
INTRAMUSCULAR | Status: DC | PRN
  Administered 2023-12-25: 15:00:00 2 mg via INTRAVENOUS

## 2023-12-25 MED ORDER — FENTANYL CITRATE (PF) 100 MCG/2ML IJ SOLN
INTRAMUSCULAR | Status: AC
  Filled 2023-12-25: qty 2

## 2023-12-25 MED ORDER — FENTANYL CITRATE (PF) 100 MCG/2ML IJ SOLN
25.0000 ug | INTRAMUSCULAR | Status: DC | PRN
  Administered 2023-12-25 (×2): 50 ug via INTRAVENOUS

## 2023-12-25 MED ORDER — HEPARIN 6000 UNIT IRRIGATION SOLUTION
Status: AC
  Filled 2023-12-25: qty 500

## 2023-12-25 MED ORDER — CEFAZOLIN SODIUM-DEXTROSE 2-4 GM/100ML-% IV SOLN
2.0000 g | INTRAVENOUS | Status: AC
  Administered 2023-12-25: 15:00:00 2 g via INTRAVENOUS
  Filled 2023-12-25: qty 100

## 2023-12-25 MED ORDER — SODIUM CHLORIDE 0.9 % IV SOLN: INTRAVENOUS | Status: DC

## 2023-12-25 MED ORDER — PROPOFOL 10 MG/ML IV BOLUS
INTRAVENOUS | Status: DC | PRN
  Administered 2023-12-25: 15:00:00 130 mg via INTRAVENOUS

## 2023-12-25 MED ORDER — OXYCODONE HCL 5 MG/5ML PO SOLN: 5.0000 mg | Freq: Once | ORAL | Status: AC | PRN

## 2023-12-25 MED ORDER — OXYCODONE HCL 5 MG PO TABS
ORAL_TABLET | ORAL | Status: AC
  Filled 2023-12-25: qty 1

## 2023-12-25 MED ADMIN — Dexamethasone Sod Phosphate Preservative Free Inj 10 MG/ML: 10 mg | INTRAVENOUS | @ 15:00:00 | NDC 25021005301

## 2023-12-25 SURGICAL SUPPLY — 28 items
ARMBAND PINK RESTRICT EXTREMIT (MISCELLANEOUS) ×1 IMPLANT
BAG COUNTER SPONGE SURGICOUNT (BAG) ×1 IMPLANT
BNDG ELASTIC 4INX 5YD STR LF (GAUZE/BANDAGES/DRESSINGS) IMPLANT
CANISTER SUCTION 3000ML PPV (SUCTIONS) ×1 IMPLANT
CLIP TI MEDIUM 24 (CLIP) ×1 IMPLANT
CLIP TI WIDE RED SMALL 24 (CLIP) ×1 IMPLANT
COVER PROBE W GEL 5X96 (DRAPES) ×1 IMPLANT
DERMABOND ADVANCED .7 DNX12 (GAUZE/BANDAGES/DRESSINGS) ×1 IMPLANT
ELECTRODE REM PT RTRN 9FT ADLT (ELECTROSURGICAL) ×1 IMPLANT
GAUZE 4X4 16PLY ~~LOC~~+RFID DBL (SPONGE) IMPLANT
GLOVE BIO SURGEON STRL SZ7.5 (GLOVE) ×1 IMPLANT
GLOVE BIOGEL PI IND STRL 8 (GLOVE) ×1 IMPLANT
GOWN STRL REUS W/ TWL XL LVL3 (GOWN DISPOSABLE) ×2 IMPLANT
KIT BASIN OR (CUSTOM PROCEDURE TRAY) ×1 IMPLANT
KIT TURNOVER KIT B (KITS) ×1 IMPLANT
LOOP VESSEL MINI RED (MISCELLANEOUS) IMPLANT
PACK CV ACCESS (CUSTOM PROCEDURE TRAY) ×1 IMPLANT
PAD ARMBOARD POSITIONER FOAM (MISCELLANEOUS) ×2 IMPLANT
POWDER SURGICEL 3.0 GRAM (HEMOSTASIS) IMPLANT
SOLN 0.9% NACL POUR BTL 1000ML (IV SOLUTION) ×1 IMPLANT
SOLN STERILE WATER BTL 1000 ML (IV SOLUTION) ×1 IMPLANT
SUT MNCRL AB 4-0 PS2 18 (SUTURE) ×1 IMPLANT
SUT PROLENE 6 0 BV (SUTURE) ×1 IMPLANT
SUT SILK 2 0 SH (SUTURE) IMPLANT
SUT VIC AB 2-0 CT1 TAPERPNT 27 (SUTURE) ×1 IMPLANT
SUT VIC AB 3-0 SH 27X BRD (SUTURE) ×2 IMPLANT
TOWEL GREEN STERILE (TOWEL DISPOSABLE) ×1 IMPLANT
UNDERPAD 30X36 HEAVY ABSORB (UNDERPADS AND DIAPERS) ×1 IMPLANT

## 2023-12-25 NOTE — Anesthesia Preprocedure Evaluation (Addendum)
 Anesthesia Evaluation  Patient identified by MRN, date of birth, ID band Patient awake    Reviewed: Allergy & Precautions, NPO status , Patient's Chart, lab work & pertinent test results  History of Anesthesia Complications Negative for: history of anesthetic complications  Airway Mallampati: II  TM Distance: >3 FB Neck ROM: Full    Dental  (+) Dental Advisory Given, Teeth Intact   Pulmonary neg shortness of breath, neg sleep apnea, neg COPD, neg recent URI   breath sounds clear to auscultation       Cardiovascular hypertension, Pt. on medications and Pt. on home beta blockers (-) angina +CHF  (-) Past MI  Rhythm:Regular  1. Left ventricular ejection fraction, by estimation, is 40 to 45%. The  left ventricle has mildly decreased function. The left ventricle  demonstrates regional wall motion abnormalities (see scoring  diagram/findings for description). Apical akinesis.  There is mild left ventricular hypertrophy. Left ventricular diastolic  parameters are indeterminate.   2. Right ventricular systolic function is normal. The right ventricular  size is normal. There is moderately elevated pulmonary artery systolic  pressure. The estimated right ventricular systolic pressure is 45.2 mmHg.   3. Left atrial size was mildly dilated.   4. Moderate pleural effusion in the left lateral region.   5. The mitral valve is normal in structure. Trivial mitral valve  regurgitation. No evidence of mitral stenosis.   6. The aortic valve is tricuspid. Aortic valve regurgitation is mild. No  aortic stenosis is present.   7. The inferior vena cava is normal in size with greater than 50%  respiratory variability, suggesting right atrial pressure of 3 mmHg.     Neuro/Psych Seizures -, Well Controlled,   negative psych ROS   GI/Hepatic negative GI ROS, Neg liver ROS,,,  Endo/Other  diabetes    Renal/GU ESRF and DialysisRenal diseaseLab  Results      Component                Value               Date                      NA                       137                 12/25/2023                K                        5.2 (H)             12/25/2023                CO2                      24                  11/03/2023                GLUCOSE                  131 (H)             12/25/2023                BUN  73 (H)              12/25/2023                CREATININE               4.90 (H)            12/25/2023                CALCIUM                   8.9                 11/03/2023                GFR                      6.05 (LL)           04/02/2023                EGFR                     15 (L)              06/08/2021                GFRNONAA                 6 (L)               11/03/2023                Musculoskeletal   Abdominal   Peds  Hematology negative hematology ROS (+) Lab Results      Component                Value               Date                      WBC                      6.9                 11/03/2023                HGB                      15.0                12/25/2023                HCT                      44.0                12/25/2023                MCV                      88.9                11/03/2023                PLT                      224  11/03/2023              Anesthesia Other Findings   Reproductive/Obstetrics                              Anesthesia Physical Anesthesia Plan  ASA: 3  Anesthesia Plan: MAC   Post-op Pain Management:    Induction: Intravenous  PONV Risk Score and Plan: 2 and Ondansetron  and Dexamethasone   Airway Management Planned: LMA and Oral ETT  Additional Equipment: None  Intra-op Plan:   Post-operative Plan: Extubation in OR  Informed Consent: I have reviewed the patients History and Physical, chart, labs and discussed the procedure including the risks, benefits and alternatives for the  proposed anesthesia with the patient or authorized representative who has indicated his/her understanding and acceptance.     Dental advisory given  Plan Discussed with: CRNA  Anesthesia Plan Comments:          Anesthesia Quick Evaluation

## 2023-12-25 NOTE — Progress Notes (Signed)
 Spoke with transportation company- transport will be here within 45 minutes.

## 2023-12-25 NOTE — H&P (Signed)
 History and Physical Interval Note:  12/25/2023 12:22 PM  Nohelani Creegan  has presented today for surgery, with the diagnosis of ESRD.  The various methods of treatment have been discussed with the patient and family. After consideration of risks, benefits and other options for treatment, the patient has consented to  Procedures: TRANSPOSITION, VEIN, BASILIC (Left) as a surgical intervention.  The patient's history has been reviewed, patient examined, no change in status, stable for surgery.  I have reviewed the patient's chart and labs.  Questions were answered to the patient's satisfaction.    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  Lonni JINNY Gaskins   POST OPERATIVE OFFICE NOTE       CC:  F/u for surgery   HPI: Alyshia Kernan is a 63 y.o. female who is here for a postop visit.  She recently underwent left first stage basilic vein transposition on 10/28/2023 by Dr. Gaskins.  This was done for permanent dialysis access.  She currently dialyzes through a right IJ TDC.   She returns today for follow-up.  She has no complaints at today's office visit.  She denies any issues with her left arm incisions such as dehiscence, redness, or tenderness.  She denies any symptoms of steal in the left hand such as weakness, numbness, pain, or excessive coldness.     Allergies  No Known Allergies           Current Outpatient Medications  Medication Sig Dispense Refill   Acetaminophen  Extra Strength 500 MG TABS Take 1-2 tablets by mouth every 6 (six) hours as needed.       carvedilol  (COREG ) 6.25 MG tablet Take 1 tablet (6.25 mg total) by mouth 2 (two) times daily with a meal. 180 tablet 0   feeding supplement (ENSURE PLUS HIGH PROTEIN) LIQD Take 237 mLs by mouth 3 (three) times daily between meals.       insulin  aspart (NOVOLOG  FLEXPEN) 100 UNIT/ML FlexPen 0-9 Units, Subcutaneous, 3 times daily with meals CBG < 70: Implement Hypoglycemia measures CBG 70 - 120: 0 units CBG 121 - 150: 1 unit CBG 151 - 200: 2 units  CBG 201 - 250: 3 units CBG 251 - 300: 5 units CBG 301 - 350: 7 units CBG 351 - 400: 9 units CBG > 400: call MD       insulin  degludec (TRESIBA  FLEXTOUCH) 100 UNIT/ML FlexTouch Pen Inject 4 Units into the skin daily.       Insulin  Pen Needle (BD PEN NEEDLE NANO U/F) 32G X 4 MM MISC for insulin  injections qdDx: E11.65 on insulin  100 each 5   levETIRAcetam  (KEPPRA ) 500 MG tablet Take 2 tablets (1,000 mg total) by mouth daily.       multivitamin (RENA-VIT) TABS tablet Take 1 tablet by mouth at bedtime.       ondansetron  (ZOFRAN -ODT) 4 MG disintegrating tablet Take 4 mg by mouth every 8 (eight) hours as needed.          No current facility-administered medications for this visit.         ROS:  See HPI   Physical Exam:   Incision: Left arm incision well-healed without signs of infection Extremities: Palpable left radial pulse.  Left brachiobasilic fistula with great thrill throughout upper arm Neuro: Intact motor and sensation of left hand   Studies: Dialysis duplex (12/11/2023) +--------------------+----------+-----------------+--------+  AVF                PSV (cm/s)Flow Vol (mL/min)Comments  +--------------------+----------+-----------------+--------+  Native artery inflow  224           831                 +--------------------+----------+-----------------+--------+  AVF Anastomosis        551                               +--------------------+----------+-----------------+--------+     +------------+----------+-------------+----------+--------+  OUTFLOW VEINPSV (cm/s)Diameter (cm)Depth (cm)Describe  +------------+----------+-------------+----------+--------+  Prox UA         95        0.62        1.27             +------------+----------+-------------+----------+--------+  Mid UA         162        0.54        0.91             +------------+----------+-------------+----------+--------+  Dist UA        214        0.72        0.66              +------------+----------+-------------+----------+--------+  AC Fossa       620        0.47        0.66             +------------+----------+-------------+----------+--------+          Assessment/Plan:  This is a 63 y.o. female who is here for a post op visit   - The patient recently underwent creation of a left brachiobasilic AV fistula in October for permanent dialysis access.  Her left arm incision is well-healed without signs of infection -Duplex demonstrates a well maturing left brachiobasilic fistula with good size and flow volume of 831 mL/min - She denies any symptoms of steal since surgery such as left hand weakness, numbness, excessive coldness, or pain.  She has a palpable left radial pulse with intact motor and sensation of the left hand -Her left brachiobasilic fistula has a great thrill throughout the upper arm -I have explained to the patient that the next step would be to schedule her second stage surgery including left brachiobasilic fistula transposition and superficialization. I have outlined the steps of a second stage surgery and risks versus benefits including bleeding, infection, and possibility of future revision. The patient claims that she had no idea that she would need a second surgery and is unwilling to commit to another surgery at this time.  She is aware that she cannot dialyze through her fistula at its current state. - She will continue to dialyze through her right IJ TDC.  She will call our office sometime in the near future when she decides if she wants her second stage surgery or not.     Ahmed Holster, PA-C [facility] [phone]     Clinic MD:  Sheree

## 2023-12-25 NOTE — Anesthesia Procedure Notes (Signed)
 Procedure Name: LMA Insertion Date/Time: 12/25/2023 2:55 PM  Performed by: Julien Manus, CRNAPre-anesthesia Checklist: Patient identified, Emergency Drugs available, Suction available and Patient being monitored Patient Re-evaluated:Patient Re-evaluated prior to induction Oxygen Delivery Method: Circle System Utilized Preoxygenation: Pre-oxygenation with 100% oxygen Induction Type: IV induction Ventilation: Mask ventilation without difficulty LMA: LMA inserted LMA Size: 4.0 Number of attempts: 1 Airway Equipment and Method: Bite block Placement Confirmation: positive ETCO2 Tube secured with: Tape Dental Injury: Teeth and Oropharynx as per pre-operative assessment

## 2023-12-25 NOTE — Progress Notes (Signed)
 Dr. Leopoldo made aware of K 5.2. No new orders given.

## 2023-12-25 NOTE — Discharge Instructions (Signed)

## 2023-12-25 NOTE — Transfer of Care (Signed)
 Immediate Anesthesia Transfer of Care Note  Patient: Adlean Force  Procedure(s) Performed: LEFT UPPER EXTREMITY SECOND STAGE BASILIC VEIN TRANSPOSITION (Left: Arm Upper)  Patient Location: PACU  Anesthesia Type:General  Level of Consciousness: drowsy  Airway & Oxygen Therapy: Patient Spontanous Breathing and Patient connected to face mask oxygen  Post-op Assessment: Report given to RN and Post -op Vital signs reviewed and stable  Post vital signs: Reviewed and stable  Last Vitals:  Vitals Value Taken Time  BP 184/86 12/25/23 16:46  Temp    Pulse 67 12/25/23 16:49  Resp 13 12/25/23 16:49  SpO2 100 % 12/25/23 16:49  Vitals shown include unfiled device data.  Last Pain:  Vitals:   12/25/23 1208  TempSrc:   PainSc: 0-No pain         Complications: No notable events documented.

## 2023-12-25 NOTE — Op Note (Signed)
° ° °  OPERATIVE NOTE   DATE: December 25, 2023  PROCEDURE: left second stage basilic vein transposition (brachiobasilic arteriovenous fistula) placement  PRE-OPERATIVE DIAGNOSIS: ESRD  POST-OPERATIVE DIAGNOSIS: same  SURGEON: Lonni DOROTHA Gaskins, MD  ASSISTANT(S): Adina Sender, PA  ANESTHESIA: LMA  ESTIMATED BLOOD LOSS: 100 mL  FINDING(S): The left upper arm basilic vein was mobilized through three skip incisions.  This was transected near the antecubital crease and passed through a new skin tunnel.  A new end-to-end anastomosis was performed.  Great thrill at completion and palpable radial pulse.  SPECIMEN(S):  None  INDICATIONS:   Jill Jefferson is a 63 y.o. female who presents with ESRD and the need for permanent hemodialysis access.  The patient is scheduled for left second stage basilic vein transposition.  The patient is aware the risks include but are not limited to: bleeding, infection, steal syndrome, nerve damage, ischemic monomelic neuropathy, failure to mature, and need for additional procedures.  The patient is aware of the risks of the procedure and elects to proceed forward.   An assistant was needed given the complexity case and also for mobilization of the vein and creating a new anastomosis.  DESCRIPTION: After full informed written consent was obtained from the patient, the patient was brought back to the operating room and placed supine upon the operating table.  Prior to induction, the patient received IV antibiotics.   After obtaining adequate anesthesia, the patient was then prepped and draped in the standard fashion for a left arm access procedure.  I turned my attention first to identifying the patient's brachiobasilic arteriovenous fistula.  Using SonoSite guidance, the location of this fistula was marked out on the skin.    This was an excellent caliber vein.  I made three longitudinal incisions on the medial aspect of the left upper arm.  Through these  incisions I dissected out circumferentially the basilic vein, taking care to protect the nerve.  Once the vein was fully mobilized, all side branches were ligated between 2-0 silk ties.  The vein was marked for orientation.  I then used a curved tunneler to create a subcutaneous tunnel.  The patient was given 5,000 units IV heparin .  The vein was then transected near the antecubital crease.  It was then brought to the previously created tunnel making sure to maintain proper orientation.  A primary anastomosis was then performed between the two cut ends of the vein with a running 6-0 Prolene with the help of my assistant.  Once this was done the clamps were released.  There was excellent flow through the fistula.  Hemostasis was then achieved.  The wound was irrigated.  The incision was closed with a deep layer of 3-0 Vicryl followed by a subcutaneous 4-0 Monocryl and Dermabond.  There were no immediate complications.  COMPLICATIONS: None  CONDITION: Stable  Lonni DOROTHA Gaskins, MD [facility] Office: [phone]  Lonni JINNY Gaskins   12/25/2023, 4:56 PM

## 2023-12-25 NOTE — Progress Notes (Signed)
 Transportation phone number at discharge today is [phone] ext [phone]. Patient will be going back to a facility.

## 2023-12-26 ENCOUNTER — Encounter (HOSPITAL_COMMUNITY): Payer: Self-pay | Admitting: Vascular Surgery

## 2023-12-26 NOTE — Anesthesia Postprocedure Evaluation (Signed)
 Anesthesia Post Note  Patient: Jill Jefferson  Procedure(s) Performed: LEFT UPPER EXTREMITY SECOND STAGE BASILIC VEIN TRANSPOSITION (Left: Arm Upper)     Patient location during evaluation: PACU Anesthesia Type: General Level of consciousness: awake and alert Pain management: pain level controlled Vital Signs Assessment: post-procedure vital signs reviewed and stable Respiratory status: spontaneous breathing, nonlabored ventilation and respiratory function stable Cardiovascular status: blood pressure returned to baseline Postop Assessment: no apparent nausea or vomiting Anesthetic complications: no   No notable events documented.  Last Vitals:  Vitals:   12/25/23 1730 12/25/23 1745  BP: (!) 193/69 (!) 191/76  Pulse: 61 61  Resp: 20 10  Temp:  36.6 C  SpO2: 93% 97%    Last Pain:  Vitals:   12/25/23 1815  TempSrc:   PainSc: 3    Pain Goal:                   Vertell Row

## 2024-01-14 ENCOUNTER — Telehealth: Payer: Self-pay | Admitting: Vascular Surgery

## 2024-01-16 NOTE — Telephone Encounter (Signed)
 Pt appt scheduled.

## 2024-01-17 ENCOUNTER — Other Ambulatory Visit: Payer: Self-pay | Admitting: *Deleted

## 2024-01-17 DIAGNOSIS — N186 End stage renal disease: Secondary | ICD-10-CM

## 2024-01-22 ENCOUNTER — Encounter: Payer: Self-pay | Admitting: Physician Assistant

## 2024-01-22 ENCOUNTER — Ambulatory Visit
Admission: RE | Admit: 2024-01-22 | Discharge: 2024-01-22 | Disposition: A | Discharge status: Home / Self Care | Source: Ambulatory Visit | Attending: Vascular Surgery | Admitting: Vascular Surgery

## 2024-01-22 ENCOUNTER — Ambulatory Visit (INDEPENDENT_AMBULATORY_CARE_PROVIDER_SITE_OTHER): Admitting: Physician Assistant

## 2024-01-22 VITALS — BP 163/70 | HR 69 | Temp 97.7°F | Wt 119.6 lb

## 2024-01-22 DIAGNOSIS — N186 End stage renal disease: Secondary | ICD-10-CM

## 2024-01-22 NOTE — Progress Notes (Signed)
 " POST OPERATIVE OFFICE NOTE    CC:  F/u for surgery  HPI:  This is a 64 y.o. female who is s/p left 2nd stage BVT on 12/25/2023 by Dr. Gretta.   Her 1st stage BVT was 10/28/2023 by Dr. Gretta.  She has a TDC that was placed by IR on 11/03/2023.   Pt states she does not have pain/numbness in the left hand but does have significant swelling in the left hand.  She does have mild swelling in the arm compared to the right but no like her hand.  She states this has been present since surgery.  She does elevate her arm but it does not improve.  She states she did have some drainage from the distal incision but this has resolved.   The pt is on dialysis T/T/S at [facility] location.  She recently moved to [facility] from [place] as her parents retired and moved to [place].     Allergies[1]  Current Outpatient Medications  Medication Sig Dispense Refill   Acetaminophen  Extra Strength 500 MG TABS Take 1-2 tablets by mouth every 6 (six) hours as needed.     carvedilol  (COREG ) 6.25 MG tablet Take 1 tablet (6.25 mg total) by mouth 2 (two) times daily with a meal. 180 tablet 0   feeding supplement (ENSURE PLUS HIGH PROTEIN) LIQD Take 237 mLs by mouth 3 (three) times daily between meals.     HYDROcodone -acetaminophen  (NORCO/VICODIN) 5-325 MG tablet Take 1 tablet by mouth every 6 (six) hours as needed for moderate pain (pain score 4-6). 20 tablet 0   insulin  aspart (NOVOLOG  FLEXPEN) 100 UNIT/ML FlexPen 0-9 Units, Subcutaneous, 3 times daily with meals CBG < 70: Implement Hypoglycemia measures CBG 70 - 120: 0 units CBG 121 - 150: 1 unit CBG 151 - 200: 2 units CBG 201 - 250: 3 units CBG 251 - 300: 5 units CBG 301 - 350: 7 units CBG 351 - 400: 9 units CBG > 400: call MD     insulin  degludec (TRESIBA  FLEXTOUCH) 100 UNIT/ML FlexTouch Pen Inject 4 Units into the skin daily.     Insulin  Pen Needle (BD PEN NEEDLE NANO U/F) 32G X 4 MM MISC for insulin  injections qdDx: E11.65 on insulin  100 each 5   levETIRAcetam  (KEPPRA ) 500 MG  tablet Take 2 tablets (1,000 mg total) by mouth daily.     multivitamin (RENA-VIT) TABS tablet Take 1 tablet by mouth at bedtime.     ondansetron  (ZOFRAN -ODT) 4 MG disintegrating tablet Take 4 mg by mouth every 8 (eight) hours as needed.     No current facility-administered medications for this visit.     ROS:  See HPI  Physical Exam:  Today's Vitals   01/22/24 1356  BP: (!) 163/70  Pulse: 69  Temp: 97.7 F (36.5 C)  TempSrc: Temporal  Weight: 119 lb 9.6 oz (54.3 kg)  PainSc: 0-No pain   Body mass index is 23.36 kg/m.   Incision:  all incisions have healed nicely. Extremities:   There is a palpable left radial pulse.   Left hand is significantly swollen  There is a thrill present.  Access is easily palpable   Dialysis Duplex on 01/22/2024: Patent fistula   Assessment/Plan:  This is a 64 y.o. female who is s/p: left 2nd stage BVT on 12/25/2023 by Dr. Gretta.   Her 1st stage BVT was 10/28/2023 by Dr. Gretta.  She has a TDC that was placed by IR on 11/03/2023.   -the pt does not  have evidence of steal. -pt seen with Dr. Sheree and given swelling of hand, feels this may be component of lymphedema.  He did do massage of the left hand and when she was putting her shirt back on, she felt she could use her hand better.  It was also wrapped with ace wrap.   -she continues to resided at [facility].  Advised to cleanse incisions with soap and water daily.  Massage hand bid, wrap with ace and elevate.  Dr. Sheree did not feel ligating fistula would help with hand swelling.   -will continue to hold using her fistula until she is seen back in 4 weeks for recheck.      Lucie Apt, [facility] [phone]  Clinic MD:  Sheree     [1] No Known Allergies  "

## 2024-02-19 ENCOUNTER — Ambulatory Visit

## 2024-02-25 ENCOUNTER — Ambulatory Visit
